# Patient Record
Sex: Male | Born: 1940 | Race: White | Hispanic: No | Marital: Married | Smoking: Former smoker
Health system: Southern US, Community
[De-identification: ages and names within clinical notes are randomized; demographics above are authoritative.]

## PROBLEM LIST (undated history)

## (undated) DIAGNOSIS — R0683 Snoring: Secondary | ICD-10-CM

## (undated) DIAGNOSIS — I483 Typical atrial flutter: Secondary | ICD-10-CM

## (undated) DIAGNOSIS — I452 Bifascicular block: Secondary | ICD-10-CM

## (undated) DIAGNOSIS — Z87442 Personal history of urinary calculi: Secondary | ICD-10-CM

## (undated) DIAGNOSIS — E663 Overweight: Secondary | ICD-10-CM

## (undated) DIAGNOSIS — I2510 Atherosclerotic heart disease of native coronary artery without angina pectoris: Secondary | ICD-10-CM

## (undated) DIAGNOSIS — Z951 Presence of aortocoronary bypass graft: Secondary | ICD-10-CM

## (undated) DIAGNOSIS — E785 Hyperlipidemia, unspecified: Secondary | ICD-10-CM

## (undated) DIAGNOSIS — I340 Nonrheumatic mitral (valve) insufficiency: Secondary | ICD-10-CM

## (undated) DIAGNOSIS — I351 Nonrheumatic aortic (valve) insufficiency: Secondary | ICD-10-CM

## (undated) DIAGNOSIS — Z9889 Other specified postprocedural states: Secondary | ICD-10-CM

## (undated) DIAGNOSIS — E119 Type 2 diabetes mellitus without complications: Secondary | ICD-10-CM

## (undated) DIAGNOSIS — I350 Nonrheumatic aortic (valve) stenosis: Secondary | ICD-10-CM

## (undated) DIAGNOSIS — I10 Essential (primary) hypertension: Secondary | ICD-10-CM

## (undated) DIAGNOSIS — D509 Iron deficiency anemia, unspecified: Secondary | ICD-10-CM

## (undated) HISTORY — PX: CATARACT EXTRACTION: SUR2

## (undated) HISTORY — DX: Snoring: R06.83

## (undated) HISTORY — DX: Type 2 diabetes mellitus without complications: E11.9

## (undated) HISTORY — DX: Overweight: E66.3

## (undated) HISTORY — DX: Iron deficiency anemia, unspecified: D50.9

## (undated) HISTORY — DX: Atherosclerotic heart disease of native coronary artery without angina pectoris: I25.10

## (undated) HISTORY — DX: Hyperlipidemia, unspecified: E78.5

## (undated) HISTORY — PX: ANKLE SURGERY: SHX546

## (undated) HISTORY — DX: Typical atrial flutter: I48.3

## (undated) HISTORY — PX: CORONARY ANGIOPLASTY: SHX604

## (undated) HISTORY — DX: Essential (primary) hypertension: I10

## (undated) HISTORY — DX: Bifascicular block: I45.2

---

## 2003-02-21 ENCOUNTER — Observation Stay (HOSPITAL_COMMUNITY): Admission: RE | Admit: 2003-02-21 | Discharge: 2003-02-22 | Payer: Self-pay | Admitting: Cardiology

## 2003-02-21 ENCOUNTER — Encounter: Payer: Self-pay | Admitting: Cardiology

## 2003-09-06 ENCOUNTER — Encounter: Admission: RE | Admit: 2003-09-06 | Discharge: 2003-09-06 | Payer: Self-pay | Admitting: Podiatry

## 2004-04-28 ENCOUNTER — Ambulatory Visit: Payer: Self-pay | Admitting: Cardiology

## 2006-06-02 ENCOUNTER — Ambulatory Visit: Payer: Self-pay | Admitting: Cardiology

## 2006-06-06 ENCOUNTER — Encounter: Payer: Self-pay | Admitting: Cardiology

## 2006-06-06 ENCOUNTER — Ambulatory Visit: Payer: Self-pay

## 2006-10-28 ENCOUNTER — Encounter: Payer: Self-pay | Admitting: Cardiology

## 2007-02-17 ENCOUNTER — Ambulatory Visit: Payer: Self-pay | Admitting: Cardiology

## 2008-04-11 ENCOUNTER — Ambulatory Visit: Payer: Self-pay | Admitting: Cardiology

## 2008-04-11 DIAGNOSIS — I2510 Atherosclerotic heart disease of native coronary artery without angina pectoris: Secondary | ICD-10-CM

## 2008-04-11 DIAGNOSIS — Z9861 Coronary angioplasty status: Secondary | ICD-10-CM

## 2008-04-11 DIAGNOSIS — I10 Essential (primary) hypertension: Secondary | ICD-10-CM | POA: Insufficient documentation

## 2008-04-11 DIAGNOSIS — E785 Hyperlipidemia, unspecified: Secondary | ICD-10-CM

## 2008-04-22 ENCOUNTER — Encounter: Payer: Self-pay | Admitting: Cardiology

## 2009-05-30 ENCOUNTER — Ambulatory Visit: Payer: Self-pay | Admitting: Cardiology

## 2009-05-30 DIAGNOSIS — I4510 Unspecified right bundle-branch block: Secondary | ICD-10-CM

## 2009-06-09 ENCOUNTER — Encounter: Payer: Self-pay | Admitting: Cardiology

## 2009-08-07 ENCOUNTER — Encounter: Payer: Self-pay | Admitting: Cardiology

## 2009-08-27 ENCOUNTER — Encounter: Payer: Self-pay | Admitting: Cardiology

## 2009-09-09 ENCOUNTER — Encounter: Payer: Self-pay | Admitting: Cardiology

## 2010-05-29 ENCOUNTER — Ambulatory Visit
Admission: RE | Admit: 2010-05-29 | Discharge: 2010-05-29 | Payer: Self-pay | Source: Home / Self Care | Attending: Cardiology | Admitting: Cardiology

## 2010-06-09 NOTE — Assessment & Plan Note (Signed)
Summary: 1 yr fu per dec reminder-srs   Visit Type:  Follow-up Primary Provider:  Dr. Sherryll Burger  CC:  follow-up visit.  History of Present Illness: the patient is a 70 year old male with a history of coronary artery disease status-post accident x 2 in October of 2004.  He had a normal stress echocardiogram in January of 2008.  He reports no chest pain shortness of breath orthopnea PND.  He reports no palpitations.  He reports occasional dizzy spells lasting two to 3 seconds.  This occurs rarely.  He reports no presyncope or syncope.Marland Kitchen  He has no other cardiovascular complaints.   Preventive Screening-Counseling & Management  Alcohol-Tobacco     Smoking Status: quit     Year Quit: 1886  Current Medications (verified): 1)  Lipitor 80 Mg Tabs (Atorvastatin Calcium) .... One Tablet P.o. Nightly 2)  Chlorthalidone 25 Mg Tabs (Chlorthalidone) .... One Tablet P.o. Daily 3)  Multivitamin .... One Tablet P.o. Daily 4)  Vitamin C .... One Tablet P.o. Daily 5)  Calcium2 50 Mg .... One Tablet P.o. Daily 6)  Aspirin 81 Mg .... One Tablet P.o. Daily 7)  Plavix 75 Mg Tabs (Clopidogrel Bisulfate) .... One Tablet P.o. Daily 8)  Lisinopril 20 Mg Tabs (Lisinopril) .... One Tablet P.o. Daily 9)  Fish Oil .... As Directed 10)  Carvedilol 6.25 Mg Tabs (Carvedilol) .... One Tablet P.o. B.i.d. 11)  Pa Resveratrol 250 Mg Caps (Resveratrol) .... Take 1 Tablet By Mouth Once A Day 12)  Krill Oil 1000 Mg Caps (Krill Oil) .... Take 1 Tablet By Mouth Once A Day 13)  Vitamin B-12 2500 Mcg Subl (Cyanocobalamin) .... Take 1 Tablet By Mouth Once A Day 14)  Vitamin D3 1000 Unit Caps (Cholecalciferol) .... Take 5 Tablet By Mouth Once A Day 15)  Garlic Oil 1000 Mg Caps (Garlic) .... Take 1 Tablet By Mouth Once A Day 16)  Cyto-Q Max 100 Mg/ml Liqd (Ubiquinol Liposomal) .... Take 1 Tablet By Mouth Once A Day  Allergies (verified): No Known Drug Allergies  Comments:  Nurse/Medical Assistant: The patient's medications and  allergies were reviewed with the patient and were updated in the Medication and Allergy Lists. List reviewed.  Past History:  Past Medical History: Last updated: 05/07/2008 coronary artery disease status post Taxus stent x 2 in Cypher drug alluding stent to the right coronary artery in October 2004 residual disease of the circumflex coronary artery disease at 70% normal LV systolic function negative exercise stress echocardiographic study January 2008 dyslipidemia hypertension  Family History: Last updated: May 07, 2008 father died from MI at age 28 mother is alive and well brother died at age 68 from leukemia  Social History: Last updated: 07-May-2008 patient is married he is a remote tobacco history.  He quit 15 years ago.  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  Risk Factors: Smoking Status: quit (05/30/2009)  Social History: Smoking Status:  quit  Review of Systems  The patient denies fatigue, malaise, fever, weight gain/loss, vision loss, decreased hearing, hoarseness, chest pain, palpitations, shortness of breath, prolonged cough, wheezing, sleep apnea, coughing up blood, abdominal pain, blood in stool, nausea, vomiting, diarrhea, heartburn, incontinence, blood in urine, muscle weakness, joint pain, leg swelling, rash, skin lesions, headache, fainting, dizziness, depression, anxiety, enlarged lymph nodes, easy bruising or bleeding, and environmental allergies.    Vital Signs:  Patient profile:   70 year old male Height:      71 inches Weight:      245 pounds BMI:  34.29 Pulse rate:   84 / minute BP sitting:   131 / 84  (left arm) Cuff size:   large  Vitals Entered By: Carlye Grippe (May 30, 2009 9:44 AM)  Nutrition Counseling: Patient's BMI is greater than 25 and therefore counseled on weight management options. CC: follow-up visit   Physical Exam  Additional Exam:  General: Well-developed, well-nourished in no  distress head: Normocephalic and atraumatic eyes PERRLA/EOMI intact, conjunctiva and lids normal nose: No deformity or lesions mouth normal dentition, normal posterior pharynx neck: Supple, no JVD.  No masses, thyromegaly or abnormal cervical nodes lungs: Normal breath sounds bilaterally without wheezing.  Normal percussion heart: regular rate and rhythm with normal S1 and S2, no S3 or S4.  PMI is normal.  No pathological murmurs abdomen: Normal bowel sounds, abdomen is soft and nontender without masses, organomegaly or hernias noted.  No hepatosplenomegaly musculoskeletal: Back normal, normal gait muscle strength and tone normal pulsus: Pulse is normal in all 4 extremities Extremities: No peripheral pitting edema neurologic: Alert and oriented x 3 skin: Intact without lesions or rashes cervical nodes: No significant adenopathy psychologic: Normal affect    EKG  Procedure date:  05/30/2009  Findings:      sinus rhythm left axis deviation and right bundle branch block.  Possible old inferior wall infarct pattern.  Heart rate 82 beats/min.  Impression & Recommendations:  Problem # 1:  PERCUTANEOUS TRANSLUMINAL CORONARY ANGIOPLASTY, HX OF (ICD-V45.82) the patient has no recurrent chest pain.  He will continue with risk factor modification.  He will also continue on aspirin and Plavix.  I recommended a repeat stress echocardiogram in one year.  Problem # 2:  DYSLIPIDEMIA (ICD-272.4) followed by the patient's primary care physician.  The patient on high-dose Lipitor. His updated medication list for this problem includes:    Lipitor 80 Mg Tabs (Atorvastatin calcium) ..... One tablet p.o. nightly  Problem # 3:  ESSENTIAL HYPERTENSION, BENIGN (ICD-401.1) blood pressures well controlled on his medical regimen.  I have made no changes. The following medications were removed from the medication list:    Metoprolol Succinate 25 Mg Xr24h-tab (Metoprolol succinate)    Metoprolol Tartrate 25  Mg Tabs (Metoprolol tartrate) .Marland Kitchen... Take 1/2 tablet by mouth twice a day His updated medication list for this problem includes:    Chlorthalidone 25 Mg Tabs (Chlorthalidone) ..... One tablet p.o. daily    Lisinopril 20 Mg Tabs (Lisinopril) ..... One tablet p.o. daily    Carvedilol 6.25 Mg Tabs (Carvedilol) ..... One tablet p.o. b.i.d.  Orders: EKG w/ Interpretation (93000)  Problem # 4:  RBBB (ICD-426.4) Assessment: Unchanged  The following medications were removed from the medication list:    Metoprolol Succinate 25 Mg Xr24h-tab (Metoprolol succinate)    Metoprolol Tartrate 25 Mg Tabs (Metoprolol tartrate) .Marland Kitchen... Take 1/2 tablet by mouth twice a day His updated medication list for this problem includes:    Plavix 75 Mg Tabs (Clopidogrel bisulfate) ..... One tablet p.o. daily    Lisinopril 20 Mg Tabs (Lisinopril) ..... One tablet p.o. daily    Carvedilol 6.25 Mg Tabs (Carvedilol) ..... One tablet p.o. b.i.d.  Patient Instructions: 1)  Your physician recommends that you continue on your current medications as directed. Please refer to the Current Medication list given to you today. 2)  Follow up in  1 year.

## 2010-06-11 NOTE — Assessment & Plan Note (Signed)
Summary: 1 yr ful   Visit Type:  Follow-up Primary Provider:  Dr. Sherryll Burger   History of Present Illness: the patient is a 70 year old male with prior history of coronary artery disease. The patient underwent stress testing in his primary care physician's office approximately 6 months ago. Had an echocardiogram done which was within normal limits. Had Cardiolite stress study with normal ejection fraction and no ischemia. The patient has been doing well. He reports no chest pain shortness of breath orthopnea PND. Has no palpitations or syncope. He still taking revasterol, but told him that the research behind this is fraudulent. We will decide what is going to continue to take that. From a cardiovascular standpoint dissection quite well.  Preventive Screening-Counseling & Management  Alcohol-Tobacco     Smoking Status: quit     Year Quit: 1984  Current Medications (verified): 1)  Lipitor 80 Mg Tabs (Atorvastatin Calcium) .... One Tablet P.o. Nightly 2)  Chlorthalidone 25 Mg Tabs (Chlorthalidone) .... One Tablet P.o. Daily 3)  Multivitamin .... (Foward Plus)one Tablet P.o. Daily 4)  Aspirin 81 Mg .... One Tablet P.o. Daily 5)  Plavix 75 Mg Tabs (Clopidogrel Bisulfate) .... One Tablet P.o. Daily 6)  Lisinopril 20 Mg Tabs (Lisinopril) .... One Tablet P.o. Daily 7)  Fish Oil 1000 Mg Caps (Omega-3 Fatty Acids) .... Take 1 Tablet By Mouth Once A Day 8)  Carvedilol 6.25 Mg Tabs (Carvedilol) .... One Tablet P.o. B.i.d. 9)  Resveratrol 100 Mg Caps (Resveratrol) .... Take 1 Tablet By Mouth Once A Day 10)  Krill Oil 1000 Mg Caps (Krill Oil) .... Take 1 Tablet By Mouth Once A Day 11)  Vitamin D3 5000 Unit Caps (Cholecalciferol) .... Take 1 Tablet By Mouth Once A Day 12)  Garlic Oil 1000 Mg Caps (Garlic) .... Take 1 Tablet By Mouth Once A Day 13)  Coq10 100 Mg Caps (Coenzyme Q10) .... Take 1 Tablet By Mouth Once A Day 14)  Glucose Essentials .... Take 1 Tablet By Mouth Three Times A Day 15)  Chelated  Potassium 99 Mg Tabs (Potassium) .... Take 1 Tablet By Mouth Once A Day 16)  Ubiquinol  Powd (Ubiquinol) .... 100mg  Take 1 Tablet By Mouth Once A Day 17)  Astaxanthin 4 Mg Caps (Astaxanthin) .... 5mg  Take 1 Tablet By Mouth Once A Day 18)  Cinnamon 500 Mg Caps (Cinnamon) .... Take 1 Tablet By Mouth Once A Day  Allergies (verified): No Known Drug Allergies  Comments:  Nurse/Medical Assistant: The patient's medication list and allergies were reviewed with the patient and were updated in the Medication and Allergy Lists.  Past History:  Past Medical History: Last updated: Apr 13, 2008 coronary artery disease status post Taxus stent x 2 in Cypher drug alluding stent to the right coronary artery in October 2004 residual disease of the circumflex coronary artery disease at 70% normal LV systolic function negative exercise stress echocardiographic study January 2008 dyslipidemia hypertension  Family History: Last updated: Apr 13, 2008 father died from MI at age 42 mother is alive and well brother died at age 15 from leukemia  Social History: Last updated: 13-Apr-2008 patient is married he is a remote tobacco history.  He quit 15 years ago.  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    Vital Signs:  Patient profile:   70 year old male Height:      71 inches Weight:      250 pounds BMI:      34.99 Pulse rate:   86 / minute BP sitting:   118 / 81  (left arm) Cuff size:   large  Vitals Entered By: Carlye Grippe (May 29, 2010 1:56 PM)  Nutrition Counseling: Patient's BMI is greater than 25 and therefore counseled on weight management options.  Physical Exam  Additional Exam:  General: Well-developed, well-nourished in no distress head: Normocephalic and atraumatic eyes PERRLA/EOMI intact, conjunctiva and lids normal nose: No deformity or lesions mouth normal dentition, normal posterior pharynx neck: Supple, no JVD.  No masses, thyromegaly or abnormal cervical nodes lungs: Normal breath sounds bilaterally without wheezing.  Normal percussion heart: regular rate and rhythm with normal S1 and S2, no S3 or S4.  PMI is normal.  No pathological murmurs abdomen: Normal bowel sounds, abdomen is soft and nontender without masses, organomegaly or hernias noted.  No hepatosplenomegaly musculoskeletal: Back normal, normal gait muscle strength and tone normal pulsus: Pulse is normal in all 4 extremities Extremities: No peripheral pitting edema neurologic: Alert and oriented x 3 skin: Intact without lesions or rashes cervical nodes: No significant adenopathy psychologic: Normal affect    Impression & Recommendations:  Problem # 1:  PERCUTANEOUS TRANSLUMINAL CORONARY ANGIOPLASTY, HX OF (ICD-V45.82) prior coronary intervention many years ago. The patient has no recurrent chest pain. Had a stress test 6 months ago showed no ischemia. Continue his medical regimen. The patient will remain iindefinitely on Plavix  Problem # 2:  DYSLIPIDEMIA (ICD-272.4) the patient is on high-dose statin therapy. His lipid panel is followed by primary care physician. His updated medication list for this problem includes:    Lipitor 80 Mg Tabs (Atorvastatin calcium) ..... One tablet p.o. nightly  Problem # 3:  RBBB (ICD-426.4) Assessment: Comment Only stable. His updated medication list for this  problem includes:    Plavix 75 Mg Tabs (Clopidogrel bisulfate) ..... One tablet p.o. daily    Lisinopril 20 Mg Tabs (Lisinopril) ..... One tablet p.o. daily    Carvedilol 6.25 Mg Tabs (Carvedilol) ..... One tablet p.o. b.i.d.  Patient Instructions: 1)  Your physician recommends that you continue on your current medications as directed. Please refer to the Current Medication list given to you today. 2)  Follow up in  1 year

## 2010-07-06 ENCOUNTER — Encounter (HOSPITAL_COMMUNITY): Payer: Medicare Other | Attending: Ophthalmology

## 2010-07-06 ENCOUNTER — Other Ambulatory Visit: Payer: Self-pay | Admitting: Ophthalmology

## 2010-07-06 DIAGNOSIS — Z01812 Encounter for preprocedural laboratory examination: Secondary | ICD-10-CM | POA: Insufficient documentation

## 2010-07-06 DIAGNOSIS — Z01810 Encounter for preprocedural cardiovascular examination: Secondary | ICD-10-CM | POA: Insufficient documentation

## 2010-07-06 LAB — BASIC METABOLIC PANEL
BUN: 19 mg/dL (ref 6–23)
Chloride: 100 mEq/L (ref 96–112)
Potassium: 3.8 mEq/L (ref 3.5–5.1)

## 2010-07-06 LAB — HEMOGLOBIN AND HEMATOCRIT, BLOOD: HCT: 43.3 % (ref 39.0–52.0)

## 2010-07-16 ENCOUNTER — Encounter (HOSPITAL_COMMUNITY): Payer: Medicare Other

## 2010-07-20 ENCOUNTER — Ambulatory Visit (HOSPITAL_COMMUNITY)
Admission: RE | Admit: 2010-07-20 | Discharge: 2010-07-20 | Disposition: A | Discharge status: Home / Self Care | Payer: Medicare Other | Source: Ambulatory Visit | Attending: Ophthalmology | Admitting: Ophthalmology

## 2010-07-20 DIAGNOSIS — Z01812 Encounter for preprocedural laboratory examination: Secondary | ICD-10-CM | POA: Insufficient documentation

## 2010-07-20 DIAGNOSIS — Z01818 Encounter for other preprocedural examination: Secondary | ICD-10-CM | POA: Insufficient documentation

## 2010-07-20 DIAGNOSIS — Z7982 Long term (current) use of aspirin: Secondary | ICD-10-CM | POA: Insufficient documentation

## 2010-07-20 DIAGNOSIS — Z79899 Other long term (current) drug therapy: Secondary | ICD-10-CM | POA: Insufficient documentation

## 2010-07-20 DIAGNOSIS — H2510 Age-related nuclear cataract, unspecified eye: Secondary | ICD-10-CM | POA: Insufficient documentation

## 2010-07-20 DIAGNOSIS — I10 Essential (primary) hypertension: Secondary | ICD-10-CM | POA: Insufficient documentation

## 2010-07-20 LAB — GLUCOSE, CAPILLARY: Glucose-Capillary: 137 mg/dL — ABNORMAL HIGH (ref 70–99)

## 2010-09-22 NOTE — Assessment & Plan Note (Signed)
[facility]                          [facility] OFFICE NOTE   NAME:Christian Rasmussen, Christian Rasmussen                          MRN:          [id number]  DATE:02/17/2007                            DOB:          1940-08-13    HISTORY OF PRESENT ILLNESS:  The patient is a middle-aged male with a  history of coronary artery disease status post stent placement to right  coronary artery with 2 Taxus stents and a Cypher drug-eluting stent in  October 2004.  He has residual disease of the circumflex coronary  artery.  The patient underwent a stress echocardiographic study earlier  this year which was within normal limits.  The patient stated that he is  doing well.  He has no shortness of breath, orthopnea, PND, has no  palpitations or syncope.  He does remain hypertensive and last office  visit, we added lisinopril 20 mg a day to his medical regimen.  He also  had concerns about his elevated CRP.  This was initially measured by  [facility] at 7.  We then repeated his CRP, and it was now measured at  0.47.  I suspect this was a superius result.   His lipid panel has been within target range.   MEDICATIONS:  1. Metoprolol ER 25 mg p.o. daily.  2. Lipitor 80 mg p.o. daily.  3. Fish oil 1 g p.o. daily.  4. Chlorthalidone 25 mg p.o. daily.  5. Multivitamin daily.  6. Vitamin C.  7. Calcium.  8. Aspirin 81 mg p.o. daily.  9. Plavix 75 mg p.o. daily.  10.Lisinopril 20 mg 1/2 tab p.o. daily.  11.Co-Enzyme Q10, 200 mg p.o. daily.   PHYSICAL EXAMINATION:  VITAL SIGNS:  Blood pressure is 150/76; heart  rate is 69; weight is 240 pounds.  NECK:  Normal carotid upstroke.  No carotid bruits.  LUNGS:  Clear breath sounds bilaterally.  HEART:  Regular rate and rhythm.  Normal S1, S2.  No murmurs, rubs, or  gallops.  ABDOMEN:  Soft, nontender, no rebound or guarding.  Good bowel sounds.  EXTREMITIES:  No cyanosis, clubbing, or edema.  NEUROLOGIC:  The patient is alert, oriented, and  grossly nonfocal.   PROBLEM LIST:  1. Coronary artery diseased.      a.     Status post Taxus stent x2 and Cypher drug-eluting stent to       right coronary artery in October 2004.      b.     Residual disease of the circumflex coronary artery disease       70%.      c.     Normal left ventricular systolic function.      d.     Negative exercise stress echocardiographic study, January       2008.  2. Dyslipidemia with good control.  3. Hypertension, poorly controlled.   PLAN:  1. The patient's lisinopril will be increased up from 10 to 20 mg p.o.      daily.  2. We reviewed his CRP, and this is within normal range.  3. The patient will  follow up with [place] in 1 year.  I have refilled his      metoprolol ER today.     Learta Codding, MD,FACC  Electronically Signed    GED/MedQ  DD: 02/17/2007  DT: 02/18/2007  Job #: [id number]   cc:   Kirstie Peri, MD

## 2010-09-25 NOTE — Assessment & Plan Note (Signed)
[facility]                          [facility] NOTE   NAME:Christian Rasmussen, Christian Rasmussen                          MRN:          [id number]  DATE:06/02/2006                            DOB:          11-19-1940    REFERRING PHYSICIAN:  Kirstie Peri, MD   HISTORY OF PRESENT ILLNESS:  The patient is a white male with known  coronary artery disease. The patient is status post stent placement to  the right coronary artery with 2 TAXUS stents and a Cypher drug-eluting  stent October 2004. The patient also has residual disease in the  circumflex coronary artery approximately 70%. The patient has been doing  well. He reports no substernal chest pain, shortness of breath,  orthopnea or PND. He has gained some weight of approximately 10 pounds  since last visit. His blood pressure also has been poorly controlled and  is currently 160/92. He denies any palpitations or syncope.   MEDICATIONS:  1. Toprol XL 25 mg p.o. daily.  2. Lipitor 80 mg a day.  3. Fish oil 1 gram daily.  4. Chlorthalidone 25 mg p.o. daily.  5. Multivitamin.  6. Vitamin C.  7. Calcium.  8. Aspirin 81 mg a day.  9. Plavix 75 mg p.o. daily.   PHYSICAL EXAMINATION:  VITAL SIGNS: Blood pressure 160/92, heart rate  68, weight is 245 pounds.  NECK: Normal carotid upstroke. No carotid bruits.  LUNGS:  Clear breath sounds bilaterally.  HEART: Regular rate and rhythm. Normal S1, S2. No murmur, rubs or  gallops.  ABDOMEN: Soft.  EXTREMITIES: No cyanosis, clubbing or edema.  NEURO: The patient is alert, oriented and grossly nonfocal.   PROBLEM LIST:  1. Coronary artery disease.      a.     Status post TAXUS stent x2 and Cypher drug-eluting stent to       right coronary artery October 2004.      b.     Residual disease circumflex coronary artery 70%.      c.     Normal left ventricular systolic function.  2. Dyslipidemia, but under good control. Total cholesterol 120, LDL is      66.  3.  Hypertension, poorly controlled.   PLAN:  1. The patient has been given lisinopril 10 mg a day to add to his      antihypertensive regimen.  2. A BMET will be drawn in 1 week.  3. The patient will also switch to generic Zocor. I have given a      prescription for simvastatin 80 mg p.o. daily.  4. It has been 3 years since the patient had his last ischemia      evaluation. I have rescheduled him for stress echocardiographic      study in our [place] office.  5. The patient will followup with me in 6 months.     Learta Codding, MD,FACC  Electronically Signed    GED/MedQ  DD: 06/02/2006  DT: 06/02/2006  Job #: [id number]   cc:   Kirstie Peri, MD

## 2010-09-25 NOTE — H&P (Signed)
NAME:  Christian Rasmussen, Christian Rasmussen                            ACCOUNT NO.:  [id number]   MEDICAL RECORD NO.:  [id number]                   PATIENT TYPE:  OIB   LOCATION:                                       FACILITY:  [facility]   PHYSICIAN:  Learta Codding, M.D.                 DATE OF BIRTH:  1941-04-30   DATE OF ADMISSION:  02/21/2003  DATE OF DISCHARGE:                                HISTORY & PHYSICAL   CHIEF COMPLAINT:  Chest pain.   HISTORY OF PRESENT ILLNESS:  This is a pleasant 70 year old male who was  referred to Dr. Andee Lineman from Dr. Sherryll Burger in [place], [place], after the  patient had an abnormal Cardiolite stress test performed on February 20, 2003.  The patient had been experiencing intermittent exertional chest pain  for approximately one month, which was consistent with angina.  The patient  has significant risk factors for coronary artery disease, including a  positive family history of hypertension and elevated lipids.  The patient  was seen in the office today by myself and Dr. Andee Lineman.  It was felt that  cardiac catheterization was indicated.  The risks and benefits were  discussed with the patient and his wife and they wanted to proceed.   PAST MEDICAL HISTORY:  The patient has a history of  hypertension and  elevated cholesterol levels.   ALLERGIES:  No known drug allergies.   CURRENT MEDICATIONS:  1. Verapamil SR 240 mg daily.  2. Chlorthalidone 25 mg daily.  3. Lipitor 10 mg at bedtime.  4. Enteric-coated aspirin 325 mg daily.  5. Nitroglycerin p.r.n. for chest pain.   FAMILY HISTORY:  The patient's father died at age 56 from an MI.  His mother  is alive and well.  He has a brother who died at age 5 from leukemia.   SOCIAL HISTORY:  The patient is married.  They have children.  He has a  remote tobacco history.  He quit 15 years ago.  He uses alcohol  occasionally.  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   PHYSICAL EXAMINATION:  GENERAL APPEARANCE:  A pleasant,  well-developed, well-  nourished, 70 year old, white male in no acute distress.  VITAL SIGNS:  Blood pressure 140/90, pulse 78.  HEENT:  Unremarkable, except for some eschar and stitches in his right ear.  He recently had a traumatic injury to his right ear.  NECK:  No bruits.  No jugular venous distention.  HEART:  Regular rate and rhythm without murmur.  LUNGS:  Clear.  ABDOMEN:  Obese, soft, and nontender.  EXTREMITIES:  Pulses intact without edema.  The right groin pulse is strong  without bruit.  SKIN:  Warm and dry.   LABORATORY DATA:  Labs are currently pending.  An EKG performed today  reveals sinus rhythm, rate 78 beats per minute, with mild ST segment  depression and  nonspecific ST-T wave abnormalities.   IMPRESSION:  1. Exertional chest pain.  2. Abnormal exercise Cardiolite performed today.  3. History of hypertension.  4. History of elevated cholesterol.  5. Remote tobacco history.  6. Positive family history of coronary artery disease.  7. Recent trauma to the right ear.   PLAN:  Admit the patient to [facility] for a cardiac  catheterization.  Labs will be performed at [facility]  The patient was  allowed to go home from the office prior to his procedure as it was felt  that his symptoms were stable at this point.  He was given a prescription  for nitroglycerin and placed on Imdur.  Told to report to the emergency room  if he had any significant chest pain prior to his catheterization.      Delton See, P.A. [facility]                  Learta Codding, M.D.    DR/MEDQ  D:  02/20/2003  T:  02/20/2003  Job:  [id number]   cc:   Short Stay Unit   Dr. Janene Madeira Internal Medicine Service

## 2010-09-25 NOTE — Cardiovascular Report (Signed)
NAME:  Christian Rasmussen, Christian Rasmussen                             ACCOUNT NO.:  [id number]   MEDICAL RECORD NO.:  [id number]                   PATIENT TYPE:  OIB   LOCATION:  6526                                 FACILITY:  [facility]   PHYSICIAN:  Charlies Constable, M.D.                  DATE OF BIRTH:  September 05, 1940   DATE OF PROCEDURE:  02/21/2003  DATE OF DISCHARGE:                              CARDIAC CATHETERIZATION   CLINICAL HISTORY:  Christian Rasmussen is 70 years old and has had recent onset of  angina.  Dr. Clelia Croft ordered a Cardiolite scan on him which was done in our  office and was markedly positive showing inferior ischemia.  He was seen by  Dr. Andee Lineman and arrangements were made for him to come in for  catheterization.   PROCEDURE:  The procedure was performed via the right femoral artery using  arterial sheath and 6 French preformed coronary catheters.  A frontal wall  arterial puncture was performed and Omnipaque contrast was used.  After  completion of the diagnostic study, made a decision to proceed with  intervention on the right coronary artery lesions.   The patient was consented and enrolled in the Steeple trial and randomized  to 0.5 mg/kg of Lovenox.  We chose to use Integrilin.  The vessel was  calcified with bends and so we used an AL1 guiding catheter 6 [place] with  side holes.  We used a PT2 light support wire.  We crossed the lesion with  the wire without too much difficulty.  We predilated the lesion in the  proximal and distal right coronary with a 3.0 x 15-mm Quantum Maverick  performing one inflation of 10 atmospheres to 30 seconds at each lesion.  We  then deployed a 3.0 x 20-mm Taxus stent in the distal lesion deploying this  with one inflation of 16 atmospheres for 30 seconds.  We then deployed a 3.5  x 8-mm Cypher stent in the proximal lesion deploying this with one inflation  of 15 atmospheres for 30 seconds.  At this point, we made a decision to  stent the lesion in the mid vessel which  looks somewhat worse.  We used a  3.0 x 16-mm Taxus stent and deployed this with on inflation of 15  atmospheres for 30 seconds.  Repeat diagnostic studies were then performed  through the guiding catheter.  The patient tolerated the procedure well and  left the laboratory in satisfactory condition.  We had some difficulty with  the guiding catheter because it went in further than we would like into the  lesion and we had to adjust the guiding catheter position during the  intervention.   RESULTS:  Left main coronary artery:  The left main coronary had a 30%  distal stenosis.   The left anterior descending artery:  The left anterior descending artery  was heavily calcified.  It gave rise to a large diagonal branch, septal  perforator, small diagonal branch and a third diagonal branch.  There was 60-  70% narrowing in the proximal to mid vessel with moderate calcification.   Circumflex artery:  The circumflex artery gave rise to a marginal branch and  a posterior lateral branch.  There was a long area of diffuse disease in the  mid to distal vessel before the posterior lateral branch with focal  narrowing of 70% in two areas.   Right coronary artery:  The right coronary artery is a moderately large  dominant vessel.  It gave rise to a posterior descending branch and two  posterior lateral branches.  There was 95% stenosis in the proximal vessel.  There was 80% stenosis in the mid vessel.  There was 90% stenosis in the  distal vessel.  There was 50% ostial stenosis in the posterior descending  branch.  There was TIMI-2 flow distally.   LEFT VENTRICULOGRAM:  The left ventriculogram performed in the RAO  projection showed hypokinesis of the inferobasal wall.  The overall wall  motion was good with an estimated ejection fraction of 55%.   Following stenting of the lesion in the distal right coronary artery, the  stenosis improved from 90% to O%.  Following stenting of the lesion in the   proximal right coronary artery, the stenosis improved from 95% to 0%.  Following stenting of the lesion in the mid right coronary artery, the  stenosis improved from 80% to 0%.   CONCLUSIONS:  1. Severe coronary artery disease with 30% narrowing of the left main     coronary artery, 60-70% narrowing in the proximal left anterior     descending, 70% narrowing in the distal circumflex artery, 95%  proximal,     80% mid and 90% distal stenosis in the right coronary artery with     inferior wall hypokinesis.  2. Successful placement of tandem overlying stents in the proximal, mid and     distal right coronary artery with improvement in proximal stenosis from     95% to 0% with a Cypher stent, improvement in the mid stenosis from 80%     to 0% with a Taxus stent and improvement in the distal stenosis from 90%     to 0% with a Taxus stent.   DISPOSITION:  The patient was sent to the post anesthesia unit for further  observation. The lesion in the distal circumflex artery I think we can  manage medically.                                               Charlies Constable, M.D.    BB/MEDQ  D:  02/21/2003  T:  02/22/2003  Job:  [id number]   cc:   Loralyn Freshwater  [facility]  Dept of Radiation [facility] [place], [place] [address]  Fax: [phone]   Cardiopulmonary Lab

## 2010-09-25 NOTE — Discharge Summary (Signed)
NAME:  Christian Rasmussen, Christian Rasmussen                             ACCOUNT NO.:  [id number]   MEDICAL RECORD NO.:  [id number]                   PATIENT TYPE:  OIB   LOCATION:  6526                                 FACILITY:  [facility]   PHYSICIAN:  Christian Rasmussen, M.D.                 DATE OF BIRTH:  Sep 06, 1940   DATE OF ADMISSION:  02/21/2003  DATE OF DISCHARGE:  02/22/2003                           DISCHARGE SUMMARY - REFERRING   PROCEDURE:  Coronary artery stenting (CYPHER), February 21, 2003.   REASON FOR ADMISSION:  Please refer to dictated admission note.   LABORATORY DATA:  WBC 12.8, HGB 13.5, platelets 305,000 at discharge.  Sodium 137, potassium 3.5, glucose 99, BUN 14, creatinine 0.9 at discharge.  Troponin I 0.77 (postop #1).   Admission chest x-ray:  No acute disease.   HOSPITAL COURSE:  The patient presented for elective coronary angiography,  performed by Dr. Charlies Constable (see report for full details), following a  recent abnormal stress test performed at our [facility] office.  The patient  was referred to Dr. Learta Rasmussen and arrangements were made for elective  coronary angiography.   Cardiac catheterization revealed significant three-vessel coronary artery  disease with preserved left ventricular function.   Dr. Charlies Constable proceeded with successful stenting (CYPHER) of three RCA  lesions (95% proximal, 80% mid, 90% distal), all to 0% residual stenosis.  There were no noted complications.   The patient was kept for overnight observation and was hemodynamically  stable the following morning, with no complaint of chest pain.  However,  postop, troponin I (0.77) was elevated.  Dr. Andee Lineman ordered a repeat  troponin I level and, if trending downward, arrangements were made for the  patient to be discharged home.   Dr. Andee Lineman recommended a repeat exercise stress Cardiolite in six months.   MEDICATION ADJUSTMENTS -- THIS ADMISSION:  Substitution of Toprol 25 mg  daily for verapamil,  addition of Plavix (at least six months), up-titration  of Lipitor, addition of fish oil.   DISCHARGE MEDICATIONS:  1. Toprol-XL 25 mg daily.  2. Plavix 75 mg daily (at least six months).  3. Coated aspirin 325 mg daily.  4. Lipitor 80 mg daily.  5. Fish oil 1 g daily.  6. Chlorthalidone 25 mg daily.  7. Nitrostat 0.4 mg p.r.n.   INSTRUCTIONS:  1. Stop verapamil.  2. No heavy lifting/driving x2 days; maintain low-fat/-cholesterol diet and     refer to either the [facility] or Mediterranean Diet for     recommendations.  3. Call office if there is any swelling/bleeding in the groin.   FOLLOWUP:  Arrangements will be made for the patient to follow up with Dr.  Cloyde Reams R. Elise Benne, P.A. in one week at the [facility].   Patient will be followed by the [facility] Team regarding his  enrollment in the STEEPLE Trial.   DISCHARGE  DIAGNOSES:  1. Multivessel coronary artery disease.     a. Status post stent (CYPHER), right coronary artery (sinus ring),        February 21, 2003.     b. Preserved left ventricular function.     c. Abnormal exercise stress Cardiolite (pre-catheterization).     d. Postoperative troponin I elevation.  2. Dyslipidemia.  3. Hypertension.  4. Remote tobacco.      Christian Rasmussen, P.A. [facility]                      Christian Rasmussen, M.D.    GS/MEDQ  D:  02/22/2003  T:  02/22/2003  Job:  [id number]   cc:   [address], [address], [place], [place]  [address] [facility]

## 2010-11-23 ENCOUNTER — Other Ambulatory Visit: Payer: Self-pay | Admitting: *Deleted

## 2010-11-23 MED ORDER — CLOPIDOGREL BISULFATE 75 MG PO TABS: 75.0000 mg | ORAL_TABLET | Freq: Every day | ORAL | Status: DC

## 2010-12-22 ENCOUNTER — Other Ambulatory Visit: Payer: Self-pay | Admitting: *Deleted

## 2010-12-22 MED ORDER — CLOPIDOGREL BISULFATE 75 MG PO TABS: 75.0000 mg | ORAL_TABLET | Freq: Every day | ORAL | Status: DC

## 2011-03-12 ENCOUNTER — Other Ambulatory Visit: Payer: Self-pay | Admitting: *Deleted

## 2011-03-12 MED ORDER — LISINOPRIL 20 MG PO TABS: 20.0000 mg | ORAL_TABLET | Freq: Every day | ORAL | Status: DC

## 2011-03-24 ENCOUNTER — Other Ambulatory Visit: Payer: Self-pay | Admitting: *Deleted

## 2011-03-24 MED ORDER — CARVEDILOL 6.25 MG PO TABS: 6.2500 mg | ORAL_TABLET | Freq: Two times a day (BID) | ORAL | Status: DC

## 2011-07-02 ENCOUNTER — Encounter: Payer: Self-pay | Admitting: Cardiology

## 2011-07-02 ENCOUNTER — Other Ambulatory Visit: Payer: Self-pay | Admitting: Cardiology

## 2011-07-02 ENCOUNTER — Ambulatory Visit (INDEPENDENT_AMBULATORY_CARE_PROVIDER_SITE_OTHER): Payer: Medicare Other | Admitting: Cardiology

## 2011-07-02 VITALS — BP 134/81 | HR 75 | Ht 71.0 in | Wt 244.0 lb

## 2011-07-02 DIAGNOSIS — E041 Nontoxic single thyroid nodule: Secondary | ICD-10-CM

## 2011-07-02 DIAGNOSIS — I2510 Atherosclerotic heart disease of native coronary artery without angina pectoris: Secondary | ICD-10-CM

## 2011-07-02 DIAGNOSIS — I10 Essential (primary) hypertension: Secondary | ICD-10-CM

## 2011-07-02 DIAGNOSIS — I452 Bifascicular block: Secondary | ICD-10-CM

## 2011-07-02 DIAGNOSIS — E785 Hyperlipidemia, unspecified: Secondary | ICD-10-CM

## 2011-07-02 MED ORDER — CARVEDILOL 6.25 MG PO TABS: 6.2500 mg | ORAL_TABLET | Freq: Two times a day (BID) | ORAL | Status: DC

## 2011-07-02 NOTE — Patient Instructions (Signed)
   Thyroid ultrasound  If the results of your test are normal or stable, you will receive a letter.  If they are abnormal, the nurse will contact you by phone. Your physician wants you to follow up in:  1 year.  You will receive a reminder letter in the mail one-two months in advance.  If you don't receive a letter, please call our office to schedule the follow up appointment

## 2011-07-03 ENCOUNTER — Encounter: Payer: Self-pay | Admitting: Cardiology

## 2011-07-03 DIAGNOSIS — I452 Bifascicular block: Secondary | ICD-10-CM | POA: Insufficient documentation

## 2011-07-03 DIAGNOSIS — E785 Hyperlipidemia, unspecified: Secondary | ICD-10-CM | POA: Insufficient documentation

## 2011-07-03 DIAGNOSIS — I2510 Atherosclerotic heart disease of native coronary artery without angina pectoris: Secondary | ICD-10-CM | POA: Insufficient documentation

## 2011-07-03 DIAGNOSIS — I10 Essential (primary) hypertension: Secondary | ICD-10-CM | POA: Insufficient documentation

## 2011-07-03 NOTE — Progress Notes (Signed)
Christian Bottoms, MD, [facility] Board Certified in Adult Cardiovascular Medicine,Internal Medicine and Critical Care Medicine    CC: followup patient with history of coronary artery disease  HPI:  The patient is a 71 year old male with a prior history of percutaneous coronary intervention stenting to the right coronary artery 2004.  Followup Cardiolite in 2010 showed no ischemia.  From a cardiac perspective he has been doing well.  He reports no chest pain.  He has no shortness of breath orthopnea or PND he has no palpitations or syncope.  He actually does not report any cardiovascular-related complications.  He reports that his mother died earlier this year, but has no overt complaints of depression or difficulty handling the situation.  His sleep pattern is normal.  PMH: reviewed and listed in Problem List in Electronic Records (and see below) Past Medical History  Diagnosis Date  . CAD (coronary artery disease)     status post Taxus stent patency of RCA 2004, Cardiolite negative for ischemia in 2010.  Marland Kitchen Dyslipidemia   . Hypertension   . Right bundle branch block (RBBB) with left anterior hemiblock    No past surgical history on file.  Allergies/SH/FHX : available in Electronic Records for review  No Known Allergies History   Social History  . Marital Status: Married    Spouse Name: N/A    Number of Children: N/A  . 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 Smoking status: Former Smoker -- 1.0 packs/day for 28 years    Types: Cigarettes    Quit date: 05/10/1985  . Smokeless tobacco: Never Used  . Alcohol Use: Yes  . Drug Use: Not on file  . Sexually Active: Not on file   Other Topics Concern  . Not on file   Social History Narrative  . No narrative on file   Family History  Problem Relation Age of Onset  . Other Father 73    Died from MI.  . Other Mother     alive & well  . Leukemia Brother  48    died    Medications: Current Outpatient Prescriptions  Medication Sig Dispense Refill  . aspirin 81 MG tablet Take 81 mg by mouth daily.      . Astaxanthin 4 MG CAPS Take 1 tablet by mouth daily.      Marland Kitchen atorvastatin (LIPITOR) 80 MG tablet Take 80 mg by mouth daily.      . carvedilol (COREG) 6.25 MG tablet Take 1 tablet (6.25 mg total) by mouth 2 (two) times daily with a meal.  180 tablet  3  . chlorthalidone (HYGROTON) 25 MG tablet Take 25 mg by mouth daily.      . Cholecalciferol (VITAMIN D-3) 5000 UNITS TABS Take 1 tablet by mouth daily.      . Cinnamon 500 MG capsule Take 500 mg by mouth daily.      . clopidogrel (PLAVIX) 75 MG tablet Take 1 tablet (75 mg total) by mouth daily.  90 tablet  3  . Coenzyme Q10 (CO Q10) 100 MG CAPS Take 1 tablet by mouth daily.      . fish oil-omega-3 fatty acids 1000 MG capsule Take 2 g by mouth daily.      . Garlic Oil 1000 MG CAPS Take by mouth.      Marland Kitchen KRILL OIL 1000 MG CAPS Take by mouth.      Marland Kitchen lisinopril (  PRINIVIL,ZESTRIL) 20 MG tablet Take 1 tablet (20 mg total) by mouth daily.  90 tablet  3  . Multiple Vitamin (MULITIVITAMIN WITH MINERALS) TABS Take 1 tablet by mouth daily.        ROS: No nausea or vomiting. No fever or chills.No melena or hematochezia.No bleeding.No claudication  Physical Exam: BP 134/81  Pulse 75  Ht 5\' 11"  (1.803 m)  Wt 244 lb (110.678 kg)  BMI 34.03 kg/m2  SpO2 96% General:Well-nourished white male Neck:noticed some asymmetry in the thyroid gland left side possibly a little bit larger.  Palpation of the gland reveals a possible nodule on the left side.  No carotid bruits.  JVD is 5-6 cm Lungs:clear breath sounds bilaterally without wheezing Cardiac:regular rate and rhythm with normal S1 and S2 and no murmur rubs or gallops Vascular:no edema.  Normal distal pulses Skin:warm and dry Physcologic:normal affect  12lead ZHY:QMVHQI sinus rhythm, right bundle branch block with left anterior hemiblock.  Otherwise no  acute changes Limited bedside ECHO:N/A   Patient Active Problem List  Diagnoses  . DYSLIPIDEMIA  . CORONARY ATHEROSCLEROSIS NATIVE CORONARY ARTERY  . PERCUTANEOUS TRANSLUMINAL CORONARY ANGIOPLASTY, HX OF-negative Cardiolite study 2010 for ischemia  . Right bundle branch block (RBBB) with left anterior hemiblock  . Hypertension  . CAD (coronary artery disease)status post Taxus stent x3 2004 Possible left sided thyroid nodule    PLAN    I ordered a thyroid ultrasound as there is a possible small nodule on examination of the thyroid gland, left side.  From a cardiovascular perspective the patient is doing well.  He will continue on Plavix.  He has no symptoms and therefore no indication for stress testing which was actually performed in 2010.  Continue risk modification and therapeutic lifestyle changes.

## 2011-07-12 ENCOUNTER — Encounter: Payer: Self-pay | Admitting: *Deleted

## 2011-07-13 ENCOUNTER — Telehealth: Payer: Self-pay | Admitting: *Deleted

## 2011-07-13 NOTE — Telephone Encounter (Signed)
Message left on voice mail - had thyroid scan last week & would like call on cell phone regarding test results.  Returned call - notifed of below.  Will fax info to PMD Sherryll Burger).    Notes Recorded by Hoover Brunette, LPN on 05/15/1094 at 2:50 PM Patient notified of results by letter. ------  Notes Recorded by Peyton Bottoms, MD on 07/09/2011 at 4:32 PM Normal exam no significant nodules

## 2011-07-29 ENCOUNTER — Ambulatory Visit: Payer: No Typology Code available for payment source | Admitting: Cardiology

## 2012-01-19 ENCOUNTER — Other Ambulatory Visit: Payer: Self-pay | Admitting: Cardiology

## 2012-06-07 ENCOUNTER — Other Ambulatory Visit: Payer: Self-pay | Admitting: Cardiology

## 2012-09-07 ENCOUNTER — Encounter: Payer: Self-pay | Admitting: Cardiology

## 2012-09-07 ENCOUNTER — Ambulatory Visit (INDEPENDENT_AMBULATORY_CARE_PROVIDER_SITE_OTHER): Payer: Medicare Other | Admitting: Cardiology

## 2012-09-07 VITALS — BP 155/78 | HR 57 | Ht 71.0 in | Wt 248.0 lb

## 2012-09-07 DIAGNOSIS — I4892 Unspecified atrial flutter: Secondary | ICD-10-CM

## 2012-09-07 DIAGNOSIS — I2510 Atherosclerotic heart disease of native coronary artery without angina pectoris: Secondary | ICD-10-CM

## 2012-09-07 DIAGNOSIS — I452 Bifascicular block: Secondary | ICD-10-CM

## 2012-09-07 DIAGNOSIS — R5383 Other fatigue: Secondary | ICD-10-CM

## 2012-09-07 DIAGNOSIS — R5381 Other malaise: Secondary | ICD-10-CM

## 2012-09-07 MED ORDER — APIXABAN 5 MG PO TABS: 5.0000 mg | ORAL_TABLET | Freq: Two times a day (BID) | ORAL | Status: DC

## 2012-09-07 NOTE — Patient Instructions (Addendum)
Your physician recommends that you schedule a follow-up appointment in: 1 month. Your physician has recommended you make the following change in your medication: Stop aspirin. Stop plavix. Start eliquis 5 mg by mouth twice daily. All other medications will remain the same. Your new prescription has been sent to your pharmacy. Your physician has requested that you have an echocardiogram. Echocardiography is a painless test that uses sound waves to create images of your heart. It provides your doctor with information about the size and shape of your heart and how well your heart's chambers and valves are working. This procedure takes approximately one hour. There are no restrictions for this procedure. Your physician has recommended that you wear a 24 holter monitor. Holter monitors are medical devices that record the heart's electrical activity. Doctors most often use these monitors to diagnose arrhythmias. Arrhythmias are problems with the speed or rhythm of the heartbeat. The monitor is a small, portable device. You can wear one while you do your normal daily activities. This is usually used to diagnose what is causing palpitations/syncope (passing out). Your physician recommends that you return for lab work today at [facility] for [facility] and TSH.

## 2012-09-07 NOTE — Progress Notes (Signed)
HPI The patient presents for evaluation routinely of his known coronary disease. He incidentally is found to be in flutter today. He's never had this diagnosis before. In fact he was at [facility] at the end of last year for an ankle replacement. He had some nonspecific changes on his EKG and I was able to review multiple hospital records through Care Everywhere.  However, there was no mention of flutter. She's never had this diagnosis as far as he knows. He doesn't notice any palpitations and hasn't had any presyncope or syncope. He hasn't had any chest pressure, neck or arm discomfort. He did have obviously significant limitations following his left ankle replacement but has been back on his feet now for several weeks. He has noticed some dyspnea on exertion. He has not been describing PND or orthopnea. He's not been describing new edema. He did have some weight gain but only a few pounds with his rehabilitation.  No Known Allergies  Current Outpatient Prescriptions  Medication Sig Dispense Refill  . aspirin 81 MG tablet Take 81 mg by mouth daily.      Marland Kitchen atorvastatin (LIPITOR) 80 MG tablet Take 40 mg by mouth daily.       . carvedilol (COREG) 6.25 MG tablet TAKE 1 TABLET (6.25 MG TOTAL) BY MOUTH 2 (TWO) TIMES DAILY WITH A MEAL.  180 tablet  0  . chlorthalidone (HYGROTON) 25 MG tablet Take 25 mg by mouth daily.      . Cinnamon 500 MG capsule Take 500 mg by mouth daily.      . clopidogrel (PLAVIX) 75 MG tablet TAKE 1 TABLET BY MOUTH EVERY DAY  90 tablet  3  . Coenzyme Q10 (CO Q10) 100 MG CAPS Take 1 tablet by mouth daily.      . fish oil-omega-3 fatty acids 1000 MG capsule Take 2 g by mouth daily.      . Garlic Oil 1000 MG CAPS Take by mouth.      Marland Kitchen KRILL OIL 1000 MG CAPS Take by mouth.      Marland Kitchen lisinopril (PRINIVIL,ZESTRIL) 20 MG tablet Take 1 tablet (20 mg total) by mouth daily.  90 tablet  3  . Multiple Vitamin (MULITIVITAMIN WITH MINERALS) TABS Take 1 tablet by mouth daily.       No current  facility-administered medications for this visit.    Past Medical History  Diagnosis Date  . CAD (coronary artery disease)     status post Taxus stent patency of RCA 2004, Cardiolite negative for ischemia in 2010.  Marland Kitchen Dyslipidemia   . Hypertension   . Right bundle branch block (RBBB) with left anterior hemiblock     No past surgical history on file.  ROS: PHYSICAL EXAM BP 155/78  Pulse 57  Ht 5\' 11"  (1.803 m)  Wt 248 lb (112.492 kg)  BMI 34.6 kg/m2 GENERAL:  Well appearing HEENT:  Pupils equal round and reactive, fundi not visualized, oral mucosa unremarkable NECK:  No jugular venous distention, waveform within normal limits, carotid upstroke brisk and symmetric, no bruits, no thyromegaly LYMPHATICS:  No cervical, inguinal adenopathy LUNGS:  Clear to auscultation bilaterally BACK:  No CVA tenderness CHEST:  Unremarkable HEART:  PMI not displaced or sustained,S1 and S2 within normal limits, no S3, no S4, no clicks, no rubs, apical high pitched short systolic murmur nonradiating, no diastolic murmurs ABD:  Flat, positive bowel sounds normal in frequency in pitch, no bruits, no rebound, no guarding, no midline pulsatile mass, no hepatomegaly, no splenomegaly  EXT:  2 plus pulses throughout, mild bilateral ankle left greater than right edema, no cyanosis no clubbing SKIN:  No rashes no nodules NEURO:  Cranial nerves II through XII grossly intact, motor grossly intact throughout PSYCH:  Cognitively intact, oriented to person place and time   EKG:  Atrial flutter with variable conduction, left axis deviation, incomplete right bundle branch block, premature ectopic complex.  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 I spent quite a bit of time reviewing this with the patient.  He he seems to have reasonable rate control but I will put on a 24-hour Holter.  Mr. KNOXX BOEDING has a CHA2DS2 - VASc score of 3 with a risk of stroke of 3.2%  and a HAS - BLED  score of 2 with a low risk of bleeding.  Therefore, anticoagulation is indicated. I will be stopping his aspirin and his Plavix.  I will start him on Eliquis 5 mg bid.  I will check a TSH and some routine labs today. I will check an echocardiogram. Ultimately he will need cardioversion and perhaps eventually flutter ablation.  CAD:  He has no ongoing ischemia though with his dyspnea I will have a low threshold for stress perfusion testing in the future.  MURMUR:  He said some very mild MR on echo and aortic sclerosis. I suspect the murmur indicates sclerosis but we'll check this on the echocardiogram.  DYSPNEA:  This will be evaluated as above.   (Greater than 40 minutes reviewing all data with greater than 50% face to face with the patient).

## 2012-09-14 ENCOUNTER — Other Ambulatory Visit (INDEPENDENT_AMBULATORY_CARE_PROVIDER_SITE_OTHER): Payer: Medicare Other

## 2012-09-14 ENCOUNTER — Ambulatory Visit (INDEPENDENT_AMBULATORY_CARE_PROVIDER_SITE_OTHER): Payer: Medicare Other | Admitting: *Deleted

## 2012-09-14 ENCOUNTER — Other Ambulatory Visit: Payer: Self-pay

## 2012-09-14 DIAGNOSIS — I452 Bifascicular block: Secondary | ICD-10-CM

## 2012-09-14 DIAGNOSIS — I4892 Unspecified atrial flutter: Secondary | ICD-10-CM

## 2012-09-14 DIAGNOSIS — I2510 Atherosclerotic heart disease of native coronary artery without angina pectoris: Secondary | ICD-10-CM

## 2012-09-25 ENCOUNTER — Telehealth: Payer: Self-pay | Admitting: *Deleted

## 2012-09-25 NOTE — Telephone Encounter (Signed)
Patient informed. 

## 2012-09-25 NOTE — Telephone Encounter (Signed)
Message copied by Eustace Moore on Mon Sep 25, 2012 10:43 AM ------      Message from: Rollene Rotunda      Created: Sun Sep 24, 2012 11:18 AM       Mild aortic sclerosis.  EF OK.  Plan as outlined in the note. Call Mr. Lovins with the results and send results to [facility], MD       ------

## 2012-10-16 ENCOUNTER — Encounter: Payer: Self-pay | Admitting: Cardiology

## 2012-10-16 ENCOUNTER — Ambulatory Visit (INDEPENDENT_AMBULATORY_CARE_PROVIDER_SITE_OTHER): Payer: Medicare Other | Admitting: Cardiology

## 2012-10-16 VITALS — BP 130/76 | HR 79 | Ht 71.0 in | Wt 250.0 lb

## 2012-10-16 DIAGNOSIS — I4892 Unspecified atrial flutter: Secondary | ICD-10-CM

## 2012-10-16 NOTE — Patient Instructions (Addendum)
Your physician recommends that you continue on your current medications as directed. Please refer to the Current Medication list given to you today. You have been referred to Dr. Johney Frame.

## 2012-10-16 NOTE — Progress Notes (Signed)
HPI The patient presents for evaluation and followup of atrial flutter. This was noted in my last appointment with him. I placed him on Eliquis.  Routine labs were unremarkable. Echocardiography to evaluate his flutter and murmur demonstrated some mild aortic stenosis. He had a normal ejection fraction. He had a Holter which confirmed persistent flutter without this with good rate control. He was recovering from ankle surgery when I last saw him. He is doing much better with this. He was having some dyspnea but this seems to be improved as he is increasing his activity. He denies any chest pressure, neck or arm discomfort. He's not noticing any palpitations, presyncope or syncope. He has no PND or orthopnea. He does still have some mild ankle edema left greater than right.  No Known Allergies  Current Outpatient Prescriptions  Medication Sig Dispense Refill  . apixaban (ELIQUIS) 5 MG TABS tablet Take 1 tablet (5 mg total) by mouth 2 (two) times daily.  60 tablet  3  . atorvastatin (LIPITOR) 80 MG tablet Take 40 mg by mouth daily.       . carvedilol (COREG) 6.25 MG tablet TAKE 1 TABLET (6.25 MG TOTAL) BY MOUTH 2 (TWO) TIMES DAILY WITH A MEAL.  180 tablet  0  . chlorthalidone (HYGROTON) 25 MG tablet Take 25 mg by mouth daily.      . Coenzyme Q10 (CO Q10) 100 MG CAPS Take 1 tablet by mouth daily.      . fish oil-omega-3 fatty acids 1000 MG capsule Take 2 g by mouth daily.      . Garlic Oil 1000 MG CAPS Take by mouth.      Marland Kitchen KRILL OIL 1000 MG CAPS Take by mouth.      Marland Kitchen lisinopril (PRINIVIL,ZESTRIL) 20 MG tablet Take 1 tablet (20 mg total) by mouth daily.  90 tablet  3  . Multiple Vitamin (MULITIVITAMIN WITH MINERALS) TABS Take 1 tablet by mouth daily.      Marland Kitchen RA KRILL OIL 500 MG CAPS Take by mouth.       No current facility-administered medications for this visit.    Past Medical History  Diagnosis Date  . CAD (coronary artery disease)     status post Taxus stent patency of RCA 2004,  Cardiolite negative for ischemia in 2010.  Marland Kitchen Dyslipidemia   . Hypertension   . Right bundle branch block (RBBB) with left anterior hemiblock     No past surgical history on file.  ROS:  As stated in the HPI and negative for all other systems.  PHYSICAL EXAM BP 130/76  Pulse 79  Ht 5\' 11"  (1.803 m)  Wt 250 lb (113.399 kg)  BMI 34.88 kg/m2 GENERAL:  Well appearing NECK:  No jugular venous distention, waveform within normal limits, carotid upstroke brisk and symmetric, no bruits, no thyromegaly LUNGS:  Clear to auscultation bilaterally BACK:  No CVA tenderness CHEST:  Unremarkable HEART:  PMI not displaced or sustained,S1 and S2 within normal limits, no S3, no clicks, no rubs, apical high pitched short systolic murmur nonradiating, no diastolic murmurs, irregular ABD:  Flat, positive bowel sounds normal in frequency in pitch, no bruits, no rebound, no guarding, no midline pulsatile mass, no hepatomegaly, no splenomegaly EXT:  2 plus pulses throughout, mild bilateral ankle left greater than right edema, no cyanosis no clubbing   EKG:  Atrial flutter with variable conduction, left axis deviation, incomplete right bundle branch block, premature ectopic complex.  10/16/2012   ASSESSMENT AND PLAN  ATRIAL FLUTTER:   [name] has a CHA2DS2 - VASc score of 3 with a risk of stroke of 3.2%  and a HAS - BLED score of 2 with a low risk of bleeding.  Therefore, anticoagulation is indicated. He is tolerating Eliquis 5 mg bid.  I put a Holter on him he had good rate control and it was flutter the entire time. I will send him to see Dr. Johney Frame to consider ablation.  CAD: The patient has no new sypmtoms.  No further cardiovascular testing is indicated.  We will continue with aggressive risk reduction and meds as listed.  MURMUR:  He had some very mild AS. I reviewed the echo with him. No change in therapy or further imaging is indicated.  DYSPNEA:  This is improved.  No further imaging is  indicated.

## 2012-11-09 ENCOUNTER — Encounter (HOSPITAL_COMMUNITY): Payer: Self-pay | Admitting: Pharmacy Technician

## 2012-11-09 ENCOUNTER — Encounter: Payer: Self-pay | Admitting: *Deleted

## 2012-11-09 ENCOUNTER — Encounter: Payer: Self-pay | Admitting: Internal Medicine

## 2012-11-09 ENCOUNTER — Ambulatory Visit (INDEPENDENT_AMBULATORY_CARE_PROVIDER_SITE_OTHER): Payer: Medicare Other | Admitting: Internal Medicine

## 2012-11-09 VITALS — BP 150/77 | HR 62 | Ht 71.0 in | Wt 249.6 lb

## 2012-11-09 DIAGNOSIS — I4892 Unspecified atrial flutter: Secondary | ICD-10-CM | POA: Insufficient documentation

## 2012-11-09 DIAGNOSIS — R0683 Snoring: Secondary | ICD-10-CM

## 2012-11-09 DIAGNOSIS — R0989 Other specified symptoms and signs involving the circulatory and respiratory systems: Secondary | ICD-10-CM

## 2012-11-09 DIAGNOSIS — R0609 Other forms of dyspnea: Secondary | ICD-10-CM

## 2012-11-09 LAB — BASIC METABOLIC PANEL
CO2: 25 mEq/L (ref 19–32)
Calcium: 9.6 mg/dL (ref 8.4–10.5)
Creatinine, Ser: 0.7 mg/dL (ref 0.4–1.5)
Glucose, Bld: 212 mg/dL — ABNORMAL HIGH (ref 70–99)

## 2012-11-09 LAB — CBC WITH DIFFERENTIAL/PLATELET
Basophils Absolute: 0 10*3/uL (ref 0.0–0.1)
Eosinophils Absolute: 0.2 10*3/uL (ref 0.0–0.7)
Lymphocytes Relative: 6.5 % — ABNORMAL LOW (ref 12.0–46.0)
MCHC: 34.5 g/dL (ref 30.0–36.0)
Neutrophils Relative %: 87.5 % — ABNORMAL HIGH (ref 43.0–77.0)
RBC: 4.78 Mil/uL (ref 4.22–5.81)
RDW: 15 % — ABNORMAL HIGH (ref 11.5–14.6)

## 2012-11-09 NOTE — Patient Instructions (Addendum)
Your physician has recommended that you have an ablation. Catheter ablation is a medical procedure used to treat some cardiac arrhythmias (irregular heartbeats). During catheter ablation, a long, thin, flexible tube is put into a blood vessel in your groin (upper thigh), or neck. This tube is called an ablation catheter. It is then guided to your heart through the blood vessel. Radio frequency waves destroy small areas of heart tissue where abnormal heartbeats may cause an arrhythmia to start. Please see the instruction sheet given to you today.    Your physician has recommended that you have a sleep study. This test records several body functions during sleep, including: brain activity, eye movement, oxygen and carbon dioxide blood levels, heart rate and rhythm, breathing rate and rhythm, the flow of air through your mouth and nose, snoring, body muscle movements, and chest and belly movement.

## 2012-11-09 NOTE — Progress Notes (Signed)
Primary Care Physician: Kirstie Peri, MD Referring Physician:  Dr Alanda Amass is a 72 y.o. male with a h/o CAD who presents for EP consultation regarding recently diagnosed atrial flutter.  He presented to Dr Antoine Poche 5/14 and was found to be in atrial flutter.  He had a holter monitor placed which I have fully reviewed today which revealed only atrial flutter.  V rates were reasonably controlled. He was placed on eliquis for stroke prevention.  He was recovering from ankle surgery which he had at [facility] in January. Presently, he has cough and congestion for which he is receiving a Z pak and prednisone.  He reports SOB and fatigue with his atrial flutter.   He denies any chest pressure, neck or arm discomfort. He's not noticing any palpitations, dizziness, presyncope or syncope.   The patient is tolerating medications without difficulties and is otherwise without complaint today.   Past Medical History  Diagnosis Date  . CAD (coronary artery disease)     status post Taxus stent patency of RCA 2004, Cardiolite negative for ischemia in 2010.  Marland Kitchen Dyslipidemia   . Hypertension   . Right bundle branch block (RBBB) with left anterior hemiblock   . Typical atrial flutter   . Overweight   . Diabetes mellitus   . Snores    Past Surgical History  Procedure Laterality Date  . Ankle surgery      total ankle replacement    Current Outpatient Prescriptions  Medication Sig Dispense Refill  . apixaban (ELIQUIS) 5 MG TABS tablet Take 1 tablet (5 mg total) by mouth 2 (two) times daily.  60 tablet  3  . atorvastatin (LIPITOR) 80 MG tablet Take 40 mg by mouth daily.       . carvedilol (COREG) 6.25 MG tablet TAKE 1 TABLET (6.25 MG TOTAL) BY MOUTH 2 (TWO) TIMES DAILY WITH A MEAL.  180 tablet  0  . chlorthalidone (HYGROTON) 25 MG tablet Take 25 mg by mouth daily.      . ciprofloxacin (CIPRO) 500 MG tablet Pt unsure about dosage. Only taking for one week      . Coenzyme Q10 (CO Q10) 100 MG CAPS  Take 1 tablet by mouth daily.      . fish oil-omega-3 fatty acids 1000 MG capsule Take 2 g by mouth daily.      . Garlic Oil 1000 MG CAPS Take by mouth.      Marland Kitchen KRILL OIL 1000 MG CAPS Take by mouth.      Marland Kitchen lisinopril (PRINIVIL,ZESTRIL) 20 MG tablet Take 1 tablet (20 mg total) by mouth daily.  90 tablet  3  . Multiple Vitamin (MULITIVITAMIN WITH MINERALS) TABS Take 1 tablet by mouth daily.      . predniSONE (STERAPRED UNI-PAK) 5 MG TABS Pt unsure about dosage. Only taking for one week       No current facility-administered medications for this visit.    No Known Allergies  History   Social History  . Marital Status: Married    Spouse Name: N/A    Number of Children: N/A  . 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 Smoking status: Former Smoker -- 1.00 packs/day for 28 years    Types: Cigarettes    Quit date: 05/10/1985  . Smokeless tobacco: Never Used  . Alcohol Use: No  . Drug Use: No  . Sexually Active: Not on file  Other Topics Concern  . Not on file   Social History Narrative   Lives in [place] with spouse.   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 Other Father 76    Died from MI.  . Other Mother     alive & well  . Leukemia Brother 48    died    ROS- All systems are reviewed and negative except as per the HPI above  Physical Exam: Filed Vitals:   11/09/12 0949  BP: 150/77  Pulse: 62  Height: 5\' 11"  (1.803 m)  Weight: 249 lb 9.6 oz (113.218 kg)    GEN- The patient is well appearing, alert and oriented x 3 today.   Head- normocephalic, atraumatic Eyes-  Sclera clear, conjunctiva pink Ears- hearing intact Oropharynx- clear Neck- supple, no JVP Lymph- no cervical lymphadenopathy Lungs- Clear to ausculation bilaterally, normal work of breathing Heart- Regular rate and rhythm, no murmurs, rubs or gallops, PMI not laterally displaced GI- soft, NT,  ND, + BS Extremities- no clubbing, cyanosis, or edema MS- no significant deformity or atrophy Skin- no rash or lesion Psych- euthymic mood, full affect Neuro- strength and sensation are intact  EKG 5/14 reveals typical appearing atrial flutter Echo 5/14 reviewed  Assessment and Plan:  1. Symptomatic typical appearing atrial flutter Therapeutic strategies for atrial flutter including medicine and ablation were discussed in detail with the patient today. Risk, benefits, and alternatives to EP study and radiofrequency ablation were also discussed in detail today. These risks include but are not limited to stroke, bleeding, vascular damage, tamponade, perforation, damage to the heart and other structures, AV block requiring pacemaker, worsening renal function, and death. The patient understands these risk and wishes to proceed.  We will therefore proceed with catheter ablation at the next available time. Continue eliquis for stroke prevention  2. Snoring/ atrial enlargement Consider sleep study to evaluate for sleep apnea

## 2012-11-09 NOTE — Addendum Note (Signed)
Addended by: Dennis Bast F on: 11/09/2012 10:23 AM   Modules accepted: Orders

## 2012-11-20 ENCOUNTER — Telehealth: Payer: Self-pay | Admitting: Cardiology

## 2012-11-20 NOTE — Telephone Encounter (Signed)
New Prob     Pt wants to know if he should stop his ELIQUIS before his procedure on 7/17. Please call.

## 2012-11-20 NOTE — Telephone Encounter (Signed)
Pt and reviewed all instructions with patient.  He is aware to continue his medications as RXed and not to miss any doses of Eliquis prior to the procedure.  He states understanding

## 2012-11-23 ENCOUNTER — Ambulatory Visit (HOSPITAL_COMMUNITY): Payer: Medicare Other | Admitting: Anesthesiology

## 2012-11-23 ENCOUNTER — Encounter (HOSPITAL_COMMUNITY)
Admission: RE | Disposition: A | Discharge status: Home / Self Care | Payer: Self-pay | Source: Ambulatory Visit | Attending: Internal Medicine

## 2012-11-23 ENCOUNTER — Encounter (HOSPITAL_COMMUNITY): Payer: Self-pay | Admitting: Anesthesiology

## 2012-11-23 ENCOUNTER — Ambulatory Visit (HOSPITAL_COMMUNITY)
Admission: RE | Admit: 2012-11-23 | Discharge: 2012-11-24 | Disposition: A | Discharge status: Home / Self Care | Payer: Medicare Other | Source: Ambulatory Visit | Attending: Internal Medicine | Admitting: Internal Medicine

## 2012-11-23 DIAGNOSIS — Z9861 Coronary angioplasty status: Secondary | ICD-10-CM | POA: Insufficient documentation

## 2012-11-23 DIAGNOSIS — I4892 Unspecified atrial flutter: Secondary | ICD-10-CM | POA: Diagnosis present

## 2012-11-23 DIAGNOSIS — I452 Bifascicular block: Secondary | ICD-10-CM | POA: Insufficient documentation

## 2012-11-23 DIAGNOSIS — E119 Type 2 diabetes mellitus without complications: Secondary | ICD-10-CM | POA: Insufficient documentation

## 2012-11-23 DIAGNOSIS — E785 Hyperlipidemia, unspecified: Secondary | ICD-10-CM | POA: Insufficient documentation

## 2012-11-23 DIAGNOSIS — I10 Essential (primary) hypertension: Secondary | ICD-10-CM | POA: Insufficient documentation

## 2012-11-23 DIAGNOSIS — Z79899 Other long term (current) drug therapy: Secondary | ICD-10-CM | POA: Insufficient documentation

## 2012-11-23 DIAGNOSIS — I2510 Atherosclerotic heart disease of native coronary artery without angina pectoris: Secondary | ICD-10-CM | POA: Insufficient documentation

## 2012-11-23 DIAGNOSIS — E669 Obesity, unspecified: Secondary | ICD-10-CM | POA: Insufficient documentation

## 2012-11-23 HISTORY — PX: ABLATION: SHX5711

## 2012-11-23 HISTORY — PX: ATRIAL FLUTTER ABLATION: SHX5733

## 2012-11-23 LAB — GLUCOSE, CAPILLARY
Glucose-Capillary: 139 mg/dL — ABNORMAL HIGH (ref 70–99)
Glucose-Capillary: 112 mg/dL — ABNORMAL HIGH (ref 70–99)

## 2012-11-23 SURGERY — ATRIAL FLUTTER ABLATION
Anesthesia: Monitor Anesthesia Care

## 2012-11-23 MED ORDER — HYDROCODONE-ACETAMINOPHEN 5-325 MG PO TABS: 1.0000 | ORAL_TABLET | ORAL | Status: DC | PRN

## 2012-11-23 MED ORDER — CARVEDILOL 6.25 MG PO TABS
6.2500 mg | ORAL_TABLET | Freq: Two times a day (BID) | ORAL | Status: DC
  Administered 2012-11-23: 6.25 mg via ORAL
  Filled 2012-11-23 (×5): qty 1

## 2012-11-23 MED ORDER — PROPOFOL 10 MG/ML IV BOLUS
INTRAVENOUS | Status: DC | PRN
  Administered 2012-11-23: 150 mg via INTRAVENOUS

## 2012-11-23 MED ORDER — SODIUM CHLORIDE 0.9 % IJ SOLN: 3.0000 mL | INTRAMUSCULAR | Status: DC | PRN

## 2012-11-23 MED ORDER — ATORVASTATIN CALCIUM 40 MG PO TABS
40.0000 mg | ORAL_TABLET | Freq: Every day | ORAL | Status: DC
  Filled 2012-11-23: qty 1

## 2012-11-23 MED ORDER — SODIUM CHLORIDE 0.9 % IJ SOLN
3.0000 mL | Freq: Two times a day (BID) | INTRAMUSCULAR | Status: DC
  Administered 2012-11-23: 3 mL via INTRAVENOUS

## 2012-11-23 MED ORDER — APIXABAN 5 MG PO TABS
5.0000 mg | ORAL_TABLET | Freq: Two times a day (BID) | ORAL | Status: DC
  Administered 2012-11-23: 5 mg via ORAL
  Filled 2012-11-23 (×4): qty 1

## 2012-11-23 MED ORDER — LISINOPRIL 20 MG PO TABS
20.0000 mg | ORAL_TABLET | Freq: Every day | ORAL | Status: DC
  Filled 2012-11-23: qty 1

## 2012-11-23 MED ORDER — SODIUM CHLORIDE 0.9 % IV SOLN: 250.0000 mL | INTRAVENOUS | Status: DC | PRN

## 2012-11-23 MED ORDER — FENTANYL CITRATE 0.05 MG/ML IJ SOLN
INTRAMUSCULAR | Status: DC | PRN
  Administered 2012-11-23: 100 ug via INTRAVENOUS

## 2012-11-23 MED ORDER — ONDANSETRON HCL 4 MG/2ML IJ SOLN: 4.0000 mg | Freq: Four times a day (QID) | INTRAMUSCULAR | Status: DC | PRN

## 2012-11-23 MED ORDER — BUPIVACAINE HCL (PF) 0.25 % IJ SOLN
INTRAMUSCULAR | Status: AC
  Filled 2012-11-23: qty 30

## 2012-11-23 MED ORDER — ACETAMINOPHEN 325 MG PO TABS: 650.0000 mg | ORAL_TABLET | ORAL | Status: DC | PRN

## 2012-11-23 MED ORDER — SODIUM CHLORIDE 0.9 % IV SOLN
INTRAVENOUS | Status: DC
  Administered 2012-11-23: 10:00:00 via INTRAVENOUS

## 2012-11-23 MED ORDER — LIDOCAINE HCL (CARDIAC) 20 MG/ML IV SOLN
INTRAVENOUS | Status: DC | PRN
  Administered 2012-11-23: 100 mg via INTRAVENOUS

## 2012-11-23 MED ORDER — CIPROFLOXACIN HCL 500 MG PO TABS: 500.0000 mg | ORAL_TABLET | Freq: Two times a day (BID) | ORAL | Status: DC

## 2012-11-23 MED ORDER — MIDAZOLAM HCL 5 MG/5ML IJ SOLN
INTRAMUSCULAR | Status: DC | PRN
  Administered 2012-11-23: 2 mg via INTRAVENOUS

## 2012-11-23 MED ORDER — PHENYLEPHRINE HCL 10 MG/ML IJ SOLN
INTRAMUSCULAR | Status: DC | PRN
  Administered 2012-11-23 (×2): 80 ug via INTRAVENOUS
  Administered 2012-11-23: 40 ug via INTRAVENOUS
  Administered 2012-11-23 (×2): 80 ug via INTRAVENOUS

## 2012-11-23 MED ORDER — CHLORTHALIDONE 25 MG PO TABS
25.0000 mg | ORAL_TABLET | Freq: Every day | ORAL | Status: DC
  Filled 2012-11-23: qty 1

## 2012-11-23 MED ORDER — LACTATED RINGERS IV SOLN
INTRAVENOUS | Status: DC | PRN
  Administered 2012-11-23: 12:00:00 via INTRAVENOUS

## 2012-11-23 NOTE — H&P (View-Only) (Signed)
 Primary Care Physician: SHAH,ASHISH, MD Referring Physician:  Dr Hochrein   Christian Rasmussen is a 72 y.o. male with a h/o CAD who presents for EP consultation regarding recently diagnosed atrial flutter.  He presented to Dr Hochrein 5/14 and was found to be in atrial flutter.  He had a holter monitor placed which I have fully reviewed today which revealed only atrial flutter.  V rates were reasonably controlled. He was placed on eliquis for stroke prevention.  He was recovering from ankle surgery which he had at [facility] in January. Presently, he has cough and congestion for which he is receiving a Z pak and prednisone.  He reports SOB and fatigue with his atrial flutter.   He denies any chest pressure, neck or arm discomfort. He's not noticing any palpitations, dizziness, presyncope or syncope.   The patient is tolerating medications without difficulties and is otherwise without complaint today.   Past Medical History  Diagnosis Date  . CAD (coronary artery disease)     status post Taxus stent patency of RCA 2004, Cardiolite negative for ischemia in 2010.  . Dyslipidemia   . Hypertension   . Right bundle branch block (RBBB) with left anterior hemiblock   . Typical atrial flutter   . Overweight   . Diabetes mellitus   . Snores    Past Surgical History  Procedure Laterality Date  . Ankle surgery      total ankle replacement    Current Outpatient Prescriptions  Medication Sig Dispense Refill  . apixaban (ELIQUIS) 5 MG TABS tablet Take 1 tablet (5 mg total) by mouth 2 (two) times daily.  60 tablet  3  . atorvastatin (LIPITOR) 80 MG tablet Take 40 mg by mouth daily.       . carvedilol (COREG) 6.25 MG tablet TAKE 1 TABLET (6.25 MG TOTAL) BY MOUTH 2 (TWO) TIMES DAILY WITH A MEAL.  180 tablet  0  . chlorthalidone (HYGROTON) 25 MG tablet Take 25 mg by mouth daily.      . ciprofloxacin (CIPRO) 500 MG tablet Pt unsure about dosage. Only taking for one week      . Coenzyme Q10 (CO Q10) 100 MG CAPS  Take 1 tablet by mouth daily.      . fish oil-omega-3 fatty acids 1000 MG capsule Take 2 g by mouth daily.      . Garlic Oil 1000 MG CAPS Take by mouth.      . KRILL OIL 1000 MG CAPS Take by mouth.      . lisinopril (PRINIVIL,ZESTRIL) 20 MG tablet Take 1 tablet (20 mg total) by mouth daily.  90 tablet  3  . Multiple Vitamin (MULITIVITAMIN WITH MINERALS) TABS Take 1 tablet by mouth daily.      . predniSONE (STERAPRED UNI-PAK) 5 MG TABS Pt unsure about dosage. Only taking for one week       No current facility-administered medications for this visit.    No Known Allergies  History   Social History  . Marital Status: Married    Spouse Name: N/A    Number of Children: N/A  . 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 Smoking status: Former Smoker -- 1.00 packs/day for 28 years    Types: Cigarettes    Quit date: 05/10/1985  . Smokeless tobacco: Never Used  . Alcohol Use: No  . Drug Use: No  . Sexually Active: Not on file     Other Topics Concern  . Not on file   Social History Narrative   Lives in [place] with spouse.   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 Other Father 50    Died from MI.  . Other Mother     alive & well  . Leukemia Brother 48    died    ROS- All systems are reviewed and negative except as per the HPI above  Physical Exam: Filed Vitals:   11/09/12 0949  BP: 150/77  Pulse: 62  Height: 5' 11" (1.803 m)  Weight: 249 lb 9.6 oz (113.218 kg)    GEN- The patient is well appearing, alert and oriented x 3 today.   Head- normocephalic, atraumatic Eyes-  Sclera clear, conjunctiva pink Ears- hearing intact Oropharynx- clear Neck- supple, no JVP Lymph- no cervical lymphadenopathy Lungs- Clear to ausculation bilaterally, normal work of breathing Heart- Regular rate and rhythm, no murmurs, rubs or gallops, PMI not laterally displaced GI- soft, NT,  ND, + BS Extremities- no clubbing, cyanosis, or edema MS- no significant deformity or atrophy Skin- no rash or lesion Psych- euthymic mood, full affect Neuro- strength and sensation are intact  EKG 5/14 reveals typical appearing atrial flutter Echo 5/14 reviewed  Assessment and Plan:  1. Symptomatic typical appearing atrial flutter Therapeutic strategies for atrial flutter including medicine and ablation were discussed in detail with the patient today. Risk, benefits, and alternatives to EP study and radiofrequency ablation were also discussed in detail today. These risks include but are not limited to stroke, bleeding, vascular damage, tamponade, perforation, damage to the heart and other structures, AV block requiring pacemaker, worsening renal function, and death. The patient understands these risk and wishes to proceed.  We will therefore proceed with catheter ablation at the next available time. Continue eliquis for stroke prevention  2. Snoring/ atrial enlargement Consider sleep study to evaluate for sleep apnea    

## 2012-11-23 NOTE — Anesthesia Procedure Notes (Signed)
Procedure Name: LMA Insertion Date/Time: 11/23/2012 1:18 PM Performed by: Carmela Rima Pre-anesthesia Checklist: Patient identified, Timeout performed, Emergency Drugs available, Suction available and Patient being monitored Patient Re-evaluated:Patient Re-evaluated prior to inductionOxygen Delivery Method: Circle system utilized Preoxygenation: Pre-oxygenation with 100% oxygen Intubation Type: IV induction Ventilation: Mask ventilation without difficulty LMA: LMA inserted and LMA with gastric port inserted LMA Size: 5.0 Number of attempts: 1 Placement Confirmation: positive ETCO2 and breath sounds checked- equal and bilateral Tube secured with: Tape Dental Injury: Teeth and Oropharynx as per pre-operative assessment

## 2012-11-23 NOTE — Op Note (Signed)
PREPROCEDURE DIAGNOSIS: Typical-appearing atrial flutter.   POSTPROCEDURE DIAGNOSIS: Isthmus-dependent right atrial flutter.   PROCEDURES:  1. Comprehensive EP study.  2. Coronary sinus pacing and recording.  3. Mapping of SVT.  4. Ablation of SVT.   INTRODUCTION:  Christian Rasmussen is a 72 y.o. male with a history of symptomatic typical-appearing atrial flutter. He presents today for EP study and radiofrequency ablation.   DESCRIPTION OF THE PROCEDURE: Informed written consent was obtained, and the patient was brought to the electrophysiology lab in the fasting state. The patient was then adequately sedated with anesthesia as outlined in the anesthesia report. The patient's right groin was prepped and draped in the usual sterile fashion by the EP lab staff. Using a percutaneous Seldinger technique a 6, 7, and 8-French hemostasis sheaths were placed into the right common femoral vein. A 7- [employer] decapolar coronary sinus catheter was introduced through the right common femoral vein and advanced into the coronary sinus for recording and pacing from this location. A 6-French quadripolar Josephson catheter was introduced through the right common femoral vein and advanced into the right ventricle for recording and pacing. This catheter was then pulled back to the His bundle location. The patient presented to the electrophysiology lab in sinus rhythm. The PR interval measured 184 msec with a QRS duration of 133 msec (RBB, LAHB) and QTc 429 msec.  The average RR interval was 712 msec. The AH interval measured 90 msec with an HV interval of 53 msec.   Ventricular pacing was performed which revealed midline concentric decremental VA conduction with a VA WCL of 450 msec.  Atrial pacing was performed which revealed PR<RR.  The AVWCL was .  Multiple attempts at arrhythmia induction were made with rapid atrial pacing down to a cycle length of 200 msec with no inducible arrhythmias  observed. Given the patients clinical history of atrial flutter which was typical by ekg, I elected to perform cavotricuspid isthmus ablation today.  A [employer] 7-French  10mm ablation catheter was introduced through the right common femoral vein and advanced into the right atrium. Mapping of the cavotricuspid isthmus  was performed which revealed a standard isthmus. A series of 11 radiofrequency applications were delivered along the cavotricuspid isthmus with a target temperature of 60 degrees with power of 80 watts.  Following ablation, differential atrial pacing was performed from the low lateral right atrium which confirmed complete bidirectional cavotricuspid isthmus block with a stimulus to earliest atrial activation recorded bidirectional across the isthmus measuring 180 msec. The patient was observed without return of conduction through the isthmus.  Following ablation, the AH interval measured 87 msec with an HV interval of 46 msec. Rapid atrial pacing was performed, which revealed an AV Wenckebach cycle length of 410 msec with no evidence of PR greater than RR and no tachycardias induced when pacing down to a cycle length of 250 msec. The procedure was therefore considered completed. All catheters were removed, and the sheaths were aspirated and flushed. The sheaths were removed and hemostasis was assured. There were no early apparent complications.   CONCLUSIONS:  1. Sinus rhythm upon presentation. 2. No accessory pathways or dual av nodal physiology  3. No inducible atrial flutter today.  I elected to perform empiric CTI ablation.  Complete bidirectional cavotricuspid isthmus block was achieved.  4. No inducible arrhythmias following ablation.  5. No early apparent complications.  Fayrene Fearing Xzaria Teo,MD 11/23/2012 2:38 PM

## 2012-11-23 NOTE — Discharge Summary (Signed)
ELECTROPHYSIOLOGY PROCEDURE DISCHARGE SUMMARY    Patient ID: Christian Rasmussen,  MRN: [id number], DOB/AGE: [birth year] 72 y.o.  Admit date: 11/23/2012 Discharge date: 11/24/2012  Primary Care Physician: Kirstie Peri, MD Primary Cardiologist: Rollene Rotunda, MD Electrophysiologist: Hillis Range, MD  Primary Discharge Diagnosis:  Atrial flutter status post ablation this admission  Secondary Discharge Diagnosis:  1.  Coronary artery disease s/p Taxus stent to RCA in 2004 2.  Dyslipidemia 3.  Hypertension 4.  RBBB with left anterior hemiblock 5.  Obesity 6.  Diabetes  Procedures This Admission:  1.  Electrophysiology study and radiofrequency catheter ablation of atrial flutter on 11-23-2012 by Dr Johney Frame.  This study demonstrated sinus rhythm upon presentation; no accessory pathways or dual av nodal physiology; no inducible atrial flutter today. Empiric CTI ablation was performed with complete bidirectional cavotricuspid isthmus block was achieved. There were no inducible arrhythmias following ablation and no early apparent complications.  Brief HPI: Christian Rasmussen is a 72 y.o. male with a h/o CAD who presents for EP consultation regarding recently diagnosed atrial flutter. He presented to Dr Antoine Poche 5/14 and was found to be in atrial flutter. He had a holter monitor placed which revealed only atrial flutter. V rates were reasonably controlled. He was placed on eliquis for stroke prevention. He was recovering from ankle surgery which he had at [facility] in January.  He reports SOB and fatigue with his atrial flutter. Risks, benefits, and alternatives to ablation were reviewed with the patient who wished to proceed.  Hospital Course:  The patient was admitted and underwent electrophysiology study and radiofrequency catheter ablation by Dr Johney Frame with details as outlined above.  Telemetry demonstrated sinus rhythm.  His groin incision was without complication.  Dr Johney Frame examined the patient and  considered them stable for discharge to home.  He will be maintained on Eliquis until seen by Dr Johney Frame in follow up.   Discharge Vitals: Blood pressure 112/69, pulse 71, temperature 97.7 F (36.5 C), temperature source Oral, resp. rate 19, height 5\' 11"  (1.803 m), weight 249 lb 12.5 oz (113.3 kg), SpO2 93.00%.  Physical Exam: Filed Vitals:   11/23/12 0919 11/23/12 1700 11/23/12 2028 11/24/12 0412  BP: 157/55 144/81 132/78 112/69  Pulse: 68 82 77 71  Temp: 98.2 F (36.8 C) 97.9 F (36.6 C) 97.7 F (36.5 C) 97.7 F (36.5 C)  TempSrc: Oral Oral Oral Oral  Resp: 20 19 19 19   Height: 5\' 11"  (1.803 m) 5\' 11"  (1.803 m)    Weight: 250 lb (113.399 kg) 249 lb 12.5 oz (113.3 kg)    SpO2: 97% 93% 95% 93%    GEN- The patient is well appearing, alert and oriented x 3 today.   Head- normocephalic, atraumatic Eyes-  Sclera clear, conjunctiva pink Ears- hearing intact Oropharynx- clear Neck- supple, no JVP Lymph- no cervical lymphadenopathy Lungs- Clear to ausculation bilaterally, normal work of breathing Heart- Regular rate and rhythm, no murmurs, rubs or gallops, PMI not laterally displaced GI- soft, NT, ND, + BS Extremities- no clubbing, cyanosis, or edema, no hematoma/ bruit MS- no significant deformity or atrophy Skin- no rash or lesion Psych- euthymic mood, full affect Neuro- strength and sensation are intact  Labs:   Lab Results  Component Value Date   WBC 13.9* 11/09/2012   HGB 14.7 11/09/2012   HCT 42.6 11/09/2012   MCV 89.1 11/09/2012   PLT 430.0* 11/09/2012   No results found for this basename: NA, K, CL, CO2, BUN, CREATININE, CALCIUM, LABALBU, PROT,  BILITOT, ALKPHOS, ALT, AST, GLUCOSE,  in the last 168 hours No results found for this basename: CKTOTAL,  CKMB,  CKMBINDEX,  TROPONINI     Discharge Medications:    Medication List    STOP taking these medications       ciprofloxacin 500 MG tablet  Commonly known as:  CIPRO      TAKE these medications       apixaban 5 MG  Tabs tablet  Commonly known as:  ELIQUIS  Take 1 tablet (5 mg total) by mouth 2 (two) times daily.     ASTAXANTHIN PO  Take 10 mg by mouth daily.     atorvastatin 80 MG tablet  Commonly known as:  LIPITOR  Take 40 mg by mouth daily.     Berberine Chloride Powd  Take 1,200 mg by mouth daily.     carvedilol 6.25 MG tablet  Commonly known as:  COREG  Take 6.25 mg by mouth 2 (two) times daily with a meal.     chlorthalidone 25 MG tablet  Commonly known as:  HYGROTON  Take 25 mg by mouth daily.     Co Q10 100 MG Caps  Take 1 tablet by mouth daily.     fish oil-omega-3 fatty acids 1000 MG capsule  Take 2 g by mouth daily.     Garlic Oil 1000 MG Caps  Take 1 capsule by mouth daily.     Krill Oil 1000 MG Caps  Take 1 capsule by mouth daily.     lisinopril 20 MG tablet  Commonly known as:  PRINIVIL,ZESTRIL  Take 1 tablet (20 mg total) by mouth daily.     multivitamin with minerals Tabs  Take 1 tablet by mouth daily.     OVER THE COUNTER MEDICATION  Take 1 capsule by mouth 3 (three) times daily. Glucose Essentials- Chromium, [place] kino tree extract, Gymnema, Tursaline Extract,Vanadyl Sulfate, Banaba Extract     predniSONE 5 MG Tabs  Commonly known as:  STERAPRED UNI-PAK  Take 5-35 mg by mouth daily. Take 7 pills day 1, 6 pills day 2, and continue until gone. Starting 11/08/12        Disposition:   Future Appointments Provider Department Dept Phone   12/03/2012 8:00 PM [facility] [phone]   01/04/2013 12:15 PM Hillis Range, MD [facility] Main Office [place]) [phone]     Follow-up Information   Follow up with Hillis Range, MD On 01/04/2013. (12:15)    Contact information:   [address] [place] [address] [phone]       Duration of Discharge Encounter: Greater than 30 minutes including physician time.  Signed, Hillis Range, MD  11/24/2012, 7:53 AM

## 2012-11-23 NOTE — Interval H&P Note (Signed)
History and Physical Interval Note:  11/23/2012 11:59 AM  Christian Rasmussen  has presented today for surgery, with the diagnosis of AFlutter  The various methods of treatment have been discussed with the patient and family. After consideration of risks, benefits and other options for treatment, the patient has consented to  Procedure(s): ATRIAL FLUTTER ABLATION (N/A) as a surgical intervention .  The patient's history has been reviewed, patient examined, no change in status, stable for surgery.  I have reviewed the patient's chart and labs.  Questions were answered to the patient's satisfaction.     Hillis Range

## 2012-11-23 NOTE — Anesthesia Postprocedure Evaluation (Signed)
Anesthesia Post Note  Patient: Christian Rasmussen  Procedure(s) Performed: Procedure(s) (LRB): ATRIAL FLUTTER ABLATION (N/A)  Anesthesia type: general  Patient location: PACU  Post pain: Pain level controlled  Post assessment: Patient's Cardiovascular Status Stable  Last Vitals:  Filed Vitals:   11/23/12 0919  BP: 157/55  Pulse: 68  Temp: 36.8 C  Resp: 20    Post vital signs: Reviewed and stable  Level of consciousness: sedated  Complications: No apparent anesthesia complications

## 2012-11-23 NOTE — Anesthesia Preprocedure Evaluation (Addendum)
Anesthesia Evaluation  Patient identified by MRN, date of birth, ID band Patient awake    Reviewed: Allergy & Precautions, H&P , NPO status , Patient's Chart, lab work & pertinent test results, reviewed documented beta blocker date and time   Airway Mallampati: I TM Distance: >3 FB Neck ROM: full    Dental  (+) Teeth Intact and Dental Advidsory Given   Pulmonary          Cardiovascular hypertension, On Home Beta Blockers + CAD + dysrhythmias Atrial Fibrillation Rhythm:irregular Rate:Normal     Neuro/Psych    GI/Hepatic   Endo/Other  diabetes, Well Controlled  Renal/GU      Musculoskeletal   Abdominal   Peds  Hematology   Anesthesia Other Findings   Reproductive/Obstetrics                          Anesthesia Physical Anesthesia Plan  ASA: III  Anesthesia Plan: General   Post-op Pain Management:    Induction: Intravenous  Airway Management Planned: LMA  Additional Equipment:   Intra-op Plan:   Post-operative Plan: Extubation in OR  Informed Consent: I have reviewed the patients History and Physical, chart, labs and discussed the procedure including the risks, benefits and alternatives for the proposed anesthesia with the patient or authorized representative who has indicated his/her understanding and acceptance.   Dental Advisory Given  Plan Discussed with:   Anesthesia Plan Comments:        Anesthesia Quick Evaluation

## 2012-11-23 NOTE — Preoperative (Signed)
Beta Blockers   Reason not to administer Beta Blockers:Not Applicable 

## 2012-11-23 NOTE — Transfer of Care (Signed)
Immediate Anesthesia Transfer of Care Note  Patient: Christian Rasmussen  Procedure(s) Performed: Procedure(s): ATRIAL FLUTTER ABLATION (N/A)  Patient Location: PACU  Anesthesia Type:General  Level of Consciousness: awake, alert  and oriented  Airway & Oxygen Therapy: Patient Spontanous Breathing and Patient connected to nasal cannula oxygen  Post-op Assessment: Report given to PACU RN, Post -op Vital signs reviewed and stable and Patient moving all extremities  Post vital signs: Reviewed and stable  Complications: No apparent anesthesia complications

## 2012-11-24 DIAGNOSIS — I4892 Unspecified atrial flutter: Secondary | ICD-10-CM

## 2012-11-24 LAB — GLUCOSE, CAPILLARY: Glucose-Capillary: 130 mg/dL — ABNORMAL HIGH (ref 70–99)

## 2012-11-24 MED ORDER — ONDANSETRON HCL 4 MG/2ML IJ SOLN: 4.0000 mg | Freq: Once | INTRAMUSCULAR | Status: DC | PRN

## 2012-11-24 MED ORDER — HYDROMORPHONE HCL PF 1 MG/ML IJ SOLN: 0.2500 mg | INTRAMUSCULAR | Status: DC | PRN

## 2012-11-24 MED ORDER — OXYCODONE HCL 5 MG PO TABS: 5.0000 mg | ORAL_TABLET | Freq: Once | ORAL | Status: DC | PRN

## 2012-11-24 MED ORDER — OXYCODONE HCL 5 MG/5ML PO SOLN: 5.0000 mg | Freq: Once | ORAL | Status: DC | PRN

## 2012-11-24 MED ORDER — MEPERIDINE HCL 25 MG/ML IJ SOLN: 6.2500 mg | INTRAMUSCULAR | Status: DC | PRN

## 2012-11-24 NOTE — Progress Notes (Signed)
Discharged to home with family office visits in place teaching done  

## 2012-11-27 LAB — GLUCOSE, CAPILLARY: Glucose-Capillary: 98 mg/dL (ref 70–99)

## 2012-12-03 ENCOUNTER — Encounter (HOSPITAL_BASED_OUTPATIENT_CLINIC_OR_DEPARTMENT_OTHER): Payer: Medicare Other

## 2012-12-04 ENCOUNTER — Other Ambulatory Visit: Payer: Self-pay | Admitting: Cardiology

## 2013-01-03 ENCOUNTER — Other Ambulatory Visit: Payer: Self-pay

## 2013-01-03 MED ORDER — APIXABAN 5 MG PO TABS: 5.0000 mg | ORAL_TABLET | Freq: Two times a day (BID) | ORAL | Status: DC

## 2013-01-04 ENCOUNTER — Encounter: Payer: Self-pay | Admitting: Internal Medicine

## 2013-01-04 ENCOUNTER — Ambulatory Visit (INDEPENDENT_AMBULATORY_CARE_PROVIDER_SITE_OTHER): Payer: Medicare Other | Admitting: Internal Medicine

## 2013-01-04 VITALS — BP 156/78 | HR 76 | Ht 71.0 in | Wt 244.0 lb

## 2013-01-04 DIAGNOSIS — I2510 Atherosclerotic heart disease of native coronary artery without angina pectoris: Secondary | ICD-10-CM

## 2013-01-04 DIAGNOSIS — I4892 Unspecified atrial flutter: Secondary | ICD-10-CM

## 2013-01-04 MED ORDER — ASPIRIN EC 81 MG PO TBEC: 81.0000 mg | DELAYED_RELEASE_TABLET | Freq: Every day | ORAL | Status: DC

## 2013-01-04 NOTE — Progress Notes (Signed)
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  Since his recent atrial flutter ablation, the patient reports doing very well.  His energy is much improved.  Because he has felt better, he did not have his sleep study as instructed. Today, he denies symptoms of palpitations, chest pain, shortness of breath,  lower extremity edema, dizziness, presyncope, or syncope.  The patient is otherwise without complaint today.   Past Medical History  Diagnosis Date  . CAD (coronary artery disease)     status post Taxus stent patency of RCA 2004, Cardiolite negative for ischemia in 2010.  Marland Kitchen Dyslipidemia   . Hypertension   . Right bundle branch block (RBBB) with left anterior hemiblock   . Typical atrial flutter     s/p ablation 11-23-2012 by Dr Johney Frame  . Overweight   . Diabetes mellitus   . Snores    Past Surgical History  Procedure Laterality Date  . Ankle surgery      total ankle replacement  . Ablation  11-23-2012    s/p cavotricuspid isthmus ablation by Dr Johney Frame    Current Outpatient Prescriptions  Medication Sig Dispense Refill  . ASTAXANTHIN PO Take 10 mg by mouth daily.      Marland Kitchen atorvastatin (LIPITOR) 80 MG tablet Take 40 mg by mouth daily.       . Berberine Chloride POWD Take 1,200 mg by mouth daily.      . carvedilol (COREG) 6.25 MG tablet TAKE 1 TABLET BY MOUTH TWO TIMES DAILY WITH A MEAL  180 tablet  3  . chlorthalidone (HYGROTON) 25 MG tablet Take 25 mg by mouth daily.      . Coenzyme Q10 (CO Q10) 100 MG CAPS Take 1 tablet by mouth daily.      . fish oil-omega-3 fatty acids 1000 MG capsule Take 2 g by mouth daily.      . Garlic Oil 1000 MG CAPS Take 1 capsule by mouth daily.       Marland Kitchen KRILL OIL 1000 MG CAPS Take 1 capsule by mouth daily.       Marland Kitchen lisinopril (PRINIVIL,ZESTRIL) 20 MG tablet Take 1 tablet (20 mg total) by mouth daily.  90 tablet  3  . Multiple Vitamin (MULITIVITAMIN WITH  MINERALS) TABS Take 1 tablet by mouth daily.      Marland Kitchen OVER THE COUNTER MEDICATION Take 1 capsule by mouth 3 (three) times daily. Glucose Essentials- Chromium, [place] kino tree extract, Gymnema, Tursaline Extract,Vanadyl Sulfate, Banaba Extract      . aspirin EC 81 MG tablet Take 1 tablet (81 mg total) by mouth daily.  90 tablet  3   No current facility-administered medications for this visit.    Physical Exam: Filed Vitals:   01/04/13 1153  BP: 156/78  Pulse: 76  Height: 5\' 11"  (1.803 m)  Weight: 244 lb (110.678 kg)    GEN- The patient is well appearing, alert and oriented x 3 today.   Head- normocephalic, atraumatic Eyes-  Sclera clear, conjunctiva pink Ears- hearing intact Oropharynx- clear Lungs- Clear to ausculation bilaterally, normal work of breathing Heart- Regular rate and rhythm, no murmurs, rubs or gallops, PMI not laterally displaced GI- soft, NT, ND, + BS Extremities- no clubbing, cyanosis, or edema  ekg today reveals sinus rhythm with PACs, RBBB, LAHB  Assessment and Plan:  1. Atrial flutter Doing well s/p ablation Stop eliquis  2. CAD Stable Resume ASA  Follow-up with Dr Antoine Poche I will see as needed going forward

## 2013-01-04 NOTE — Patient Instructions (Signed)
Your physician recommends that you schedule a follow-up appointment in: 3 months in [place] with Dr Antoine Poche  Your physician has recommended you make the following change in your medication:  1) Stop Eliquis 2) Start Aspirin 81mg  daily

## 2013-02-09 ENCOUNTER — Encounter: Payer: Self-pay | Admitting: Cardiovascular Disease

## 2013-04-13 ENCOUNTER — Encounter: Payer: Self-pay | Admitting: Cardiovascular Disease

## 2013-04-13 ENCOUNTER — Ambulatory Visit (INDEPENDENT_AMBULATORY_CARE_PROVIDER_SITE_OTHER): Payer: Medicare Other | Admitting: Cardiovascular Disease

## 2013-04-13 VITALS — BP 136/72 | HR 84 | Ht 71.0 in | Wt 243.0 lb

## 2013-04-13 DIAGNOSIS — I2510 Atherosclerotic heart disease of native coronary artery without angina pectoris: Secondary | ICD-10-CM

## 2013-04-13 DIAGNOSIS — Z8679 Personal history of other diseases of the circulatory system: Secondary | ICD-10-CM

## 2013-04-13 DIAGNOSIS — I359 Nonrheumatic aortic valve disorder, unspecified: Secondary | ICD-10-CM

## 2013-04-13 DIAGNOSIS — Z9889 Other specified postprocedural states: Secondary | ICD-10-CM

## 2013-04-13 DIAGNOSIS — E785 Hyperlipidemia, unspecified: Secondary | ICD-10-CM

## 2013-04-13 DIAGNOSIS — I10 Essential (primary) hypertension: Secondary | ICD-10-CM

## 2013-04-13 DIAGNOSIS — I350 Nonrheumatic aortic (valve) stenosis: Secondary | ICD-10-CM

## 2013-04-13 NOTE — Progress Notes (Signed)
Patient ID: Christian Rasmussen, male   DOB: 1941/03/26, 72 y.o.   MRN: [id number]      SUBJECTIVE: The patient is a 72 year old male with a cardiovascular history significant for atrial flutter ablation, coronary artery disease with previous percutaneous coronary intervention, minimal aortic stenosis, hypertension and hyperlipidemia. He denies chest pain, shortness of breath, lightheadedness, dizziness, palpitations, leg swelling and syncope. He said that when he was in atrial flutter, he never experienced palpitations. He had an ankle replacement surgery performed at [facility] earlier this year. He said he is happy just to be alive, as his father passed away at 46 of a heart attack and his brother died at the age of 20, and he never knew either of his grandfathers.   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 He does not like to exercise by his own admission.   No Known Allergies  Current Outpatient Prescriptions  Medication Sig Dispense Refill  . aspirin EC 81 MG tablet Take 1 tablet (81 mg total) by mouth daily.  90 tablet  3  . ASTAXANTHIN PO Take 10 mg by mouth daily.      Marland Kitchen atorvastatin (LIPITOR) 40 MG tablet Take 1 tablet by mouth daily.      . Berberine Chloride POWD Take 1,200 mg by mouth daily.      . carvedilol (COREG) 6.25 MG tablet TAKE 1 TABLET BY MOUTH TWO TIMES DAILY WITH A MEAL  180 tablet  3  . chlorthalidone (HYGROTON) 25 MG tablet Take 25 mg by mouth daily.      . Coenzyme Q10 (CO Q10) 100 MG CAPS Take 1 tablet by mouth daily.      . fish oil-omega-3 fatty acids 1000 MG capsule Take 2 g by mouth daily.      . Garlic Oil 1000 MG CAPS Take 1 capsule by mouth daily.       Marland Kitchen KRILL OIL 1000 MG CAPS Take 1 capsule by mouth daily.       Marland Kitchen lisinopril (PRINIVIL,ZESTRIL) 20 MG tablet Take 1 tablet (20 mg total) by mouth daily.  90 tablet  3  . Multiple Vitamin (MULITIVITAMIN WITH MINERALS) TABS Take 1 tablet by mouth daily.      Marland Kitchen OVER THE  COUNTER MEDICATION Take 1 capsule by mouth 3 (three) times daily. Glucose Essentials- Chromium, [place] kino tree extract, Gymnema, Tursaline Extract,Vanadyl Sulfate, Banaba Extract       No current facility-administered medications for this visit.    Past Medical History  Diagnosis Date  . CAD (coronary artery disease)     status post Taxus stent patency of RCA 2004, Cardiolite negative for ischemia in 2010.  Marland Kitchen Dyslipidemia   . Hypertension   . Right bundle branch block (RBBB) with left anterior hemiblock   . Typical atrial flutter     s/p ablation 11-23-2012 by Dr Christian Rasmussen  . Overweight   . Diabetes mellitus   . Snores     Past Surgical History  Procedure Laterality Date  . Ankle surgery      total ankle replacement  . Ablation  11-23-2012    s/p cavotricuspid isthmus ablation by Dr Christian Rasmussen    History   Social History  . Marital Status: Married    Spouse Name: N/A    Number of Children: N/A  . 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 Smoking status:  Former Smoker -- 1.00 packs/day for 28 years    Types: Cigarettes    Quit date: 05/10/1985  . Smokeless tobacco: Never Used  . Alcohol Use: No  . Drug Use: No  . Sexual Activity: Not on file   Other Topics Concern  . Not on file   Social History Narrative   Lives in [place] with spouse.   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  Lungs: Clear to auscultation bilaterally with normal respiratory effort. CV: Nondisplaced PMI.  Heart mostly regular with periods of irregularity, normal S1/S2, no S3/S4, II/VI ejection systolic murmur at RUSB.  No peripheral edema.  No carotid bruit.  Normal pedal pulses.  Abdomen: Soft, nontender, no hepatosplenomegaly, no distention.    Neurologic: Alert and oriented x 3.  Psych: Normal affect. Extremities: No clubbing or cyanosis.   ECG: reviewed and available in electronic records. (NSR with sinus arrhythmia, RBBB, LVH with repolarization abnormality)      ASSESSMENT AND PLAN: ATRIAL FLUTTER: [name] has a CHA2DS2 - VASc score of 3 with a risk of stroke of 3.2% and a HAS - BLED score of 2 with a low risk of bleeding. Therefore, anticoagulation is indicated. He is tolerating Eliquis 5 mg bid. I put a Holter on him he had good rate control and it was flutter the entire time. I will send him to see Dr. Johney Rasmussen to consider ablation.   CAD:  The patient has no new sypmtoms. No further cardiovascular testing is indicated. We will continue with aggressive risk reduction and medications as listed, which include ASA, Lipitor, Coreg, and lisinopril.  MINIMAL AORTIC STENOSIS:  No change in therapy or further imaging is indicated.   HYPERTENSION: Controlled on present therapy.  EXERCISE: I gave him extensive counseling with regards to this, in order to attenuate his risk of future cardiac events.    Prentice Docker, M.D., F.A.C.C.

## 2013-04-13 NOTE — Patient Instructions (Signed)
Continue all current medications. Your physician wants you to follow up in: 6 months.  You will receive a reminder letter in the mail one-two months in advance.  If you don't receive a letter, please call our office to schedule the follow up appointment   

## 2013-04-13 NOTE — Addendum Note (Signed)
Addended by: Lesle Chris on: 04/13/2013 02:50 PM   Modules accepted: Orders

## 2013-05-26 ENCOUNTER — Emergency Department (HOSPITAL_COMMUNITY)
Admission: EM | Admit: 2013-05-26 | Discharge: 2013-05-26 | Disposition: A | Discharge status: Home / Self Care | Payer: Medicare Other | Attending: Emergency Medicine | Admitting: Emergency Medicine

## 2013-05-26 ENCOUNTER — Emergency Department (HOSPITAL_COMMUNITY): Payer: Medicare Other

## 2013-05-26 ENCOUNTER — Encounter (HOSPITAL_COMMUNITY): Payer: Self-pay | Admitting: Emergency Medicine

## 2013-05-26 DIAGNOSIS — I2510 Atherosclerotic heart disease of native coronary artery without angina pectoris: Secondary | ICD-10-CM | POA: Insufficient documentation

## 2013-05-26 DIAGNOSIS — E119 Type 2 diabetes mellitus without complications: Secondary | ICD-10-CM | POA: Insufficient documentation

## 2013-05-26 DIAGNOSIS — E785 Hyperlipidemia, unspecified: Secondary | ICD-10-CM | POA: Insufficient documentation

## 2013-05-26 DIAGNOSIS — Z87891 Personal history of nicotine dependence: Secondary | ICD-10-CM | POA: Insufficient documentation

## 2013-05-26 DIAGNOSIS — J111 Influenza due to unidentified influenza virus with other respiratory manifestations: Secondary | ICD-10-CM | POA: Insufficient documentation

## 2013-05-26 DIAGNOSIS — I10 Essential (primary) hypertension: Secondary | ICD-10-CM | POA: Insufficient documentation

## 2013-05-26 DIAGNOSIS — Z79899 Other long term (current) drug therapy: Secondary | ICD-10-CM | POA: Insufficient documentation

## 2013-05-26 DIAGNOSIS — E663 Overweight: Secondary | ICD-10-CM | POA: Insufficient documentation

## 2013-05-26 DIAGNOSIS — Z7982 Long term (current) use of aspirin: Secondary | ICD-10-CM | POA: Insufficient documentation

## 2013-05-26 DIAGNOSIS — R000 Tachycardia, unspecified: Secondary | ICD-10-CM | POA: Insufficient documentation

## 2013-05-26 DIAGNOSIS — J029 Acute pharyngitis, unspecified: Secondary | ICD-10-CM | POA: Insufficient documentation

## 2013-05-26 NOTE — ED Notes (Signed)
C/o cough, runny nose, body aches x 2 days.

## 2013-05-26 NOTE — ED Provider Notes (Signed)
CSN: [id number]     Arrival date & time 05/26/13  1352 History  This chart was scribed for Virgel Manifold, MD by Jenne Campus, ED Scribe. This patient was seen in room APA11/APA11 and the patient's care was started at 3:59 PM.   Chief Complaint  Patient presents with  . Generalized Body Aches    The history is provided by the patient. No language interpreter was used.    HPI Comments: Christian Rasmussen is a 73 y.o. male who presents to the Emergency Department complaining of diffuse myalgias that started this morning and have worsened since the onset with associated HA felt with head movement, mild cough, sore throat, fatigue and rhinorrhea that started yesterday. The HA is worsened with eye movement. He reports that he has tried Dayquil and another OTC medicine with some improvement. Wife reports that she has had a cough at home and their grandson has had bronchitis as well. Pt denies any emesis, diarrhea, SOB or rash. He reports that he did get an influenza vaccine this year.   Past Medical History  Diagnosis Date  . CAD (coronary artery disease)     status post Taxus stent patency of RCA 2004, Cardiolite negative for ischemia in 2010.  Marland Kitchen Dyslipidemia   . Hypertension   . Right bundle branch block (RBBB) with left anterior hemiblock   . Typical atrial flutter     s/p ablation 11-23-2012 by Dr Rayann Heman  . Overweight   . Diabetes mellitus   . Snores    Past Surgical History  Procedure Laterality Date  . Ankle surgery      total ankle replacement  . Ablation  11-23-2012    s/p cavotricuspid isthmus ablation by Dr Rayann Heman   Family History  Problem Relation Age of Onset  . Other Father 48    Died from MI.  . Other Mother     alive & well  . Leukemia Brother 28    died   History  Substance Use Topics  . Smoking status: Former Smoker -- 1.00 packs/day for 28 years    Types: Cigarettes    Quit date: 05/10/1985  . Smokeless tobacco: Never Used  . Alcohol Use: No    Review of  Systems  Constitutional: Positive for fatigue.  HENT: Positive for rhinorrhea and sore throat.   Respiratory: Positive for cough. Negative for shortness of breath.   Gastrointestinal: Negative for nausea and vomiting.  Musculoskeletal: Positive for myalgias.  Skin: Negative for rash.  All other systems reviewed and are negative.    Allergies  Review of patient's allergies indicates no known allergies.  Home Medications   Current Outpatient Rx  Name  Route  Sig  Dispense  Refill  . aspirin EC 81 MG tablet   Oral   Take 1 tablet (81 mg total) by mouth daily.   90 tablet   3   . ASTAXANTHIN PO   Oral   Take 10 mg by mouth daily.         Marland Kitchen atorvastatin (LIPITOR) 40 MG tablet   Oral   Take 1 tablet by mouth daily.         . Berberine Chloride POWD   Oral   Take 1,200 mg by mouth daily.         . carvedilol (COREG) 6.25 MG tablet   Oral   Take 6.25 mg by mouth 2 (two) times daily.         . chlorthalidone (HYGROTON) 25 MG tablet  Oral   Take 25 mg by mouth daily.         . Coenzyme Q10 (CO Q10) 100 MG CAPS   Oral   Take 1 tablet by mouth daily.         . fish oil-omega-3 fatty acids 1000 MG capsule   Oral   Take 2 g by mouth daily.         . Garlic Oil 0254 MG CAPS   Oral   Take 1 capsule by mouth daily.          Marland Kitchen KRILL OIL 1000 MG CAPS   Oral   Take 1 capsule by mouth daily.          Marland Kitchen lisinopril (PRINIVIL,ZESTRIL) 20 MG tablet   Oral   Take 1 tablet (20 mg total) by mouth daily.   90 tablet   3   . Multiple Vitamin (MULITIVITAMIN WITH MINERALS) TABS   Oral   Take 1 tablet by mouth daily.         Marland Kitchen OVER THE COUNTER MEDICATION   Oral   Take 1 capsule by mouth 3 (three) times daily. Glucose Essentials- Chromium, [place] kino tree extract, Gymnema, Tursaline Extract,Vanadyl Sulfate, Banaba Extract          Triage Vitals: BP 146/66  Pulse 97  Temp(Src) 99.4 F (37.4 C) (Oral)  Resp 20  Ht 5\' 11"  (1.803 m)  Wt 243 lb  (110.224 kg)  BMI 33.91 kg/m2  SpO2 95%  Physical Exam  Nursing note and vitals reviewed. Constitutional: He is oriented to person, place, and time. He appears well-developed and well-nourished. No distress.  Well appearing  HENT:  Head: Normocephalic and atraumatic.  Mouth/Throat: Oropharynx is clear and moist.  Eyes: EOM are normal.  Neck: Normal range of motion.  Cardiovascular: Regular rhythm, normal heart sounds and intact distal pulses.  Tachycardia present.   No murmur heard. Mildly tachy  Pulmonary/Chest: Effort normal and breath sounds normal. No respiratory distress.  Abdominal: Soft. He exhibits no distension. There is no tenderness.  Musculoskeletal: Normal range of motion.  Neurological: He is alert and oriented to person, place, and time.  Skin: Skin is warm and dry.  Psychiatric: He has a normal mood and affect. Judgment normal.    ED Course  Procedures (including critical care time)  DIAGNOSTIC STUDIES: Oxygen Saturation is 95% on RA, adequate by my interpretation.    COORDINATION OF CARE: 4:02 PM-Advised pt that his symptoms are most likely caused by influenza. Discussed treatment plan which includes symptomatic treatment (fluids, rest, NSAIDs for discomfort) for the next week as symptoms persist with pt at bedside and pt agreed to plan. Discussed Tamiflu and offered but that realistically probably wouldn't have much benefit. Pt declined the prescription. Encouraged pt and wife to follow proper hygiene precautions to avoid spread of illness.  Labs Review Labs Reviewed - No data to display Imaging Review Dg Chest 2 View  05/26/2013   CLINICAL DATA:  Cough and congestion and fever and body aches for 2 days  EXAM: CHEST  2 VIEW  COMPARISON:  None.  FINDINGS: The lungs are adequately inflated. There is no focal infiltrate. There are coarse lung markings in the right infrahilar region. The cardiac silhouette is normal in size. The pulmonary vascularity is not  engorged. The mediastinum is normal in width. There is mild tortuosity of the descending thoracic aorta. There is no pleural effusion or pneumothorax. The observed portions of the bony thorax appear normal.  IMPRESSION: The lungs exhibit no focal pneumonia. Coarse interstitial markings bilaterally may reflect the patient's smoking history. There is no evidence of CHF. One cannot absolutely exclude acute bronchitis in the appropriate clinical setting.   Electronically Signed   By: David  Martinique   On: 05/26/2013 15:03    EKG Interpretation   None       MDM   1. Influenza    73 year old male which likely with influenza. He appears very well. Chest x-ray is clear. At this point, plan symptomatic treatment. Offered Tamiflu, but he is declining a prescription. He probably would not get much benefit with the symptoms ongoing for about 2 days now. Return precautions were discussed. Outpatient followup as needed otherwise.  I personally preformed the services scribed in my presence. The recorded information has been reviewed is accurate. Virgel Manifold, MD.    Virgel Manifold, MD 05/29/13 [phone]

## 2013-05-26 NOTE — Discharge Instructions (Signed)

## 2013-05-26 NOTE — ED Notes (Signed)
MD at bedside. 

## 2013-08-31 ENCOUNTER — Encounter (HOSPITAL_COMMUNITY): Payer: Self-pay | Admitting: Pharmacy Technician

## 2013-09-06 NOTE — Patient Instructions (Signed)
Your procedure is scheduled on: 09/13/2013  Report to [facility] at  45  AM.  Call this number if you have problems the morning of surgery: [phone]   Do not eat food or drink liquids :After Midnight.      Take these medicines the morning of surgery with A SIP OF WATER: carvedilol, chlorthalidone, lisinopril   Do not wear jewelry, make-up or nail polish.  Do not wear lotions, powders, or perfumes.   Do not shave 48 hours prior to surgery.  Do not bring valuables to the hospital.  Contacts, dentures or bridgework may not be worn into surgery.  Leave suitcase in the car. After surgery it may be brought to your room.  For patients admitted to the hospital, checkout time is 11:00 AM the day of discharge.   Patients discharged the day of surgery will not be allowed to drive home.  :     Please read over the following fact sheets that you were given: Coughing and Deep Breathing, Surgical Site Infection Prevention, Anesthesia Post-op Instructions and Care and Recovery After Surgery    Cataract A cataract is a clouding of the lens of the eye. When a lens becomes cloudy, vision is reduced based on the degree and nature of the clouding. Many cataracts reduce vision to some degree. Some cataracts make people more near-sighted as they develop. Other cataracts increase glare. Cataracts that are ignored and become worse can sometimes look white. The white color can be seen through the pupil. CAUSES   Aging. However, cataracts may occur at any age, even in newborns.   Certain drugs.   Trauma to the eye.   Certain diseases such as diabetes.   Specific eye diseases such as chronic inflammation inside the eye or a sudden attack of a rare form of glaucoma.   Inherited or acquired medical problems.  SYMPTOMS   Gradual, progressive drop in vision in the affected eye.   Severe, rapid visual loss. This most often happens when trauma is the cause.  DIAGNOSIS  To detect a cataract, an eye doctor  examines the lens. Cataracts are best diagnosed with an exam of the eyes with the pupils enlarged (dilated) by drops.  TREATMENT  For an early cataract, vision may improve by using different eyeglasses or stronger lighting. If that does not help your vision, surgery is the only effective treatment. A cataract needs to be surgically removed when vision loss interferes with your everyday activities, such as driving, reading, or watching TV. A cataract may also have to be removed if it prevents examination or treatment of another eye problem. Surgery removes the cloudy lens and usually replaces it with a substitute lens (intraocular lens, IOL).  At a time when both you and your doctor agree, the cataract will be surgically removed. If you have cataracts in both eyes, only one is usually removed at a time. This allows the operated eye to heal and be out of danger from any possible problems after surgery (such as infection or poor wound healing). In rare cases, a cataract may be doing damage to your eye. In these cases, your caregiver may advise surgical removal right away. The vast majority of people who have cataract surgery have better vision afterward. HOME CARE INSTRUCTIONS  If you are not planning surgery, you may be asked to do the following:  Use different eyeglasses.   Use stronger or brighter lighting.   Ask your eye doctor about reducing your medicine dose or changing  medicines if it is thought that a medicine caused your cataract. Changing medicines does not make the cataract go away on its own.   Become familiar with your surroundings. Poor vision can lead to injury. Avoid bumping into things on the affected side. You are at a higher risk for tripping or falling.   Exercise extreme care when driving or operating machinery.   Wear sunglasses if you are sensitive to bright light or experiencing problems with glare.  SEEK IMMEDIATE MEDICAL CARE IF:   You have a worsening or sudden vision  loss.   You notice redness, swelling, or increasing pain in the eye.   You have a fever.  Document Released: 04/26/2005 Document Revised: 04/15/2011 Document Reviewed: 12/18/2010 [facility] Patient Information 2012 [email or web address].PATIENT INSTRUCTIONS POST-ANESTHESIA  IMMEDIATELY FOLLOWING SURGERY:  Do not drive or operate machinery for the first twenty four hours after surgery.  Do not make any important decisions for twenty four hours after surgery or while taking narcotic pain medications or sedatives.  If you develop intractable nausea and vomiting or a severe headache please notify your doctor immediately.  FOLLOW-UP:  Please make an appointment with your surgeon as instructed. You do not need to follow up with anesthesia unless specifically instructed to do so.  WOUND CARE INSTRUCTIONS (if applicable):  Keep a dry clean dressing on the anesthesia/puncture wound site if there is drainage.  Once the wound has quit draining you may leave it open to air.  Generally you should leave the bandage intact for twenty four hours unless there is drainage.  If the epidural site drains for more than 36-48 hours please call the anesthesia department.  QUESTIONS?:  Please feel free to call your physician or the hospital operator if you have any questions, and they will be happy to assist you.

## 2013-09-07 ENCOUNTER — Encounter (HOSPITAL_COMMUNITY): Payer: Self-pay

## 2013-09-07 ENCOUNTER — Encounter (HOSPITAL_COMMUNITY)
Admission: RE | Admit: 2013-09-07 | Discharge: 2013-09-07 | Disposition: A | Discharge status: Home / Self Care | Payer: Medicare Other | Source: Ambulatory Visit | Attending: Ophthalmology | Admitting: Ophthalmology

## 2013-09-07 DIAGNOSIS — Z01812 Encounter for preprocedural laboratory examination: Secondary | ICD-10-CM | POA: Insufficient documentation

## 2013-09-07 HISTORY — DX: Personal history of urinary calculi: Z87.442

## 2013-09-07 LAB — BASIC METABOLIC PANEL
BUN: 17 mg/dL (ref 6–23)
CALCIUM: 9.4 mg/dL (ref 8.4–10.5)
CHLORIDE: 102 meq/L (ref 96–112)
CO2: 28 meq/L (ref 19–32)
Creatinine, Ser: 0.83 mg/dL (ref 0.50–1.35)
GFR calc Af Amer: >90 mL/min (ref >90)
GFR calc non Af Amer: 87 mL/min — ABNORMAL LOW (ref >90)
GLUCOSE: 170 mg/dL — AB (ref 70–99)
POTASSIUM: 4.4 meq/L (ref 3.7–5.3)
SODIUM: 141 meq/L (ref 137–147)

## 2013-09-07 LAB — HEMOGLOBIN AND HEMATOCRIT, BLOOD
HEMATOCRIT: 42.1 % (ref 39.0–52.0)
Hemoglobin: 14.6 g/dL (ref 13.0–17.0)

## 2013-09-07 NOTE — Pre-Procedure Instructions (Signed)
Patient given information to sign up for my chart at home. 

## 2013-09-13 ENCOUNTER — Ambulatory Visit (HOSPITAL_COMMUNITY)
Admission: RE | Admit: 2013-09-13 | Discharge: 2013-09-13 | Disposition: A | Discharge status: Home / Self Care | Payer: Medicare Other | Source: Ambulatory Visit | Attending: Ophthalmology | Admitting: Ophthalmology

## 2013-09-13 ENCOUNTER — Encounter (HOSPITAL_COMMUNITY): Payer: Medicare Other | Admitting: Anesthesiology

## 2013-09-13 ENCOUNTER — Encounter (HOSPITAL_COMMUNITY): Payer: Self-pay | Admitting: *Deleted

## 2013-09-13 ENCOUNTER — Ambulatory Visit (HOSPITAL_COMMUNITY): Payer: Medicare Other | Admitting: Anesthesiology

## 2013-09-13 ENCOUNTER — Encounter (HOSPITAL_COMMUNITY)
Admission: RE | Disposition: A | Discharge status: Home / Self Care | Payer: Self-pay | Source: Ambulatory Visit | Attending: Ophthalmology

## 2013-09-13 DIAGNOSIS — H269 Unspecified cataract: Secondary | ICD-10-CM | POA: Insufficient documentation

## 2013-09-13 DIAGNOSIS — H2589 Other age-related cataract: Secondary | ICD-10-CM | POA: Insufficient documentation

## 2013-09-13 HISTORY — PX: CATARACT EXTRACTION W/PHACO: SHX586

## 2013-09-13 LAB — GLUCOSE, CAPILLARY: Glucose-Capillary: 135 mg/dL — ABNORMAL HIGH (ref 70–99)

## 2013-09-13 SURGERY — PHACOEMULSIFICATION, CATARACT, WITH IOL INSERTION
Anesthesia: Monitor Anesthesia Care | Site: Eye | Laterality: Left

## 2013-09-13 MED ORDER — EPINEPHRINE HCL 1 MG/ML IJ SOLN
INTRAOCULAR | Status: DC | PRN
  Administered 2013-09-13: 08:00:00

## 2013-09-13 MED ORDER — NA HYALUR & NA CHOND-NA HYALUR 0.55-0.5 ML IO KIT
PACK | INTRAOCULAR | Status: DC | PRN
  Administered 2013-09-13: 1 via OPHTHALMIC

## 2013-09-13 MED ORDER — LIDOCAINE HCL (PF) 1 % IJ SOLN
INTRAMUSCULAR | Status: AC
  Filled 2013-09-13: qty 2

## 2013-09-13 MED ORDER — LIDOCAINE 3.5 % OP GEL OPTIME - NO CHARGE: OPHTHALMIC | Status: DC | PRN

## 2013-09-13 MED ORDER — NEOMYCIN-POLYMYXIN-DEXAMETH 3.5-10000-0.1 OP SUSP
OPHTHALMIC | Status: AC
  Filled 2013-09-13: qty 5

## 2013-09-13 MED ORDER — PHENYLEPHRINE HCL 2.5 % OP SOLN
1.0000 [drp] | OPHTHALMIC | Status: AC
  Administered 2013-09-13 (×3): 1 [drp] via OPHTHALMIC

## 2013-09-13 MED ORDER — LIDOCAINE HCL 3.5 % OP GEL
OPHTHALMIC | Status: AC
  Filled 2013-09-13: qty 1

## 2013-09-13 MED ORDER — MIDAZOLAM HCL 2 MG/2ML IJ SOLN
INTRAMUSCULAR | Status: AC
  Filled 2013-09-13: qty 2

## 2013-09-13 MED ORDER — BSS IO SOLN
INTRAOCULAR | Status: DC | PRN
  Administered 2013-09-13: 15 mL via INTRAOCULAR

## 2013-09-13 MED ORDER — POVIDONE-IODINE 5 % OP SOLN
OPHTHALMIC | Status: DC | PRN
  Administered 2013-09-13: 1 via OPHTHALMIC

## 2013-09-13 MED ORDER — FENTANYL CITRATE 0.05 MG/ML IJ SOLN
25.0000 ug | INTRAMUSCULAR | Status: AC
  Administered 2013-09-13 (×2): 25 ug via INTRAVENOUS

## 2013-09-13 MED ORDER — TETRACAINE HCL 0.5 % OP SOLN
1.0000 [drp] | OPHTHALMIC | Status: AC
  Administered 2013-09-13 (×3): 1 [drp] via OPHTHALMIC

## 2013-09-13 MED ORDER — TETRACAINE HCL 0.5 % OP SOLN
OPHTHALMIC | Status: AC
  Filled 2013-09-13: qty 2

## 2013-09-13 MED ORDER — PHENYLEPHRINE HCL 2.5 % OP SOLN
OPHTHALMIC | Status: AC
  Filled 2013-09-13: qty 15

## 2013-09-13 MED ORDER — LIDOCAINE HCL 3.5 % OP GEL
1.0000 "application " | Freq: Once | OPHTHALMIC | Status: AC
  Administered 2013-09-13: 1 via OPHTHALMIC

## 2013-09-13 MED ORDER — CYCLOPENTOLATE-PHENYLEPHRINE 0.2-1 % OP SOLN
1.0000 [drp] | OPHTHALMIC | Status: AC
  Administered 2013-09-13 (×3): 1 [drp] via OPHTHALMIC

## 2013-09-13 MED ORDER — FENTANYL CITRATE 0.05 MG/ML IJ SOLN
INTRAMUSCULAR | Status: AC
  Filled 2013-09-13: qty 2

## 2013-09-13 MED ORDER — LACTATED RINGERS IV SOLN
INTRAVENOUS | Status: DC
  Administered 2013-09-13: 07:00:00 via INTRAVENOUS

## 2013-09-13 MED ORDER — EPINEPHRINE HCL 1 MG/ML IJ SOLN
INTRAMUSCULAR | Status: AC
  Filled 2013-09-13: qty 1

## 2013-09-13 MED ORDER — LIDOCAINE HCL (PF) 1 % IJ SOLN
INTRAMUSCULAR | Status: DC | PRN
  Administered 2013-09-13: .5 mL

## 2013-09-13 MED ORDER — MIDAZOLAM HCL 2 MG/2ML IJ SOLN
1.0000 mg | INTRAMUSCULAR | Status: DC | PRN
  Administered 2013-09-13: 2 mg via INTRAVENOUS

## 2013-09-13 MED ORDER — NEOMYCIN-POLYMYXIN-DEXAMETH 3.5-10000-0.1 OP SUSP
OPHTHALMIC | Status: DC | PRN
  Administered 2013-09-13: 2 [drp] via OPHTHALMIC

## 2013-09-13 MED ORDER — CYCLOPENTOLATE-PHENYLEPHRINE OP SOLN OPTIME - NO CHARGE
OPHTHALMIC | Status: AC
  Filled 2013-09-13: qty 2

## 2013-09-13 SURGICAL SUPPLY — 35 items
CAPSULAR TENSION RING-AMO (OPHTHALMIC RELATED) IMPLANT
CLOTH BEACON ORANGE TIMEOUT ST (SAFETY) ×2 IMPLANT
EYE SHIELD UNIVERSAL CLEAR (GAUZE/BANDAGES/DRESSINGS) ×2 IMPLANT
GLOVE BIO SURGEON STRL SZ 6.5 (GLOVE) IMPLANT
GLOVE BIO SURGEONS STRL SZ 6.5 (GLOVE)
GLOVE BIOGEL PI IND STRL 6.5 (GLOVE) IMPLANT
GLOVE BIOGEL PI IND STRL 7.0 (GLOVE) IMPLANT
GLOVE BIOGEL PI IND STRL 7.5 (GLOVE) IMPLANT
GLOVE BIOGEL PI INDICATOR 6.5 (GLOVE)
GLOVE BIOGEL PI INDICATOR 7.0 (GLOVE) ×4
GLOVE BIOGEL PI INDICATOR 7.5 (GLOVE)
GLOVE ECLIPSE 6.5 STRL STRAW (GLOVE) IMPLANT
GLOVE ECLIPSE 7.0 STRL STRAW (GLOVE) IMPLANT
GLOVE ECLIPSE 7.5 STRL STRAW (GLOVE) IMPLANT
GLOVE EXAM NITRILE LRG STRL (GLOVE) IMPLANT
GLOVE EXAM NITRILE MD LF STRL (GLOVE) IMPLANT
GLOVE SKINSENSE NS SZ6.5 (GLOVE)
GLOVE SKINSENSE NS SZ7.0 (GLOVE)
GLOVE SKINSENSE STRL SZ6.5 (GLOVE) IMPLANT
GLOVE SKINSENSE STRL SZ7.0 (GLOVE) IMPLANT
KIT VITRECTOMY (OPHTHALMIC RELATED) IMPLANT
PAD ARMBOARD 7.5X6 YLW CONV (MISCELLANEOUS) ×2 IMPLANT
PROC W NO LENS (INTRAOCULAR LENS)
PROC W SPEC LENS (INTRAOCULAR LENS)
PROCESS W NO LENS (INTRAOCULAR LENS) IMPLANT
PROCESS W SPEC LENS (INTRAOCULAR LENS) IMPLANT
RING MALYGIN (MISCELLANEOUS) IMPLANT
SIGHTPATH CAT PROC W REG LENS (Ophthalmic Related) ×3 IMPLANT
SYR TB 1ML LL NO SAFETY (SYRINGE) ×2 IMPLANT
TAPE SURG TRANSPARENT 2IN (GAUZE/BANDAGES/DRESSINGS) IMPLANT
TAPE SURG TRANSPORE 1 IN (GAUZE/BANDAGES/DRESSINGS) IMPLANT
TAPE SURGICAL TRANSPORE 1 IN (GAUZE/BANDAGES/DRESSINGS) ×2
TAPE TRANSPARENT 2IN (GAUZE/BANDAGES/DRESSINGS) ×2
VISCOELASTIC ADDITIONAL (OPHTHALMIC RELATED) IMPLANT
WATER STERILE IRR 250ML POUR (IV SOLUTION) ×2 IMPLANT

## 2013-09-13 NOTE — Transfer of Care (Signed)
Immediate Anesthesia Transfer of Care Note  Patient: Christian Rasmussen  Procedure(s) Performed: Procedure(s) with comments: CATARACT EXTRACTION PHACO AND INTRAOCULAR LENS PLACEMENT (IOC) (Left) - CDE 9.38  Patient Location: Short Stay  Anesthesia Type:MAC  Level of Consciousness: awake, alert  and oriented  Airway & Oxygen Therapy: Patient Spontanous Breathing  Post-op Assessment: Report given to PACU RN  Post vital signs: Reviewed  Complications: No apparent anesthesia complications

## 2013-09-13 NOTE — Anesthesia Postprocedure Evaluation (Signed)
  Anesthesia Post-op Note  Patient: Christian Rasmussen  Procedure(s) Performed: Procedure(s) with comments: CATARACT EXTRACTION PHACO AND INTRAOCULAR LENS PLACEMENT (IOC) (Left) - CDE 9.38  Patient Location: Short Stay  Anesthesia Type:MAC  Level of Consciousness: awake  Airway and Oxygen Therapy: Patient Spontanous Breathing  Post-op Pain: none  Post-op Assessment: Post-op Vital signs reviewed, Patient's Cardiovascular Status Stable, Respiratory Function Stable, Patent Airway and No signs of Nausea or vomiting  Post-op Vital Signs: Reviewed and stable  Last Vitals:  Filed Vitals:   09/13/13 0740  BP: 122/72  Temp:   Resp: 16    Complications: No apparent anesthesia complications

## 2013-09-13 NOTE — Anesthesia Preprocedure Evaluation (Signed)
Anesthesia Evaluation  Patient identified by MRN, date of birth, ID band Patient awake    Reviewed: Allergy & Precautions, H&P , NPO status , Patient's Chart, lab work & pertinent test results, reviewed documented beta blocker date and time   Airway Mallampati: I TM Distance: >3 FB Neck ROM: full    Dental  (+) Teeth Intact, Dental Advidsory Given   Pulmonary former smoker,          Cardiovascular hypertension, On Home Beta Blockers + CAD + dysrhythmias Atrial Fibrillation Rhythm:irregular Rate:Normal     Neuro/Psych    GI/Hepatic   Endo/Other  diabetes, Well Controlled, Type 2, Oral Hypoglycemic Agents  Renal/GU      Musculoskeletal   Abdominal   Peds  Hematology   Anesthesia Other Findings   Reproductive/Obstetrics                           Anesthesia Physical Anesthesia Plan  ASA: III  Anesthesia Plan: MAC   Post-op Pain Management:    Induction: Intravenous  Airway Management Planned: Nasal Cannula  Additional Equipment:   Intra-op Plan:   Post-operative Plan:   Informed Consent: I have reviewed the patients History and Physical, chart, labs and discussed the procedure including the risks, benefits and alternatives for the proposed anesthesia with the patient or authorized representative who has indicated his/her understanding and acceptance.     Plan Discussed with:   Anesthesia Plan Comments:         Anesthesia Quick Evaluation

## 2013-09-13 NOTE — Op Note (Signed)
Date of Admission: 09/13/2013  Date of Surgery: 09/13/2013   Pre-Op Dx: Cataract Left Eye  Post-Op Dx: Cataract Left  Eye,  Dx Code 366.19  Surgeon: Tonny Branch, M.D.  Assistants: None  Anesthesia: Topical with MAC  Indications: Painless, progressive loss of vision with compromise of daily activities.  Surgery: Cataract Extraction with Intraocular lens Implant Left Eye  Discription: The patient had dilating drops and viscous lidocaine placed into the Left eye in the pre-op holding area. After transfer to the operating room, a time out was performed. The patient was then prepped and draped. Beginning with a 39 degree blade a paracentesis port was made at the surgeon's 2 o'clock position. The anterior chamber was then filled with 1% non-preserved lidocaine. This was followed by filling the anterior chamber with Provisc.  A 2.74mm keratome blade was used to make a clear corneal incision at the temporal limbus.  A bent cystatome needle was used to create a continuous tear capsulotomy. Hydrodissection was performed with balanced salt solution on a Fine canula. The lens nucleus was then removed using the phacoemulsification handpiece. Residual cortex was removed with the I&A handpiece. The anterior chamber and capsular bag were refilled with Provisc. A posterior chamber intraocular lens was placed into the capsular bag with it's injector. The implant was positioned with the Kuglan hook. The Provisc was then removed from the anterior chamber and capsular bag with the I&A handpiece. Stromal hydration of the main incision and paracentesis port was performed with BSS on a Fine canula. The wounds were tested for leak which was negative. The patient tolerated the procedure well. There were no operative complications. The patient was then transferred to the recovery room in stable condition.  Complications: None  Specimen: None  EBL: None  Prosthetic device: Hoya iSert 250, power 15.0 D, SN [id number].

## 2013-09-13 NOTE — Discharge Instructions (Signed)

## 2013-09-13 NOTE — H&P (Signed)
I have reviewed the H&P, the patient was re-examined, and I have identified no interval changes in medical condition and plan of care since the history and physical of record  

## 2013-09-14 ENCOUNTER — Encounter (HOSPITAL_COMMUNITY): Payer: Self-pay | Admitting: Ophthalmology

## 2013-10-19 ENCOUNTER — Ambulatory Visit: Payer: Medicare Other | Admitting: Cardiovascular Disease

## 2013-11-07 ENCOUNTER — Ambulatory Visit: Payer: Medicare Other | Admitting: Cardiovascular Disease

## 2013-11-08 ENCOUNTER — Ambulatory Visit (INDEPENDENT_AMBULATORY_CARE_PROVIDER_SITE_OTHER): Payer: Medicare Other | Admitting: Cardiovascular Disease

## 2013-11-08 ENCOUNTER — Encounter: Payer: Self-pay | Admitting: Cardiovascular Disease

## 2013-11-08 VITALS — BP 131/71 | HR 76 | Ht 71.0 in | Wt 240.0 lb

## 2013-11-08 DIAGNOSIS — E785 Hyperlipidemia, unspecified: Secondary | ICD-10-CM

## 2013-11-08 DIAGNOSIS — I10 Essential (primary) hypertension: Secondary | ICD-10-CM

## 2013-11-08 DIAGNOSIS — Z9889 Other specified postprocedural states: Secondary | ICD-10-CM

## 2013-11-08 DIAGNOSIS — I359 Nonrheumatic aortic valve disorder, unspecified: Secondary | ICD-10-CM

## 2013-11-08 DIAGNOSIS — I2510 Atherosclerotic heart disease of native coronary artery without angina pectoris: Secondary | ICD-10-CM

## 2013-11-08 DIAGNOSIS — Z8679 Personal history of other diseases of the circulatory system: Secondary | ICD-10-CM

## 2013-11-08 DIAGNOSIS — I350 Nonrheumatic aortic (valve) stenosis: Secondary | ICD-10-CM

## 2013-11-08 NOTE — Patient Instructions (Signed)
Continue all current medications. Your physician wants you to follow up in: 6 months.  You will receive a reminder letter in the mail one-two months in advance.  If you don't receive a letter, please call our office to schedule the follow up appointment   

## 2013-11-08 NOTE — Progress Notes (Signed)
Patient ID: Christian Rasmussen, male   DOB: 1940-09-10, 73 y.o.   MRN: [id number]      SUBJECTIVE: The patient is a 73 year old male with a cardiovascular history significant for atrial flutter ablation, coronary artery disease with previous percutaneous coronary intervention, minimal aortic stenosis, hypertension and hyperlipidemia. He denies chest pain, shortness of breath, lightheadedness, dizziness, palpitations, leg swelling and syncope. He said that when he was in atrial flutter, he never experienced palpitations.  He had bilateral cataract surgery earlier this year and has done well. He does not exercise and says he is just stubborn.  His father passed away at 43 of a heart attack and his brother died at the age of 50, and he never knew either of his grandfathers.  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     No Known Allergies  Current Outpatient Prescriptions  Medication Sig Dispense Refill  . aspirin EC 81 MG tablet Take 1 tablet (81 mg total) by mouth daily.  90 tablet  3  . ASTAXANTHIN PO Take 10 mg by mouth daily.      Marland Kitchen atorvastatin (LIPITOR) 40 MG tablet Take 1 tablet by mouth daily.      . Berberine Chloride POWD Take 1,200 mg by mouth daily.      . carvedilol (COREG) 6.25 MG tablet Take 6.25 mg by mouth 2 (two) times daily.      . chlorthalidone (HYGROTON) 25 MG tablet Take 25 mg by mouth daily.      . Coenzyme Q10 (CO Q10) 100 MG CAPS Take 1 tablet by mouth daily.      . fish oil-omega-3 fatty acids 1000 MG capsule Take 2 g by mouth daily.      . Garlic Oil 8850 MG CAPS Take 1 capsule by mouth daily.       Marland Kitchen KRILL OIL 1000 MG CAPS Take 1 capsule by mouth daily.       Marland Kitchen lisinopril (PRINIVIL,ZESTRIL) 20 MG tablet Take 1 tablet (20 mg total) by mouth daily.  90 tablet  3  . Multiple Vitamin (MULITIVITAMIN WITH MINERALS) TABS Take 1 tablet by mouth daily.      Marland Kitchen OVER THE COUNTER MEDICATION Take 1 capsule by mouth 3 (three) times daily. Glucose  Essentials- Chromium, [place] kino tree extract, Gymnema, Tursaline Extract,Vanadyl Sulfate, Banaba Extract       No current facility-administered medications for this visit.    Past Medical History  Diagnosis Date  . CAD (coronary artery disease)     status post Taxus stent patency of RCA 2004, Cardiolite negative for ischemia in 2010.  Marland Kitchen Dyslipidemia   . Hypertension   . Right bundle branch block (RBBB) with left anterior hemiblock   . Typical atrial flutter     s/p ablation 11-23-2012 by Dr Rayann Heman  . Overweight   . Diabetes mellitus   . Snores   . History of kidney stones     Past Surgical History  Procedure Laterality Date  . Ankle surgery Left     total ankle replacement  . Ablation  11-23-2012    s/p cavotricuspid isthmus ablation by Dr Rayann Heman  . Coronary angioplasty      3 stents  . Cataract extraction Right   . Cataract extraction w/phaco Left 09/13/2013    Procedure: CATARACT EXTRACTION PHACO AND INTRAOCULAR LENS PLACEMENT (IOC);  Surgeon: Tonny Branch, MD;  Location: [facility];  Service: Ophthalmology;  Laterality: Left;  CDE 9.38  History   Social History  . Marital Status: Married    Spouse Name: N/A    Number of Children: N/A  . 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 Smoking status: Former Smoker -- 1.00 packs/day for 28 years    Types: Cigarettes    Quit date: 05/10/1985  . Smokeless tobacco: Never Used  . Alcohol Use: No  . Drug Use: No  . Sexual Activity: Yes    Birth Control/ Protection: None   Other Topics Concern  . Not on file   Social History Narrative   Lives in [place] with spouse.   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 Lungs: Clear to auscultation bilaterally with normal respiratory effort. CV:  Nondisplaced PMI.  Regular rate and rhythm, normal S1/S2, no S3/S4, II/VI early systolic murmur over RUSB. No pretibial or periankle edema.  No carotid bruit.   Abdomen: Soft, nontender, no hepatosplenomegaly, no distention.  Neurologic: Alert and oriented x 3.  Psych: Normal affect. Extremities: No clubbing or cyanosis.   ECG: reviewed and available in electronic records.      ASSESSMENT AND PLAN:  ATRIAL FLUTTER s/p ablation:  No recurrences. Stable from this standpoint.  CAD:  The patient has no new sypmtoms. No further cardiovascular testing is indicated. We will continue with aggressive risk reduction and medications as listed, which include ASA, Lipitor, Coreg, and lisinopril.   MINIMAL AORTIC STENOSIS:  No change in therapy or further imaging is indicated.   HYPERTENSION:  Controlled on present therapy.   EXERCISE:  I gave him extensive counseling with regards to this, in order to attenuate his risk of future cardiac events.  Hyperlipidemia: He will fax [place] the results for recent testing.  Dispo: f/u 6 months.  Kate Sable, M.D., F.A.C.C.

## 2013-12-14 ENCOUNTER — Other Ambulatory Visit: Payer: Self-pay

## 2013-12-14 MED ORDER — CARVEDILOL 6.25 MG PO TABS: 6.2500 mg | ORAL_TABLET | Freq: Two times a day (BID) | ORAL | Status: DC

## 2013-12-19 ENCOUNTER — Other Ambulatory Visit: Payer: Self-pay

## 2013-12-19 MED ORDER — CARVEDILOL 6.25 MG PO TABS: 6.2500 mg | ORAL_TABLET | Freq: Two times a day (BID) | ORAL | Status: DC

## 2014-04-18 ENCOUNTER — Encounter (HOSPITAL_COMMUNITY): Payer: Self-pay | Admitting: Internal Medicine

## 2014-05-08 ENCOUNTER — Other Ambulatory Visit: Payer: Self-pay | Admitting: Cardiovascular Disease

## 2014-05-08 MED ORDER — CARVEDILOL 6.25 MG PO TABS: 6.2500 mg | ORAL_TABLET | Freq: Two times a day (BID) | ORAL | Status: DC

## 2014-05-15 ENCOUNTER — Other Ambulatory Visit: Payer: Self-pay | Admitting: *Deleted

## 2014-05-15 MED ORDER — CARVEDILOL 6.25 MG PO TABS: 6.2500 mg | ORAL_TABLET | Freq: Two times a day (BID) | ORAL | Status: DC

## 2014-05-21 ENCOUNTER — Other Ambulatory Visit: Payer: Self-pay | Admitting: *Deleted

## 2014-05-29 ENCOUNTER — Other Ambulatory Visit: Payer: Self-pay | Admitting: Cardiovascular Disease

## 2014-06-20 ENCOUNTER — Ambulatory Visit (INDEPENDENT_AMBULATORY_CARE_PROVIDER_SITE_OTHER): Payer: Medicare Other | Admitting: Cardiovascular Disease

## 2014-06-20 ENCOUNTER — Encounter: Payer: Self-pay | Admitting: Cardiovascular Disease

## 2014-06-20 VITALS — BP 134/77 | HR 78 | Ht 71.0 in | Wt 242.0 lb

## 2014-06-20 DIAGNOSIS — Z8679 Personal history of other diseases of the circulatory system: Secondary | ICD-10-CM

## 2014-06-20 DIAGNOSIS — E785 Hyperlipidemia, unspecified: Secondary | ICD-10-CM

## 2014-06-20 DIAGNOSIS — I2510 Atherosclerotic heart disease of native coronary artery without angina pectoris: Secondary | ICD-10-CM

## 2014-06-20 DIAGNOSIS — Z9889 Other specified postprocedural states: Secondary | ICD-10-CM

## 2014-06-20 DIAGNOSIS — I10 Essential (primary) hypertension: Secondary | ICD-10-CM

## 2014-06-20 DIAGNOSIS — I358 Other nonrheumatic aortic valve disorders: Secondary | ICD-10-CM

## 2014-06-20 NOTE — Patient Instructions (Addendum)
Your physician wants you to follow-up in: 1 year with Dr. Virgina Jock will receive a reminder letter in the mail two months in advance. If you don't receive a letter, please call our office to schedule the follow-up appointment.  WE WILL DO AN EKG AT YOUR NEXT OFFICE VISIT  Your physician recommends that you continue on your current medications as directed. Please refer to the Current Medication list given to you today.  Thank you for choosing [facility]!!

## 2014-06-20 NOTE — Addendum Note (Signed)
Addended by: Julian Hy T on: 06/20/2014 09:31 AM   Modules accepted: Level of Service

## 2014-06-20 NOTE — Progress Notes (Signed)
Patient ID: Christian Rasmussen, male   DOB: 1941/03/09, 74 y.o.   MRN: [id number]      SUBJECTIVE: The patient is a 74 year old male with a cardiovascular history significant for atrial flutter ablation, coronary artery disease with previous percutaneous coronary intervention, minimal aortic stenosis, hypertension and hyperlipidemia.  He denies chest pain, lightheadedness, dizziness, palpitations, leg swelling and syncope. He previously told me that when he was in atrial flutter, he never experienced palpitations.  He does not like exercising but believes he should start. He gets short of breath only with very significant exertion which is not often.  His father passed away at 44 of a heart attack and his brother died at the age of 88, and he never knew either of his grandfathers.  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   Review of Systems: As per "subjective", otherwise negative.  No Known Allergies  Current Outpatient Prescriptions  Medication Sig Dispense Refill  . aspirin EC 81 MG tablet Take 1 tablet (81 mg total) by mouth daily. 90 tablet 3  . ASTAXANTHIN PO Take 10 mg by mouth daily.    Marland Kitchen atorvastatin (LIPITOR) 40 MG tablet Take 1 tablet by mouth daily.    . Berberine Chloride POWD Take 1,200 mg by mouth daily.    . carvedilol (COREG) 6.25 MG tablet Take 1 tablet twice daily 180 tablet 1  . chlorthalidone (HYGROTON) 25 MG tablet Take 25 mg by mouth daily.    . Coenzyme Q10 (CO Q10) 100 MG CAPS Take 1 tablet by mouth daily.    . fish oil-omega-3 fatty acids 1000 MG capsule Take 2 g by mouth daily.    Marland Kitchen KRILL OIL 1000 MG CAPS Take 1 capsule by mouth daily.     Marland Kitchen lisinopril (PRINIVIL,ZESTRIL) 20 MG tablet Take 1 tablet (20 mg total) by mouth daily. 90 tablet 3  . Multiple Vitamin (MULITIVITAMIN WITH MINERALS) TABS Take 1 tablet by mouth daily.    Marland Kitchen OVER THE COUNTER MEDICATION Take 1 capsule by mouth 3 (three)  times daily. Glucose Essentials- Chromium, [place] kino tree extract, Gymnema, Tursaline Extract,Vanadyl Sulfate, Banaba Extract     No current facility-administered medications for this visit.    Past Medical History  Diagnosis Date  . CAD (coronary artery disease)     status post Taxus stent patency of RCA 2004, Cardiolite negative for ischemia in 2010.  Marland Kitchen Dyslipidemia   . Hypertension   . Right bundle branch block (RBBB) with left anterior hemiblock   . Typical atrial flutter     s/p ablation 11-23-2012 by Dr Rayann Heman  . Overweight   . Diabetes mellitus   . Snores   . History of kidney stones     Past Surgical History  Procedure Laterality Date  . Ankle surgery Left     total ankle replacement  . Ablation  11-23-2012    s/p cavotricuspid isthmus ablation by Dr Rayann Heman  . Coronary angioplasty      3 stents  . Cataract extraction Right   . Cataract extraction w/phaco Left 09/13/2013    Procedure: CATARACT EXTRACTION PHACO AND INTRAOCULAR LENS PLACEMENT (IOC);  Surgeon: Tonny Branch, MD;  Location: [facility];  Service: Ophthalmology;  Laterality: Left;  CDE 9.38  . Atrial flutter ablation N/A 11/23/2012    Procedure: ATRIAL FLUTTER ABLATION;  Surgeon: Thompson Grayer, MD;  Location: [facility] CATH LAB;  Service: Cardiovascular;  Laterality: N/A;  History   Social History  . Marital Status: Married    Spouse Name: N/A  . Number of Children: N/A  . 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 Smoking status: Former Smoker -- 1.00 packs/day for 28 years    Types: Cigarettes    Start date: 10/21/1956    Quit date: 05/10/1985  . Smokeless tobacco: Never Used  . Alcohol Use: No  . Drug Use: No  . Sexual Activity: Yes    Birth Control/ Protection: None   Other Topics Concern  . Not on file   Social History Narrative   Lives in [place] with spouse.   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 Lungs: Clear to auscultation bilaterally with normal respiratory effort. CV: Nondisplaced PMI. Regular rate and rhythm, normal S1/S2, no S3/S4, II/VI early systolic murmur over RUSB. No pretibial or periankle edema. No carotid bruit.  Abdomen: Soft, nontender, obese, no distention.  Neurologic: Alert and oriented x 3.  Psych: Normal affect. Skin: Normal. Musculoskeletal: Normal range of motion, no gross deformities. Extremities: No clubbing or cyanosis.   ECG: Most recent ECG reviewed.      ASSESSMENT AND PLAN:  Atrial flutter s/p ablation:  No recurrences. Stable from this standpoint.  Coronary artery disease:  Symptomatically stable. Will continue with aggressive risk reduction and medications as listed, which include ASA, Lipitor, Coreg, chlorthalidone, and lisinopril. Exercise was encouraged to reduce cardiovascular risk.  Aortic sclerosis:  No evidence of actual stenosis by echo in 5/14. No indication for imaging at this time.  Essential hypertension:  Controlled on present therapy. No changes.  Hyperlipidemia: Lipids on 04/25/14 demonstrated total cholesterol 115, triglycerides 200, HDL 36, LDL 39. Exercise and dietary modification was encouraged to reduce triglycerides. Continue Lipitor 40 mg daily.  Dispo: f/u 1 year.   Kate Sable, M.D., F.A.C.C.

## 2014-07-29 ENCOUNTER — Encounter (INDEPENDENT_AMBULATORY_CARE_PROVIDER_SITE_OTHER): Payer: Self-pay | Admitting: *Deleted

## 2014-08-05 ENCOUNTER — Other Ambulatory Visit (INDEPENDENT_AMBULATORY_CARE_PROVIDER_SITE_OTHER): Payer: Self-pay | Admitting: *Deleted

## 2014-08-05 ENCOUNTER — Encounter (INDEPENDENT_AMBULATORY_CARE_PROVIDER_SITE_OTHER): Payer: Self-pay | Admitting: *Deleted

## 2014-08-05 DIAGNOSIS — Z1211 Encounter for screening for malignant neoplasm of colon: Secondary | ICD-10-CM

## 2014-09-06 ENCOUNTER — Telehealth (INDEPENDENT_AMBULATORY_CARE_PROVIDER_SITE_OTHER): Payer: Self-pay | Admitting: *Deleted

## 2014-09-06 DIAGNOSIS — Z1211 Encounter for screening for malignant neoplasm of colon: Secondary | ICD-10-CM

## 2014-09-06 MED ORDER — PEG 3350-KCL-NA BICARB-NACL 420 G PO SOLR: 4000.0000 mL | Freq: Once | ORAL | Status: DC

## 2014-09-06 NOTE — Telephone Encounter (Signed)
Patient needs trilyte 

## 2014-09-13 ENCOUNTER — Telehealth (INDEPENDENT_AMBULATORY_CARE_PROVIDER_SITE_OTHER): Payer: Self-pay | Admitting: *Deleted

## 2014-09-13 NOTE — Telephone Encounter (Signed)
Referring MD/PCP: shah   Procedure: tcs  Reason/Indication:  screening  Has patient had this procedure before?  Yes, 10 years ago  If so, when, by whom and where?    Is there a family history of colon cancer?  no  Who?  What age when diagnosed?    Is patient diabetic?   yes      Does patient have prosthetic heart valve?  no  Do you have a pacemaker?  no  Has patient ever had endocarditis? no  Has patient had joint replacement within last 12 months?  no  Does patient tend to be constipated or take laxatives? no  Is patient on Coumadin, Plavix and/or Aspirin? yes  Medications: asa 81 mg daily, lipitor 40 mg daily, lisinopril 20 mg daily, chlorthalidone 25 mg daily, metformin 500 mg daily, vitamins, coreg  Allergies: nkda  Medication Adjustment: asa 2 days, hold metformin morning of  Procedure date & time: 10/10/14 at 830

## 2014-09-19 NOTE — Telephone Encounter (Signed)
agree

## 2014-10-09 NOTE — Progress Notes (Signed)
Spoke to Dr. Laural Golden about patient's taking Metformin. He ordered patient to hold evening dose (Wednesday) and hold morning dose Thursday. Patient verbalized understanding.

## 2014-10-10 ENCOUNTER — Ambulatory Visit (HOSPITAL_COMMUNITY)
Admission: RE | Admit: 2014-10-10 | Discharge: 2014-10-10 | Disposition: A | Discharge status: Home / Self Care | Payer: Medicare Other | Source: Ambulatory Visit | Attending: Internal Medicine | Admitting: Internal Medicine

## 2014-10-10 ENCOUNTER — Encounter (HOSPITAL_COMMUNITY): Payer: Self-pay | Admitting: *Deleted

## 2014-10-10 ENCOUNTER — Encounter (HOSPITAL_COMMUNITY)
Admission: RE | Disposition: A | Discharge status: Home / Self Care | Payer: Self-pay | Source: Ambulatory Visit | Attending: Internal Medicine

## 2014-10-10 DIAGNOSIS — Z79899 Other long term (current) drug therapy: Secondary | ICD-10-CM | POA: Insufficient documentation

## 2014-10-10 DIAGNOSIS — Z87891 Personal history of nicotine dependence: Secondary | ICD-10-CM | POA: Diagnosis not present

## 2014-10-10 DIAGNOSIS — I2510 Atherosclerotic heart disease of native coronary artery without angina pectoris: Secondary | ICD-10-CM | POA: Insufficient documentation

## 2014-10-10 DIAGNOSIS — K5730 Diverticulosis of large intestine without perforation or abscess without bleeding: Secondary | ICD-10-CM | POA: Insufficient documentation

## 2014-10-10 DIAGNOSIS — K648 Other hemorrhoids: Secondary | ICD-10-CM | POA: Diagnosis not present

## 2014-10-10 DIAGNOSIS — Z1211 Encounter for screening for malignant neoplasm of colon: Secondary | ICD-10-CM

## 2014-10-10 DIAGNOSIS — E785 Hyperlipidemia, unspecified: Secondary | ICD-10-CM | POA: Diagnosis not present

## 2014-10-10 DIAGNOSIS — Z955 Presence of coronary angioplasty implant and graft: Secondary | ICD-10-CM | POA: Insufficient documentation

## 2014-10-10 DIAGNOSIS — D122 Benign neoplasm of ascending colon: Secondary | ICD-10-CM | POA: Insufficient documentation

## 2014-10-10 DIAGNOSIS — Z6833 Body mass index (BMI) 33.0-33.9, adult: Secondary | ICD-10-CM | POA: Insufficient documentation

## 2014-10-10 DIAGNOSIS — I10 Essential (primary) hypertension: Secondary | ICD-10-CM | POA: Diagnosis not present

## 2014-10-10 DIAGNOSIS — Z7982 Long term (current) use of aspirin: Secondary | ICD-10-CM | POA: Insufficient documentation

## 2014-10-10 DIAGNOSIS — E119 Type 2 diabetes mellitus without complications: Secondary | ICD-10-CM | POA: Diagnosis not present

## 2014-10-10 DIAGNOSIS — E663 Overweight: Secondary | ICD-10-CM | POA: Diagnosis not present

## 2014-10-10 DIAGNOSIS — K644 Residual hemorrhoidal skin tags: Secondary | ICD-10-CM | POA: Insufficient documentation

## 2014-10-10 DIAGNOSIS — D125 Benign neoplasm of sigmoid colon: Secondary | ICD-10-CM | POA: Diagnosis not present

## 2014-10-10 HISTORY — PX: COLONOSCOPY: SHX5424

## 2014-10-10 LAB — GLUCOSE, CAPILLARY: GLUCOSE-CAPILLARY: 150 mg/dL — AB (ref 65–99)

## 2014-10-10 SURGERY — COLONOSCOPY
Anesthesia: Moderate Sedation

## 2014-10-10 MED ORDER — MIDAZOLAM HCL 5 MG/5ML IJ SOLN
INTRAMUSCULAR | Status: DC | PRN
  Administered 2014-10-10 (×2): 1 mg via INTRAVENOUS
  Administered 2014-10-10 (×2): 2 mg via INTRAVENOUS

## 2014-10-10 MED ORDER — MEPERIDINE HCL 50 MG/ML IJ SOLN
INTRAMUSCULAR | Status: DC | PRN
  Administered 2014-10-10 (×2): 25 mg via INTRAVENOUS

## 2014-10-10 MED ORDER — MIDAZOLAM HCL 5 MG/5ML IJ SOLN
INTRAMUSCULAR | Status: AC
  Filled 2014-10-10: qty 10

## 2014-10-10 MED ORDER — SODIUM CHLORIDE 0.9 % IV SOLN
INTRAVENOUS | Status: DC
  Administered 2014-10-10: 08:00:00 via INTRAVENOUS

## 2014-10-10 MED ORDER — MEPERIDINE HCL 50 MG/ML IJ SOLN
INTRAMUSCULAR | Status: AC
  Filled 2014-10-10: qty 1

## 2014-10-10 MED ORDER — STERILE WATER FOR IRRIGATION IR SOLN
Status: DC | PRN
  Administered 2014-10-10: 08:00:00

## 2014-10-10 NOTE — Discharge Instructions (Signed)
Resume usual medications and high fiber diet. °No driving for 24 hours. °Physician will call with biopsy results. ° °Colonoscopy, Care After °These instructions give you information on caring for yourself after your procedure. Your doctor may also give you more specific instructions. Call your doctor if you have any problems or questions after your procedure. °HOME CARE °· Do not drive for 24 hours. °· Do not sign important papers or use machinery for 24 hours. °· You may shower. °· You may go back to your usual activities, but go slower for the first 24 hours. °· Take rest breaks often during the first 24 hours. °· Walk around or use warm packs on your belly (abdomen) if you have belly cramping or gas. °· Drink enough fluids to keep your pee (urine) clear or pale yellow. °· Resume your normal diet. Avoid heavy or fried foods. °· Avoid drinking alcohol for 24 hours or as told by your doctor. °· Only take medicines as told by your doctor. °If a tissue sample (biopsy) was taken during the procedure:  °· Do not take aspirin or blood thinners for 7 days, or as told by your doctor. °· Do not drink alcohol for 7 days, or as told by your doctor. °· Eat soft foods for the first 24 hours. °GET HELP IF: °You still have a small amount of blood in your poop (stool) 2-3 days after the procedure. °GET HELP RIGHT AWAY IF: °· You have more than a small amount of blood in your poop. °· You see clumps of tissue (blood clots) in your poop. °· Your belly is puffy (swollen). °· You feel sick to your stomach (nauseous) or throw up (vomit). °· You have a fever. °· You have belly pain that gets worse and medicine does not help. °MAKE SURE YOU: °· Understand these instructions. °· Will watch your condition. °· Will get help right away if you are not doing well or get worse. °Document Released: 05/29/2010 Document Revised: 05/01/2013 Document Reviewed: 01/01/2013 °ExitCare® Patient Information ©2015 [employer]. This information is not  intended to replace advice given to you by your health care provider. Make sure you discuss any questions you have with your health care provider. ° ° °High-Fiber Diet °Fiber is found in fruits, vegetables, and grains. A high-fiber diet encourages the addition of more whole grains, legumes, fruits, and vegetables in your diet. The recommended amount of fiber for adult males is 38 g per day. For adult females, it is 25 g per day. Pregnant and lactating women should get 28 g of fiber per day. If you have a digestive or bowel problem, ask your caregiver for advice before adding high-fiber foods to your diet. Eat a variety of high-fiber foods instead of only a select few type of foods.  °PURPOSE °· To increase stool bulk. °· To make bowel movements more regular to prevent constipation. °· To lower cholesterol. °· To prevent overeating. °WHEN IS THIS DIET USED? °· It may be used if you have constipation and hemorrhoids. °· It may be used if you have uncomplicated diverticulosis (intestine condition) and irritable bowel syndrome. °· It may be used if you need help with weight management. °· It may be used if you want to add it to your diet as a protective measure against atherosclerosis, diabetes, and cancer. °SOURCES OF FIBER °· Whole-grain breads and cereals. °· Fruits, such as apples, oranges, bananas, berries, prunes, and pears. °· Vegetables, such as green peas, carrots, sweet potatoes, beets, broccoli, cabbage,   spinach, and artichokes. °· Legumes, such split peas, soy, lentils. °· Almonds. °FIBER CONTENT IN FOODS °Starches and Grains / Dietary Fiber (g) °· Cheerios, 1 cup / 3 g °· Corn Flakes cereal, 1 cup / 0.7 g °· Rice crispy treat cereal, 1¼ cup / 0.3 g °· Instant oatmeal (cooked), ½ cup / 2 g °· Frosted wheat cereal, 1 cup / 5.1 g °· Brown, long-grain rice (cooked), 1 cup / 3.5 g °· White, long-grain rice (cooked), 1 cup / 0.6 g °· Enriched macaroni (cooked), 1 cup / 2.5 g °Legumes / Dietary Fiber (g) °· Baked  beans (canned, plain, or vegetarian), ½ cup / 5.2 g °· Kidney beans (canned), ½ cup / 6.8 g °· Pinto beans (cooked), ½ cup / 5.5 g °Breads and Crackers / Dietary Fiber (g) °· Plain or honey graham crackers, 2 squares / 0.7 g °· Saltine crackers, 3 squares / 0.3 g °· Plain, salted pretzels, 10 pieces / 1.8 g °· Whole-wheat bread, 1 slice / 1.9 g °· White bread, 1 slice / 0.7 g °· Raisin bread, 1 slice / 1.2 g °· Plain bagel, 3 oz / 2 g °· Flour tortilla, 1 oz / 0.9 g °· Corn tortilla, 1 small / 1.5 g °· Hamburger or hotdog bun, 1 small / 0.9 g °Fruits / Dietary Fiber (g) °· Apple with skin, 1 medium / 4.4 g °· Sweetened applesauce, ½ cup / 1.5 g °· Banana, ½ medium / 1.5 g °· Grapes, 10 grapes / 0.4 g °· Orange, 1 small / 2.3 g °· Raisin, 1.5 oz / 1.6 g °· Melon, 1 cup / 1.4 g °Vegetables / Dietary Fiber (g) °· Green beans (canned), ½ cup / 1.3 g °· Carrots (cooked), ½ cup / 2.3 g °· Broccoli (cooked), ½ cup / 2.8 g °· Peas (cooked), ½ cup / 4.4 g °· Mashed potatoes, ½ cup / 1.6 g °· Lettuce, 1 cup / 0.5 g °· Corn (canned), ½ cup / 1.6 g °· Tomato, ½ cup / 1.1 g °Document Released: 04/26/2005 Document Revised: 10/26/2011 Document Reviewed: 07/29/2011 °ExitCare® Patient Information ©2015 [employer]. This information is not intended to replace advice given to you by your health care provider. Make sure you discuss any questions you have with your health care provider. ° °

## 2014-10-10 NOTE — Op Note (Signed)
COLONOSCOPY PROCEDURE REPORT  PATIENT:  Christian Rasmussen  MR#:  [id number] Birthdate:  [birth year], 74 y.o., male Endoscopist:  Dr. Rogene Houston, MD Referred By:  Dr. Monico Blitz, MD Procedure Date: 10/10/2014  Procedure:   Colonoscopy  Indications:  Patient is 74 year old Caucasian male who is here for average risk screening colonoscopy. Patient's last exam 10 years ago was normal.  Informed Consent:  The procedure and risks were reviewed with the patient and informed consent was obtained.  Medications:  Demerol 50 mg IV Versed 7 mg IV  Description of procedure:  After a digital rectal exam was performed, that colonoscope was advanced from the anus through the rectum and colon to the area of the cecum, ileocecal valve and appendiceal orifice. The cecum was deeply intubated. These structures were well-seen and photographed for the record. From the level of the cecum and ileocecal valve, the scope was slowly and cautiously withdrawn. The mucosal surfaces were carefully surveyed utilizing scope tip to flexion to facilitate fold flattening as needed. The scope was pulled down into the rectum where a thorough exam including retroflexion was performed.  Findings:   Prep satisfactory. Small polyp ablated via cold biopsy from ascending colon. Was located above and across ileocecal valve. Scattered diverticula noted at sigmoid colon. Normal rectal mucosa. Prominent hemorrhoids below the dentate line.   Therapeutic/Diagnostic Maneuvers Performed:  See above  Complications:  None  Cecal Withdrawal Time:  8 minutes  Impression:  Examination performed to cecum. Small polyp ablated via cold biopsy from ascending colon.\ Mild sigmoid colon diverticulosis. External hemorrhoids.  Recommendations:  Standard instructions given. I will contact patient with biopsy results and further recommendations.  Dermot Gremillion U  10/10/2014 9:18 AM  CC: Dr. Monico Blitz, MD & Dr. Rayne Du ref. provider  found

## 2014-10-10 NOTE — H&P (Signed)
Christian Rasmussen is an 74 y.o. male.   Chief Complaint: Patient is here for colonoscopy. HPI: Patient is 74 year old Caucasian male who is here for screening colonoscopy. He denies abdominal pain change in bowel habits or rectal bleeding. Last colonoscopy was 10 years colon was normal. Family history is negative for CRC.  Past Medical History  Diagnosis Date  . CAD (coronary artery disease)     status post Taxus stent patency of RCA 2004, Cardiolite negative for ischemia in 2010.  Marland Kitchen Dyslipidemia   . Hypertension   . Right bundle branch block (RBBB) with left anterior hemiblock   . Typical atrial flutter     s/p ablation 11-23-2012 by Dr Rayann Heman  . Overweight   . Diabetes mellitus   . Snores   . History of kidney stones     Past Surgical History  Procedure Laterality Date  . Ankle surgery Left     total ankle replacement  . Ablation  11-23-2012    s/p cavotricuspid isthmus ablation by Dr Rayann Heman  . Coronary angioplasty      3 stents  . Cataract extraction Right   . Cataract extraction w/phaco Left 09/13/2013    Procedure: CATARACT EXTRACTION PHACO AND INTRAOCULAR LENS PLACEMENT (IOC);  Surgeon: Tonny Branch, MD;  Location: [facility];  Service: Ophthalmology;  Laterality: Left;  CDE 9.38  . Atrial flutter ablation N/A 11/23/2012    Procedure: ATRIAL FLUTTER ABLATION;  Surgeon: Thompson Grayer, MD;  Location: [facility] CATH LAB;  Service: Cardiovascular;  Laterality: N/A;    Family History  Problem Relation Age of Onset  . Other Father 45    Died from MI.  . Other Mother     alive & well  . Leukemia Brother 45    died   Social History:  reports that he quit smoking about 29 years ago. His smoking use included Cigarettes. He started smoking about 58 years ago. He has a 28 pack-year smoking history. He has never used smokeless tobacco. He reports that he drinks alcohol. He reports that he does not use illicit drugs.  Allergies: No Known Allergies  Medications Prior to Admission  Medication Sig  Dispense Refill  . aspirin EC 81 MG tablet Take 1 tablet (81 mg total) by mouth daily. 90 tablet 3  . ASTAXANTHIN PO Take 10 mg by mouth daily.    Marland Kitchen atorvastatin (LIPITOR) 40 MG tablet Take 1 tablet by mouth daily.    . Berberine Chloride POWD Take 1,200 mg by mouth daily.    . carvedilol (COREG) 6.25 MG tablet Take 1 tablet twice daily 180 tablet 1  . chlorthalidone (HYGROTON) 25 MG tablet Take 25 mg by mouth daily.    . Coenzyme Q10 (CO Q10) 100 MG CAPS Take 1 tablet by mouth daily.    . fish oil-omega-3 fatty acids 1000 MG capsule Take 2 g by mouth daily.    Marland Kitchen KRILL OIL 1000 MG CAPS Take 1 capsule by mouth daily.     Marland Kitchen lisinopril (PRINIVIL,ZESTRIL) 20 MG tablet Take 1 tablet (20 mg total) by mouth daily. 90 tablet 3  . metFORMIN (GLUCOPHAGE) 500 MG tablet Take 1,000 mg by mouth 2 (two) times daily.   2  . Multiple Vitamin (MULITIVITAMIN WITH MINERALS) TABS Take 1 tablet by mouth daily.    Marland Kitchen OVER THE COUNTER MEDICATION Take 1 capsule by mouth 3 (three) times daily. Glucose Essentials- Chromium, [place] kino tree extract, Gymnema, Tursaline Extract,Vanadyl Sulfate, Banaba Extract    . polyethylene glycol-electrolytes (  NULYTELY/GOLYTELY) 420 G solution Take 4,000 mLs by mouth once. 4000 mL 0    Results for orders placed or performed during the hospital encounter of 10/10/14 (from the past 48 hour(s))  Glucose, capillary     Status: Abnormal   Collection Time: 10/10/14  7:44 AM  Result Value Ref Range   Glucose-Capillary 150 (H) 65 - 99 mg/dL   No results found.  ROS  Blood pressure 153/84, pulse 74, temperature 97.7 F (36.5 C), temperature source Oral, resp. rate 18, height 5\' 11"  (1.803 m), weight 242 lb (109.77 kg), SpO2 95 %. Physical Exam  Constitutional: He appears well-developed and well-nourished.  HENT:  Mouth/Throat: Oropharynx is clear and moist.  Eyes: Conjunctivae are normal. No scleral icterus.  Neck: No thyromegaly present.  Cardiovascular: Normal rate, regular rhythm  and normal heart sounds.   No murmur heard. Respiratory: Effort normal and breath sounds normal.  GI: Soft. He exhibits no distension and no mass. There is no tenderness.  Musculoskeletal: He exhibits no edema.  Lymphadenopathy:    He has no cervical adenopathy.  Neurological: He is alert.  Skin: Skin is warm and dry.     Assessment/Plan Average risk screening colonoscopy.  REHMAN,NAJEEB U 10/10/2014, 8:31 AM

## 2014-10-11 ENCOUNTER — Encounter (HOSPITAL_COMMUNITY): Payer: Self-pay | Admitting: Internal Medicine

## 2014-10-14 ENCOUNTER — Encounter (INDEPENDENT_AMBULATORY_CARE_PROVIDER_SITE_OTHER): Payer: Self-pay | Admitting: *Deleted

## 2014-11-29 ENCOUNTER — Other Ambulatory Visit: Payer: Self-pay | Admitting: Cardiovascular Disease

## 2015-05-30 DIAGNOSIS — E1151 Type 2 diabetes mellitus with diabetic peripheral angiopathy without gangrene: Secondary | ICD-10-CM | POA: Diagnosis not present

## 2015-05-30 DIAGNOSIS — Z87891 Personal history of nicotine dependence: Secondary | ICD-10-CM | POA: Diagnosis not present

## 2015-05-30 DIAGNOSIS — E7800 Pure hypercholesterolemia, unspecified: Secondary | ICD-10-CM | POA: Diagnosis not present

## 2015-05-30 DIAGNOSIS — Z6834 Body mass index (BMI) 34.0-34.9, adult: Secondary | ICD-10-CM | POA: Diagnosis not present

## 2015-06-24 DIAGNOSIS — I10 Essential (primary) hypertension: Secondary | ICD-10-CM | POA: Diagnosis not present

## 2015-06-24 DIAGNOSIS — Z20828 Contact with and (suspected) exposure to other viral communicable diseases: Secondary | ICD-10-CM | POA: Diagnosis not present

## 2015-06-24 DIAGNOSIS — J111 Influenza due to unidentified influenza virus with other respiratory manifestations: Secondary | ICD-10-CM | POA: Diagnosis not present

## 2015-06-24 DIAGNOSIS — R6883 Chills (without fever): Secondary | ICD-10-CM | POA: Diagnosis not present

## 2015-06-27 ENCOUNTER — Ambulatory Visit: Payer: Medicare Other | Admitting: Cardiovascular Disease

## 2015-07-31 ENCOUNTER — Encounter: Payer: Self-pay | Admitting: *Deleted

## 2015-07-31 ENCOUNTER — Encounter: Payer: Self-pay | Admitting: Cardiovascular Disease

## 2015-07-31 ENCOUNTER — Ambulatory Visit (INDEPENDENT_AMBULATORY_CARE_PROVIDER_SITE_OTHER): Payer: Medicare Other | Admitting: Cardiovascular Disease

## 2015-07-31 VITALS — BP 117/72 | HR 75 | Ht 71.0 in | Wt 243.0 lb

## 2015-07-31 DIAGNOSIS — E785 Hyperlipidemia, unspecified: Secondary | ICD-10-CM

## 2015-07-31 DIAGNOSIS — I10 Essential (primary) hypertension: Secondary | ICD-10-CM | POA: Diagnosis not present

## 2015-07-31 DIAGNOSIS — Z9889 Other specified postprocedural states: Secondary | ICD-10-CM

## 2015-07-31 DIAGNOSIS — I2510 Atherosclerotic heart disease of native coronary artery without angina pectoris: Secondary | ICD-10-CM | POA: Diagnosis not present

## 2015-07-31 DIAGNOSIS — I358 Other nonrheumatic aortic valve disorders: Secondary | ICD-10-CM

## 2015-07-31 DIAGNOSIS — Z8679 Personal history of other diseases of the circulatory system: Secondary | ICD-10-CM

## 2015-07-31 NOTE — Patient Instructions (Signed)
Continue all current medications. Your physician wants you to follow up in:  1 year.  You will receive a reminder letter in the mail one-two months in advance.  If you don't receive a letter, please call our office to schedule the follow up appointment   

## 2015-07-31 NOTE — Progress Notes (Signed)
Patient ID: Christian Rasmussen, male   DOB: [birth year], 76 y.o.   MRN: [id number]      SUBJECTIVE: The patient presents for routine follow-up. He has a cardiovascular history significant for atrial flutter ablation, coronary artery disease with previous percutaneous coronary intervention, minimal aortic stenosis, hypertension and hyperlipidemia.  He denies chest pain, lightheadedness, dizziness, palpitations, leg swelling and syncope. He previously told me that when he was in atrial flutter, he never experienced palpitations.  He has not been hospitalized in the past year. He has occasional shortness of breath.  ECG performed in the office today demonstrates normal sinus rhythm with right bundle branch block and left anterior fascicular block.  Fam: His father passed away at 55 of a heart attack and his brother died at the age of 58, and he never knew either of his grandfathers.   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  No Known Allergies  Current Outpatient Prescriptions  Medication Sig Dispense Refill  . aspirin EC 81 MG tablet Take 1 tablet (81 mg total) by mouth daily. 90 tablet 3  . ASTAXANTHIN PO Take 10 mg by mouth daily.    Marland Kitchen atorvastatin (LIPITOR) 40 MG tablet Take 1 tablet by mouth daily.    . Berberine Chloride POWD Take 1,200 mg by mouth daily.    . carvedilol (COREG) 6.25 MG tablet TAKE 1 TABLET TWICE DAILY 180 tablet 3  . chlorthalidone (HYGROTON) 25 MG tablet Take 25 mg by mouth daily.    . Coenzyme Q10 (CO Q10) 100 MG CAPS Take 1 tablet by mouth daily.    . fish oil-omega-3 fatty acids 1000 MG capsule Take 2 g by mouth daily.    Marland Kitchen KRILL OIL 1000 MG CAPS Take 1 capsule by mouth daily.     Marland Kitchen lisinopril (PRINIVIL,ZESTRIL) 20 MG tablet Take 1 tablet (20 mg total) by mouth daily. 90 tablet 3  . metFORMIN (GLUCOPHAGE) 500 MG tablet Take 1,000 mg by mouth 2 (two) times daily.   2  . Multiple Vitamin  (MULITIVITAMIN WITH MINERALS) TABS Take 1 tablet by mouth daily.    Marland Kitchen OVER THE COUNTER MEDICATION Take 1 capsule by mouth 3 (three) times daily. Glucose Essentials- Chromium, [place] kino tree extract, Gymnema, Tursaline Extract,Vanadyl Sulfate, Banaba Extract     No current facility-administered medications for this visit.    Past Medical History  Diagnosis Date  . CAD (coronary artery disease)     status post Taxus stent patency of RCA 2004, Cardiolite negative for ischemia in 2010.  Marland Kitchen Dyslipidemia   . Hypertension   . Right bundle branch block (RBBB) with left anterior hemiblock   . Typical atrial flutter ([place])     s/p ablation 11-23-2012 by Dr Rayann Heman  . Overweight   . Diabetes mellitus ([place])   . Snores   . History of kidney stones     Past Surgical History  Procedure Laterality Date  . Ankle surgery Left     total ankle replacement  . Ablation  11-23-2012    s/p cavotricuspid isthmus ablation by Dr Rayann Heman  . Coronary angioplasty      3 stents  . Cataract extraction Right   . Cataract extraction w/phaco Left 09/13/2013    Procedure: CATARACT EXTRACTION PHACO AND INTRAOCULAR LENS PLACEMENT (IOC);  Surgeon: Tonny Branch, MD;  Location: [facility];  Service: Ophthalmology;  Laterality: Left;  CDE 9.38  . Atrial flutter ablation N/A  11/23/2012    Procedure: ATRIAL FLUTTER ABLATION;  Surgeon: Thompson Grayer, MD;  Location: [facility] CATH LAB;  Service: Cardiovascular;  Laterality: N/A;  . Colonoscopy N/A 10/10/2014    Procedure: COLONOSCOPY;  Surgeon: Rogene Houston, MD;  Location: [facility] SUITE;  Service: Endoscopy;  Laterality: N/A;  830    Social History   Social History  . Marital Status: Married    Spouse Name: N/A  . Number of Children: N/A  . 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 Smoking status: Former Smoker -- 1.00 packs/day for 28 years    Types: Cigarettes    Start date: 10/21/1956    Quit date:  05/10/1985  . Smokeless tobacco: Never Used  . Alcohol Use: 0.0 oz/week    0 Standard drinks or equivalent per week     Comment: occasional wine  . Drug Use: No  . Sexual Activity: Yes    Birth Control/ Protection: None   Other Topics Concern  . Not on file   Social History Narrative   Lives in [place] with spouse.   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 Lungs: Clear to auscultation bilaterally with normal respiratory effort. CV: Nondisplaced PMI. Regular rate and rhythm, normal S1/S2, no S3/S4, II/VI early systolic murmur over RUSB. No pretibial or periankle edema. No carotid bruit.  Abdomen: Soft, nontender, obese, no distention.  Neurologic: Alert and oriented x 3.  Psych: Normal affect. Skin: Normal. Musculoskeletal: No gross deformities. Extremities: No clubbing or cyanosis.   ECG: Most recent ECG reviewed.      ASSESSMENT AND PLAN:  Atrial flutter s/p ablation:  No recurrences. Stable from this standpoint.  Coronary artery disease with stents:  Symptomatically stable. Will continue with aggressive risk reduction and medications as listed, which include ASA, Lipitor, Coreg, chlorthalidone, and lisinopril.   Aortic sclerosis:  No evidence of actual stenosis by echo in 09/2012. No indication for imaging at this time.  Essential hypertension:  Controlled on present therapy. No changes.  Hyperlipidemia: Will obtain copy of lipids. Continue Lipitor 40 mg daily.  Dispo: f/u 1 year.    Kate Sable, M.D., F.A.C.C.

## 2015-09-03 DIAGNOSIS — I4891 Unspecified atrial fibrillation: Secondary | ICD-10-CM | POA: Diagnosis not present

## 2015-09-03 DIAGNOSIS — I10 Essential (primary) hypertension: Secondary | ICD-10-CM | POA: Diagnosis not present

## 2015-09-03 DIAGNOSIS — E7800 Pure hypercholesterolemia, unspecified: Secondary | ICD-10-CM | POA: Diagnosis not present

## 2015-09-04 DIAGNOSIS — E1151 Type 2 diabetes mellitus with diabetic peripheral angiopathy without gangrene: Secondary | ICD-10-CM | POA: Diagnosis not present

## 2015-09-04 DIAGNOSIS — E7800 Pure hypercholesterolemia, unspecified: Secondary | ICD-10-CM | POA: Diagnosis not present

## 2015-09-12 DIAGNOSIS — E1165 Type 2 diabetes mellitus with hyperglycemia: Secondary | ICD-10-CM | POA: Diagnosis not present

## 2015-09-12 DIAGNOSIS — E119 Type 2 diabetes mellitus without complications: Secondary | ICD-10-CM | POA: Diagnosis not present

## 2015-10-12 ENCOUNTER — Encounter (HOSPITAL_COMMUNITY): Payer: Self-pay | Admitting: Emergency Medicine

## 2015-10-12 ENCOUNTER — Inpatient Hospital Stay (HOSPITAL_COMMUNITY)
Admission: EM | Admit: 2015-10-12 | Discharge: 2015-10-24 | DRG: 216 | Disposition: A | Discharge status: Home Health | Payer: Medicare Other | Attending: Thoracic Surgery (Cardiothoracic Vascular Surgery) | Admitting: Thoracic Surgery (Cardiothoracic Vascular Surgery)

## 2015-10-12 ENCOUNTER — Emergency Department (HOSPITAL_COMMUNITY): Payer: Medicare Other

## 2015-10-12 DIAGNOSIS — I444 Left anterior fascicular block: Secondary | ICD-10-CM | POA: Diagnosis present

## 2015-10-12 DIAGNOSIS — I214 Non-ST elevation (NSTEMI) myocardial infarction: Secondary | ICD-10-CM | POA: Diagnosis not present

## 2015-10-12 DIAGNOSIS — I25118 Atherosclerotic heart disease of native coronary artery with other forms of angina pectoris: Secondary | ICD-10-CM | POA: Diagnosis present

## 2015-10-12 DIAGNOSIS — J9811 Atelectasis: Secondary | ICD-10-CM | POA: Diagnosis not present

## 2015-10-12 DIAGNOSIS — Z7982 Long term (current) use of aspirin: Secondary | ICD-10-CM

## 2015-10-12 DIAGNOSIS — E785 Hyperlipidemia, unspecified: Secondary | ICD-10-CM | POA: Diagnosis not present

## 2015-10-12 DIAGNOSIS — I350 Nonrheumatic aortic (valve) stenosis: Secondary | ICD-10-CM | POA: Diagnosis not present

## 2015-10-12 DIAGNOSIS — I5033 Acute on chronic diastolic (congestive) heart failure: Secondary | ICD-10-CM | POA: Diagnosis not present

## 2015-10-12 DIAGNOSIS — I340 Nonrheumatic mitral (valve) insufficiency: Secondary | ICD-10-CM | POA: Diagnosis not present

## 2015-10-12 DIAGNOSIS — Z951 Presence of aortocoronary bypass graft: Secondary | ICD-10-CM

## 2015-10-12 DIAGNOSIS — I2510 Atherosclerotic heart disease of native coronary artery without angina pectoris: Secondary | ICD-10-CM | POA: Diagnosis present

## 2015-10-12 DIAGNOSIS — I059 Rheumatic mitral valve disease, unspecified: Secondary | ICD-10-CM | POA: Diagnosis present

## 2015-10-12 DIAGNOSIS — I4892 Unspecified atrial flutter: Secondary | ICD-10-CM | POA: Diagnosis not present

## 2015-10-12 DIAGNOSIS — Z7984 Long term (current) use of oral hypoglycemic drugs: Secondary | ICD-10-CM

## 2015-10-12 DIAGNOSIS — E876 Hypokalemia: Secondary | ICD-10-CM | POA: Diagnosis present

## 2015-10-12 DIAGNOSIS — R001 Bradycardia, unspecified: Secondary | ICD-10-CM | POA: Diagnosis not present

## 2015-10-12 DIAGNOSIS — I2584 Coronary atherosclerosis due to calcified coronary lesion: Secondary | ICD-10-CM | POA: Diagnosis present

## 2015-10-12 DIAGNOSIS — I200 Unstable angina: Secondary | ICD-10-CM | POA: Diagnosis not present

## 2015-10-12 DIAGNOSIS — Z955 Presence of coronary angioplasty implant and graft: Secondary | ICD-10-CM | POA: Diagnosis not present

## 2015-10-12 DIAGNOSIS — I25110 Atherosclerotic heart disease of native coronary artery with unstable angina pectoris: Secondary | ICD-10-CM

## 2015-10-12 DIAGNOSIS — I4519 Other right bundle-branch block: Secondary | ICD-10-CM | POA: Diagnosis present

## 2015-10-12 DIAGNOSIS — R0789 Other chest pain: Secondary | ICD-10-CM | POA: Diagnosis not present

## 2015-10-12 DIAGNOSIS — I351 Nonrheumatic aortic (valve) insufficiency: Secondary | ICD-10-CM | POA: Diagnosis not present

## 2015-10-12 DIAGNOSIS — I080 Rheumatic disorders of both mitral and aortic valves: Secondary | ICD-10-CM | POA: Diagnosis present

## 2015-10-12 DIAGNOSIS — D62 Acute posthemorrhagic anemia: Secondary | ICD-10-CM | POA: Diagnosis not present

## 2015-10-12 DIAGNOSIS — I359 Nonrheumatic aortic valve disorder, unspecified: Secondary | ICD-10-CM | POA: Diagnosis not present

## 2015-10-12 DIAGNOSIS — R4587 Impulsiveness: Secondary | ICD-10-CM | POA: Diagnosis present

## 2015-10-12 DIAGNOSIS — E669 Obesity, unspecified: Secondary | ICD-10-CM | POA: Diagnosis present

## 2015-10-12 DIAGNOSIS — I110 Hypertensive heart disease with heart failure: Secondary | ICD-10-CM | POA: Diagnosis present

## 2015-10-12 DIAGNOSIS — Z9889 Other specified postprocedural states: Secondary | ICD-10-CM

## 2015-10-12 DIAGNOSIS — Z8249 Family history of ischemic heart disease and other diseases of the circulatory system: Secondary | ICD-10-CM | POA: Diagnosis not present

## 2015-10-12 DIAGNOSIS — I10 Essential (primary) hypertension: Secondary | ICD-10-CM

## 2015-10-12 DIAGNOSIS — Z87891 Personal history of nicotine dependence: Secondary | ICD-10-CM | POA: Diagnosis not present

## 2015-10-12 DIAGNOSIS — R410 Disorientation, unspecified: Secondary | ICD-10-CM | POA: Diagnosis not present

## 2015-10-12 DIAGNOSIS — I440 Atrioventricular block, first degree: Secondary | ICD-10-CM | POA: Diagnosis present

## 2015-10-12 DIAGNOSIS — R079 Chest pain, unspecified: Secondary | ICD-10-CM | POA: Diagnosis not present

## 2015-10-12 DIAGNOSIS — R011 Cardiac murmur, unspecified: Secondary | ICD-10-CM | POA: Diagnosis not present

## 2015-10-12 DIAGNOSIS — Z950 Presence of cardiac pacemaker: Secondary | ICD-10-CM

## 2015-10-12 DIAGNOSIS — I442 Atrioventricular block, complete: Secondary | ICD-10-CM | POA: Diagnosis not present

## 2015-10-12 DIAGNOSIS — Z4682 Encounter for fitting and adjustment of non-vascular catheter: Secondary | ICD-10-CM | POA: Diagnosis not present

## 2015-10-12 DIAGNOSIS — T502X5A Adverse effect of carbonic-anhydrase inhibitors, benzothiadiazides and other diuretics, initial encounter: Secondary | ICD-10-CM | POA: Diagnosis present

## 2015-10-12 DIAGNOSIS — E119 Type 2 diabetes mellitus without complications: Secondary | ICD-10-CM | POA: Diagnosis present

## 2015-10-12 DIAGNOSIS — Z6834 Body mass index (BMI) 34.0-34.9, adult: Secondary | ICD-10-CM | POA: Diagnosis not present

## 2015-10-12 DIAGNOSIS — J811 Chronic pulmonary edema: Secondary | ICD-10-CM | POA: Diagnosis not present

## 2015-10-12 DIAGNOSIS — J439 Emphysema, unspecified: Secondary | ICD-10-CM | POA: Diagnosis not present

## 2015-10-12 DIAGNOSIS — Z09 Encounter for follow-up examination after completed treatment for conditions other than malignant neoplasm: Secondary | ICD-10-CM

## 2015-10-12 DIAGNOSIS — Z452 Encounter for adjustment and management of vascular access device: Secondary | ICD-10-CM | POA: Diagnosis not present

## 2015-10-12 DIAGNOSIS — R5381 Other malaise: Secondary | ICD-10-CM | POA: Diagnosis not present

## 2015-10-12 DIAGNOSIS — Z9689 Presence of other specified functional implants: Secondary | ICD-10-CM

## 2015-10-12 DIAGNOSIS — I452 Bifascicular block: Secondary | ICD-10-CM | POA: Diagnosis present

## 2015-10-12 HISTORY — DX: Presence of aortocoronary bypass graft: Z95.1

## 2015-10-12 HISTORY — DX: Nonrheumatic aortic (valve) stenosis: I35.0

## 2015-10-12 HISTORY — DX: Nonrheumatic aortic (valve) insufficiency: I35.1

## 2015-10-12 HISTORY — DX: Nonrheumatic mitral (valve) insufficiency: I34.0

## 2015-10-12 HISTORY — DX: Other specified postprocedural states: Z98.890

## 2015-10-12 LAB — COMPREHENSIVE METABOLIC PANEL
ALBUMIN: 3.9 g/dL (ref 3.5–5.0)
ALT: 27 U/L (ref 17–63)
AST: 22 U/L (ref 15–41)
Alkaline Phosphatase: 55 U/L (ref 38–126)
Anion gap: 10 (ref 5–15)
BILIRUBIN TOTAL: 0.6 mg/dL (ref 0.3–1.2)
BUN: 30 mg/dL — AB (ref 6–20)
CALCIUM: 9.4 mg/dL (ref 8.9–10.3)
CO2: 25 mmol/L (ref 22–32)
Chloride: 103 mmol/L (ref 101–111)
Creatinine, Ser: 0.77 mg/dL (ref 0.61–1.24)
GFR calc Af Amer: >60 mL/min (ref >60)
GFR calc non Af Amer: >60 mL/min (ref >60)
Glucose, Bld: 205 mg/dL — ABNORMAL HIGH (ref 65–99)
Potassium: 3.6 mmol/L (ref 3.5–5.1)
SODIUM: 138 mmol/L (ref 135–145)
TOTAL PROTEIN: 7 g/dL (ref 6.5–8.1)

## 2015-10-12 LAB — TROPONIN I
TROPONIN I: 0.08 ng/mL — AB (ref <0.031)
TROPONIN I: 0.08 ng/mL — AB (ref <0.031)
Troponin I: 0.09 ng/mL — ABNORMAL HIGH (ref <0.031)

## 2015-10-12 LAB — GLUCOSE, CAPILLARY
GLUCOSE-CAPILLARY: 222 mg/dL — AB (ref 65–99)
Glucose-Capillary: 190 mg/dL — ABNORMAL HIGH (ref 65–99)

## 2015-10-12 LAB — CBC
HEMATOCRIT: 42.1 % (ref 39.0–52.0)
HEMOGLOBIN: 13.9 g/dL (ref 13.0–17.0)
MCH: 30.3 pg (ref 26.0–34.0)
MCHC: 33 g/dL (ref 30.0–36.0)
MCV: 91.7 fL (ref 78.0–100.0)
Platelets: 262 10*3/uL (ref 150–400)
RBC: 4.59 MIL/uL (ref 4.22–5.81)
RDW: 13.8 % (ref 11.5–15.5)
WBC: 9.2 10*3/uL (ref 4.0–10.5)

## 2015-10-12 LAB — PROTIME-INR
INR: 0.97 (ref 0.00–1.49)
Prothrombin Time: 13.1 seconds (ref 11.6–15.2)

## 2015-10-12 LAB — HEPARIN LEVEL (UNFRACTIONATED)
HEPARIN UNFRACTIONATED: 0.24 [IU]/mL — AB (ref 0.30–0.70)
Heparin Unfractionated: 0.22 IU/mL — ABNORMAL LOW (ref 0.30–0.70)

## 2015-10-12 MED ORDER — SODIUM CHLORIDE 0.9 % WEIGHT BASED INFUSION
1.0000 mL/kg/h | INTRAVENOUS | Status: DC
  Administered 2015-10-13: 1 mL/kg/h via INTRAVENOUS

## 2015-10-12 MED ORDER — ASPIRIN 81 MG PO CHEW
324.0000 mg | CHEWABLE_TABLET | Freq: Once | ORAL | Status: AC
  Administered 2015-10-12: 324 mg via ORAL
  Filled 2015-10-12: qty 4

## 2015-10-12 MED ORDER — ASPIRIN EC 81 MG PO TBEC
81.0000 mg | DELAYED_RELEASE_TABLET | Freq: Every day | ORAL | Status: DC
  Administered 2015-10-13 – 2015-10-14 (×2): 81 mg via ORAL
  Filled 2015-10-12 (×2): qty 1

## 2015-10-12 MED ORDER — HEPARIN (PORCINE) IN NACL 100-0.45 UNIT/ML-% IJ SOLN
12.0000 [IU]/kg/h | INTRAMUSCULAR | Status: DC
  Administered 2015-10-12: 12 [IU]/kg/h via INTRAVENOUS
  Filled 2015-10-12: qty 250

## 2015-10-12 MED ORDER — NITROGLYCERIN 0.4 MG SL SUBL: 0.4000 mg | SUBLINGUAL_TABLET | SUBLINGUAL | Status: DC | PRN

## 2015-10-12 MED ORDER — ATORVASTATIN CALCIUM 40 MG PO TABS
40.0000 mg | ORAL_TABLET | Freq: Every day | ORAL | Status: DC
  Administered 2015-10-13 – 2015-10-14 (×2): 40 mg via ORAL
  Filled 2015-10-12 (×2): qty 1

## 2015-10-12 MED ORDER — INSULIN ASPART 100 UNIT/ML ~~LOC~~ SOLN
0.0000 [IU] | Freq: Three times a day (TID) | SUBCUTANEOUS | Status: DC
  Administered 2015-10-13 – 2015-10-14 (×2): 2 [IU] via SUBCUTANEOUS
  Administered 2015-10-14 (×2): 3 [IU] via SUBCUTANEOUS

## 2015-10-12 MED ORDER — ONDANSETRON HCL 4 MG/2ML IJ SOLN: 4.0000 mg | Freq: Three times a day (TID) | INTRAMUSCULAR | Status: AC | PRN

## 2015-10-12 MED ORDER — ASPIRIN 81 MG PO CHEW
81.0000 mg | CHEWABLE_TABLET | ORAL | Status: AC
  Administered 2015-10-13: 81 mg via ORAL
  Filled 2015-10-12: qty 1

## 2015-10-12 MED ORDER — ACETAMINOPHEN 325 MG PO TABS: 650.0000 mg | ORAL_TABLET | ORAL | Status: DC | PRN

## 2015-10-12 MED ORDER — HEPARIN (PORCINE) IN NACL 100-0.45 UNIT/ML-% IJ SOLN
1650.0000 [IU]/h | INTRAMUSCULAR | Status: DC
  Administered 2015-10-12: 1550 [IU]/h via INTRAVENOUS
  Filled 2015-10-12: qty 250

## 2015-10-12 MED ORDER — CARVEDILOL 6.25 MG PO TABS
6.2500 mg | ORAL_TABLET | Freq: Two times a day (BID) | ORAL | Status: DC
  Administered 2015-10-12 – 2015-10-14 (×5): 6.25 mg via ORAL
  Filled 2015-10-12 (×5): qty 1

## 2015-10-12 MED ORDER — LISINOPRIL 20 MG PO TABS
20.0000 mg | ORAL_TABLET | Freq: Every day | ORAL | Status: DC
  Administered 2015-10-13 – 2015-10-14 (×2): 20 mg via ORAL
  Filled 2015-10-12 (×2): qty 1

## 2015-10-12 MED ORDER — HEPARIN BOLUS VIA INFUSION
4000.0000 [IU] | Freq: Once | INTRAVENOUS | Status: AC
  Administered 2015-10-12: 4000 [IU] via INTRAVENOUS

## 2015-10-12 MED ORDER — SODIUM CHLORIDE 0.9 % WEIGHT BASED INFUSION
3.0000 mL/kg/h | INTRAVENOUS | Status: DC
  Administered 2015-10-13: 3 mL/kg/h via INTRAVENOUS

## 2015-10-12 MED ORDER — NITROGLYCERIN 2 % TD OINT
0.5000 [in_us] | TOPICAL_OINTMENT | Freq: Once | TRANSDERMAL | Status: AC
  Administered 2015-10-12: 0.5 [in_us] via TOPICAL
  Filled 2015-10-12: qty 1

## 2015-10-12 NOTE — ED Provider Notes (Signed)
CSN: [id number]     Arrival date & time 10/12/15  [id number] History  By signing my name below, I, Eustaquio Maize, attest that this documentation has been prepared under the direction and in the presence of Noemi Chapel, MD. Electronically Signed: Eustaquio Maize, ED Scribe. 10/12/2015. 9:28 AM.   Chief Complaint  Patient presents with  . Chest Pain   The history is provided by the patient. No language interpreter was used.    HPI Comments: Christian Rasmussen is a 75 y.o. male with PMHx CAD s/p 3 stents, HLD, HTN, and DM who presents to the Emergency Department complaining of gradual onset, intermittent, chest tightness x a couple of days. Pt reports that the chest tightness is only present with exertion. He is currently chest pain free. Pt also complains of dyspnea on exertion for the past 2 weeks. 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 Pt was seen by his cardiologist, Dr. Bronson Ing, 2 weeks ago for an annual check up with no acute findings. He was not having any of his symptoms at time of check up. Pt had 3 stents placed 10 years ago after having chest pain with exertion. He has not had any issues with chest pain since stent placement until a couple of days ago. Pt takes 81 mg Aspirin every day. Denies nausea, vomiting, leg swelling, diaphoresis, or any other associated symptoms. His sugars typically run in the 50's which he checks every morning. His A1C was 7.3 2 weeks ago. Pt is a former smoker who quit 30 years ago.   Past Medical History  Diagnosis Date  . CAD (coronary artery disease)     status post Taxus stent patency of RCA 2004, Cardiolite negative for ischemia in 2010.  Marland Kitchen Dyslipidemia   . Hypertension   . Right bundle branch block (RBBB) with left anterior hemiblock   . Typical atrial flutter ([place])     s/p ablation 11-23-2012 by Dr Rayann Heman  . Overweight   . Diabetes mellitus ([place])   . Snores   . History of kidney stones    Past Surgical History  Procedure  Laterality Date  . Ankle surgery Left     total ankle replacement  . Ablation  11-23-2012    s/p cavotricuspid isthmus ablation by Dr Rayann Heman  . Coronary angioplasty      3 stents  . Cataract extraction Right   . Cataract extraction w/phaco Left 09/13/2013    Procedure: CATARACT EXTRACTION PHACO AND INTRAOCULAR LENS PLACEMENT (IOC);  Surgeon: Tonny Branch, MD;  Location: [facility];  Service: Ophthalmology;  Laterality: Left;  CDE 9.38  . Atrial flutter ablation N/A 11/23/2012    Procedure: ATRIAL FLUTTER ABLATION;  Surgeon: Thompson Grayer, MD;  Location: [facility] CATH LAB;  Service: Cardiovascular;  Laterality: N/A;  . Colonoscopy N/A 10/10/2014    Procedure: COLONOSCOPY;  Surgeon: Rogene Houston, MD;  Location: [facility];  Service: Endoscopy;  Laterality: N/A;  5   Family History  Problem Relation Age of Onset  . Other Father 69    Died from MI.  . Other Mother     alive & well  . Leukemia Brother 24    died   Social History  Substance Use Topics  . Smoking status: Former Smoker -- 1.00 packs/day for 28 years    Types: Cigarettes    Start date: 10/21/1956    Quit date: 05/10/1985  . Smokeless tobacco: Never Used  . Alcohol Use:  0.0 oz/week    0 Standard drinks or equivalent per week     Comment: occasional wine    Review of Systems  Constitutional: Negative for diaphoresis.  Respiratory: Positive for shortness of breath.   Cardiovascular: Positive for chest pain. Negative for leg swelling.  Gastrointestinal: Negative for nausea and vomiting.  All other systems reviewed and are negative.  Allergies  Review of patient's allergies indicates no known allergies.  Home Medications   Prior to Admission medications   Medication Sig Start Date End Date Taking? Authorizing Provider  aspirin EC 81 MG tablet Take 1 tablet (81 mg total) by mouth daily. 01/04/13  Yes Thompson Grayer, MD  ASTAXANTHIN PO Take 10 mg by mouth daily.   Yes Historical Provider, MD  atorvastatin (LIPITOR) 40 MG tablet  Take 1 tablet by mouth daily. 02/19/13  Yes Historical Provider, MD  Berberine Chloride POWD Take 1,200 mg by mouth daily.   Yes Historical Provider, MD  carvedilol (COREG) 6.25 MG tablet TAKE 1 TABLET TWICE DAILY 12/02/14  Yes Herminio Commons, MD  chlorthalidone (HYGROTON) 25 MG tablet Take 25 mg by mouth daily.   Yes Historical Provider, MD  Coenzyme Q10 (CO Q10) 100 MG CAPS Take 1 tablet by mouth daily.   Yes Historical Provider, MD  fish oil-omega-3 fatty acids 1000 MG capsule Take 2 g by mouth daily.   Yes Historical Provider, MD  KRILL OIL 1000 MG CAPS Take 1 capsule by mouth daily.    Yes Historical Provider, MD  lisinopril (PRINIVIL,ZESTRIL) 20 MG tablet Take 1 tablet (20 mg total) by mouth daily. 03/12/11  Yes Ezra Sites, MD  metFORMIN (GLUCOPHAGE) 500 MG tablet Take 1,000 mg by mouth 2 (two) times daily.  08/01/14  Yes Historical Provider, MD  Multiple Vitamin (MULITIVITAMIN WITH MINERALS) TABS Take 1 tablet by mouth daily.   Yes Historical Provider, MD  OVER THE COUNTER MEDICATION Take 1 capsule by mouth 3 (three) times daily. Glucose Essentials- Chromium, [place] kino tree extract, Gymnema, Tursaline Extract,Vanadyl Sulfate, Banaba Extract   Yes Historical Provider, MD   BP 132/64 mmHg  Pulse 68  Temp(Src) 97.9 F (36.6 C) (Oral)  Resp 14  Ht 5\' 11"  (1.803 m)  Wt 244 lb (110.678 kg)  BMI 34.05 kg/m2  SpO2 95%   Physical Exam  Constitutional: He is oriented to person, place, and time. He appears well-developed and well-nourished.  HENT:  Head: Normocephalic and atraumatic.  Eyes: EOM are normal.  Neck: Normal range of motion.  Cardiovascular: Normal rate, regular rhythm and intact distal pulses.   Murmur heard.  Systolic murmur is present  Systolic murmur heard best at the right upper sternal border.  Pulmonary/Chest: Effort normal and breath sounds normal. No respiratory distress.  Abdominal: Soft. He exhibits no distension. There is no tenderness.  Obese abdomen   Musculoskeletal: Normal range of motion.  Neurological: He is alert and oriented to person, place, and time.  Skin: Skin is warm and dry.  Psychiatric: He has a normal mood and affect. Judgment normal.  Nursing note and vitals reviewed.   ED Course  Procedures (including critical care time)  DIAGNOSTIC STUDIES: Oxygen Saturation is 96% on RA, normal by my interpretation.    COORDINATION OF CARE: 9:24 AM-Discussed treatment plan which includes CBC, Troponin, CMP with pt at bedside and pt agreed to plan.   Labs Review Labs Reviewed  COMPREHENSIVE METABOLIC PANEL - Abnormal; Notable for the following:    Glucose, Bld 205 (*)  BUN 30 (*)    All other components within normal limits  TROPONIN I - Abnormal; Notable for the following:    Troponin I 0.09 (*)    All other components within normal limits  CBC  PROTIME-INR    Imaging Review Dg Chest Portable 1 View  10/12/2015  CLINICAL DATA:  Chest pain, dyspnea for 2 weeks EXAM: PORTABLE CHEST 1 VIEW COMPARISON:  05/26/2013 FINDINGS: Stable hyperinflation without focal pneumonia, collapse, or consolidation. Normal heart size and vascularity. No edema, effusion or pneumothorax. Trachea midline. Aortic atherosclerosis evident. Monitor leads overlie the chest. IMPRESSION: Stable hyperinflation and cardiomegaly. No interval change or acute process. Electronically Signed   By: Jerilynn Mages.  Shick M.D.   On: 10/12/2015 10:01   I have personally reviewed and evaluated these images and lab results as part of my medical decision-making.   EKG Interpretation   Date/Time:  Sunday October 12 2015 09:19:50 EDT Ventricular Rate:  85 PR Interval:  190 QRS Duration: 123 QT Interval:  421 QTC Calculation: 501 R Axis:   -56 Text Interpretation:  Sinus rhythm IVCD, consider atypical RBBB LVH with  IVCD and secondary repol abnrm Prolonged QT interval since last tracing no  significant change Abnormal ekg Confirmed by Camrie Stock  MD, Orra Nolde ([id number]) on  10/12/2015  10:37:45 AM      MDM   Final diagnoses:  NSTEMI (non-ST elevated myocardial infarction) ([place])    I personally performed the services described in this documentation, which was scribed in my presence. The recorded information has been reviewed and is accurate.    The pt symptoms are concerning for UA Labs, Xray, ECG  ECG without acute ischemia - unchanged - RBBB and LVH VS with mild htn CXR clear, cardiomegaly  Trop is elevated (borderline) - given sx of UA - may be NSTEMI D/w Dr. Sallyanne Kuster who accepts in transfer to higher level of care - step down Heparin drip ordered Critical care provided.  CRITICAL CARE Performed by: Johnna Acosta Total critical care time: 35 minutes Critical care time was exclusive of separately billable procedures and treating other patients. Critical care was necessary to treat or prevent imminent or life-threatening deterioration. Critical care was time spent personally by me on the following activities: development of treatment plan with patient and/or surrogate as well as nursing, discussions with consultants, evaluation of patient's response to treatment, examination of patient, obtaining history from patient or surrogate, ordering and performing treatments and interventions, ordering and review of laboratory studies, ordering and review of radiographic studies, pulse oximetry and re-evaluation of patient's condition.   Medications  heparin bolus via infusion 4,000 Units (not administered)  heparin ADULT infusion 100 units/mL (25000 units/249mL sodium chloride 0.45%) (not administered)  nitroGLYCERIN (NITROGLYN) 2 % ointment 0.5 inch (not administered)  aspirin chewable tablet 324 mg (324 mg Oral Given 10/12/15 1012)     Noemi Chapel, MD 10/12/15 1108

## 2015-10-12 NOTE — ED Notes (Signed)
Patient c/o mid-sternal chest pain/tightness with shortness of breath only upon exertion. Per patient pain x2-3 weeks. Patient has hx of stents x3 which is similar to this pain. Patient took 1 baby aspirin this morning.

## 2015-10-12 NOTE — H&P (Signed)
History & Physical    Patient ID: Christian Rasmussen MRN: [id number], DOB/AGE: Dec 04, 1940   Admit date: 10/12/2015  Primary Physician: Monico Blitz, MD Primary Cardiologist: Dr. Bronson Ing  History of Present Illness    Christian Rasmussen is a 75 y.o. male with past medical history of CAD (s/p PCI to RCA in 2004), atrial flutter (s/p ablation 2014), HTN, Type 2 DM, and HLD who presented to [facility] on 10/12/2015 for evaluation of chest pain.  Patient reports he was seen by Dr. Bronson Ing a few months ago and was doing well. Over the past 2-3 weeks, he has been developing worsening dyspnea with exertion associated with mild chest discomfort. 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 Relieved with rest. Has not taken any SL NTG due to not having an Rx. Reports his symptoms feel similar to 2004 when he required stent placement, for his main presenting symptom at that time was new onset dyspnea with physical exertion while doing yard work.  He denies any recent increase in his symptoms today. Says he informed his wife of the worsening dyspnea yesterday and she made him seek evaluation today.  While at [facility], his EKG showed NSR, HR 85 with RBBB. Initial troponin elevated to 0.09. BMET with no significant electrolyte abnormalities, glucose elevated to 205, and creatinine stable at 0.77.CBC with no significant abnormalities.  He was therefore transferred to [facility] for further evaluation.   Past Medical History    Past Medical History  Diagnosis Date  . CAD (coronary artery disease)     status post Taxus stent patency of RCA 2004, Cardiolite negative for ischemia in 2010.  Marland Kitchen Dyslipidemia   . Hypertension   . Right bundle branch block (RBBB) with left anterior hemiblock   . Typical atrial flutter ([place])     s/p ablation 11-23-2012 by Dr Rayann Heman  . Overweight   . Diabetes mellitus ([place])   . Snores   . History of kidney stones     Past Surgical History    Procedure Laterality Date  . Ankle surgery Left     total ankle replacement  . Ablation  11-23-2012    s/p cavotricuspid isthmus ablation by Dr Rayann Heman  . Coronary angioplasty      3 stents  . Cataract extraction Right   . Cataract extraction w/phaco Left 09/13/2013    Procedure: CATARACT EXTRACTION PHACO AND INTRAOCULAR LENS PLACEMENT (IOC);  Surgeon: Tonny Branch, MD;  Location: [facility] ORS;  Service: Ophthalmology;  Laterality: Left;  CDE 9.38  . Atrial flutter ablation N/A 11/23/2012    Procedure: ATRIAL FLUTTER ABLATION;  Surgeon: Thompson Grayer, MD;  Location: [facility] CATH LAB;  Service: Cardiovascular;  Laterality: N/A;  . Colonoscopy N/A 10/10/2014    Procedure: COLONOSCOPY;  Surgeon: Rogene [place], MD;  Location: [facility] ENDO SUITE;  Service: Endoscopy;  Laterality: N/A;  830     Allergies  No Known Allergies   Home Medications    Prior to Admission medications   Medication Sig Start Date End Date Taking? Authorizing Provider  aspirin EC 81 MG tablet Take 1 tablet (81 mg total) by mouth daily. 01/04/13  Yes Thompson Grayer, MD  ASTAXANTHIN PO Take 10 mg by mouth daily.   Yes Historical Provider, MD  atorvastatin (LIPITOR) 40 MG tablet Take 1 tablet by mouth daily. 02/19/13  Yes Historical Provider, MD  Berberine Chloride POWD Take 1,200 mg by mouth daily.   Yes Historical Provider, MD  carvedilol (COREG) 6.25 MG tablet TAKE 1 TABLET TWICE DAILY 12/02/14  Yes Herminio Commons, MD  chlorthalidone (HYGROTON) 25 MG tablet Take 25 mg by mouth daily.   Yes Historical Provider, MD  Coenzyme Q10 (CO Q10) 100 MG CAPS Take 1 tablet by mouth daily.   Yes Historical Provider, MD  fish oil-omega-3 fatty acids 1000 MG capsule Take 2 g by mouth daily.   Yes Historical Provider, MD  KRILL OIL 1000 MG CAPS Take 1 capsule by mouth daily.    Yes Historical Provider, MD  lisinopril (PRINIVIL,ZESTRIL) 20 MG tablet Take 1 tablet (20 mg total) by mouth daily. 03/12/11  Yes Ezra Sites, MD  metFORMIN (GLUCOPHAGE) 500  MG tablet Take 1,000 mg by mouth 2 (two) times daily.  08/01/14  Yes Historical Provider, MD  Multiple Vitamin (MULITIVITAMIN WITH MINERALS) TABS Take 1 tablet by mouth daily.   Yes Historical Provider, MD  OVER THE COUNTER MEDICATION Take 1 capsule by mouth 3 (three) times daily. Glucose Essentials- Chromium, [place] kino tree extract, Gymnema, Tursaline Extract,Vanadyl Sulfate, Banaba Extract   Yes Historical Provider, MD    Family History    Family History  Problem Relation Age of Onset  . Other Father 1    Died from MI.  . Other Mother     alive & well  . Leukemia Brother 16    died    Social History    Social History   Social History  . Marital Status: Married    Spouse Name: N/A  . Number of Children: N/A  . 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 Smoking status: Former Smoker -- 1.00 packs/day for 28 years    Types: Cigarettes    Start date: 10/21/1956    Quit date: 05/10/1985  . Smokeless tobacco: Never Used  . Alcohol Use: 0.0 oz/week    0 Standard drinks or equivalent per week     Comment: occasional wine  . Drug Use: No  . Sexual Activity: Yes    Birth Control/ Protection: None   Other Topics Concern  . Not on file   Social History Narrative   Lives in [place] with spouse.   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  Cardiovascular:  No edema, orthopnea, palpitations, paroxysmal nocturnal dyspnea. Positive for chest pain and dyspnea on exertion. Dermatological: No rash, lesions/masses Respiratory: No cough, dyspnea Urologic: No hematuria, dysuria Abdominal:   No nausea, vomiting, diarrhea, bright red blood per rectum, melena, or hematemesis Neurologic:  No visual changes, wkns, changes in mental status. All other systems reviewed and are otherwise negative except as noted above.  Physical Exam    Blood  pressure 155/67, pulse 68, temperature 97.7 F (36.5 C), temperature source Oral, resp. rate 16, height 5\' 11"  (1.803 m), weight 242 lb 3.2 oz (109.861 kg), SpO2 97 %.  General: Well developed, well nourished,male appearing in no acute distress. Head: Normocephalic, atraumatic, sclera non-icteric, no xanthomas, nares are without discharge. Dentition:  Neck: No carotid bruits. JVD not elevated.  Lungs: Respirations regular and unlabored, without wheezes or rales.  Heart: Regular rate and rhythm. No S3 or S4.  No rubs, or gallops appreciated. 2/6 SEM appreciated at RUSB. Abdomen: Soft, non-tender, non-distended with normoactive bowel sounds. No hepatomegaly. No rebound/guarding. No obvious abdominal masses. Msk:  Strength and tone appear normal for age. No joint deformities or effusions. Extremities: No clubbing or cyanosis. No edema.  Distal pedal pulses are 2+ bilaterally. Neuro: Alert and oriented X 3. Moves all extremities spontaneously. No focal deficits noted. Psych:  Responds to questions appropriately with a normal affect. Skin: No rashes or lesions noted  Labs    Troponin (Point of Care Test) No results for input(s): TROPIPOC in the last 72 hours.  Recent Labs  10/12/15 0930  TROPONINI 0.09*   Lab Results  Component Value Date   WBC 9.2 10/12/2015   HGB 13.9 10/12/2015   HCT 42.1 10/12/2015   MCV 91.7 10/12/2015   PLT 262 10/12/2015    Recent Labs Lab 10/12/15 0930  NA 138  K 3.6  CL 103  CO2 25  BUN 30*  CREATININE 0.77  CALCIUM 9.4  PROT 7.0  BILITOT 0.6  ALKPHOS 55  ALT 27  AST 22  GLUCOSE 205*   No results found for: CHOL, HDL, LDLCALC, TRIG No results found for: DDIMER  No results found for: BNP No results found for: PROBNP  Recent Labs  10/12/15 0930  INR 0.97      Radiology Studies    Dg Chest Portable 1 View  10/12/2015  CLINICAL DATA:  Chest pain, dyspnea for 2 weeks EXAM: PORTABLE CHEST 1 VIEW COMPARISON:  05/26/2013 FINDINGS: Stable  hyperinflation without focal pneumonia, collapse, or consolidation. Normal heart size and vascularity. No edema, effusion or pneumothorax. Trachea midline. Aortic atherosclerosis evident. Monitor leads overlie the chest. IMPRESSION: Stable hyperinflation and cardiomegaly. No interval change or acute process. Electronically Signed   By: Jerilynn Mages.  Shick M.D.   On: 10/12/2015 10:01    EKG & Cardiac Imaging    EKG: NSR, HR 85 with RBBB. No acute ST or T-wave changes.   ECHOCARDIOGRAM: 09/2012 Study Conclusions:   - Left ventricle: The cavity size was normal. Wall thickness was increased increased in a pattern of mild to moderate LVH. Systolic function was normal. The estimated ejection fraction was in the range of 50% to 55%. Rhythm noted to be atrial flutter during examination. Wall motion was normal; there were no regional wall motion abnormalities. The study is not technically sufficient to allow evaluation of LV diastolic function. - Aortic valve: Poorly visualized. Mildly to moderately calcified leaflets. Moderate thickening involving the noncoronary cusp. There was no stenosis. Trivial regurgitation. Mean gradient: 32mm Hg (S). VTI ratio of LVOT to aortic valve: 0.61. Valve area: 2.54cm^2(VTI). - Mitral valve: Calcified annulus. Mildly thickened leaflets . Mild regurgitation directed posteriorly. - Left atrium: The atrium was mildly to moderately dilated. - Right atrium: Central venous pressure: 23mm Hg (est). - Tricuspid valve: Mild regurgitation. - Pulmonary arteries: PA peak pressure: 66mm Hg (S). - Pericardium, extracardiac: There was no pericardial effusion.  Assessment & Plan   1. NSTEMI (non-ST elevated myocardial infarction) [facility]) - history of CAD s/p PCI to RCA in 2004 with 3 stents. Reports worsening dyspnea with exertion with mild chest discomfort over the past 2-3 weeks, most notable with climbing stairs and relieved with rest.  - EKG showed NSR,  HR 85 with RBBB and no acute ischemic changes. Initial troponin elevated to 0.09. Will cycle troponin values. - with his presenting symptoms, elevated troponin, and history of known CAD recommend a cardiac catheterization. The patient understands that risks include but are not limited to stroke (1 in 1000), death (1 in 46), kidney failure [usually temporary] (1 in 500), bleeding (1 in 200),  allergic reaction [possibly serious] (1 in 200). Will add to the cath schedule for tomorrow. NPO after midnight. - continue Heparin per pharmacy consult along with ASA, BB, and statin therapy.  2. History of Atrial flutter  - s/p ablation 2014 - maintaining NSR. Continue to monitor on telemetry.  3. HTN - BP has been 124/62 - 155/108 while admitted.  - continue home Coreg and Lisinopril. Hold Chlorthalidone in preparation of cath.  4. Type 2 DM - hold Metformin in preparation of cardiac cath. - SSI while admitted.  5. HLD - continue statin therapy.  Signed, Erma Heritage, PA-C 10/12/2015, 1:40 PM Pager: [phone]  I have seen and examined the patient along with Erma Heritage, PA-C.  I have reviewed the chart, notes and new data.  I agree with PA's note.  Key new complaints: classic pattern of recurrent and crescendo angina Key examination changes: normal CV exam Key new findings / data: slight elevation in troponin, but no high risk ECG changes.  PLAN: Recommend proceeding directly to cardiac cath and PCI if indicated. This procedure has been fully reviewed with the patient and written informed consent has been obtained.   Sanda Klein, MD, [place] [phone] 10/12/2015, 2:37 PM

## 2015-10-12 NOTE — Progress Notes (Signed)
ANTICOAGULATION CONSULT NOTE - Initial Consult  Pharmacy Consult for heparin  Indication: chest pain/ACS  No Known Allergies  Patient Measurements: Height: 5\' 11"  (180.3 cm) Weight: 242 lb 3.2 oz (109.861 kg) IBW/kg (Calculated) : 75.3 Heparin Dosing Weight: 99kg  Vital Signs: Temp: 98.1 F (36.7 C) (06/04 1610) Temp Source: Oral (06/04 1610) BP: 122/57 mmHg (06/04 1610) Pulse Rate: 72 (06/04 1610)  Labs:  Recent Labs  10/12/15 0930 10/12/15 1619  HGB 13.9  --   HCT 42.1  --   PLT 262  --   LABPROT 13.1  --   INR 0.97  --   HEPARINUNFRC  --  0.24*  CREATININE 0.77  --   TROPONINI 0.09*  --     Estimated Creatinine Clearance: 105.2 mL/min (by C-G formula based on Cr of 0.77).   Medical History: Past Medical History  Diagnosis Date  . CAD (coronary artery disease)     status post Taxus stent patency of RCA 2004, Cardiolite negative for ischemia in 2010.  Marland Kitchen Dyslipidemia   . Hypertension   . Right bundle branch block (RBBB) with left anterior hemiblock   . Typical atrial flutter ([place])     s/p ablation 11-23-2012 by Dr Rayann Heman  . Overweight   . Diabetes mellitus ([place])   . Snores   . History of kidney stones     Medications:  Prescriptions prior to admission  Medication Sig Dispense Refill Last Dose  . aspirin EC 81 MG tablet Take 1 tablet (81 mg total) by mouth daily. 90 tablet 3 10/12/2015 at Unknown time  . ASTAXANTHIN PO Take 10 mg by mouth daily.   10/12/2015 at Unknown time  . atorvastatin (LIPITOR) 40 MG tablet Take 1 tablet by mouth daily.   10/12/2015 at Unknown time  . Berberine Chloride POWD Take 1,200 mg by mouth daily.   10/12/2015 at Unknown time  . carvedilol (COREG) 6.25 MG tablet TAKE 1 TABLET TWICE DAILY 180 tablet 3 10/12/2015 at 0545  . chlorthalidone (HYGROTON) 25 MG tablet Take 25 mg by mouth daily.   10/12/2015 at Unknown time  . Coenzyme Q10 (CO Q10) 100 MG CAPS Take 1 tablet by mouth daily.   10/12/2015 at Unknown time  . fish oil-omega-3 fatty  acids 1000 MG capsule Take 2 g by mouth daily.   10/12/2015 at Unknown time  . KRILL OIL 1000 MG CAPS Take 1 capsule by mouth daily.    Past Week at Unknown time  . lisinopril (PRINIVIL,ZESTRIL) 20 MG tablet Take 1 tablet (20 mg total) by mouth daily. 90 tablet 3 10/12/2015 at Unknown time  . metFORMIN (GLUCOPHAGE) 500 MG tablet Take 1,000 mg by mouth 2 (two) times daily.   2 10/12/2015 at Unknown time  . Multiple Vitamin (MULITIVITAMIN WITH MINERALS) TABS Take 1 tablet by mouth daily.   10/12/2015 at Unknown time  . OVER THE COUNTER MEDICATION Take 1 capsule by mouth 3 (three) times daily. Glucose Essentials- Chromium, [place] kino tree extract, Gymnema, Tursaline Extract,Vanadyl Sulfate, Banaba Extract   10/12/2015 at Unknown time   Scheduled:  . [START ON 10/13/2015] aspirin EC  81 mg Oral Daily  . [START ON 10/13/2015] atorvastatin  40 mg Oral Daily  . carvedilol  6.25 mg Oral BID  . insulin aspart  0-15 Units Subcutaneous TID WC  . [START ON 10/13/2015] lisinopril  20 mg Oral Daily    Assessment: 75 yo male here with CP (history of CAD with stent in 2004). He is on heparin  at 1350 units/hr and the initial heparin level is below goal (HL= 0.24). Noted for  Cath on 6/5.   Goal of Therapy:  Heparin level 0.3-0.7 units/ml Monitor platelets by anticoagulation protocol: Yes   Plan:   -Increase heparin to 1550 units/hr -Heparin level in 6 hours and daily wth CBC daily  Hildred Laser, Pharm D 10/12/2015 5:07 PM

## 2015-10-13 ENCOUNTER — Other Ambulatory Visit: Payer: Self-pay | Admitting: *Deleted

## 2015-10-13 ENCOUNTER — Encounter (HOSPITAL_COMMUNITY)
Admission: EM | Disposition: A | Discharge status: Home Health | Payer: Self-pay | Source: Home / Self Care | Attending: Thoracic Surgery (Cardiothoracic Vascular Surgery)

## 2015-10-13 DIAGNOSIS — I2510 Atherosclerotic heart disease of native coronary artery without angina pectoris: Secondary | ICD-10-CM

## 2015-10-13 HISTORY — PX: CARDIAC CATHETERIZATION: SHX172

## 2015-10-13 LAB — CBC
HEMATOCRIT: 38 % — AB (ref 39.0–52.0)
HEMOGLOBIN: 12.6 g/dL — AB (ref 13.0–17.0)
MCH: 29.8 pg (ref 26.0–34.0)
MCHC: 33.2 g/dL (ref 30.0–36.0)
MCV: 89.8 fL (ref 78.0–100.0)
PLATELETS: 214 10*3/uL (ref 150–400)
RBC: 4.23 MIL/uL (ref 4.22–5.81)
RDW: 13.5 % (ref 11.5–15.5)
WBC: 9.5 10*3/uL (ref 4.0–10.5)

## 2015-10-13 LAB — GLUCOSE, CAPILLARY
GLUCOSE-CAPILLARY: 128 mg/dL — AB (ref 65–99)
GLUCOSE-CAPILLARY: 159 mg/dL — AB (ref 65–99)
Glucose-Capillary: 156 mg/dL — ABNORMAL HIGH (ref 65–99)
Glucose-Capillary: 129 mg/dL — ABNORMAL HIGH (ref 65–99)

## 2015-10-13 LAB — URINALYSIS, ROUTINE W REFLEX MICROSCOPIC
BILIRUBIN URINE: NEGATIVE
Glucose, UA: NEGATIVE mg/dL
HGB URINE DIPSTICK: NEGATIVE
Ketones, ur: NEGATIVE mg/dL
Leukocytes, UA: NEGATIVE
Nitrite: NEGATIVE
PROTEIN: NEGATIVE mg/dL
Specific Gravity, Urine: 1.034 — ABNORMAL HIGH (ref 1.005–1.030)
pH: 6 (ref 5.0–8.0)

## 2015-10-13 LAB — BASIC METABOLIC PANEL
ANION GAP: 8 (ref 5–15)
BUN: 21 mg/dL — AB (ref 6–20)
CALCIUM: 9 mg/dL (ref 8.9–10.3)
CO2: 26 mmol/L (ref 22–32)
Chloride: 104 mmol/L (ref 101–111)
Creatinine, Ser: 0.77 mg/dL (ref 0.61–1.24)
GFR calc Af Amer: >60 mL/min (ref >60)
Glucose, Bld: 138 mg/dL — ABNORMAL HIGH (ref 65–99)
Potassium: 3.5 mmol/L (ref 3.5–5.1)
SODIUM: 138 mmol/L (ref 135–145)

## 2015-10-13 LAB — PROTIME-INR
INR: 1.08 (ref 0.00–1.49)
PROTHROMBIN TIME: 14.2 s (ref 11.6–15.2)

## 2015-10-13 LAB — HEPARIN LEVEL (UNFRACTIONATED)
HEPARIN UNFRACTIONATED: 0.35 [IU]/mL (ref 0.30–0.70)
Heparin Unfractionated: 0.3 IU/mL (ref 0.30–0.70)

## 2015-10-13 LAB — TROPONIN I: TROPONIN I: 0.06 ng/mL — AB (ref <0.031)

## 2015-10-13 SURGERY — LEFT HEART CATH AND CORONARY ANGIOGRAPHY

## 2015-10-13 MED ORDER — HEPARIN (PORCINE) IN NACL 2-0.9 UNIT/ML-% IJ SOLN
INTRAMUSCULAR | Status: DC | PRN
  Administered 2015-10-13: 1500 mL

## 2015-10-13 MED ORDER — VERAPAMIL HCL 2.5 MG/ML IV SOLN
INTRAVENOUS | Status: AC
  Filled 2015-10-13: qty 2

## 2015-10-13 MED ORDER — VERAPAMIL HCL 2.5 MG/ML IV SOLN
INTRAVENOUS | Status: DC | PRN
  Administered 2015-10-13: 10 mL via INTRA_ARTERIAL

## 2015-10-13 MED ORDER — SODIUM CHLORIDE 0.9 % IV SOLN: 250.0000 mL | INTRAVENOUS | Status: DC | PRN

## 2015-10-13 MED ORDER — SODIUM CHLORIDE 0.9 % WEIGHT BASED INFUSION: 1.0000 mL/kg/h | INTRAVENOUS | Status: AC

## 2015-10-13 MED ORDER — MIDAZOLAM HCL 2 MG/2ML IJ SOLN
INTRAMUSCULAR | Status: DC | PRN
  Administered 2015-10-13: 1 mg via INTRAVENOUS

## 2015-10-13 MED ORDER — SODIUM CHLORIDE 0.9% FLUSH: 3.0000 mL | INTRAVENOUS | Status: DC | PRN

## 2015-10-13 MED ORDER — LIDOCAINE HCL (PF) 1 % IJ SOLN
INTRAMUSCULAR | Status: AC
  Filled 2015-10-13: qty 30

## 2015-10-13 MED ORDER — IOPAMIDOL (ISOVUE-370) INJECTION 76%
INTRAVENOUS | Status: DC | PRN
  Administered 2015-10-13: 70 mL via INTRA_ARTERIAL

## 2015-10-13 MED ORDER — NITROGLYCERIN 1 MG/10 ML FOR IR/CATH LAB
INTRA_ARTERIAL | Status: AC
  Filled 2015-10-13: qty 10

## 2015-10-13 MED ORDER — LIDOCAINE HCL (PF) 1 % IJ SOLN
INTRAMUSCULAR | Status: DC | PRN
  Administered 2015-10-13: 2 mL via SUBCUTANEOUS

## 2015-10-13 MED ORDER — HEPARIN (PORCINE) IN NACL 100-0.45 UNIT/ML-% IJ SOLN
1600.0000 [IU]/h | INTRAMUSCULAR | Status: DC
  Administered 2015-10-13 – 2015-10-14 (×2): 1600 [IU]/h via INTRAVENOUS
  Filled 2015-10-13 (×2): qty 250

## 2015-10-13 MED ORDER — FENTANYL CITRATE (PF) 100 MCG/2ML IJ SOLN
INTRAMUSCULAR | Status: DC | PRN
  Administered 2015-10-13: 25 ug via INTRAVENOUS

## 2015-10-13 MED ORDER — MIDAZOLAM HCL 2 MG/2ML IJ SOLN
INTRAMUSCULAR | Status: AC
  Filled 2015-10-13: qty 2

## 2015-10-13 MED ORDER — HEPARIN SODIUM (PORCINE) 1000 UNIT/ML IJ SOLN
INTRAMUSCULAR | Status: AC
  Filled 2015-10-13: qty 1

## 2015-10-13 MED ORDER — SODIUM CHLORIDE 0.9% FLUSH
3.0000 mL | Freq: Two times a day (BID) | INTRAVENOUS | Status: DC
  Administered 2015-10-14: 10 mL via INTRAVENOUS
  Administered 2015-10-14: 3 mL via INTRAVENOUS

## 2015-10-13 MED ORDER — HEPARIN (PORCINE) IN NACL 2-0.9 UNIT/ML-% IJ SOLN
INTRAMUSCULAR | Status: AC
  Filled 2015-10-13: qty 1000

## 2015-10-13 MED ORDER — FENTANYL CITRATE (PF) 100 MCG/2ML IJ SOLN
INTRAMUSCULAR | Status: AC
  Filled 2015-10-13: qty 2

## 2015-10-13 MED ORDER — HEPARIN SODIUM (PORCINE) 1000 UNIT/ML IJ SOLN
INTRAMUSCULAR | Status: DC | PRN
  Administered 2015-10-13: 5000 [IU] via INTRAVENOUS

## 2015-10-13 SURGICAL SUPPLY — 11 items
CATH INFINITI 5 FR JL3.5 (CATHETERS) ×2 IMPLANT
CATH INFINITI 5FR ANG PIGTAIL (CATHETERS) ×2 IMPLANT
CATH INFINITI JR4 5F (CATHETERS) ×2 IMPLANT
DEVICE RAD COMP TR BAND LRG (VASCULAR PRODUCTS) ×2 IMPLANT
GLIDESHEATH SLEND SS 6F .021 (SHEATH) ×2 IMPLANT
KIT HEART LEFT (KITS) ×3 IMPLANT
PACK CARDIAC CATHETERIZATION (CUSTOM PROCEDURE TRAY) ×3 IMPLANT
SYR MEDRAD MARK V 150ML (SYRINGE) ×3 IMPLANT
TRANSDUCER W/STOPCOCK (MISCELLANEOUS) ×3 IMPLANT
TUBING CIL FLEX 10 FLL-RA (TUBING) ×3 IMPLANT
WIRE SAFE-T 1.5MM-J .035X260CM (WIRE) ×2 IMPLANT

## 2015-10-13 NOTE — H&P (View-Only) (Signed)
Subjective:  No ecurrent chest pain  Objective:   Vital Signs : Filed Vitals:   10/12/15 2055 10/13/15 0017 10/13/15 0430 10/13/15 0746  BP: 114/62 96/68 122/78 147/71  Pulse: 78 72 74   Temp: 98.8 F (37.1 C) 98.7 F (37.1 C) 98.4 F (36.9 C) 97.9 F (36.6 C)  TempSrc: Oral Oral Oral Oral  Resp: _0 Height:      Weight:   237 lb 6.4 oz (107.684 kg)   SpO2: 95% 96% 100% 96%    Intake/Output from previous day:  Intake/Output Summary (Last 24 hours) at 10/13/15 1028 Last data filed at 10/13/15 0659  Gross per 24 hour  Intake 982.56 ml  Output      0 ml  Net 982.56 ml    I/O since admission: +982  Wt Readings from Last 3 Encounters:  10/13/15 237 lb 6.4 oz (107.684 kg)  07/31/15 243 lb (110.224 kg)  10/10/14 242 lb (109.77 kg)    Medications: . aspirin EC  81 mg Oral Daily  . atorvastatin  40 mg Oral Daily  . carvedilol  6.25 mg Oral BID  . insulin aspart  0-15 Units Subcutaneous TID WC  . lisinopril  20 mg Oral Daily    . sodium chloride 1 mL/kg/hr (10/13/15 0651)  . heparin 1,550 Units/hr (10/12/15 2308)    Physical Exam:   General appearance: alert, cooperative and no distress Neck: no adenopathy, no carotid bruit, no JVD, supple, symmetrical, trachea midline and thyroid not enlarged, symmetric, no tenderness/mass/nodules Lungs: clear to auscultation bilaterally Heart: regular rate and rhythm and 1/6 systolic murmur; no rub, thrills or heaves Abdomen: soft, non-tender; bowel sounds normal; no masses,  no organomegaly Extremities: no edema, redness or tenderness in the calves or thighs Pulses: 2+ and symmetric Skin: Skin color, texture, turgor normal. No rashes or lesions Neurologic: Grossly normal   Rate: 74  Rhythm: normal sinus rhythm  ECG (independently read by me): NSR at 85; LVH, RBBB  Lab Results:   Recent Labs  10/12/15 0930 10/13/15 0214  NA 138 138  K 3.6 3.5  CL 103 104  CO2 25 26  GLUCOSE 205* 138*  BUN 30* 21*    CREATININE 0.77 0.77  CALCIUM 9.4 9.0    Hepatic Function Latest Ref Rng 10/12/2015  Total Protein 6.5 - 8.1 g/dL 7.0  Albumin 3.5 - 5.0 g/dL 3.9  AST 15 - 41 U/L 22  ALT 17 - 63 U/L 27  Alk Phosphatase 38 - 126 U/L 55  Total Bilirubin 0.3 - 1.2 mg/dL 0.6     Recent Labs  10/12/15 0930 10/13/15 0214  WBC 9.2 9.5  HGB 13.9 12.6*  HCT 42.1 38.0*  MCV 91.7 89.8  PLT 262 214     Recent Labs  10/12/15 1619 10/12/15 2020 10/13/15 0214  TROPONINI 0.08* 0.08* 0.06*    No results found for: TSH No results for input(s): HGBA1C in the last 72 hours.   Recent Labs  10/12/15 0930  PROT 7.0  ALBUMIN 3.9  AST 22  ALT 27  ALKPHOS 55  BILITOT 0.6    Recent Labs  10/13/15 0214  INR 1.08   BNP (last 3 results) No results for input(s): BNP in the last 8760 hours.  ProBNP (last 3 results) No results for input(s): PROBNP in the last 8760 hours.   Lipid Panel  No results found for: CHOL, TRIG, HDL, CHOLHDL, VLDL, LDLCALC, LDLDIRECT    Imaging:  Dg Chest  Portable 1 View  10/12/2015  CLINICAL DATA:  Chest pain, dyspnea for 2 weeks EXAM: PORTABLE CHEST 1 VIEW COMPARISON:  05/26/2013 FINDINGS: Stable hyperinflation without focal pneumonia, collapse, or consolidation. Normal heart size and vascularity. No edema, effusion or pneumothorax. Trachea midline. Aortic atherosclerosis evident. Monitor leads overlie the chest. IMPRESSION: Stable hyperinflation and cardiomegaly. No interval change or acute process. Electronically Signed   By: Jerilynn Mages.  Shick M.D.   On: 10/12/2015 10:01      Assessment/Plan:   Active Problems:   NSTEMI (non-ST elevated myocardial infarction) ([place])   Unstable angina pectoris ([place])    1. NSTEMI (non-ST elevated myocardial infarction) [facility]) - history of CAD s/p PCI to RCA in 2004 with 3 stents. Reports worsening dyspnea with exertion with mild chest discomfort over the past 2-3 weeks, most notable with climbing stairs and relieved with rest.  - EKG  showed NSR, HR 85 with RBBB and no acute ischemic changes.  - Troponins are + at 0.08, 0.08, and 0.06 in a plateau distribution   - with his presenting symptoms, elevated troponin, and history of known CAD recommend a cardiac catheterization. I discussed the cath and possible PCI procedure.  The patient understands that risks include but are not limited to stroke (1 in 1000), death (1 in 68), kidney failure [usually temporary] (1 in 500), bleeding (1 in 200), allergic reaction [possibly serious] (1 in 200). For cath this early afternoon. - continue Heparin per pharmacy consult along with ASA, BB, and statin therapy.  2. History of Atrial flutter  - s/p ablation 2014 - maintaining NSR. Continue to monitor on telemetry.  3. HTN - BP has been 124/62 - 155/108 while admitted.  - continue home Coreg and Lisinopril. Hold Chlorthalidone in preparation of cath.  4. Type 2 DM - hold Metformin in preparation of cardiac cath. - SSI while admitted.  5. HLD - continue statin therapy.   [place] Sine, MD, [facility] 10/13/2015, 10:28 AM

## 2015-10-13 NOTE — Progress Notes (Signed)
ANTICOAGULATION CONSULT NOTE - Follow Up Consult  Pharmacy Consult for heparin Indication: NSTEMI  Labs:  Recent Labs  10/12/15 0930 10/12/15 1619 10/12/15 1756 10/12/15 2020 10/13/15 0214  HGB 13.9  --   --   --  12.6*  HCT 42.1  --   --   --  38.0*  PLT 262  --   --   --  214  LABPROT 13.1  --   --   --  14.2  INR 0.97  --   --   --  1.08  HEPARINUNFRC  --  0.24* 0.22*  --  0.35  CREATININE 0.77  --   --   --   --   TROPONINI 0.09* 0.08*  --  0.08*  --     Assessment/Plan:  74yo male therapeutic on heparin after rate changes. Will continue gtt at current rate and confirm stable with additional level.   Wynona Neat, PharmD, BCPS  10/13/2015,2:56 AM

## 2015-10-13 NOTE — Interval H&P Note (Signed)
History and Physical Interval Note:  10/13/2015 2:38 PM  [name]  has presented today for surgery, with the diagnosis of NSTEMI  The various methods of treatment have been discussed with the patient and family. After consideration of risks, benefits and other options for treatment, the patient has consented to  Procedure(s): Left Heart Cath and Coronary Angiography (N/A) as a surgical intervention .  The patient's history has been reviewed, patient examined, no change in status, stable for surgery.  I have reviewed the patient's chart and labs.  Questions were answered to the patient's satisfaction.   Cath Lab Visit (complete for each Cath Lab visit)  Clinical Evaluation Leading to the Procedure:   ACS: Yes.    Non-ACS:    Anginal Classification: CCS IV  Anti-ischemic medical therapy: Maximal Therapy (2 or more classes of medications)  Non-Invasive Test Results: No non-invasive testing performed  Prior CABG: No previous CABG        Nikyla Navedo [place] MD,FACC 10/13/2015 2:38 PM

## 2015-10-13 NOTE — Progress Notes (Signed)
Subjective:  No ecurrent chest pain  Objective:   Vital Signs : Filed Vitals:   10/12/15 2055 10/13/15 0017 10/13/15 0430 10/13/15 0746  BP: 114/62 96/68 122/78 147/71  Pulse: 78 72 74   Temp: 98.8 F (37.1 C) 98.7 F (37.1 C) 98.4 F (36.9 C) 97.9 F (36.6 C)  TempSrc: Oral Oral Oral Oral  Resp: _0 Height:      Weight:   237 lb 6.4 oz (107.684 kg)   SpO2: 95% 96% 100% 96%    Intake/Output from previous day:  Intake/Output Summary (Last 24 hours) at 10/13/15 1028 Last data filed at 10/13/15 0659  Gross per 24 hour  Intake 982.56 ml  Output      0 ml  Net 982.56 ml    I/O since admission: +982  Wt Readings from Last 3 Encounters:  10/13/15 237 lb 6.4 oz (107.684 kg)  07/31/15 243 lb (110.224 kg)  10/10/14 242 lb (109.77 kg)    Medications: . aspirin EC  81 mg Oral Daily  . atorvastatin  40 mg Oral Daily  . carvedilol  6.25 mg Oral BID  . insulin aspart  0-15 Units Subcutaneous TID WC  . lisinopril  20 mg Oral Daily    . sodium chloride 1 mL/kg/hr (10/13/15 0651)  . heparin 1,550 Units/hr (10/12/15 2308)    Physical Exam:   General appearance: alert, cooperative and no distress Neck: no adenopathy, no carotid bruit, no JVD, supple, symmetrical, trachea midline and thyroid not enlarged, symmetric, no tenderness/mass/nodules Lungs: clear to auscultation bilaterally Heart: regular rate and rhythm and 1/6 systolic murmur; no rub, thrills or heaves Abdomen: soft, non-tender; bowel sounds normal; no masses,  no organomegaly Extremities: no edema, redness or tenderness in the calves or thighs Pulses: 2+ and symmetric Skin: Skin color, texture, turgor normal. No rashes or lesions Neurologic: Grossly normal   Rate: 74  Rhythm: normal sinus rhythm  ECG (independently read by me): NSR at 85; LVH, RBBB  Lab Results:   Recent Labs  10/12/15 0930 10/13/15 0214  NA 138 138  K 3.6 3.5  CL 103 104  CO2 25 26  GLUCOSE 205* 138*  BUN 30* 21*    CREATININE 0.77 0.77  CALCIUM 9.4 9.0    Hepatic Function Latest Ref Rng 10/12/2015  Total Protein 6.5 - 8.1 g/dL 7.0  Albumin 3.5 - 5.0 g/dL 3.9  AST 15 - 41 U/L 22  ALT 17 - 63 U/L 27  Alk Phosphatase 38 - 126 U/L 55  Total Bilirubin 0.3 - 1.2 mg/dL 0.6     Recent Labs  10/12/15 0930 10/13/15 0214  WBC 9.2 9.5  HGB 13.9 12.6*  HCT 42.1 38.0*  MCV 91.7 89.8  PLT 262 214     Recent Labs  10/12/15 1619 10/12/15 2020 10/13/15 0214  TROPONINI 0.08* 0.08* 0.06*    No results found for: TSH No results for input(s): HGBA1C in the last 72 hours.   Recent Labs  10/12/15 0930  PROT 7.0  ALBUMIN 3.9  AST 22  ALT 27  ALKPHOS 55  BILITOT 0.6    Recent Labs  10/13/15 0214  INR 1.08   BNP (last 3 results) No results for input(s): BNP in the last 8760 hours.  ProBNP (last 3 results) No results for input(s): PROBNP in the last 8760 hours.   Lipid Panel  No results found for: CHOL, TRIG, HDL, CHOLHDL, VLDL, LDLCALC, LDLDIRECT    Imaging:  Dg Chest  Portable 1 View  10/12/2015  CLINICAL DATA:  Chest pain, dyspnea for 2 weeks EXAM: PORTABLE CHEST 1 VIEW COMPARISON:  05/26/2013 FINDINGS: Stable hyperinflation without focal pneumonia, collapse, or consolidation. Normal heart size and vascularity. No edema, effusion or pneumothorax. Trachea midline. Aortic atherosclerosis evident. Monitor leads overlie the chest. IMPRESSION: Stable hyperinflation and cardiomegaly. No interval change or acute process. Electronically Signed   By: Jerilynn Mages.  Shick M.D.   On: 10/12/2015 10:01      Assessment/Plan:   Active Problems:   NSTEMI (non-ST elevated myocardial infarction) ([place])   Unstable angina pectoris ([place])    1. NSTEMI (non-ST elevated myocardial infarction) [facility]) - history of CAD s/p PCI to RCA in 2004 with 3 stents. Reports worsening dyspnea with exertion with mild chest discomfort over the past 2-3 weeks, most notable with climbing stairs and relieved with rest.  - EKG  showed NSR, HR 85 with RBBB and no acute ischemic changes.  - Troponins are + at 0.08, 0.08, and 0.06 in a plateau distribution   - with his presenting symptoms, elevated troponin, and history of known CAD recommend a cardiac catheterization. I discussed the cath and possible PCI procedure.  The patient understands that risks include but are not limited to stroke (1 in 1000), death (1 in 68), kidney failure [usually temporary] (1 in 500), bleeding (1 in 200), allergic reaction [possibly serious] (1 in 200). For cath this early afternoon. - continue Heparin per pharmacy consult along with ASA, BB, and statin therapy.  2. History of Atrial flutter  - s/p ablation 2014 - maintaining NSR. Continue to monitor on telemetry.  3. HTN - BP has been 124/62 - 155/108 while admitted.  - continue home Coreg and Lisinopril. Hold Chlorthalidone in preparation of cath.  4. Type 2 DM - hold Metformin in preparation of cardiac cath. - SSI while admitted.  5. HLD - continue statin therapy.   [place] Sine, MD, [facility] 10/13/2015, 10:28 AM

## 2015-10-13 NOTE — Progress Notes (Addendum)
[facility] for heparin  Indication: chest pain/ACS  No Known Allergies  Patient Measurements: Height: 5\' 11"  (180.3 cm) Weight: 237 lb 6.4 oz (107.684 kg) IBW/kg (Calculated) : 75.3 Heparin Dosing Weight: 99kg  Vital Signs: Temp: 97.9 F (36.6 C) (06/05 0746) Temp Source: Oral (06/05 0746) BP: 147/71 mmHg (06/05 0746) Pulse Rate: 74 (06/05 0430)  Labs:  Recent Labs  10/12/15 0930  10/12/15 1619 10/12/15 1756 10/12/15 2020 10/13/15 0214 10/13/15 0949  HGB 13.9  --   --   --   --  12.6*  --   HCT 42.1  --   --   --   --  38.0*  --   PLT 262  --   --   --   --  214  --   LABPROT 13.1  --   --   --   --  14.2  --   INR 0.97  --   --   --   --  1.08  --   HEPARINUNFRC  --   < > 0.24* 0.22*  --  0.35 0.30  CREATININE 0.77  --   --   --   --  0.77  --   TROPONINI 0.09*  --  0.08*  --  0.08* 0.06*  --   < > = values in this interval not displayed.  Estimated Creatinine Clearance: 104.2 mL/min (by C-G formula based on Cr of 0.77).   Medical History: Past Medical History  Diagnosis Date  . CAD (coronary artery disease)     status post Taxus stent patency of RCA 2004, Cardiolite negative for ischemia in 2010.  Marland Kitchen Dyslipidemia   . Hypertension   . Right bundle branch block (RBBB) with left anterior hemiblock   . Typical atrial flutter ([place])     s/p ablation 11-23-2012 by Dr Rayann Heman  . Overweight   . Diabetes mellitus ([place])   . Snores   . History of kidney stones     Medications:  Prescriptions prior to admission  Medication Sig Dispense Refill Last Dose  . aspirin EC 81 MG tablet Take 1 tablet (81 mg total) by mouth daily. 90 tablet 3 10/12/2015 at Unknown time  . ASTAXANTHIN PO Take 10 mg by mouth daily.   10/12/2015 at Unknown time  . atorvastatin (LIPITOR) 40 MG tablet Take 1 tablet by mouth daily.   10/12/2015 at Unknown time  . [place] Chloride POWD Take 1,200 mg by mouth daily.   10/12/2015 at Unknown time  . carvedilol (COREG)  6.25 MG tablet TAKE 1 TABLET TWICE DAILY 180 tablet 3 10/12/2015 at 0545  . chlorthalidone (HYGROTON) 25 MG tablet Take 25 mg by mouth daily.   10/12/2015 at Unknown time  . Coenzyme Q10 (CO Q10) 100 MG CAPS Take 1 tablet by mouth daily.   10/12/2015 at Unknown time  . fish oil-omega-3 fatty acids 1000 MG capsule Take 2 g by mouth daily.   10/12/2015 at Unknown time  . KRILL OIL 1000 MG CAPS Take 1 capsule by mouth daily.    Past Week at Unknown time  . lisinopril (PRINIVIL,ZESTRIL) 20 MG tablet Take 1 tablet (20 mg total) by mouth daily. 90 tablet 3 10/12/2015 at Unknown time  . metFORMIN (GLUCOPHAGE) 500 MG tablet Take 1,000 mg by mouth 2 (two) times daily.   2 10/12/2015 at Unknown time  . Multiple Vitamin (MULITIVITAMIN WITH MINERALS) TABS Take 1 tablet by mouth daily.   10/12/2015 at Unknown time  .  OVER THE COUNTER MEDICATION Take 1 capsule by mouth 3 (three) times daily. Glucose Essentials- Chromium, [place] kino tree extract, Gymnema, Tursaline Extract,Vanadyl Sulfate, Banaba Extract   10/12/2015 at Unknown time   Scheduled:  . aspirin EC  81 mg Oral Daily  . atorvastatin  40 mg Oral Daily  . carvedilol  6.25 mg Oral BID  . insulin aspart  0-15 Units Subcutaneous TID WC  . lisinopril  20 mg Oral Daily    Assessment: 75 yo male here with CP (history of CAD with stent in 2004). He is on heparin at 1550 units/hr and heparin level this morning is just therapeutic at 0.3.Will increase a little to try to keep min range. Noted for Cath today.   Goal of Therapy:  Heparin level 0.3-0.7 units/ml Monitor platelets by anticoagulation protocol: Yes   Plan:   Increase heparin to 1650 units/hr Check anti-Xa level in 6 hours and daily while on heparin Continue to monitor H&H and platelets F/u post cath   Thank you for allowing [place] to participate in this patients care. Jens Som, PharmD Pager: [phone] 10/13/2015 11:23 AM    Addendum: Patient is now s/p cath found to have left main and severe 3  vessel CAD. CVTS consulted for CABG. Heparin to resume 8 hours post-sheath removal. Radial sheath removed at 1510 and TR band placed.  Plan: At ~2300 tonight, resume heparin at 1600 units/hr with no bolus Check 8 hour heparin level  Follow up plans for CABG  Nena Jordan, PharmD, BCPS 10/13/2015, 3:33 PM

## 2015-10-13 NOTE — Plan of Care (Signed)
Problem: Consults Goal: Cardiac Cath Patient Education (See Patient Education module for education specifics.) Outcome: Progressing Patient admitted with chest pain with positive troponin I values that have remained flat overnight. Has remained chest pain free this shift with stable vital signs: Filed Vitals:    10/12/15 1307 10/12/15 1610 10/12/15 2055 10/13/15 0017  BP: 155/67 122/57 114/62 96/68  Pulse: 68 72 78 72  Temp: 97.7 F (36.5 C) 98.1 F (36.7 C) 98.8 F (37.1 C) 98.7 F (37.1 C)  TempSrc: Oral Oral Oral Oral  Resp: 16 16 16 16   Height: 5\' 11"  (1.803 m)        Weight: 109.861 kg (242 lb 3.2 oz)        SpO2: 97% 97% 95% 96%    Receiving IV heparin at 1550 units/hr and awaiting cardiac catheterization today. Patient had no questions or concerns at time of last assessment; ready for procedure.  Will need post-cath instruction once procedure is complete.  Continuing to monitor.

## 2015-10-14 ENCOUNTER — Inpatient Hospital Stay (HOSPITAL_COMMUNITY): Payer: Medicare Other

## 2015-10-14 ENCOUNTER — Encounter (HOSPITAL_COMMUNITY): Payer: Self-pay | Admitting: Cardiology

## 2015-10-14 DIAGNOSIS — I214 Non-ST elevation (NSTEMI) myocardial infarction: Principal | ICD-10-CM

## 2015-10-14 DIAGNOSIS — I25110 Atherosclerotic heart disease of native coronary artery with unstable angina pectoris: Secondary | ICD-10-CM

## 2015-10-14 DIAGNOSIS — R011 Cardiac murmur, unspecified: Secondary | ICD-10-CM

## 2015-10-14 DIAGNOSIS — I2510 Atherosclerotic heart disease of native coronary artery without angina pectoris: Secondary | ICD-10-CM

## 2015-10-14 DIAGNOSIS — I359 Nonrheumatic aortic valve disorder, unspecified: Secondary | ICD-10-CM

## 2015-10-14 DIAGNOSIS — I350 Nonrheumatic aortic (valve) stenosis: Secondary | ICD-10-CM

## 2015-10-14 DIAGNOSIS — I340 Nonrheumatic mitral (valve) insufficiency: Secondary | ICD-10-CM

## 2015-10-14 DIAGNOSIS — I200 Unstable angina: Secondary | ICD-10-CM

## 2015-10-14 HISTORY — DX: Nonrheumatic aortic (valve) stenosis: I35.0

## 2015-10-14 LAB — ECHOCARDIOGRAM COMPLETE
AO mean calculated velocity dopler: 167 cm/s
AOPV: 0.38 m/s
AV Mean grad: 14 mmHg
AVCELMEANRAT: 0.45
AVLVOTPG: 4 mmHg
AVPG: 28 mmHg
AVPKVEL: 266 cm/s
CHL CUP DOP CALC LVOT VTI: 21.8 cm
CHL CUP MV DEC (S): 236
E decel time: 236 msec
E/e' ratio: 14.78
FS: 28 % (ref 28–44)
HEIGHTINCHES: 71 in
IVS/LV PW RATIO, ED: 1.15
LA diam end sys: 44 mm
LA vol A4C: 59 ml
LADIAMINDEX: 1.95 cm/m2
LASIZE: 44 mm
LV E/e' medial: 14.78
LV E/e'average: 14.78
LV PW d: 11.7 mm — AB (ref 0.6–1.1)
LV TDI E'MEDIAL: 5.7
LV dias vol: 123 mL (ref 62–150)
LVDIAVOLIN: 54 mL/m2
LVELAT: 7.24 cm/s
LVOT peak vel: 102 cm/s
LVOTVTI: 0.46 cm
Lateral S' vel: 11.8 cm/s
MV Peak grad: 5 mmHg
MV pk A vel: 112 m/s
MVPKEVEL: 107 m/s
TDI e' lateral: 7.24
VTI: 47.7 cm
WEIGHTICAEL: 3785.6 [oz_av]

## 2015-10-14 LAB — CBC
HEMATOCRIT: 39.5 % (ref 39.0–52.0)
Hemoglobin: 13.2 g/dL (ref 13.0–17.0)
MCH: 29.7 pg (ref 26.0–34.0)
MCHC: 33.4 g/dL (ref 30.0–36.0)
MCV: 89 fL (ref 78.0–100.0)
PLATELETS: 229 10*3/uL (ref 150–400)
RBC: 4.44 MIL/uL (ref 4.22–5.81)
RDW: 13.4 % (ref 11.5–15.5)
WBC: 9 10*3/uL (ref 4.0–10.5)

## 2015-10-14 LAB — PULMONARY FUNCTION TEST
DL/VA % PRED: 95 %
DL/VA: 4.46 ml/min/mmHg/L
DLCO COR: 21.96 ml/min/mmHg
DLCO UNC % PRED: 62 %
DLCO UNC: 21.05 ml/min/mmHg
DLCO cor % pred: 65 %
FEF 25-75 PRE: 2.01 L/s
FEF 25-75 Post: 1.12 L/sec
FEF2575-%Change-Post: -44 %
FEF2575-%PRED-PRE: 83 %
FEF2575-%Pred-Post: 46 %
FEV1-%Change-Post: -10 %
FEV1-%PRED-POST: 66 %
FEV1-%PRED-PRE: 73 %
FEV1-POST: 2.16 L
FEV1-PRE: 2.41 L
FEV1FVC-%CHANGE-POST: -1 %
FEV1FVC-%Pred-Pre: 105 %
FEV6-%CHANGE-POST: -9 %
FEV6-%PRED-POST: 66 %
FEV6-%PRED-PRE: 73 %
FEV6-PRE: 3.12 L
FEV6-Post: 2.81 L
FEV6FVC-%CHANGE-POST: 0 %
FEV6FVC-%PRED-POST: 105 %
FEV6FVC-%PRED-PRE: 106 %
FVC-%Change-Post: -9 %
FVC-%Pred-Post: 63 %
FVC-%Pred-Pre: 69 %
FVC-Post: 2.83 L
FVC-Pre: 3.12 L
POST FEV6/FVC RATIO: 99 %
PRE FEV1/FVC RATIO: 77 %
Post FEV1/FVC ratio: 76 %
Pre FEV6/FVC Ratio: 100 %
RV % PRED: 86 %
RV: 2.22 L
TLC % PRED: 74 %
TLC: 5.36 L

## 2015-10-14 LAB — BASIC METABOLIC PANEL
ANION GAP: 10 (ref 5–15)
BUN: 16 mg/dL (ref 6–20)
CALCIUM: 9.6 mg/dL (ref 8.9–10.3)
CO2: 25 mmol/L (ref 22–32)
Chloride: 102 mmol/L (ref 101–111)
Creatinine, Ser: 0.84 mg/dL (ref 0.61–1.24)
GFR calc Af Amer: >60 mL/min (ref >60)
GFR calc non Af Amer: >60 mL/min (ref >60)
GLUCOSE: 173 mg/dL — AB (ref 65–99)
Potassium: 3.8 mmol/L (ref 3.5–5.1)
Sodium: 137 mmol/L (ref 135–145)

## 2015-10-14 LAB — COMPREHENSIVE METABOLIC PANEL
ALBUMIN: 3.3 g/dL — AB (ref 3.5–5.0)
ALK PHOS: 53 U/L (ref 38–126)
ALT: 26 U/L (ref 17–63)
ANION GAP: 12 (ref 5–15)
AST: 19 U/L (ref 15–41)
BILIRUBIN TOTAL: 0.5 mg/dL (ref 0.3–1.2)
BUN: 12 mg/dL (ref 6–20)
CALCIUM: 9.6 mg/dL (ref 8.9–10.3)
CO2: 26 mmol/L (ref 22–32)
Chloride: 103 mmol/L (ref 101–111)
Creatinine, Ser: 0.71 mg/dL (ref 0.61–1.24)
GLUCOSE: 143 mg/dL — AB (ref 65–99)
Potassium: 3.6 mmol/L (ref 3.5–5.1)
SODIUM: 141 mmol/L (ref 135–145)
TOTAL PROTEIN: 6.1 g/dL — AB (ref 6.5–8.1)

## 2015-10-14 LAB — GLUCOSE, CAPILLARY
GLUCOSE-CAPILLARY: 170 mg/dL — AB (ref 65–99)
GLUCOSE-CAPILLARY: 134 mg/dL — AB (ref 65–99)
Glucose-Capillary: 181 mg/dL — ABNORMAL HIGH (ref 65–99)
Glucose-Capillary: 136 mg/dL — ABNORMAL HIGH (ref 65–99)

## 2015-10-14 LAB — LIPID PANEL
CHOL/HDL RATIO: 3.2 ratio
CHOLESTEROL: 108 mg/dL (ref 0–200)
HDL: 34 mg/dL — AB (ref >40)
LDL Cholesterol: 43 mg/dL (ref 0–99)
Triglycerides: 157 mg/dL — ABNORMAL HIGH (ref <150)
VLDL: 31 mg/dL (ref 0–40)

## 2015-10-14 LAB — BLOOD GAS, ARTERIAL
Acid-Base Excess: 0 mmol/L (ref 0.0–2.0)
Bicarbonate: 23.4 mEq/L (ref 20.0–24.0)
DRAWN BY: 398661
FIO2: 0.21
O2 Saturation: 95.7 %
PCO2 ART: 33.5 mmHg — AB (ref 35.0–45.0)
PH ART: 7.46 — AB (ref 7.350–7.450)
PO2 ART: 81.6 mmHg (ref 80.0–100.0)
Patient temperature: 98.6
TCO2: 24.5 mmol/L (ref 0–100)

## 2015-10-14 LAB — PROTIME-INR
INR: 1.16 (ref 0.00–1.49)
Prothrombin Time: 15 seconds (ref 11.6–15.2)

## 2015-10-14 LAB — PREALBUMIN: PREALBUMIN: 28.1 mg/dL (ref 18–38)

## 2015-10-14 LAB — HEPARIN LEVEL (UNFRACTIONATED): HEPARIN UNFRACTIONATED: 0.33 [IU]/mL (ref 0.30–0.70)

## 2015-10-14 LAB — SURGICAL PCR SCREEN
MRSA, PCR: NEGATIVE
STAPHYLOCOCCUS AUREUS: POSITIVE — AB

## 2015-10-14 LAB — ABO/RH: ABO/RH(D): A POS

## 2015-10-14 LAB — MAGNESIUM: Magnesium: 1.8 mg/dL (ref 1.7–2.4)

## 2015-10-14 LAB — APTT: aPTT: 58 seconds — ABNORMAL HIGH (ref 24–37)

## 2015-10-14 MED ORDER — CHLORHEXIDINE GLUCONATE 4 % EX LIQD
60.0000 mL | Freq: Once | CUTANEOUS | Status: AC
  Administered 2015-10-14: 4 via TOPICAL
  Filled 2015-10-14: qty 60

## 2015-10-14 MED ORDER — TEMAZEPAM 15 MG PO CAPS
15.0000 mg | ORAL_CAPSULE | Freq: Once | ORAL | Status: AC | PRN
  Administered 2015-10-14: 15 mg via ORAL
  Filled 2015-10-14: qty 1

## 2015-10-14 MED ORDER — MUPIROCIN 2 % EX OINT
1.0000 "application " | TOPICAL_OINTMENT | Freq: Two times a day (BID) | CUTANEOUS | Status: DC
  Administered 2015-10-14: 1 via NASAL
  Filled 2015-10-14: qty 22

## 2015-10-14 MED ORDER — CHLORHEXIDINE GLUCONATE CLOTH 2 % EX PADS: 6.0000 | MEDICATED_PAD | Freq: Every day | CUTANEOUS | Status: DC

## 2015-10-14 MED ORDER — CEFUROXIME SODIUM 750 MG IJ SOLR
750.0000 mg | INTRAMUSCULAR | Status: DC
  Filled 2015-10-14: qty 750

## 2015-10-14 MED ORDER — CHLORHEXIDINE GLUCONATE 0.12 % MT SOLN
15.0000 mL | Freq: Once | OROMUCOSAL | Status: AC
  Administered 2015-10-15: 15 mL via OROMUCOSAL
  Filled 2015-10-14: qty 15

## 2015-10-14 MED ORDER — PHENYLEPHRINE HCL 10 MG/ML IJ SOLN
30.0000 ug/min | INTRAVENOUS | Status: AC
  Administered 2015-10-15: 80 ug/min via INTRAVENOUS
  Filled 2015-10-14: qty 2

## 2015-10-14 MED ORDER — PLASMA-LYTE 148 IV SOLN
INTRAVENOUS | Status: AC
  Administered 2015-10-15: 500 mL
  Filled 2015-10-14: qty 2.5

## 2015-10-14 MED ORDER — CEFUROXIME SODIUM 1.5 G IJ SOLR
1.5000 g | INTRAMUSCULAR | Status: AC
  Administered 2015-10-15: 09:00:00 via INTRAVENOUS
  Administered 2015-10-15: 1.5 g via INTRAVENOUS
  Administered 2015-10-15: .75 g via INTRAVENOUS
  Filled 2015-10-14: qty 1.5

## 2015-10-14 MED ORDER — CHLORHEXIDINE GLUCONATE 4 % EX LIQD
60.0000 mL | Freq: Once | CUTANEOUS | Status: AC
  Administered 2015-10-15: 4 via TOPICAL
  Filled 2015-10-14: qty 60

## 2015-10-14 MED ORDER — SODIUM CHLORIDE 0.9 % IV SOLN
INTRAVENOUS | Status: DC
  Filled 2015-10-14: qty 30

## 2015-10-14 MED ORDER — ALBUTEROL SULFATE (2.5 MG/3ML) 0.083% IN NEBU
2.5000 mg | INHALATION_SOLUTION | Freq: Once | RESPIRATORY_TRACT | Status: AC
  Administered 2015-10-14: 2.5 mg via RESPIRATORY_TRACT

## 2015-10-14 MED ORDER — EPINEPHRINE HCL 1 MG/ML IJ SOLN
0.0000 ug/min | INTRAMUSCULAR | Status: DC
  Filled 2015-10-14: qty 4

## 2015-10-14 MED ORDER — NITROGLYCERIN IN D5W 200-5 MCG/ML-% IV SOLN
2.0000 ug/min | INTRAVENOUS | Status: DC
  Filled 2015-10-14 (×2): qty 250

## 2015-10-14 MED ORDER — VANCOMYCIN HCL 1000 MG IV SOLR
INTRAVENOUS | Status: AC
  Administered 2015-10-15: 1000 mL
  Filled 2015-10-14: qty 1000

## 2015-10-14 MED ORDER — SODIUM CHLORIDE 0.9 % IV SOLN
INTRAVENOUS | Status: AC
  Administered 2015-10-15: 70 mL/h via INTRAVENOUS
  Filled 2015-10-14: qty 40

## 2015-10-14 MED ORDER — METOPROLOL TARTRATE 12.5 MG HALF TABLET
12.5000 mg | ORAL_TABLET | Freq: Once | ORAL | Status: AC
  Administered 2015-10-15: 12.5 mg via ORAL
  Filled 2015-10-14: qty 1

## 2015-10-14 MED ORDER — BISACODYL 5 MG PO TBEC: 5.0000 mg | DELAYED_RELEASE_TABLET | Freq: Once | ORAL | Status: DC

## 2015-10-14 MED ORDER — DEXMEDETOMIDINE HCL IN NACL 400 MCG/100ML IV SOLN
0.1000 ug/kg/h | INTRAVENOUS | Status: AC
  Administered 2015-10-15: .3 ug/kg/h via INTRAVENOUS
  Filled 2015-10-14: qty 100

## 2015-10-14 MED ORDER — VANCOMYCIN HCL 10 G IV SOLR
1500.0000 mg | INTRAVENOUS | Status: AC
  Administered 2015-10-15: 1500 mg via INTRAVENOUS
  Filled 2015-10-14: qty 1500

## 2015-10-14 MED ORDER — MAGNESIUM SULFATE 50 % IJ SOLN
40.0000 meq | INTRAMUSCULAR | Status: DC
  Filled 2015-10-14: qty 10

## 2015-10-14 MED ORDER — DOPAMINE-DEXTROSE 3.2-5 MG/ML-% IV SOLN
0.0000 ug/kg/min | INTRAVENOUS | Status: DC
  Filled 2015-10-14: qty 250

## 2015-10-14 MED ORDER — POTASSIUM CHLORIDE 2 MEQ/ML IV SOLN
80.0000 meq | INTRAVENOUS | Status: DC
  Filled 2015-10-14: qty 40

## 2015-10-14 MED ORDER — SODIUM CHLORIDE 0.9 % IV SOLN
INTRAVENOUS | Status: AC
  Administered 2015-10-15: 1.8 [IU]/h via INTRAVENOUS
  Filled 2015-10-14: qty 2.5

## 2015-10-14 MED ORDER — PERFLUTREN LIPID MICROSPHERE
1.0000 mL | INTRAVENOUS | Status: DC | PRN
  Administered 2015-10-14: 2 mL via INTRAVENOUS
  Filled 2015-10-14: qty 10

## 2015-10-14 MED ORDER — PERFLUTREN LIPID MICROSPHERE
INTRAVENOUS | Status: AC
  Administered 2015-10-14: 2 mL via INTRAVENOUS
  Filled 2015-10-14: qty 10

## 2015-10-14 NOTE — Anesthesia Preprocedure Evaluation (Addendum)
Anesthesia Evaluation  Patient identified by MRN, date of birth, ID band Patient awake    Reviewed: Allergy & Precautions, NPO status , Patient's Chart, lab work & pertinent test results, reviewed documented beta blocker date and time   Airway Mallampati: IV  TM Distance: >3 FB Neck ROM: Full    Dental  (+) Teeth Intact, Dental Advisory Given   Pulmonary former smoker,    breath sounds clear to auscultation       Cardiovascular hypertension, Pt. on medications and Pt. on home beta blockers + angina + CAD and + Past MI  + dysrhythmias Atrial Fibrillation + Valvular Problems/Murmurs AS  Rhythm:Regular Rate:Normal + Systolic murmurs    Neuro/Psych negative neurological ROS  negative psych ROS   GI/Hepatic negative GI ROS, Neg liver ROS,   Endo/Other  diabetes, Type 2, Oral Hypoglycemic Agents  Renal/GU negative Renal ROS     Musculoskeletal negative musculoskeletal ROS (+)   Abdominal   Peds  Hematology negative hematology ROS (+)   Anesthesia Other Findings   Reproductive/Obstetrics negative OB ROS                            Lab Results  Component Value Date   WBC 9.0 10/14/2015   HGB 13.2 10/14/2015   HCT 39.5 10/14/2015   MCV 89.0 10/14/2015   PLT 229 10/14/2015   Lab Results  Component Value Date   CREATININE 0.71 10/14/2015   BUN 12 10/14/2015   NA 141 10/14/2015   K 3.6 10/14/2015   CL 103 10/14/2015   CO2 26 10/14/2015   Lab Results  Component Value Date   INR 1.16 10/14/2015   INR 1.08 10/13/2015   INR 0.97 10/12/2015   10/2015 EKG: NSR, RBBB  10/2015 Echo - Left ventricle: The cavity size was normal. Wall thickness was increased in a pattern of mild LVH. Systolic function was normal. The estimated ejection fraction was in the range of 50% to 55%. Features are consistent with a pseudonormal left ventricular filling pattern, with concomitant abnormal relaxation  and increased filling pressure (grade 2 diastolic dysfunction). - Aortic valve: Moderately to severely calcified annulus. There was mild stenosis. There was mild regurgitation. Valve area (VTI): 1.3 cm^2. Valve area (Vmax): 1.09 cm^2. Valve area (Vmean): 1.29 cm^2. - Mitral valve: Mildly calcified annulus. There was mild regurgitation. - Left atrium: The atrium was mildly dilated.      Anesthesia Physical Anesthesia Plan  ASA: IV  Anesthesia Plan: General   Post-op Pain Management:    Induction: Intravenous  Airway Management Planned: Oral ETT and Video Laryngoscope Planned  Additional Equipment: Arterial line, CVP, PA Cath, TEE and 3D TEE  Intra-op Plan:   Post-operative Plan: Extubation in OR  Informed Consent: I have reviewed the patients History and Physical, chart, labs and discussed the procedure including the risks, benefits and alternatives for the proposed anesthesia with the patient or authorized representative who has indicated his/her understanding and acceptance.   Dental advisory given  Plan Discussed with: CRNA  Anesthesia Plan Comments:        Anesthesia Quick Evaluation

## 2015-10-14 NOTE — Care Management Note (Signed)
Case Management Note  Patient Details  Name: Christian Rasmussen MRN: [id number] Date of Birth: [birth year]  Subjective/Objective:  Pt admitted for Nstemi. Post Cardiac Cath- revealed Multivessel disease. Plan for CABG. Pt is from home with wife.                   Action/Plan: CM will continue to monitor for additional needs post procedure.    Expected Discharge Date:                  Expected Discharge Plan:  [place]  In-House Referral:     Discharge planning Services  CM Consult  Post Acute Care Choice:    Choice offered to:     DME Arranged:    DME Agency:     HH Arranged:    [place] Agency:     Status of Service:  In process, will continue to follow  Medicare Important Message Given:    Date Medicare IM Given:    Medicare IM give by:    Date Additional Medicare IM Given:    Additional Medicare Important Message give by:     If discussed at [place] of Stay Meetings, dates discussed:    Additional Comments:  Bethena Roys, RN 10/14/2015, 3:14 PM

## 2015-10-14 NOTE — Progress Notes (Signed)
CARDIAC REHAB PHASE I   Pt has been up ad lib in the room, no mobility concerns at this time, will postpone ambulation in hallway until post-op given LM disease. Pre-op education completed with pt at bedside. Reviewed IS, sternal precautions, activity progression, cardiac surgery booklet and cardiac surgery guidelines. Provided instructions to view cardiac surgery videos. Pt verbalized understanding. Pt in bed, call bell within reach. Will follow post-op.  [id number] Lenna Sciara, RN, BSN 10/14/2015 12:16 PM

## 2015-10-14 NOTE — Progress Notes (Signed)
TCTS BRIEF PROGRESS NOTE   Patient seen and examined, chart and cath reviewed.  We tentatively plan CABG in am tomorrow.  Mr Cheeves needs an ECHO done ASAP today to evaluate murmur noted on physical exam.  Full consult note to follow.  Rexene Alberts, MD 10/14/2015 9:30 AM

## 2015-10-14 NOTE — Progress Notes (Signed)
  Echocardiogram 2D Echocardiogram has been performed.  Darlina Sicilian M 10/14/2015, 10:56 AM

## 2015-10-14 NOTE — Progress Notes (Signed)
ANTICOAGULATION CONSULT NOTE - Follow Up Consult  Pharmacy Consult for Heparin Indication: LM, severe 3v CAD awaiting CABG  No Known Allergies  Patient Measurements: Height: 5\' 11"  (180.3 cm) Weight: 236 lb 9.6 oz (107.321 kg) IBW/kg (Calculated) : 75.3 Heparin Dosing Weight: 99kg  Vital Signs: Temp: 98.2 F (36.8 C) (06/06 0445) Temp Source: Oral (06/06 0445) BP: 155/77 mmHg (06/06 0445) Pulse Rate: 62 (06/06 0445)  Labs:  Recent Labs  10/12/15 0930 10/12/15 1619  10/12/15 2020 10/13/15 0214 10/13/15 0949 10/14/15 0431 10/14/15 0701  HGB 13.9  --   --   --  12.6*  --  13.2  --   HCT 42.1  --   --   --  38.0*  --  39.5  --   PLT 262  --   --   --  214  --  229  --   APTT  --   --   --   --   --   --  58*  --   LABPROT 13.1  --   --   --  14.2  --  15.0  --   INR 0.97  --   --   --  1.08  --  1.16  --   HEPARINUNFRC  --  0.24*  < >  --  0.35 0.30  --  0.33  CREATININE 0.77  --   --   --  0.77  --  0.71  --   TROPONINI 0.09* 0.08*  --  0.08* 0.06*  --   --   --   < > = values in this interval not displayed.  Estimated Creatinine Clearance: 104 mL/min (by C-G formula based on Cr of 0.71).   Medications:  Heparin @ 1600 units/hr  Assessment: 74yom admitted with NSTEMI, s/p cath yesterday found to have left main and severe 3 vessel CAD. Heparin resumed post-cath pending CABG 6/7. Heparin level is therapeutic at 0.33. CBC stable. No bleeding.  Goal of Therapy:  Heparin level 0.3-0.7 units/ml Monitor platelets by anticoagulation protocol: Yes   Plan:  1) Continue heparin at 1600 units/hr 2) CABG tomorrow  Deboraha Sprang 10/14/2015,8:33 AM

## 2015-10-14 NOTE — Consult Note (Addendum)
[address]       [place] [address]             [phone]          CARDIOTHORACIC SURGERY CONSULTATION REPORT  PCP is Monico Blitz, MD Referring Provider is Martinique, Ander Slade, MD Primary Cardiologist is Herminio Commons, MD  Reason for consultation:  Severe 3-vessel CAD  HPI:  Patient is a 75 year old obese white male with known history of coronary artery disease,atrial flutter status post ablation hypertension, type 2 diabetes mellitus,hyperlipidemia, and a strong family history of coronary artery disease who has been referred for surgical consultation to discuss treatment options for management of severe multivessel coronary artery disease.  The patient's cardiac history dates back to 2004 when he presented with symptoms of exertional shortness of breath. He underwent a stress test that was abnormal. Catheterization revealed severe single-vessel coronary artery disease involving the right coronary artery. He was treated with PCI and stenting of the right coronary artery.He did well for many years. In 2014 he was found to be in atrial flutter. He underwent successful catheter-based ablation.  He has been followed regularly ever since, most recently by Dr. Bronson Ing.  Approximately 3 weeks ago the patient began to experience new onset exertional shortness of breath associated with vague chest discomfort. Symptoms have only occurred with exertion and are typically promptly relieved by rest. He presented to the emergency department at [facility] where EKG revealed sinus rhythm with right bundle branch block. Troponin levels were mildly elevated at 0.09.The patient was transferred to [facility] where he underwent diagnostic cardiac catheterization by Dr. Martinique. Catheterization reveals severe multivessel coronary artery disease with preserved left ventricular function. Cardiothoracic surgical consultation was requested.  The patient is married  and lives with his wife in [place].  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 He otherwise lives a sedentary lifestyle.  He has been overweight for much of his adult life. He denies any history of prolonged episodes of chest discomfort or shortness of breath at rest. He denies any history of PND, orthopnea, palpitations, dizzy spells, syncope, or lower extremity edema.   Past Medical History  Diagnosis Date  . CAD (coronary artery disease)     status post Taxus stent patency of RCA 2004, Cardiolite negative for ischemia in 2010.  Marland Kitchen Dyslipidemia   . Hypertension   . Right bundle branch block (RBBB) with left anterior hemiblock   . Typical atrial flutter ([place])     s/p ablation 11-23-2012 by Dr Rayann Heman  . Overweight   . Diabetes mellitus ([place])   . Snores   . History of kidney stones     Past Surgical History  Procedure Laterality Date  . Ankle surgery Left     total ankle replacement  . Ablation  11-23-2012    s/p cavotricuspid isthmus ablation by Dr Rayann Heman  . Coronary angioplasty      3 stents  . Cataract extraction Right   . Cataract extraction w/phaco Left 09/13/2013    Procedure: CATARACT EXTRACTION PHACO AND INTRAOCULAR LENS PLACEMENT (IOC);  Surgeon: Tonny Branch, MD;  Location: [facility] ORS;  Service: Ophthalmology;  Laterality: Left;  CDE 9.38  . Atrial flutter ablation N/A 11/23/2012    Procedure: ATRIAL FLUTTER ABLATION;  Surgeon: Thompson Grayer, MD;  Location: [facility] CATH LAB;  Service: Cardiovascular;  Laterality: N/A;  . Colonoscopy N/A 10/10/2014    Procedure: COLONOSCOPY;  Surgeon: Rogene Houston, MD;  Location: [facility]  ENDO SUITE;  Service: Endoscopy;  Laterality: N/A;  830  . Cardiac catheterization N/A 10/13/2015    Procedure: Left Heart Cath and Coronary Angiography;  Surgeon: Peter M Martinique, MD;  Location: [place] CV LAB;  Service: Cardiovascular;  Laterality: N/A;    Family History  Problem Relation Age of Onset  . Other Father 19    Died from MI.  . Other Mother     alive & well  .  Leukemia Brother 65    died    Social History   Social History  . Marital Status: Married    Spouse Name: N/A  . Number of Children: N/A  . 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 Smoking status: Former Smoker -- 1.00 packs/day for 28 years    Types: Cigarettes    Start date: 10/21/1956    Quit date: 05/10/1985  . Smokeless tobacco: Never Used  . Alcohol Use: 0.0 oz/week    0 Standard drinks or equivalent per week     Comment: occasional wine  . Drug Use: No  . Sexual Activity: Yes    Birth Control/ Protection: None   Other Topics Concern  . Not on file   Social History Narrative   Lives in [place].   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 Authorizing Provider  aspirin EC 81 MG tablet Take 1 tablet (81 mg total) by mouth daily. 01/04/13  Yes Thompson Grayer, MD  ASTAXANTHIN PO Take 10 mg by mouth daily.   Yes Historical Provider, MD  atorvastatin (LIPITOR) 40 MG tablet Take 1 tablet by mouth daily. 02/19/13  Yes Historical Provider, MD  Berberine Chloride POWD Take 1,200 mg by mouth daily.   Yes Historical Provider, MD  carvedilol (COREG) 6.25 MG tablet TAKE 1 TABLET TWICE DAILY 12/02/14  Yes Herminio Commons, MD  chlorthalidone (HYGROTON) 25 MG tablet Take 25 mg by mouth daily.   Yes Historical Provider, MD  Coenzyme Q10 (CO Q10) 100 MG CAPS Take 1 tablet by mouth daily.   Yes Historical Provider, MD  fish oil-omega-3 fatty acids 1000 MG capsule Take 2 g by mouth daily.   Yes Historical Provider, MD  KRILL OIL 1000 MG CAPS Take 1 capsule by mouth daily.    Yes Historical Provider, MD  lisinopril (PRINIVIL,ZESTRIL) 20 MG tablet Take 1 tablet (20 mg total) by mouth daily. 03/12/11  Yes Ezra Sites, MD  metFORMIN (GLUCOPHAGE) 500 MG tablet Take 1,000 mg by mouth 2 (two) times daily.  08/01/14  Yes Historical Provider, MD    Multiple Vitamin (MULITIVITAMIN WITH MINERALS) TABS Take 1 tablet by mouth daily.   Yes Historical Provider, MD  OVER THE COUNTER MEDICATION Take 1 capsule by mouth 3 (three) times daily. Glucose Essentials- Chromium, [place] kino tree extract, Gymnema, Tursaline Extract,Vanadyl Sulfate, Banaba Extract   Yes Historical Provider, MD    Current Facility-Administered Medications  Medication Dose Route Frequency Provider Last Rate Last Dose  . 0.9 %  sodium chloride infusion  250 mL Intravenous PRN Peter M Martinique, MD      . acetaminophen (TYLENOL) tablet 650 mg  650 mg Oral Q4H PRN Erma Heritage, [place]      . aspirin EC tablet 81 mg  81 mg Oral Daily Fransisco Hertz [place], [place]   81 mg at 10/14/15  [id number]  . atorvastatin (LIPITOR) tablet 40 mg  40 mg Oral Daily Erma Heritage, [place]   40 mg at 10/14/15 0859  . carvedilol (COREG) tablet 6.25 mg  6.25 mg Oral BID Erma Heritage, PA   6.25 mg at 10/14/15 0859  . heparin ADULT infusion 100 units/mL (25000 units/226mL sodium chloride 0.45%)  1,600 Units/hr Intravenous Continuous Otilio Miu, RPH 16 mL/hr at 10/13/15 2300 1,600 Units/hr at 10/13/15 2300  . insulin aspart (novoLOG) injection 0-15 Units  0-15 Units Subcutaneous TID WC Erma Heritage, [place]   3 Units at 10/14/15 0900  . lisinopril (PRINIVIL,ZESTRIL) tablet 20 mg  20 mg Oral Daily Erma Heritage, [place]   20 mg at 10/14/15 0900  . nitroGLYCERIN (NITROSTAT) SL tablet 0.4 mg  0.4 mg Sublingual Q5 Min x 3 PRN Erma Heritage, [place]      . sodium chloride flush (NS) 0.9 % injection 3 mL  3 mL Intravenous Q12H Peter M Martinique, MD   3 mL at 10/14/15 0902  . sodium chloride flush (NS) 0.9 % injection 3 mL  3 mL Intravenous PRN Peter M Martinique, MD        No Known Allergies    Review of Systems:   General:  normal appetite, normal energy, no weight gain, no weight loss, no fever  Cardiac:  + chest pain with exertion, no chest pain at rest, +SOB with exertion, no resting SOB, no PND, no  orthopnea, no palpitations, no arrhythmia, no atrial fibrillation, no LE edema, no dizzy spells, no syncope  Respiratory:  no shortness of breath, no home oxygen, no productive cough, no dry cough, no bronchitis, no wheezing, no hemoptysis, no asthma, no pain with inspiration or cough, no sleep apnea, no CPAP at night  GI:   no difficulty swallowing, no reflux, no frequent heartburn, no hiatal hernia, no abdominal pain, no constipation, no diarrhea, no hematochezia, no hematemesis, no melena  GU:   no dysuria,  no frequency, no urinary tract infection, no hematuria, no enlarged prostate, no kidney stones, no kidney disease  Vascular:  no pain suggestive of claudication, + mild pain in feet, no leg cramps, no varicose veins, no DVT, no non-healing foot ulcer  Neuro:   no stroke, no TIA's, no seizures, no headaches, no temporary blindness one eye,  no slurred speech, no peripheral neuropathy, no chronic pain, no instability of gait, no memory/cognitive dysfunction  Musculoskeletal: + arthritis - primarily involving the right ankle, no joint swelling, no myalgias, no difficulty walking, normal mobility   Skin:   no rash, no itching, no skin infections, no pressure sores or ulcerations  Psych:   no anxiety, no depression, no nervousness, no unusual recent stress  Eyes:   no blurry vision, no floaters, no recent vision changes, no wears glasses or contacts  ENT:   no hearing loss, no loose or painful teeth, no dentures, last saw dentist no  Hematologic:  no easy bruising, no abnormal bleeding, no clotting disorder, no frequent epistaxis  Endocrine:  + diabetes, does not check CBG's at home     Physical Exam:   BP 139/74 mmHg  Pulse 74  Temp(Src) 98.3 F (36.8 C) (Oral)  Resp 20  Ht 5\' 11"  (1.803 m)  Wt 236 lb 9.6 oz (107.321 kg)  BMI 33.01 kg/m2  SpO2 96%  General:  Obese, otherwise well-appearing  HEENT:  Unremarkable   Neck:   no JVD, no bruits, no adenopathy   Chest:  clear to  auscultation, symmetrical breath sounds, no wheezes, no rhonchi   CV:   RRR, grade III/VI systolic murmur best along sternal border  Abdomen:  soft, non-tender, no masses   Extremities:  warm, well-perfused, pulses diminished but palpable, no lower extremity edema  Rectal/GU  Deferred  Neuro:   Grossly non-focal and symmetrical throughout  Skin:   Clean and dry, no rashes, no breakdown  Diagnostic Tests:  CARDIAC CATHETERIZATION Procedures    Left Heart Cath and Coronary Angiography    Conclusion     Mid RCA to Dist RCA lesion, 100% stenosed. The lesion was previously treated with a drug-eluting stent greater than two years ago.  Mid RCA lesion, 100% stenosed. The lesion was previously treated with a drug-eluting stent greater than two years ago.  Ost RCA to Prox RCA lesion, 100% stenosed. The lesion was previously treated with a drug-eluting stent greater than two years ago.  Ost LM lesion, 75% stenosed.  Ost LM to LM lesion, 50% stenosed.  Prox Cx to Mid Cx lesion, 50% stenosed.  Prox LAD lesion, 90% stenosed.  The left ventricular systolic function is normal.  1. 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 Good LV function  Plan: recommend CABG     Indications    NSTEMI (non-ST elevated myocardial infarction) ([place]) [I21.4 (ICD-10-CM)]    Technique and Indications    Indication: 75 yo WM with NSTEMI  Procedural Details: The right wrist was prepped, draped, and anesthetized with 1% lidocaine. Using the modified Seldinger technique, a 6 French slender sheath was introduced into the right radial artery. 3 mg of verapamil was administered through the sheath, weight-based unfractionated heparin was administered intravenously. Standard Judkins catheters were used for selective coronary angiography and left ventriculography. Catheter exchanges were performed over an exchange length guidewire. There were no immediate procedural complications. A TR band was used  for radial hemostasis at the completion of the procedure. The patient was transferred to the post catheterization recovery area for further monitoring.  Contrast: 70 cc  During this procedure the patient is administered a total of Versed 1 mg and Fentanyl 25 mg to achieve and maintain moderate conscious sedation. The patient's heart rate, blood pressure, and oxygen saturation are monitored continuously during the procedure. The period of conscious sedation is 22 minutes, of which I was present face-to-face 100% of this time. Estimated blood loss <50 mL. There were no immediate complications during the procedure.    Coronary Findings    Dominance: Right   Left Main   . Ost LM lesion, 75% stenosed. Discrete.   Colon Flattery LM to LM lesion, 50% stenosed. Calcified diffuse.     Left Anterior Descending   . Prox LAD lesion, 90% stenosed. Severely Calcified.     Left Circumflex   . Prox Cx to Mid Cx lesion, 50% stenosed. Diffuse.   . First Obtuse Marginal Branch   The vessel is small in size.     Right Coronary Artery   . Ost RCA to Prox RCA lesion, 100% stenosed. The lesion was previously treated with a drug-eluting stent greater than two years ago.   . Mid RCA lesion, 100% stenosed. The lesion was previously treated with a drug-eluting stent greater than two years ago.   . Mid RCA to Dist RCA lesion, 100% stenosed. The lesion was previously treated with a drug-eluting stent greater than two years ago.   . Right Posterior Descending Artery   RPDA filled by collaterals from 3rd  Sept.      Wall Motion                 Left Heart    Left Ventricle The left ventricular size is normal. The left ventricular systolic function is normal. The left ventricular ejection fraction is 55-65% by visual estimate. There are no wall motion abnormalities in the left ventricle.    Coronary Diagrams    Diagnostic Diagram            Implants     No implant documentation for this case.      PACS Images    Show images for Cardiac catheterization     Link to Procedure Log    Procedure Log      Hemo Data       Most Recent Value   AO Systolic Pressure  [id number] mmHg   AO Diastolic Pressure  62 mmHg   AO Mean  88 mmHg   LV Systolic Pressure  123456 mmHg   LV Diastolic Pressure  1 mmHg   LV EDP  18 mmHg   Arterial Occlusion Pressure Extended Systolic Pressure  123456 mmHg   Arterial Occlusion Pressure Extended Diastolic Pressure  70 mmHg   Arterial Occlusion Pressure Extended Mean Pressure  103 mmHg   Left Ventricular Apex Extended Systolic Pressure  A999333 mmHg   Left Ventricular Apex Extended Diastolic Pressure  6 mmHg   Left Ventricular Apex Extended EDP Pressure  16 mmHg     Transthoracic Echocardiography  Patient: Donyell, Carithers MR #: [id number] Study Date: 10/14/2015 Gender: M Age: 34 Height: 180.3 cm Weight: 107.3 kg BSA: 2.35 m^2 Pt. Status: Room: 3W10C  Andrena Mews, M.D. REFERRING Darylene Price, M.D. ADMITTING Sanda Klein, MD ATTENDING Sanda Klein, MD PERFORMING [facility], Inpatient SONOGRAPHER Darlina Sicilian, RDCS  cc:  ------------------------------------------------------------------- LV EF: 50% - 55%  ------------------------------------------------------------------- Indications: Murmur 785.2.  ------------------------------------------------------------------- History: PMH: RBBB. Dyspnea. Coronary artery disease. PMH: A Flutter. Risk factors: Hypertension. Diabetes mellitus. Dyslipidemia.  ------------------------------------------------------------------- Study Conclusions  - Left ventricle: The cavity size was normal. Wall thickness was  increased in a pattern of mild LVH. Systolic function was normal.  The estimated ejection fraction was in the range of 50% to 55%.  Features are consistent with a pseudonormal left  ventricular  filling pattern, with concomitant abnormal relaxation and  increased filling pressure (grade 2 diastolic dysfunction). - Aortic valve: Moderately to severely calcified annulus. There was  mild stenosis. There was mild regurgitation. Valve area (VTI):  1.3 cm^2. Valve area (Vmax): 1.09 cm^2. Valve area (Vmean): 1.29  cm^2. - Mitral valve: Mildly calcified annulus. There was mild  regurgitation. - Left atrium: The atrium was mildly dilated.  Transthoracic echocardiography. M-mode, complete 2D, spectral Doppler, and color Doppler. Birthdate: Patient birthdate: 1940-05-15. Age: Patient is 75 yr old. Sex: Gender: male. BMI: 33 kg/m^2. Blood pressure: 139/74 Patient status: Inpatient. Study date: Study date: 10/14/2015. Study time: 09:48 AM. Location: Bedside.  -------------------------------------------------------------------  ------------------------------------------------------------------- Left ventricle: The cavity size was normal. Wall thickness was increased in a pattern of mild LVH. Systolic function was normal. The estimated ejection fraction was in the range of 50% to 55%. Features are consistent with a pseudonormal left ventricular filling pattern, with concomitant abnormal relaxation and increased filling pressure (grade 2 diastolic dysfunction).  ------------------------------------------------------------------- Aortic valve: Moderately to severely calcified annulus. Doppler:  There was mild stenosis. There was mild regurgitation. VTI ratio of LVOT to aortic valve: 0.46. Valve area (VTI): 1.3 cm^2. Indexed valve area (  VTI): 0.55 cm^2/m^2. Peak velocity ratio of LVOT to aortic valve: 0.38. Valve area (Vmax): 1.09 cm^2. Indexed valve area (Vmax): 0.46 cm^2/m^2. Mean velocity ratio of LVOT to aortic valve: 0.45. Valve area (Vmean): 1.29 cm^2. Indexed valve area (Vmean): 0.55 cm^2/m^2. Mean gradient (S): 14 mm Hg.  Peak gradient (S): 28 mm Hg.  ------------------------------------------------------------------- Aorta: Aortic root: The aortic root was normal in size. Ascending aorta: The ascending aorta was normal in size.  ------------------------------------------------------------------- Mitral valve: Mildly calcified annulus. Doppler: There was mild regurgitation. Peak gradient (D): 5 mm Hg.  ------------------------------------------------------------------- Left atrium: The atrium was mildly dilated.  ------------------------------------------------------------------- Right ventricle: The cavity size was normal. Systolic function was normal.  ------------------------------------------------------------------- Pulmonic valve: The valve appears to be grossly normal. Doppler: There was no significant regurgitation.  ------------------------------------------------------------------- Tricuspid valve: The valve appears to be grossly normal. Doppler: There was no significant regurgitation.  ------------------------------------------------------------------- Right atrium: The atrium was normal in size.  ------------------------------------------------------------------- Pericardium: There was no pericardial effusion.  ------------------------------------------------------------------- Measurements  Left ventricle Value Reference LV ID, ED, PLAX chordal 43.8 mm 43 - 52 LV ID, ES, PLAX chordal 31.6 mm 23 - 38 LV fx shortening, PLAX chordal (L) 28 % >=29 LV PW thickness, ED 11.7 mm --------- IVS/LV PW ratio, ED 1.15 <=1.3 LV ejection fraction, 1-p A4C 65 % --------- LV end-diastolic volume, 2-p [id number] ml --------- LV end-diastolic volume/bsa,  2-p 52 ml/m^2 --------- LV e&', lateral 7.24 cm/s --------- LV E/e&', lateral 14.78 --------- LV e&', medial 5.7 cm/s --------- LV E/e&', medial 18.77 --------- LV e&', average 6.47 cm/s --------- LV E/e&', average 16.54 ---------  Ventricular septum Value Reference IVS thickness, ED 13.5 mm ---------  LVOT Value Reference LVOT ID, S 19 mm --------- LVOT area 2.84 cm^2 --------- LVOT peak velocity, S 102 cm/s --------- LVOT mean velocity, S 75.7 cm/s --------- LVOT VTI, S 21.8 cm --------- LVOT peak gradient, S 4 mm Hg ---------  Aortic valve Value Reference Aortic valve peak velocity, S 266 cm/s --------- Aortic valve mean velocity, S 167 cm/s --------- Aortic valve VTI, S 47.7 cm --------- Aortic mean gradient, S 14 mm Hg --------- Aortic peak gradient, S 28 mm Hg --------- VTI ratio, LVOT/AV 0.46 --------- Aortic valve area, VTI 1.3 cm^2 --------- Aortic valve area/bsa, VTI 0.55 cm^2/m^2 --------- Velocity ratio, peak, LVOT/AV 0.38 --------- Aortic valve area, peak velocity 1.09 cm^2 --------- Aortic valve area/bsa, peak 0.46 cm^2/m^2 --------- velocity Velocity ratio,  mean, LVOT/AV 0.45 --------- Aortic valve area, mean velocity 1.29 cm^2 --------- Aortic valve area/bsa, mean 0.55 cm^2/m^2 --------- velocity  Aorta Value Reference Aortic root ID, ED 33 mm ---------  Left atrium Value Reference LA ID, A-P, ES 44 mm --------- LA ID/bsa, A-P 1.87 cm/m^2 <=2.2 LA volume, ES, 1-p A4C 59 ml --------- LA volume/bsa, ES, 1-p A4C 25.1 ml/m^2 --------- LA volume, ES, 1-p A2C 36 ml --------- LA volume/bsa, ES, 1-p A2C 15.3 ml/m^2 ---------  Mitral valve Value Reference Mitral E-wave peak velocity 107 cm/s --------- Mitral A-wave peak velocity 112 cm/s --------- Mitral deceleration time (H) 236 ms 150 - 230 Mitral peak gradient, D 5 mm Hg --------- Mitral E/A ratio, peak 1 ---------  Right ventricle Value Reference RV s&', lateral, S 11.8 cm/s ---------  Legend: (L) and (H) mark values outside specified reference range.  ------------------------------------------------------------------- Prepared and Electronically Authenticated by  Mertie Moores, M.D. 2017-06-06T11:24:32     Impression:  Patient has severe multivessel coronary artery disease with preserved left ventricular systolic function. He presents with recent onset symptoms  of exertional shortness of breath and chest pain consistent with unstable angina pectoris I have personally reviewed the patient's diagnostic  catheterization.  The patient has 50-75% stenosis of the left main coronary artery with high grade 90% proximal stenosis of the left anterior descending coronary artery. The right coronary artery is chronically occluded. Terminal branches of the right coronary artery filled via left to right collaterals. There is moderate significant stenosis in the left circumflex territory as well. Left ventricular systolic function appears normal.  I agree the patient would best be treated with surgical revascularization.  The patient also has a prominent systolic murmur on physical exam.  Transthoracic echocardiogram demonstrates severe calcification and thickening involving 1 of the 3 leaflets of the patient's aortic valve. There is mildly restricted leaflet mobility of the other 2 leaflets. There is at least mild aortic stenosis with peak velocity across the aortic valve measured 2.7 m/sec corresponding to mean transvalvular gradient estimated 14 mmHg. There is mild aortic insufficiency and mild mitral regurgitation.  At present there are no clear indications for proceeding with aortic valve replacement at the time of coronary artery bypass grafting, but intraoperative transesophageal echocardiogram should be performed to evaluate this further.    Plan:  I have reviewed the indications, risks, and potential benefits of coronary artery bypass grafting with the patient in his hospital room.  Alternative treatment strategies have been discussed, including the relative risks, benefits and long term prognosis associated with medical therapy, percutaneous coronary intervention, and surgical revascularization.  The patient understands and accepts all potential associated risks of surgery including but not limited to risk of death, stroke or other neurologic complication, myocardial infarction, congestive heart failure, respiratory failure, renal failure, bleeding requiring blood transfusion and/or reexploration, aortic  dissection or other major vascular complication, arrhythmia, heart block or bradycardia requiring permanent pacemaker, pneumonia, pleural effusion, wound infection, pulmonary embolus or other thromboembolic complication, chronic pain or other delayed complications related to median sternotomy, or the late recurrence of symptomatic ischemic heart disease and/or congestive heart failure.  The importance of long term risk modification have been emphasized.  We also discussed the implications of the presence of at least mild aortic stenosis. We established a plan to proceed with intraoperative transesophageal echocardiogram If findings at time of surgery suggest the presence of moderate aortic stenosis and/or other factors which might increase the likelihood of the progression of disease, we will plan to proceed with aortic valve replacement.   The natural history of aortic stenosis was reviewed, as was long term prognosis with medical therapy alone.   Discussion was held comparing the relative risks of mechanical valve replacement with need for lifelong anticoagulation versus use of a bioprosthetic tissue valve and the associated potential for late structural valve deterioration and failure.  This discussion was placed in the context of the patient's particular circumstances, and as a result the patient specifically requests that their valve be replaced using a bioprosthetic tissue valve.  All questions answered.     I spent in excess of 120 minutes during the conduct of this hospital consultation and >50% of this time involved direct face-to-face encounter for counseling and/or coordination of the patient's care.    Valentina Gu. Roxy Manns, MD 10/14/2015 6:11PM

## 2015-10-14 NOTE — Progress Notes (Signed)
1858:  Patient had 5 beats VTACH.  Patient asymptomatic.  Cecilie Kicks NP paged.  Will pass on to night shift RN

## 2015-10-14 NOTE — Progress Notes (Signed)
Subjective:  No chest pain or dyspnea  Objective:   Vital Signs : Filed Vitals:   10/14/15 0024 10/14/15 0445 10/14/15 0842 10/14/15 0900  BP: 133/84 155/77 160/70 139/74  Pulse: 73 62 74   Temp: 97.8 F (36.6 C) 98.2 F (36.8 C) 98.3 F (36.8 C)   TempSrc: Oral Oral Oral   Resp: 20 20    Height:      Weight:  236 lb 9.6 oz (107.321 kg)    SpO2: 94% 96% 96%     Intake/Output from previous day:  Intake/Output Summary (Last 24 hours) at 10/14/15 1154 Last data filed at 10/14/15 0847  Gross per 24 hour  Intake 2954.31 ml  Output      0 ml  Net 2954.31 ml    I/O since admission: +3936  Wt Readings from Last 3 Encounters:  10/14/15 236 lb 9.6 oz (107.321 kg)  07/31/15 243 lb (110.224 kg)  10/10/14 242 lb (109.77 kg)    Medications: . [START ON 10/15/2015] aminocaproic acid (AMICAR) for OHS   Intravenous To OR  . aspirin EC  81 mg Oral Daily  . atorvastatin  40 mg Oral Daily  . carvedilol  6.25 mg Oral BID  . [START ON 10/15/2015] cefUROXime (ZINACEF)  IV  1.5 g Intravenous To OR  . [START ON 10/15/2015] cefUROXime (ZINACEF)  IV  750 mg Intravenous To OR  . [START ON 10/15/2015] dexmedetomidine  0.1-0.7 mcg/kg/hr Intravenous To OR  . [START ON 10/15/2015] DOPamine  0-10 mcg/kg/min Intravenous To OR  . [START ON 10/15/2015] epinephrine  0-10 mcg/min Intravenous To OR  . [START ON 10/15/2015] heparin-papaverine-plasmalyte irrigation   Irrigation To OR  . [START ON 10/15/2015] heparin 30,000 units/NS 1000 mL solution for CELLSAVER   Other To OR  . insulin aspart  0-15 Units Subcutaneous TID WC  . [START ON 10/15/2015] insulin (NOVOLIN-R) infusion   Intravenous To OR  . lisinopril  20 mg Oral Daily  . [START ON 10/15/2015] magnesium sulfate  40 mEq Other To OR  . [START ON 10/15/2015] nitroGLYCERIN  2-200 mcg/min Intravenous To OR  . [START ON 10/15/2015] phenylephrine (NEO-SYNEPHRINE) Adult infusion  30-200 mcg/min Intravenous To OR  . [START ON 10/15/2015] potassium chloride  80 mEq Other  To OR  . sodium chloride flush  3 mL Intravenous Q12H  . [START ON 10/15/2015] vancomycin 1000 mg in NS (1000 ml) irrigation for Dr. Roxy Manns case   Irrigation To OR  . [START ON 10/15/2015] vancomycin  1,500 mg Intravenous To OR    . heparin 1,600 Units/hr (10/13/15 2300)    Physical Exam:   General appearance: alert, cooperative and no distress Neck: no adenopathy, no carotid bruit, no JVD, supple, symmetrical, trachea midline and thyroid not enlarged, symmetric, no tenderness/mass/nodules Lungs: clear to auscultation bilaterally Heart: regular rate and rhythm and 1/6 systolic murmur; no rub, thrills or heaves Abdomen: soft, non-tender; bowel sounds normal; no masses,  no organomegaly Extremities: no edema, redness or tenderness in the calves or thighs Pulses: 2+ and symmetric Skin: Skin color, texture, turgor normal. No rashes or lesions Neurologic: Grossly normal   Rate: 74  Rhythm: normal sinus rhythm  ECG (independently read by me): NSR at 85; LVH, RBBB  Lab Results:   Recent Labs  10/12/15 0930 10/13/15 0214 10/14/15 0431  NA 138 138 141  K 3.6 3.5 3.6  CL 103 104 103  CO2 25 26 26   GLUCOSE 205* 138* 143*  BUN 30* 21* 12  CREATININE 0.77 0.77 0.71  CALCIUM 9.4 9.0 9.6    Hepatic Function Latest Ref Rng 10/14/2015 10/12/2015  Total Protein 6.5 - 8.1 g/dL 6.1(L) 7.0  Albumin 3.5 - 5.0 g/dL 3.3(L) 3.9  AST 15 - 41 U/L 19 22  ALT 17 - 63 U/L 26 27  Alk Phosphatase 38 - 126 U/L 53 55  Total Bilirubin 0.3 - 1.2 mg/dL 0.5 0.6     Recent Labs  10/12/15 0930 10/13/15 0214 10/14/15 0431  WBC 9.2 9.5 9.0  HGB 13.9 12.6* 13.2  HCT 42.1 38.0* 39.5  MCV 91.7 89.8 89.0  PLT 262 214 229     Recent Labs  10/12/15 1619 10/12/15 2020 10/13/15 0214  TROPONINI 0.08* 0.08* 0.06*    No results found for: TSH No results for input(s): HGBA1C in the last 72 hours.   Recent Labs  10/12/15 0930 10/14/15 0431  PROT 7.0 6.1*  ALBUMIN 3.9 3.3*  AST 22 19  ALT 27 26    ALKPHOS 55 53  BILITOT 0.6 0.5    Recent Labs  10/14/15 0431  INR 1.16   BNP (last 3 results) No results for input(s): BNP in the last 8760 hours.  ProBNP (last 3 results) No results for input(s): PROBNP in the last 8760 hours.   Lipid Panel     Component Value Date/Time   CHOL 108 10/14/2015 0431   TRIG 157* 10/14/2015 0431   HDL 34* 10/14/2015 0431   CHOLHDL 3.2 10/14/2015 0431   VLDL 31 10/14/2015 0431   LDLCALC 43 10/14/2015 0431      Imaging:  Dg Chest 2 View  10/14/2015  CLINICAL DATA:  Shortness of breath and atelectasis. EXAM: CHEST  2 VIEW COMPARISON:  10/11/2008 and prior exams FINDINGS: The cardiomediastinal silhouette is unremarkable. COPD/emphysema changes identified. There is no evidence of focal airspace disease, pulmonary edema, suspicious pulmonary nodule/mass, pleural effusion, or pneumothorax. No acute bony abnormalities are identified. IMPRESSION: COPD/emphysema without evidence of acute cardiopulmonary disease. Electronically Signed   By: Margarette Canada M.D.   On: 10/14/2015 08:24   10/12/2015 CARDIAC CATH Conclusion     Mid RCA to Dist RCA lesion, 100% stenosed. The lesion was previously treated with a drug-eluting stent greater than two years ago.  Mid RCA lesion, 100% stenosed. The lesion was previously treated with a drug-eluting stent greater than two years ago.  Ost RCA to Prox RCA lesion, 100% stenosed. The lesion was previously treated with a drug-eluting stent greater than two years ago.  Ost LM lesion, 75% stenosed.  Ost LM to LM lesion, 50% stenosed.  Prox Cx to Mid Cx lesion, 50% stenosed.  Prox LAD lesion, 90% stenosed.  The left ventricular systolic function is normal.  1. 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 Good LV function  Plan: recommend CABG           Implants     ------------------------------------------------------------------- ECHO Study Conclusions  - Left ventricle: The cavity size was  normal. Wall thickness was  increased in a pattern of mild LVH. Systolic function was normal.  The estimated ejection fraction was in the range of 50% to 55%.  Features are consistent with a pseudonormal left ventricular  filling pattern, with concomitant abnormal relaxation and  increased filling pressure (grade 2 diastolic dysfunction). - Aortic valve: Moderately to severely calcified annulus. There was  mild stenosis. There was mild regurgitation. Valve area (VTI):  1.3 cm^2. Valve area (Vmax): 1.09 cm^2. Valve area (Vmean): 1.29  cm^2. - Mitral valve: Mildly calcified annulus. There was mild  regurgitation. - Left atrium: The atrium was mildly dilated.   Assessment/Plan:   Active Problems:   NSTEMI (non-ST elevated myocardial infarction) ([place])   Unstable angina pectoris ([place])    1. NSTEMI (non-ST elevated myocardial infarction) [facility]) - history of CAD s/p PCI to RCA in 2004 with 3 stents. Reports worsening dyspnea with exertion with mild chest discomfort over the past 2-3 weeks, most notable with climbing stairs and relieved with rest.  - EKG showed NSR, HR 85 with RBBB and no acute ischemic changes.  - Troponins are + at 0.08, 0.08, and 0.06 in a plateau distribution   -  Severe multivessel CAD at cath; needs CABG; tentatively set for tomorrow;    2. Cardiac Murmur:  Echo with EF 50 - 55%; moderately severe calcified annulus of AV with mild AS/mild AR; MAC with mild MR.  3. History of Atrial flutter  - s/p ablation 2014 - maintaining NSR. Continue to monitor on telemetry.  4. HTN - BP has been 124/62 - 155/108 while admitted.  - continue home Coreg and Lisinopril. Hold Chlorthalidone in preparation of cath.  5. Type 2 DM - hold Metformin in preparation of cardiac cath. - SSI while admitted.  6. HLD - continue statin therapy.   Troy Sine, MD, [facility] 10/14/2015, 11:54 AM

## 2015-10-14 NOTE — Progress Notes (Signed)
VASCULAR LAB PRELIMINARY  PRELIMINARY  PRELIMINARY  PRELIMINARY  Pre-op Cardiac Surgery  Carotid Findings:  Bilateral:  1-39% ICA stenosis.  Vertebral artery flow is antegrade.     Upper Extremity Right Left  Brachial Pressures 150 Triphasic 138 Triphasic  Radial Waveforms Triphasic Triphasic  Ulnar Waveforms Triphasic Triphasic  Palmar Arch (Allen's Test) Normal Normal   Findings:  Doppler waveforms remained normal bilaterally with both radial and ulnar compressions.    Lower  Extremity Right Left  Dorsalis Pedis 185 Triphasic 165 Triphasic  Posterior Tibial 174 Triphasic 177 Triphasic  Ankle/Brachial Indices 1.2 1.2    Findings:  ABIs and Doppler waveforms are within normal limits bilaterally at rest.   Bryauna Byrum, RVS 10/14/2015, 4:32 PM

## 2015-10-15 ENCOUNTER — Inpatient Hospital Stay (HOSPITAL_COMMUNITY): Payer: Medicare Other

## 2015-10-15 ENCOUNTER — Inpatient Hospital Stay (HOSPITAL_COMMUNITY): Payer: Medicare Other | Admitting: Certified Registered Nurse Anesthetist

## 2015-10-15 ENCOUNTER — Encounter (HOSPITAL_COMMUNITY)
Admission: EM | Disposition: A | Discharge status: Home Health | Payer: Self-pay | Source: Home / Self Care | Attending: Thoracic Surgery (Cardiothoracic Vascular Surgery)

## 2015-10-15 ENCOUNTER — Encounter (HOSPITAL_COMMUNITY): Payer: Self-pay | Admitting: Thoracic Surgery (Cardiothoracic Vascular Surgery)

## 2015-10-15 DIAGNOSIS — Z951 Presence of aortocoronary bypass graft: Secondary | ICD-10-CM

## 2015-10-15 DIAGNOSIS — I340 Nonrheumatic mitral (valve) insufficiency: Secondary | ICD-10-CM

## 2015-10-15 DIAGNOSIS — Z9889 Other specified postprocedural states: Secondary | ICD-10-CM

## 2015-10-15 DIAGNOSIS — I351 Nonrheumatic aortic (valve) insufficiency: Secondary | ICD-10-CM

## 2015-10-15 DIAGNOSIS — I25110 Atherosclerotic heart disease of native coronary artery with unstable angina pectoris: Secondary | ICD-10-CM

## 2015-10-15 DIAGNOSIS — I059 Rheumatic mitral valve disease, unspecified: Secondary | ICD-10-CM | POA: Diagnosis present

## 2015-10-15 HISTORY — DX: Nonrheumatic mitral (valve) insufficiency: I34.0

## 2015-10-15 HISTORY — PX: MITRAL VALVE REPAIR: SHX2039

## 2015-10-15 HISTORY — DX: Presence of aortocoronary bypass graft: Z95.1

## 2015-10-15 HISTORY — DX: Other specified postprocedural states: Z98.890

## 2015-10-15 HISTORY — DX: Nonrheumatic aortic (valve) insufficiency: I35.1

## 2015-10-15 HISTORY — PX: CORONARY ARTERY BYPASS GRAFT: SHX141

## 2015-10-15 HISTORY — PX: TEE WITHOUT CARDIOVERSION: SHX5443

## 2015-10-15 LAB — POCT I-STAT, CHEM 8
BUN: 17 mg/dL (ref 6–20)
BUN: 15 mg/dL (ref 6–20)
BUN: 14 mg/dL (ref 6–20)
BUN: 14 mg/dL (ref 6–20)
BUN: 15 mg/dL (ref 6–20)
BUN: 14 mg/dL (ref 6–20)
BUN: 15 mg/dL (ref 6–20)
BUN: 13 mg/dL (ref 6–20)
BUN: 12 mg/dL (ref 6–20)
BUN: 13 mg/dL (ref 6–20)
BUN: 12 mg/dL (ref 6–20)
CALCIUM ION: 1.28 mmol/L (ref 1.13–1.30)
CALCIUM ION: 1.14 mmol/L (ref 1.13–1.30)
CALCIUM ION: 1.14 mmol/L (ref 1.13–1.30)
CALCIUM ION: 1.3 mmol/L (ref 1.13–1.30)
CALCIUM ION: 1.1 mmol/L — AB (ref 1.13–1.30)
CALCIUM ION: 1.1 mmol/L — AB (ref 1.13–1.30)
CALCIUM ION: 0.91 mmol/L — AB (ref 1.13–1.30)
CALCIUM ION: 0.93 mmol/L — AB (ref 1.13–1.30)
CALCIUM ION: 0.93 mmol/L — AB (ref 1.13–1.30)
CHLORIDE: 102 mmol/L (ref 101–111)
CHLORIDE: 102 mmol/L (ref 101–111)
CHLORIDE: 104 mmol/L (ref 101–111)
CREATININE: 0.4 mg/dL — AB (ref 0.61–1.24)
CREATININE: 0.5 mg/dL — AB (ref 0.61–1.24)
CREATININE: 0.6 mg/dL — AB (ref 0.61–1.24)
CREATININE: 0.4 mg/dL — AB (ref 0.61–1.24)
Calcium, Ion: 1.27 mmol/L (ref 1.13–1.30)
Calcium, Ion: 0.98 mmol/L — ABNORMAL LOW (ref 1.13–1.30)
Chloride: 103 mmol/L (ref 101–111)
Chloride: 102 mmol/L (ref 101–111)
Chloride: 98 mmol/L — ABNORMAL LOW (ref 101–111)
Chloride: 99 mmol/L — ABNORMAL LOW (ref 101–111)
Chloride: 100 mmol/L — ABNORMAL LOW (ref 101–111)
Chloride: 101 mmol/L (ref 101–111)
Chloride: 101 mmol/L (ref 101–111)
Chloride: 103 mmol/L (ref 101–111)
Creatinine, Ser: 0.4 mg/dL — ABNORMAL LOW (ref 0.61–1.24)
Creatinine, Ser: 0.4 mg/dL — ABNORMAL LOW (ref 0.61–1.24)
Creatinine, Ser: 0.4 mg/dL — ABNORMAL LOW (ref 0.61–1.24)
Creatinine, Ser: 0.4 mg/dL — ABNORMAL LOW (ref 0.61–1.24)
Creatinine, Ser: 0.3 mg/dL — ABNORMAL LOW (ref 0.61–1.24)
Creatinine, Ser: 0.4 mg/dL — ABNORMAL LOW (ref 0.61–1.24)
Creatinine, Ser: 0.4 mg/dL — ABNORMAL LOW (ref 0.61–1.24)
GLUCOSE: 143 mg/dL — AB (ref 65–99)
GLUCOSE: 133 mg/dL — AB (ref 65–99)
GLUCOSE: 137 mg/dL — AB (ref 65–99)
GLUCOSE: 144 mg/dL — AB (ref 65–99)
GLUCOSE: 149 mg/dL — AB (ref 65–99)
GLUCOSE: 144 mg/dL — AB (ref 65–99)
GLUCOSE: 212 mg/dL — AB (ref 65–99)
Glucose, Bld: 152 mg/dL — ABNORMAL HIGH (ref 65–99)
Glucose, Bld: 175 mg/dL — ABNORMAL HIGH (ref 65–99)
Glucose, Bld: 203 mg/dL — ABNORMAL HIGH (ref 65–99)
Glucose, Bld: 224 mg/dL — ABNORMAL HIGH (ref 65–99)
HCT: 38 % — ABNORMAL LOW (ref 39.0–52.0)
HCT: 34 % — ABNORMAL LOW (ref 39.0–52.0)
HCT: 24 % — ABNORMAL LOW (ref 39.0–52.0)
HCT: 29 % — ABNORMAL LOW (ref 39.0–52.0)
HCT: 33 % — ABNORMAL LOW (ref 39.0–52.0)
HCT: 24 % — ABNORMAL LOW (ref 39.0–52.0)
HCT: 22 % — ABNORMAL LOW (ref 39.0–52.0)
HEMATOCRIT: 27 % — AB (ref 39.0–52.0)
HEMATOCRIT: 18 % — AB (ref 39.0–52.0)
HEMATOCRIT: 26 % — AB (ref 39.0–52.0)
HEMATOCRIT: 31 % — AB (ref 39.0–52.0)
HEMOGLOBIN: 11.6 g/dL — AB (ref 13.0–17.0)
HEMOGLOBIN: 9.9 g/dL — AB (ref 13.0–17.0)
HEMOGLOBIN: 9.2 g/dL — AB (ref 13.0–17.0)
HEMOGLOBIN: 6.1 g/dL — AB (ref 13.0–17.0)
HEMOGLOBIN: 7.5 g/dL — AB (ref 13.0–17.0)
HEMOGLOBIN: 8.8 g/dL — AB (ref 13.0–17.0)
Hemoglobin: 12.9 g/dL — ABNORMAL LOW (ref 13.0–17.0)
Hemoglobin: 11.2 g/dL — ABNORMAL LOW (ref 13.0–17.0)
Hemoglobin: 8.2 g/dL — ABNORMAL LOW (ref 13.0–17.0)
Hemoglobin: 8.2 g/dL — ABNORMAL LOW (ref 13.0–17.0)
Hemoglobin: 10.5 g/dL — ABNORMAL LOW (ref 13.0–17.0)
POTASSIUM: 3.9 mmol/L (ref 3.5–5.1)
POTASSIUM: 3.9 mmol/L (ref 3.5–5.1)
POTASSIUM: 4.3 mmol/L (ref 3.5–5.1)
Potassium: 3.8 mmol/L (ref 3.5–5.1)
Potassium: 4 mmol/L (ref 3.5–5.1)
Potassium: 4.4 mmol/L (ref 3.5–5.1)
Potassium: 5 mmol/L (ref 3.5–5.1)
Potassium: 4.9 mmol/L (ref 3.5–5.1)
Potassium: 4.2 mmol/L (ref 3.5–5.1)
Potassium: 4.3 mmol/L (ref 3.5–5.1)
Potassium: 4.8 mmol/L (ref 3.5–5.1)
SODIUM: 136 mmol/L (ref 135–145)
SODIUM: 137 mmol/L (ref 135–145)
SODIUM: 137 mmol/L (ref 135–145)
SODIUM: 139 mmol/L (ref 135–145)
SODIUM: 140 mmol/L (ref 135–145)
SODIUM: 139 mmol/L (ref 135–145)
Sodium: 140 mmol/L (ref 135–145)
Sodium: 140 mmol/L (ref 135–145)
Sodium: 137 mmol/L (ref 135–145)
Sodium: 140 mmol/L (ref 135–145)
Sodium: 140 mmol/L (ref 135–145)
TCO2: 30 mmol/L (ref 0–100)
TCO2: 27 mmol/L (ref 0–100)
TCO2: 27 mmol/L (ref 0–100)
TCO2: 28 mmol/L (ref 0–100)
TCO2: 28 mmol/L (ref 0–100)
TCO2: 30 mmol/L (ref 0–100)
TCO2: 30 mmol/L (ref 0–100)
TCO2: 28 mmol/L (ref 0–100)
TCO2: 23 mmol/L (ref 0–100)
TCO2: 25 mmol/L (ref 0–100)
TCO2: 24 mmol/L (ref 0–100)

## 2015-10-15 LAB — POCT I-STAT 3, ART BLOOD GAS (G3+)
ACID-BASE EXCESS: 3 mmol/L — AB (ref 0.0–2.0)
Acid-base deficit: 3 mmol/L — ABNORMAL HIGH (ref 0.0–2.0)
Acid-base deficit: 3 mmol/L — ABNORMAL HIGH (ref 0.0–2.0)
BICARBONATE: 24 meq/L (ref 20.0–24.0)
Bicarbonate: 28 mEq/L — ABNORMAL HIGH (ref 20.0–24.0)
Bicarbonate: 24.5 mEq/L — ABNORMAL HIGH (ref 20.0–24.0)
O2 SAT: 100 %
O2 Saturation: 100 %
O2 Saturation: 93 %
PCO2 ART: 47 mmHg — AB (ref 35.0–45.0)
PCO2 ART: 54.2 mmHg — AB (ref 35.0–45.0)
PCO2 ART: 46.8 mmHg — AB (ref 35.0–45.0)
PH ART: 7.264 — AB (ref 7.350–7.450)
PO2 ART: 281 mmHg — AB (ref 80.0–100.0)
PO2 ART: 70 mmHg — AB (ref 80.0–100.0)
TCO2: 29 mmol/L (ref 0–100)
TCO2: 26 mmol/L (ref 0–100)
TCO2: 25 mmol/L (ref 0–100)
pH, Arterial: 7.384 (ref 7.350–7.450)
pH, Arterial: 7.312 — ABNORMAL LOW (ref 7.350–7.450)
pO2, Arterial: 426 mmHg — ABNORMAL HIGH (ref 80.0–100.0)

## 2015-10-15 LAB — CBC
HCT: 38.9 % — ABNORMAL LOW (ref 39.0–52.0)
HCT: 30.4 % — ABNORMAL LOW (ref 39.0–52.0)
Hemoglobin: 12.9 g/dL — ABNORMAL LOW (ref 13.0–17.0)
Hemoglobin: 10 g/dL — ABNORMAL LOW (ref 13.0–17.0)
MCH: 29.6 pg (ref 26.0–34.0)
MCH: 28.5 pg (ref 26.0–34.0)
MCHC: 33.2 g/dL (ref 30.0–36.0)
MCHC: 32.9 g/dL (ref 30.0–36.0)
MCV: 89.2 fL (ref 78.0–100.0)
MCV: 86.6 fL (ref 78.0–100.0)
PLATELETS: 207 10*3/uL (ref 150–400)
Platelets: 125 10*3/uL — ABNORMAL LOW (ref 150–400)
RBC: 4.36 MIL/uL (ref 4.22–5.81)
RBC: 3.51 MIL/uL — ABNORMAL LOW (ref 4.22–5.81)
RDW: 13.4 % (ref 11.5–15.5)
RDW: 14.8 % (ref 11.5–15.5)
WBC: 8.9 10*3/uL (ref 4.0–10.5)
WBC: 17.9 10*3/uL — ABNORMAL HIGH (ref 4.0–10.5)

## 2015-10-15 LAB — BASIC METABOLIC PANEL
ANION GAP: 7 (ref 5–15)
BUN: 17 mg/dL (ref 6–20)
CALCIUM: 9.3 mg/dL (ref 8.9–10.3)
CO2: 27 mmol/L (ref 22–32)
CREATININE: 0.72 mg/dL (ref 0.61–1.24)
Chloride: 104 mmol/L (ref 101–111)
Glucose, Bld: 147 mg/dL — ABNORMAL HIGH (ref 65–99)
Potassium: 3.7 mmol/L (ref 3.5–5.1)
Sodium: 138 mmol/L (ref 135–145)

## 2015-10-15 LAB — APTT: aPTT: 36 seconds (ref 24–37)

## 2015-10-15 LAB — HEMOGLOBIN A1C
HEMOGLOBIN A1C: 7.1 % — AB (ref 4.8–5.6)
MEAN PLASMA GLUCOSE: 157 mg/dL

## 2015-10-15 LAB — POCT I-STAT 4, (NA,K, GLUC, HGB,HCT)
GLUCOSE: 164 mg/dL — AB (ref 65–99)
HEMATOCRIT: 30 % — AB (ref 39.0–52.0)
HEMOGLOBIN: 10.2 g/dL — AB (ref 13.0–17.0)
POTASSIUM: 3.7 mmol/L (ref 3.5–5.1)
Sodium: 142 mmol/L (ref 135–145)

## 2015-10-15 LAB — PROTIME-INR
INR: 1.71 — ABNORMAL HIGH (ref 0.00–1.49)
Prothrombin Time: 20.1 seconds — ABNORMAL HIGH (ref 11.6–15.2)

## 2015-10-15 LAB — PLATELET COUNT: PLATELETS: 102 10*3/uL — AB (ref 150–400)

## 2015-10-15 LAB — HEMOGLOBIN AND HEMATOCRIT, BLOOD
HCT: 21.9 % — ABNORMAL LOW (ref 39.0–52.0)
Hemoglobin: 7.2 g/dL — ABNORMAL LOW (ref 13.0–17.0)

## 2015-10-15 LAB — GLUCOSE, CAPILLARY
Glucose-Capillary: 137 mg/dL — ABNORMAL HIGH (ref 65–99)
Glucose-Capillary: 52 mg/dL — ABNORMAL LOW (ref 65–99)

## 2015-10-15 LAB — FIBRINOGEN: FIBRINOGEN: 149 mg/dL — AB (ref 204–475)

## 2015-10-15 LAB — PREPARE RBC (CROSSMATCH)

## 2015-10-15 SURGERY — CORONARY ARTERY BYPASS GRAFTING (CABG)
Anesthesia: General | Site: Chest

## 2015-10-15 MED ORDER — FENTANYL CITRATE (PF) 250 MCG/5ML IJ SOLN
INTRAMUSCULAR | Status: AC
  Filled 2015-10-15: qty 20

## 2015-10-15 MED ORDER — SODIUM CHLORIDE 0.9 % IJ SOLN
OROMUCOSAL | Status: DC | PRN
  Administered 2015-10-15: 11:00:00 via TOPICAL
  Administered 2015-10-15 (×2): 4 mL via TOPICAL
  Administered 2015-10-15: 11:00:00 via TOPICAL

## 2015-10-15 MED ORDER — ANTISEPTIC ORAL RINSE SOLUTION (CORINZ)
7.0000 mL | Freq: Four times a day (QID) | OROMUCOSAL | Status: DC
  Administered 2015-10-16 – 2015-10-17 (×7): 7 mL via OROMUCOSAL

## 2015-10-15 MED ORDER — METOPROLOL TARTRATE 12.5 MG HALF TABLET: 12.5000 mg | ORAL_TABLET | Freq: Two times a day (BID) | ORAL | Status: DC

## 2015-10-15 MED ORDER — PROTAMINE SULFATE 10 MG/ML IV SOLN
INTRAVENOUS | Status: DC | PRN
  Administered 2015-10-15: 20 mg via INTRAVENOUS
  Administered 2015-10-15: 330 mg via INTRAVENOUS

## 2015-10-15 MED ORDER — SODIUM CHLORIDE 0.9 % IV SOLN
INTRAVENOUS | Status: DC
  Filled 2015-10-15: qty 20

## 2015-10-15 MED ORDER — MUPIROCIN 2 % EX OINT
1.0000 "application " | TOPICAL_OINTMENT | Freq: Two times a day (BID) | CUTANEOUS | Status: AC
  Administered 2015-10-15 – 2015-10-20 (×10): 1 via NASAL
  Filled 2015-10-15: qty 22

## 2015-10-15 MED ORDER — MIDAZOLAM HCL 5 MG/5ML IJ SOLN
INTRAMUSCULAR | Status: DC | PRN
  Administered 2015-10-15: 1 mg via INTRAVENOUS
  Administered 2015-10-15: 6 mg via INTRAVENOUS
  Administered 2015-10-15: 2 mg via INTRAVENOUS
  Administered 2015-10-15: 1 mg via INTRAVENOUS

## 2015-10-15 MED ORDER — VANCOMYCIN HCL IN DEXTROSE 1-5 GM/200ML-% IV SOLN
1000.0000 mg | Freq: Once | INTRAVENOUS | Status: AC
Start: 2015-10-16 — End: 2015-10-16
  Administered 2015-10-16: 1000 mg via INTRAVENOUS
  Filled 2015-10-15: qty 200

## 2015-10-15 MED ORDER — DOPAMINE-DEXTROSE 3.2-5 MG/ML-% IV SOLN: 0.0000 ug/kg/min | INTRAVENOUS | Status: DC

## 2015-10-15 MED ORDER — SODIUM CHLORIDE 0.9% FLUSH
3.0000 mL | Freq: Two times a day (BID) | INTRAVENOUS | Status: DC
  Administered 2015-10-16 – 2015-10-22 (×7): 3 mL via INTRAVENOUS

## 2015-10-15 MED ORDER — SODIUM CHLORIDE 0.9 % IV SOLN
Freq: Once | INTRAVENOUS | Status: AC
Start: 2015-10-15 — End: 2015-10-15
  Administered 2015-10-15: 20:00:00 via INTRAVENOUS

## 2015-10-15 MED ORDER — DEXTROSE 50 % IV SOLN
19.0000 mL | Freq: Once | INTRAVENOUS | Status: AC
  Administered 2015-10-15: 19 mL via INTRAVENOUS

## 2015-10-15 MED ORDER — PROPOFOL 10 MG/ML IV BOLUS
INTRAVENOUS | Status: AC
  Filled 2015-10-15: qty 20

## 2015-10-15 MED ORDER — SODIUM CHLORIDE 0.9 % IV SOLN: 250.0000 mL | INTRAVENOUS | Status: DC

## 2015-10-15 MED ORDER — CHLORHEXIDINE GLUCONATE CLOTH 2 % EX PADS
6.0000 | MEDICATED_PAD | Freq: Every day | CUTANEOUS | Status: AC
  Administered 2015-10-16 – 2015-10-19 (×5): 6 via TOPICAL

## 2015-10-15 MED ORDER — FENTANYL CITRATE (PF) 100 MCG/2ML IJ SOLN
INTRAMUSCULAR | Status: DC | PRN
  Administered 2015-10-15: 100 ug via INTRAVENOUS
  Administered 2015-10-15: 50 ug via INTRAVENOUS
  Administered 2015-10-15: 150 ug via INTRAVENOUS
  Administered 2015-10-15: 100 ug via INTRAVENOUS
  Administered 2015-10-15: 150 ug via INTRAVENOUS
  Administered 2015-10-15: 100 ug via INTRAVENOUS
  Administered 2015-10-15: 250 ug via INTRAVENOUS
  Administered 2015-10-15 (×4): 100 ug via INTRAVENOUS
  Administered 2015-10-15: 50 ug via INTRAVENOUS
  Administered 2015-10-15 (×2): 100 ug via INTRAVENOUS
  Administered 2015-10-15: 200 ug via INTRAVENOUS

## 2015-10-15 MED ORDER — ACETAMINOPHEN 160 MG/5ML PO SOLN
1000.0000 mg | Freq: Four times a day (QID) | ORAL | Status: DC
  Administered 2015-10-16: 1000 mg
  Filled 2015-10-15: qty 40.6

## 2015-10-15 MED ORDER — DEXTROSE 50 % IV SOLN
INTRAVENOUS | Status: AC
  Filled 2015-10-15: qty 50

## 2015-10-15 MED ORDER — METOPROLOL TARTRATE 5 MG/5ML IV SOLN: 2.5000 mg | INTRAVENOUS | Status: DC | PRN

## 2015-10-15 MED ORDER — ACETAMINOPHEN 160 MG/5ML PO SOLN: 650.0000 mg | Freq: Once | ORAL | Status: AC

## 2015-10-15 MED ORDER — MIDAZOLAM HCL 2 MG/2ML IJ SOLN
2.0000 mg | INTRAMUSCULAR | Status: DC | PRN
  Administered 2015-10-15: 2 mg via INTRAVENOUS
  Filled 2015-10-15 (×2): qty 2

## 2015-10-15 MED ORDER — NOREPINEPHRINE BITARTRATE 1 MG/ML IV SOLN
4000.0000 ug | INTRAVENOUS | Status: DC | PRN
  Administered 2015-10-15: 5 ug/min via INTRAVENOUS

## 2015-10-15 MED ORDER — SODIUM CHLORIDE 0.9% FLUSH: 3.0000 mL | INTRAVENOUS | Status: DC | PRN

## 2015-10-15 MED ORDER — SODIUM CHLORIDE 0.9 % IR SOLN
Status: DC | PRN
  Administered 2015-10-15: 3000 mL

## 2015-10-15 MED ORDER — DEXMEDETOMIDINE HCL IN NACL 400 MCG/100ML IV SOLN
0.4000 ug/kg/h | INTRAVENOUS | Status: DC
  Administered 2015-10-15: 0.2 ug/kg/h via INTRAVENOUS
  Filled 2015-10-15: qty 100

## 2015-10-15 MED ORDER — INSULIN REGULAR BOLUS VIA INFUSION
0.0000 [IU] | Freq: Three times a day (TID) | INTRAVENOUS | Status: AC
  Filled 2015-10-15: qty 10

## 2015-10-15 MED ORDER — LACTATED RINGERS IV SOLN: INTRAVENOUS | Status: DC

## 2015-10-15 MED ORDER — DOPAMINE-DEXTROSE 3.2-5 MG/ML-% IV SOLN
INTRAVENOUS | Status: DC | PRN
  Administered 2015-10-15: 3 ug/kg/min via INTRAVENOUS

## 2015-10-15 MED ORDER — ACETAMINOPHEN 650 MG RE SUPP
650.0000 mg | Freq: Once | RECTAL | Status: AC
  Administered 2015-10-15: 650 mg via RECTAL

## 2015-10-15 MED ORDER — MORPHINE SULFATE (PF) 2 MG/ML IV SOLN
2.0000 mg | INTRAVENOUS | Status: DC | PRN
  Administered 2015-10-15 (×2): 2 mg via INTRAVENOUS
  Filled 2015-10-15: qty 2

## 2015-10-15 MED ORDER — NITROGLYCERIN IN D5W 200-5 MCG/ML-% IV SOLN
INTRAVENOUS | Status: DC | PRN
  Administered 2015-10-15: 5 ug/min via INTRAVENOUS

## 2015-10-15 MED ORDER — FAMOTIDINE IN NACL 20-0.9 MG/50ML-% IV SOLN
20.0000 mg | Freq: Two times a day (BID) | INTRAVENOUS | Status: AC
  Administered 2015-10-15 (×2): 20 mg via INTRAVENOUS
  Filled 2015-10-15: qty 50

## 2015-10-15 MED ORDER — MIDAZOLAM HCL 10 MG/2ML IJ SOLN
INTRAMUSCULAR | Status: AC
  Filled 2015-10-15: qty 2

## 2015-10-15 MED ORDER — ALBUMIN HUMAN 5 % IV SOLN: 250.0000 mL | INTRAVENOUS | Status: AC | PRN

## 2015-10-15 MED ORDER — BISACODYL 5 MG PO TBEC
10.0000 mg | DELAYED_RELEASE_TABLET | Freq: Every day | ORAL | Status: DC
  Administered 2015-10-16 – 2015-10-19 (×4): 10 mg via ORAL
  Filled 2015-10-15 (×6): qty 2

## 2015-10-15 MED ORDER — MORPHINE SULFATE (PF) 2 MG/ML IV SOLN
1.0000 mg | INTRAVENOUS | Status: DC | PRN
  Administered 2015-10-16: 2 mg via INTRAVENOUS
  Filled 2015-10-15: qty 1

## 2015-10-15 MED ORDER — ASPIRIN EC 325 MG PO TBEC
325.0000 mg | DELAYED_RELEASE_TABLET | Freq: Every day | ORAL | Status: DC
  Administered 2015-10-16 – 2015-10-19 (×4): 325 mg via ORAL
  Filled 2015-10-15 (×4): qty 1

## 2015-10-15 MED ORDER — BISACODYL 10 MG RE SUPP: 10.0000 mg | Freq: Every day | RECTAL | Status: DC

## 2015-10-15 MED ORDER — SODIUM CHLORIDE 0.45 % IV SOLN
INTRAVENOUS | Status: DC | PRN
  Administered 2015-10-15: 18:00:00 via INTRAVENOUS

## 2015-10-15 MED ORDER — OXYCODONE HCL 5 MG PO TABS: 5.0000 mg | ORAL_TABLET | ORAL | Status: DC | PRN

## 2015-10-15 MED ORDER — VECURONIUM BROMIDE 10 MG IV SOLR
INTRAVENOUS | Status: DC | PRN
  Administered 2015-10-15 (×7): 5 mg via INTRAVENOUS

## 2015-10-15 MED ORDER — ONDANSETRON HCL 4 MG/2ML IJ SOLN: 4.0000 mg | Freq: Four times a day (QID) | INTRAMUSCULAR | Status: DC | PRN

## 2015-10-15 MED ORDER — SODIUM CHLORIDE 0.9 % IV SOLN
INTRAVENOUS | Status: DC
  Administered 2015-10-15: 3.7 [IU]/h via INTRAVENOUS
  Filled 2015-10-15 (×2): qty 2.5

## 2015-10-15 MED ORDER — PROPOFOL 10 MG/ML IV BOLUS
INTRAVENOUS | Status: DC | PRN
  Administered 2015-10-15: 40 mg via INTRAVENOUS

## 2015-10-15 MED ORDER — ALBUMIN HUMAN 5 % IV SOLN
INTRAVENOUS | Status: DC | PRN
  Administered 2015-10-15 (×2): via INTRAVENOUS

## 2015-10-15 MED ORDER — MILRINONE LACTATE IN DEXTROSE 20-5 MG/100ML-% IV SOLN
0.2500 ug/kg/min | INTRAVENOUS | Status: AC
  Administered 2015-10-15: .2 ug/kg/min via INTRAVENOUS
  Filled 2015-10-15: qty 100

## 2015-10-15 MED ORDER — MILRINONE LACTATE IN DEXTROSE 20-5 MG/100ML-% IV SOLN
0.0000 ug/kg/min | INTRAVENOUS | Status: DC
  Administered 2015-10-16: 0.2 ug/kg/min via INTRAVENOUS
  Filled 2015-10-15: qty 100

## 2015-10-15 MED ORDER — FENTANYL CITRATE (PF) 250 MCG/5ML IJ SOLN
INTRAMUSCULAR | Status: AC
  Filled 2015-10-15: qty 5

## 2015-10-15 MED ORDER — MORPHINE SULFATE (PF) 2 MG/ML IV SOLN: 2.0000 mg | INTRAVENOUS | Status: DC | PRN

## 2015-10-15 MED ORDER — PANTOPRAZOLE SODIUM 40 MG PO TBEC
40.0000 mg | DELAYED_RELEASE_TABLET | Freq: Every day | ORAL | Status: DC
  Administered 2015-10-17 – 2015-10-24 (×8): 40 mg via ORAL
  Filled 2015-10-15 (×8): qty 1

## 2015-10-15 MED ORDER — CHLORHEXIDINE GLUCONATE 0.12 % MT SOLN: 15.0000 mL | OROMUCOSAL | Status: AC

## 2015-10-15 MED ORDER — ROCURONIUM BROMIDE 100 MG/10ML IV SOLN
INTRAVENOUS | Status: DC | PRN
  Administered 2015-10-15: 50 mg via INTRAVENOUS

## 2015-10-15 MED ORDER — ACETAMINOPHEN 500 MG PO TABS
1000.0000 mg | ORAL_TABLET | Freq: Four times a day (QID) | ORAL | Status: AC
  Administered 2015-10-16 – 2015-10-20 (×16): 1000 mg via ORAL
  Filled 2015-10-15 (×17): qty 2

## 2015-10-15 MED ORDER — NITROGLYCERIN 0.2 MG/ML ON CALL CATH LAB
INTRAVENOUS | Status: DC | PRN
  Administered 2015-10-15: 60 ug via INTRAVENOUS

## 2015-10-15 MED ORDER — LACTATED RINGERS IV SOLN: 500.0000 mL | Freq: Once | INTRAVENOUS | Status: DC | PRN

## 2015-10-15 MED ORDER — LACTATED RINGERS IV SOLN
INTRAVENOUS | Status: DC | PRN
  Administered 2015-10-15 (×3): via INTRAVENOUS

## 2015-10-15 MED ORDER — POTASSIUM CHLORIDE 10 MEQ/50ML IV SOLN
10.0000 meq | INTRAVENOUS | Status: AC
  Administered 2015-10-15 (×3): 10 meq via INTRAVENOUS

## 2015-10-15 MED ORDER — NOREPINEPHRINE BITARTRATE 1 MG/ML IV SOLN
0.0000 ug/min | INTRAVENOUS | Status: DC
  Administered 2015-10-15: 8 ug/min via INTRAVENOUS
  Administered 2015-10-16: 9 ug/min via INTRAVENOUS
  Filled 2015-10-15 (×3): qty 4

## 2015-10-15 MED ORDER — PHENYLEPHRINE HCL 10 MG/ML IJ SOLN
0.0000 ug/min | INTRAVENOUS | Status: DC
  Filled 2015-10-15 (×2): qty 2

## 2015-10-15 MED ORDER — ASPIRIN 81 MG PO CHEW: 324.0000 mg | CHEWABLE_TABLET | Freq: Every day | ORAL | Status: DC

## 2015-10-15 MED ORDER — SODIUM CHLORIDE 0.9 % IV SOLN: INTRAVENOUS | Status: DC

## 2015-10-15 MED ORDER — CHLORHEXIDINE GLUCONATE 0.12% ORAL RINSE (MEDLINE KIT)
15.0000 mL | Freq: Two times a day (BID) | OROMUCOSAL | Status: DC
  Administered 2015-10-15 – 2015-10-17 (×5): 15 mL via OROMUCOSAL

## 2015-10-15 MED ORDER — FENTANYL CITRATE (PF) 250 MCG/5ML IJ SOLN
INTRAMUSCULAR | Status: AC
  Filled 2015-10-15: qty 10

## 2015-10-15 MED ORDER — SODIUM CHLORIDE 0.9 % IV SOLN
INTRAVENOUS | Status: DC
Start: 2015-10-15 — End: 2015-10-16
  Administered 2015-10-15: 18:00:00 via INTRAVENOUS

## 2015-10-15 MED ORDER — MAGNESIUM SULFATE 4 GM/100ML IV SOLN
4.0000 g | Freq: Once | INTRAVENOUS | Status: AC
  Administered 2015-10-15: 4 g via INTRAVENOUS
  Filled 2015-10-15: qty 100

## 2015-10-15 MED ORDER — DEXTROSE 5 % IV SOLN
1.5000 g | Freq: Two times a day (BID) | INTRAVENOUS | Status: AC
  Administered 2015-10-15 – 2015-10-17 (×4): 1.5 g via INTRAVENOUS
  Filled 2015-10-15 (×4): qty 1.5

## 2015-10-15 MED ORDER — DEXMEDETOMIDINE HCL IN NACL 200 MCG/50ML IV SOLN
0.0000 ug/kg/h | INTRAVENOUS | Status: DC
  Administered 2015-10-15 (×2): 0.7 ug/kg/h via INTRAVENOUS
  Filled 2015-10-15: qty 50

## 2015-10-15 MED ORDER — NITROGLYCERIN IN D5W 200-5 MCG/ML-% IV SOLN
0.0000 ug/min | INTRAVENOUS | Status: DC
Start: 2015-10-15 — End: 2015-10-17

## 2015-10-15 MED ORDER — HEPARIN SODIUM (PORCINE) 1000 UNIT/ML IJ SOLN
INTRAMUSCULAR | Status: DC | PRN
  Administered 2015-10-15: 39000 [IU] via INTRAVENOUS
  Administered 2015-10-15: 3000 [IU] via INTRAVENOUS

## 2015-10-15 MED ORDER — ETOMIDATE 2 MG/ML IV SOLN
INTRAVENOUS | Status: DC | PRN
  Administered 2015-10-15: 20 mg via INTRAVENOUS

## 2015-10-15 MED ORDER — DOCUSATE SODIUM 100 MG PO CAPS
200.0000 mg | ORAL_CAPSULE | Freq: Every day | ORAL | Status: DC
  Administered 2015-10-16 – 2015-10-24 (×7): 200 mg via ORAL
  Filled 2015-10-15 (×9): qty 2

## 2015-10-15 MED ORDER — TRAMADOL HCL 50 MG PO TABS
50.0000 mg | ORAL_TABLET | ORAL | Status: DC | PRN
  Administered 2015-10-16: 100 mg via ORAL
  Administered 2015-10-16: 50 mg via ORAL
  Administered 2015-10-17 (×3): 100 mg via ORAL
  Filled 2015-10-15 (×3): qty 2
  Filled 2015-10-15: qty 1
  Filled 2015-10-15: qty 2

## 2015-10-15 MED ORDER — LACTATED RINGERS IV SOLN
INTRAVENOUS | Status: DC | PRN
  Administered 2015-10-15: 08:00:00 via INTRAVENOUS

## 2015-10-15 MED ORDER — METOPROLOL TARTRATE 25 MG/10 ML ORAL SUSPENSION: 12.5000 mg | Freq: Two times a day (BID) | ORAL | Status: DC

## 2015-10-15 SURGICAL SUPPLY — 148 items
ADAPTER CARDIO PERF ANTE/RETRO (ADAPTER) ×4 IMPLANT
ADPR PRFSN 84XANTGRD RTRGD (ADAPTER) ×4
APL SKNCLS STERI-STRIP NONHPOA (GAUZE/BANDAGES/DRESSINGS) ×2
APPLIER CLIP 9.375 SM OPEN (CLIP) ×6
APR CLP SM 9.3 20 MLT OPN (CLIP) ×4
BAG DECANTER FOR FLEXI CONT (MISCELLANEOUS) ×6 IMPLANT
BANDAGE ACE 4X5 VEL STRL LF (GAUZE/BANDAGES/DRESSINGS) ×2 IMPLANT
BANDAGE ACE 6X5 VEL STRL LF (GAUZE/BANDAGES/DRESSINGS) ×2 IMPLANT
BANDAGE ELASTIC 4 VELCRO ST LF (GAUZE/BANDAGES/DRESSINGS) ×3 IMPLANT
BANDAGE ELASTIC 6 VELCRO ST LF (GAUZE/BANDAGES/DRESSINGS) ×3 IMPLANT
BASKET HEART (ORDER IN 25'S) (MISCELLANEOUS) ×1
BASKET HEART (ORDER IN 25S) (MISCELLANEOUS) ×2 IMPLANT
BENZOIN TINCTURE PRP APPL 2/3 (GAUZE/BANDAGES/DRESSINGS) ×1 IMPLANT
BLADE STERNUM SYSTEM 6 (BLADE) ×3 IMPLANT
BLADE SURG 11 STRL SS (BLADE) ×4 IMPLANT
BLADE SURG ROTATE 9660 (MISCELLANEOUS) IMPLANT
BNDG GAUZE ELAST 4 BULKY (GAUZE/BANDAGES/DRESSINGS) ×4 IMPLANT
CANISTER SUCTION 2500CC (MISCELLANEOUS) ×3 IMPLANT
CANNULA EZ GLIDE AORTIC 21FR (CANNULA) ×6 IMPLANT
CANNULA FEM VENOUS REMOTE 22FR (CANNULA) ×1 IMPLANT
CANNULA GUNDRY RCSP 15FR (MISCELLANEOUS) ×4 IMPLANT
CANNULA SOFTFLOW AORTIC 7M21FR (CANNULA) ×3 IMPLANT
CANNULA VENNOUS METAL TIP 20FR (CANNULA) ×1 IMPLANT
CATH CPB KIT OWEN (MISCELLANEOUS) ×3 IMPLANT
CATH HEART VENT LEFT (CATHETERS) ×2 IMPLANT
CATH THORACIC 36FR (CATHETERS) ×3 IMPLANT
CATH THORACIC 36FR RT ANG (CATHETERS) ×3 IMPLANT
CLIP APPLIE 9.375 SM OPEN (CLIP) IMPLANT
CLIP RETRACTION 3.0MM CORONARY (MISCELLANEOUS) ×1 IMPLANT
CLIP TI MEDIUM 24 (CLIP) IMPLANT
CLIP TI WIDE RED SMALL 24 (CLIP) IMPLANT
CONN ST 1/4X3/8  BEN (MISCELLANEOUS) ×2
CONN ST 1/4X3/8 BEN (MISCELLANEOUS) IMPLANT
COVER SURGICAL LIGHT HANDLE (MISCELLANEOUS) ×3 IMPLANT
CRADLE DONUT ADULT HEAD (MISCELLANEOUS) ×3 IMPLANT
DEVICE SUT CK QUICK LOAD INDV (Prosthesis & Implant Heart) ×3 IMPLANT
DEVICE SUT CK QUICK LOAD MINI (Prosthesis & Implant Heart) ×1 IMPLANT
DRAIN CHANNEL 19F RND (DRAIN) ×1 IMPLANT
DRAIN CHANNEL 32F RND 10.7 FF (WOUND CARE) ×6 IMPLANT
DRAPE BILATERAL SPLIT (DRAPES) IMPLANT
DRAPE CARDIOVASCULAR INCISE (DRAPES) ×3
DRAPE CV SPLIT W-CLR ANES SCRN (DRAPES) IMPLANT
DRAPE INCISE IOBAN 66X45 STRL (DRAPES) ×6 IMPLANT
DRAPE SLUSH/WARMER DISC (DRAPES) ×3 IMPLANT
DRAPE SRG 135X102X78XABS (DRAPES) ×2 IMPLANT
DRSG AQUACEL AG ADV 3.5X14 (GAUZE/BANDAGES/DRESSINGS) ×1 IMPLANT
ELECT REM PT RETURN 9FT ADLT (ELECTROSURGICAL) ×6
ELECTRODE REM PT RTRN 9FT ADLT (ELECTROSURGICAL) ×4 IMPLANT
EVACUATOR SILICONE 100CC (DRAIN) ×1 IMPLANT
FELT TEFLON 1X6 (MISCELLANEOUS) ×3 IMPLANT
GAUZE SPONGE 4X4 12PLY STRL (GAUZE/BANDAGES/DRESSINGS) ×6 IMPLANT
GLOVE BIO SURGEON STRL SZ 6 (GLOVE) ×2 IMPLANT
GLOVE BIO SURGEON STRL SZ 6.5 (GLOVE) ×2 IMPLANT
GLOVE BIO SURGEON STRL SZ7 (GLOVE) IMPLANT
GLOVE BIO SURGEON STRL SZ7.5 (GLOVE) ×2 IMPLANT
GLOVE BIOGEL PI IND STRL 6 (GLOVE) IMPLANT
GLOVE BIOGEL PI IND STRL 7.0 (GLOVE) IMPLANT
GLOVE BIOGEL PI IND STRL 7.5 (GLOVE) IMPLANT
GLOVE BIOGEL PI INDICATOR 6 (GLOVE) ×3
GLOVE BIOGEL PI INDICATOR 7.0 (GLOVE) ×3
GLOVE BIOGEL PI INDICATOR 7.5 (GLOVE) ×3
GLOVE ORTHO TXT STRL SZ7.5 (GLOVE) ×14 IMPLANT
GOWN STRL REUS W/ TWL LRG LVL3 (GOWN DISPOSABLE) ×8 IMPLANT
GOWN STRL REUS W/TWL LRG LVL3 (GOWN DISPOSABLE) ×36
HEMOSTAT POWDER SURGIFOAM 1G (HEMOSTASIS) ×9 IMPLANT
INSERT FOGARTY XLG (MISCELLANEOUS) ×3 IMPLANT
KIT BASIN OR (CUSTOM PROCEDURE TRAY) ×3 IMPLANT
KIT DILATOR VASC 18G NDL (KITS) ×1 IMPLANT
KIT DRAINAGE VACCUM ASSIST (KITS) ×1 IMPLANT
KIT ROOM TURNOVER OR (KITS) ×3 IMPLANT
KIT SUCTION CATH 14FR (SUCTIONS) ×9 IMPLANT
KIT SUT CK MINI COMBO 4X17 (Prosthesis & Implant Heart) ×1 IMPLANT
KIT VASOVIEW 6 PRO VH 2400 (KITS) ×3 IMPLANT
LEAD PACING MYOCARDI (MISCELLANEOUS) ×3 IMPLANT
LINE VENT (MISCELLANEOUS) ×1 IMPLANT
MARKER GRAFT CORONARY BYPASS (MISCELLANEOUS) ×9 IMPLANT
NS IRRIG 1000ML POUR BTL (IV SOLUTION) ×15 IMPLANT
PACK OPEN HEART (CUSTOM PROCEDURE TRAY) ×3 IMPLANT
PAD ARMBOARD 7.5X6 YLW CONV (MISCELLANEOUS) ×6 IMPLANT
PAD ELECT DEFIB RADIOL ZOLL (MISCELLANEOUS) ×3 IMPLANT
PENCIL BUTTON HOLSTER BLD 10FT (ELECTRODE) ×3 IMPLANT
PUNCH AORTIC ROTATE 4.0MM (MISCELLANEOUS) IMPLANT
PUNCH AORTIC ROTATE 4.5MM 8IN (MISCELLANEOUS) IMPLANT
PUNCH AORTIC ROTATE 5MM 8IN (MISCELLANEOUS) IMPLANT
RING MITRAL MEMO 3D 28MM SMD28 (Prosthesis & Implant Heart) ×1 IMPLANT
SET CARDIOPLEGIA MPS 5001102 (MISCELLANEOUS) ×1 IMPLANT
SET IRRIG TUBING LAPAROSCOPIC (IRRIGATION / IRRIGATOR) ×4 IMPLANT
SOLUTION ANTI FOG 6CC (MISCELLANEOUS) ×3 IMPLANT
SPONGE GAUZE 4X4 12PLY STER LF (GAUZE/BANDAGES/DRESSINGS) ×3 IMPLANT
SPONGE LAP 18X18 X RAY DECT (DISPOSABLE) ×4 IMPLANT
SPONGE LAP 4X18 X RAY DECT (DISPOSABLE) ×1 IMPLANT
SUT BONE WAX W31G (SUTURE) ×3 IMPLANT
SUT ETHIBON 2 0 V 52N 30 (SUTURE) ×6 IMPLANT
SUT ETHIBON EXCEL 2-0 V-5 (SUTURE) IMPLANT
SUT ETHIBOND (SUTURE) ×3 IMPLANT
SUT ETHIBOND 2 0 SH (SUTURE)
SUT ETHIBOND 2 0 SH 36X2 (SUTURE) IMPLANT
SUT ETHIBOND 2 0 V4 (SUTURE) IMPLANT
SUT ETHIBOND 2 0V4 GREEN (SUTURE) IMPLANT
SUT ETHIBOND 2-0 RB-1 WHT (SUTURE) ×3 IMPLANT
SUT ETHIBOND 4 0 RB 1 (SUTURE) IMPLANT
SUT ETHIBOND NAB MH 2-0 36IN (SUTURE) ×1 IMPLANT
SUT ETHIBOND V-5 VALVE (SUTURE) IMPLANT
SUT ETHIBOND X763 2 0 SH 1 (SUTURE) ×10 IMPLANT
SUT ETHILON 3 0 FSL (SUTURE) ×1 IMPLANT
SUT MNCRL AB 3-0 PS2 18 (SUTURE) ×6 IMPLANT
SUT MNCRL AB 4-0 PS2 18 (SUTURE) IMPLANT
SUT PDS AB 1 CTX 36 (SUTURE) ×6 IMPLANT
SUT PROLENE 2 0 SH DA (SUTURE) IMPLANT
SUT PROLENE 3 0 SH DA (SUTURE) ×8 IMPLANT
SUT PROLENE 3 0 SH1 36 (SUTURE) IMPLANT
SUT PROLENE 4 0 RB 1 (SUTURE) ×27
SUT PROLENE 4 0 SH DA (SUTURE) ×4 IMPLANT
SUT PROLENE 4-0 RB1 .5 CRCL 36 (SUTURE) ×4 IMPLANT
SUT PROLENE 5 0 C 1 36 (SUTURE) IMPLANT
SUT PROLENE 6 0 C 1 30 (SUTURE) ×4 IMPLANT
SUT PROLENE 7.0 RB 3 (SUTURE) ×13 IMPLANT
SUT PROLENE 8 0 BV175 6 (SUTURE) ×1 IMPLANT
SUT PROLENE BLUE 7 0 (SUTURE) ×5 IMPLANT
SUT PROLENE POLY MONO (SUTURE) ×2 IMPLANT
SUT PTFE CHORD X 20MM (SUTURE) ×1 IMPLANT
SUT SILK  1 MH (SUTURE) ×3
SUT SILK 1 MH (SUTURE) ×2 IMPLANT
SUT SILK 2 0 SH CR/8 (SUTURE) IMPLANT
SUT SILK 3 0 SH CR/8 (SUTURE) IMPLANT
SUT STEEL 6MS V (SUTURE) IMPLANT
SUT STEEL STERNAL CCS#1 18IN (SUTURE) IMPLANT
SUT STEEL SZ 6 DBL 3X14 BALL (SUTURE) ×1 IMPLANT
SUT VIC AB 1 CTX 36 (SUTURE) ×3
SUT VIC AB 1 CTX36XBRD ANBCTR (SUTURE) IMPLANT
SUT VIC AB 2-0 CT1 27 (SUTURE) ×12
SUT VIC AB 2-0 CT1 TAPERPNT 27 (SUTURE) IMPLANT
SUT VIC AB 2-0 CTX 27 (SUTURE) IMPLANT
SUT VIC AB 3-0 SH 27 (SUTURE)
SUT VIC AB 3-0 SH 27X BRD (SUTURE) IMPLANT
SUT VIC AB 3-0 X1 27 (SUTURE) ×4 IMPLANT
SUT VICRYL 4-0 PS2 18IN ABS (SUTURE) IMPLANT
SUTURE E-PAK OPEN HEART (SUTURE) ×3 IMPLANT
SYSTEM SAHARA CHEST DRAIN ATS (WOUND CARE) ×4 IMPLANT
TAPE CLOTH SURG 4X10 WHT LF (GAUZE/BANDAGES/DRESSINGS) ×1 IMPLANT
TOWEL OR 17X24 6PK STRL BLUE (TOWEL DISPOSABLE) ×6 IMPLANT
TOWEL OR 17X26 10 PK STRL BLUE (TOWEL DISPOSABLE) ×6 IMPLANT
TRAY FOLEY IC TEMP SENS 16FR (CATHETERS) ×3 IMPLANT
TUBING INSUFFLATION (TUBING) ×3 IMPLANT
UNDERPAD 30X30 INCONTINENT (UNDERPADS AND DIAPERS) ×3 IMPLANT
VENT LEFT HEART 12002 (CATHETERS) ×3
WATER STERILE IRR 1000ML POUR (IV SOLUTION) ×6 IMPLANT
YANKAUER SUCT BULB TIP NO VENT (SUCTIONS) ×1 IMPLANT

## 2015-10-15 NOTE — Anesthesia Procedure Notes (Addendum)
Central Venous Catheter Insertion Performed by: anesthesiologist Patient location: Pre-op. Preanesthetic checklist: patient identified, IV checked, site marked, risks and benefits discussed, surgical consent, monitors and equipment checked, pre-op evaluation, timeout performed and anesthesia consent Position: Trendelenburg Lidocaine 1% used for infiltration Landmarks identified and Seldinger technique used Catheter size: 8.5 Fr Central line was placed.Sheath introducer Procedure performed using ultrasound guided technique. Attempts: 1 Following insertion, line sutured and dressing applied. Post procedure assessment: blood return through all ports, free fluid flow and no air. Patient tolerated the procedure well with no immediate complications.    Central Venous Catheter Insertion Performed by: anesthesiologist Patient location: Pre-op. Preanesthetic checklist: patient identified, IV checked, site marked, risks and benefits discussed, surgical consent, monitors and equipment checked, pre-op evaluation, timeout performed and anesthesia consent Landmarks identified PA cath was placed.Swan type and PA catheter depth:thermodilation and 48PA Cath depth:48 Procedure performed without using ultrasound guided technique. Attempts: 1 Patient tolerated the procedure well with no immediate complications.    Procedure Name: Intubation Date/Time: 10/15/2015 8:40 AM Performed by: Clearnce Sorrel Pre-anesthesia Checklist: Patient identified, Emergency Drugs available, Suction available, Patient being monitored and Timeout performed Patient Re-evaluated:Patient Re-evaluated prior to inductionOxygen Delivery Method: Circle system utilized Preoxygenation: Pre-oxygenation with 100% oxygen Intubation Type: IV induction Ventilation: Mask ventilation without difficulty Laryngoscope Size: Glidescope and 4 Grade View: Grade II Tube type: Oral Tube size: 8.0 mm Number of attempts: 1 Placement Confirmation:  ETT inserted through vocal cords under direct vision,  positive ETCO2 and breath sounds checked- equal and bilateral Secured at: 23 cm Tube secured with: Tape Dental Injury: Teeth and Oropharynx as per pre-operative assessment

## 2015-10-15 NOTE — Brief Op Note (Addendum)
10/12/2015 - 10/15/2015      [email or web address]       [place] [address]             [phone]     10/12/2015 - 10/15/2015  3:31 PM  PATIENT:  Christian Rasmussen  75 y.o. male  PRE-OPERATIVE DIAGNOSIS:  Coronary artery disease  POST-OPERATIVE DIAGNOSIS:  Coronary artery disease  PROCEDURE:  Procedure(s): CORONARY ARTERY BYPASS GRAFTING (CABG)X4 LIMA-LAD; SEQ SVG-PD-PL; SVG-OM1 TRANSESOPHAGEAL ECHOCARDIOGRAM (TEE) MITRAL VALVE REPAIR (MVR), # 43 MEMO 3-D RING ANNULOPLASTY AND COMPLEX VALVE REPAIR ENDOSCOPIC GREATER SAPHENOUS VEIN HARVEST(EVH) BILAT LOWER EXTREM  SURGEON:    Rexene Alberts, MD  ASSISTANTS:  John Giovanni, PA-C  ANESTHESIA:   Annye Asa, MD  CROSSCLAMP TIME:   219'  CARDIOPULMONARY BYPASS TIME: 246'  FINDINGS:  Normal LV systolic function  Trileaflet aortic valve with calcification above left coronary leaflet and very mild aortic stenosis, mild (1+/2+) aortic insufficiency  Calcification of posterior mitral annulus with overriding anterior leaflet (elongated primary chordae tendineae with prolapse) and very eccentric jet of mitral valve regurgitation that wrapped around entire left atrium  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 Resection Type: Other. Mitral Leaflet Resection Location: Anterior., Annular Decalicification/debridement. and Neochrods. Number of Neochords Inserted: 6  Implant: Annuloplasty Device: Implant model number [id number], Size 28, Unique Device Identifier [id number].      Mitral Valve Etiology  MV Insufficiency: Severe  MV Disease: Yes.  MV Stenosis: No  mitral valve stenosis.  MV Disease Functional Class: MV Disease Functional Class: Type II. and Type IIIA  Etiology (Choose at least one and up to five): Degenerative.  MV Lesions (Choose at least one): Leaflet calcification.  and Other. CHORDAL ELONGATION  COMPLICATIONS: None  BASELINE WEIGHT: 107 kg  PATIENT DISPOSITION:   TO SICU IN STABLE CONDITION  Rexene Alberts, MD 10/15/2015 5:12 PM

## 2015-10-15 NOTE — Transfer of Care (Signed)
Immediate Anesthesia Transfer of Care Note  Patient: Christian Rasmussen  Procedure(s) Performed: Procedure(s): CORONARY ARTERY BYPASS GRAFTING (CABG) X4 LIMA-LAD; SEQ SVG-PD-PL; SVG-OM1 ENDOSCOPIC GREATER SAPHENOUS VEIN HARVEST(EVH) BILAT LOWER EXTREM (N/A) TRANSESOPHAGEAL ECHOCARDIOGRAM (TEE) (N/A) MITRAL VALVE REPAIR (MVR), # 36 MEMO 3-D RING ANNULOPLASTY AND COMPLEX VALVE REPAIR (N/A)  Patient Location: SICU  Anesthesia Type:General  Level of Consciousness: Patient remains intubated per anesthesia plan  Airway & Oxygen Therapy: Patient remains intubated per anesthesia plan  Post-op Assessment: Report given to RN and Post -op Vital signs reviewed and stable  Post vital signs: Reviewed and stable  Last Vitals:  Filed Vitals:   10/14/15 2053 10/15/15 0500  BP: 129/67 128/69  Pulse: 73 70  Temp: 36.8 C 36.7 C  Resp: 20 20    Last Pain:  Filed Vitals:   10/15/15 1759  PainSc: 0-No pain      Patients Stated Pain Goal: 0 ([id number])  Complications: No apparent anesthesia complications

## 2015-10-15 NOTE — Progress Notes (Signed)
Echocardiogram Echocardiogram Transesophageal has been performed.  Christian Rasmussen 10/15/2015, 11:23 AM

## 2015-10-15 NOTE — Op Note (Signed)
CARDIOTHORACIC SURGERY OPERATIVE NOTE  Date of Procedure:  10/15/2015  Preoperative Diagnosis:   Severe 3-vessel Coronary Artery Disease  Unstable Angina  Mild Aortic Stenosis  Mild Aortic Insufficiency  Mild Mitral Regurgitation  Postoperative Diagnosis:   Severe 3-vessel Coronary Artery Disease  Unstable Angina  Mild Aortic Stenosis  Mild Aortic Insufficiency  Moderate-Severe Mitral Regurgitation  Procedure:    Coronary Artery Bypass Grafting x 4   Left Internal Mammary Artery to Distal Left Anterior Descending Coronary Artery  Saphenous Vein Graft to Posterior Descending Coronary Artery  Sequential Sapheonous Vein Graft to Right Posterolateral Branch Coronary Artery  Saphenous Vein Graft to Obtuse Marginal Branch of Left Circumflex Coronary Artery  Endoscopic Vein Harvest from Bilateral Thighs   Mitral Valve Repair  Complex valvuloplasty including artificial Gore-tex neochord placement x6  Decalcification of posterior mitral annulus  Autologous pericardial patch augmentation of posterior leaflet  Sorin Memo 3D ring annuloplasty (size 28mm, catalog [id number], serial [id number])  Surgeon:  Valentina Gu. 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 with moderate-severe (3+/4+) mitral regurgitation  Good quality LIMA conduit for grafting  Good quality SVG conduit for grafting  Fair to good quality target vessels for  grafting  No residual mitral regurgitation after successful valve repair                  BRIEF CLINICAL NOTE AND INDICATIONS FOR SURGERY  Patient is a 75 year old obese white male with known history of coronary artery disease,atrial flutter status post ablation hypertension, type 2 diabetes mellitus,hyperlipidemia, and a strong family history of coronary artery disease who has been referred for surgical consultation to discuss treatment options for management of severe multivessel coronary artery disease. The patient's cardiac history dates back to 2004 when he presented with symptoms of exertional shortness of breath. He underwent a stress test that was abnormal. Catheterization revealed severe single-vessel coronary artery disease involving the right coronary artery. He was treated with PCI and stenting of the right coronary artery.He did well for many years. In 2014 he was found to be in atrial flutter. He underwent successful catheter-based ablation. He has been followed regularly ever since, most recently by Dr. Bronson Ing. Approximately 3 weeks ago the patient began to experience new onset exertional shortness of breath associated with vague chest discomfort. Symptoms have only occurred with exertion and are typically promptly relieved by rest. He presented to the emergency department at [facility] where EKG revealed sinus rhythm with right bundle branch block. Troponin levels were mildly elevated at 0.09.The patient was transferred to [facility] where he underwent diagnostic cardiac catheterization by Dr. Martinique. Catheterization reveals severe multivessel coronary artery disease with preserved left ventricular function. Cardiothoracic surgical consultation was requested.  The patient was seen in consultation and noted to have a prominent systolic murmur on physical exam.  Transthoracic echocardiogram revealed what appeared to be mild aortic stenosis, mild  aortic insufficiency, and mild mitral regurgitation.  The patient has been counseled at length regarding the indications, risks  and potential benefits of surgery.  All questions have been answered, and the patient provides full informed consent for the operation as described.    DETAILS OF THE OPERATIVE PROCEDURE  Preparation:  The patient is brought to the operating room on the above mentioned date and central monitoring was established by the anesthesia team including placement of Swan-Ganz catheter and radial arterial line. The patient is placed in the supine position on the operating table.  Intravenous antibiotics are administered. General endotracheal anesthesia is induced uneventfully. A Foley catheter is placed.  Baseline transesophageal echocardiogram was performed.  Findings were notable for normal left ventricular systolic function. The aortic valve was trileaflet. There was some calcification involving the left coronary leaflet with mild restriction of that leaflet motion. The other 2 leaflets moved normally. There was very mild aortic stenosis. Peak velocitynmeasured across thenaortic valve ranged between 2.4 and 2.7 m/sec.  There was mild aortic insufficiency.  There was moderate to severe mitral regurgitation. The jet of regurgitation was very eccentric and wraps around the left atrium. The functional pathology of the mitral valve revealed restriction of the posterior leaflet in the P3 region.  Intraoperative consultation from cardiology was requested to review the patient's transesophageal echocardiogram. TEE was evaluated Dr. Loralie Champagne who completely agreed with the assessment and recommended mitral valve repair. Aortic valve replacement was not felt to be indicated.  The patient's chest, abdomen, both groins, and both lower extremities are prepared and draped in a sterile manner. A time out procedure is performed.   Surgical Approach and Conduit Harvest:  A median sternotomy  incision was performed and the left internal mammary artery is dissected from the chest wall and prepared for bypass grafting. The left internal mammary artery is notably good quality conduit. Simultaneously, the greater saphenous vein is obtained from the patient's right thigh using endoscopic vein harvest technique. The saphenous vein is notably good quality conduit. However, the vein was not long enough to provide adequate length for all planned grafts. Subsequently, an additional segment of saphenous vein is obtained from the patient's left thigh using endoscopic vein harvest technique. The left greater saphenous vein was also good quality conduit.  Harvest of the vein on the left side was performed after systemic heparinization and notably technically challenging.  After removal of the saphenous vein, the small surgical incisions in the lower extremity are closed with absorbable suture.   Following systemic heparinization, the left internal mammary artery was transected distally noted to have excellent flow.   Extracorporeal Cardiopulmonary Bypass and Myocardial Protection:  The right common femoral vein is cannulated using the Seldinger technique and a guidewire advanced into the right atrium using TEE guidance.  The patient is heparinized systemically and the right common femoral vein cannulated using a 22 Fr long femoral venous cannula.  The ascending aorta is cannulated for cardiopulmonary bypass.  Adequate heparinization is verified.   A retrograde cardioplegia cannula is placed through the right atrium into the coronary sinus.  The entire pre-bypass portion of the operation was notable for stable hemodynamics.  Cardiopulmonary bypass was begun and the surface of the heart is inspected.  A second venous cannula is placed directly into the superior vena cava.   A cardioplegia cannula is placed in the ascending aorta.  A temperature probe was placed in the interventricular septum.  The operative  field is continuously flooded with carbon dioxide.  The patient is cooled to 28C systemic temperature.  The aortic cross clamp is applied and cold blood  cardioplegia is delivered initially in an antegrade fashion through the aortic root.   Supplemental cardioplegia is given retrograde through the coronary sinus catheter.  Iced saline slush is applied for topical hypothermia.  The initial cardioplegic arrest is rapid with early diastolic arrest.  Repeat doses of cardioplegia are administered intermittently throughout the entire cross clamp portion of the operation through the aortic root, down subsequently placed vein grafts, and through the coronary sinus catheter in order to maintain completely flat electrocardiogram and septal myocardial temperature below 15C.  Myocardial protection was felt to be excellent.   Coronary Artery Bypass Grafting:   The posterior descending branch of the right coronary artery was grafted using a reversed saphenous vein graft in a side-to-side fashion.  At the site of distal anastomosis the target vessel was fair quality and measured approximately 1.4 mm in diameter.  The posterolateral branch of the distal right coronary artery was grafted in and end-to-side fashion using a sequential saphenous vein graft off of the distal segment of the vein graft placed to the posterior descending coronary artery.  At the site of distal anastomosis the target vessel was fair quality and measured approximately 1.5 mm in diameter.  The obtuse marginal branch of the left circumflex coronary artery was grafted using a reversed saphenous vein graft in an end-to-side fashion.  At the site of distal anastomosis the target vessel was good quality and measured approximately 1.7 mm in diameter.  The distal left anterior coronary artery was grafted with the left internal mammary artery in an end-to-side fashion.  At the site of distal anastomosis the target vessel was good quality and measured  approximately 2.0 mm in diameter.   Mitral Valve Repair:  A left atriotomy incision was performed through the interatrial groove and extended partially across the back wall of the left atrium after opening the oblique sinus inferiorly.  The mitral valve is exposed using a self-retaining retractor.  The mitral valve was inspected and notable for the presence of degenerative calcification involving the posterior mitral annulus in the region between P2 and P3 portion of the posterior leaflet This calcification caused restriction of the P3 segment of the posterior leaflet. There was evidence of over-riding corresponding A2 and A3 segments of the anterior leaflet with elongation of the primary chordae tendineae arising from the posterior papillary muscle and inserting on A2.    Interrupted 2-0 Ethibond horizontal mattress sutures were placed circumferentially around the entire mitral annulus.  These sutures will ultimately be utilized for ring annuloplasty, and at this juncture they are utilized to suspend the valve symmetrically.   Artificial neochord placement was performed using Chord-X multi-strand CV-4 Goretex pre-measured loops.  The appropriate cord length was measured from corresponding normal length primary cords from the A3 segment of the anterior leaflet. The papillary muscle suture of the Chord-X multi-strand suture was placed through the head of the posterior papillary muscle in a horizontal mattress fashion and tied over Teflon felt pledgets. Each of the three pre-measured loops were then reimplanted into the free margin of the A2 and A3 segments of the anterior leaflet.    At this juncture the valve was tested with saline and there remains significant restriction of the posterior leaflet because of the annular calcification. The posterior leaflet is subsequently mobilized off of the posterior annulus using a #11 blade knife. The severe calcification in the P2 and P3 region where debrided.  A  portion of the patient's pericardial sac was harvested and trimmed to an elliptical  shape for patch augmentation of the posterior leaflet. The patch was sewn in place using a 2 layer closure of running 4-0 Prolene suture.  The valve was tested with saline and appeared competent even without ring annuloplasty complete. The valve was sized to a 28 mm annuloplasty ring, based upon the transverse distance between the left and right commissures and the height of the anterior leaflet, corresponding to a size just slightly larger than the overall surface area of the anterior leaflet.  A Sorin Memo 3D annuloplasty ring (size 67mm, catalog [id number], serial [id number]) was secured in place uneventfully. All sutures were secured using a Cor-knot device.    The valve is again tested with saline and appears to be perfectly competent with a broad symmetrical line of coaptation of the anterior and posterior leaflet. There is no residual leak. There was a broad, symmetrical line of coaptation of the anterior and posterior leaflet which was confirmed using the blue ink test.  Rewarming is begun.   Procedure Completion:  The left atriotomy was closed using a 2-layer closure of running 3-0 Prolene suture after placing a sump drain across the mitral valve to serve as a left ventricular vent.  All proximal vein graft anastomoses were placed directly to the ascending aorta prior to removal of the aortic cross clamp.  The septal myocardial temperature rose rapidly after reperfusion of the left internal mammary artery graft.  One final dose of warm retrograde "hot shot" cardioplegia was administered retrograde through the coronary sinus catheter while all air was evacuated through the aortic root.  The aortic cross clamp was removed after a total cross clamp time of 219 minutes.  Epicardial pacing wires are fixed to the right ventricular outflow tract and to the right atrial appendage. The patient is rewarmed to 37C temperature.  The aortic and left ventricular vents are removed.  The patient is weaned and disconnected from cardiopulmonary bypass.  The patient's rhythm at separation from bypass was AV paced.  The patient was weaned from cardioplegic bypass on low dose milrinone and dopamine infusions. Total cardiopulmonary bypass time for the operation was 246 minutes.  Followup transesophageal echocardiogram performed after separation from bypass revealed a well-seated mitral annuloplasty ring and a mitral valve that was functioning normally and without any residual mitral regurgitation.  Left ventricular function was unchanged from preoperatively.  Aortic insufficiency was unchanged from preoperatively.  The aortic and superior vena cava cannula were removed uneventfully. Protamine was administered to reverse the anticoagulation. The femoral venous cannula was removed and manual pressure held on the groin for 30 minutes.  The mediastinum and pleural space were inspected for hemostasis and irrigated with saline solution. The mediastinum and both pleural spaces were drained using 4 chest tubes placed through separate stab incisions inferiorly.  The subcutaneous tissues of the left thigh were drained using a Blake drain because of oozing from the soft tissues caused by systemic heparinization. The soft tissues anterior to the aorta were reapproximated loosely. The sternum is closed with double strength sternal wire. The soft tissues anterior to the sternum were closed in multiple layers and the skin is closed with a running subcuticular skin closure.  The post-bypass portion of the operation was notable for stable rhythm and hemodynamics.  The patient received 4 units packed red blood cells during the procedure due to anemia which was present preoperatively and exacerbated by acute blood loss and hemodilution during cardiopulmonary bypass.  The patient received a total of 2 packs adult platelets and 2  units fresh frozen plasma due to  coagulopathy and thrombocytopenia after separation from cardiopulmonary bypass and reversal of heparin with protamine.   Disposition:  The patient tolerated the procedure well.  The patient was transported to the surgical intensive care unit in stable condition. There were no intraoperative complications. All sponge instrument and needle counts are verified correct at completion of the operation.    Valentina Gu. Roxy Manns MD 10/15/2015 5:24 PM

## 2015-10-15 NOTE — Progress Notes (Signed)
Pt. following all commands, able to stick out tongue and lift head off pillow. RT notified at this time to begin weaning. Will continue to monitor.

## 2015-10-15 NOTE — Progress Notes (Signed)
Patient ID: CANDIDO ZUKOSKI, male   DOB: [birth year], 75 y.o.   MRN: [id number] EVENING ROUNDS NOTE :     [address]       [place] [address]             [phone]                 Day of Surgery Procedure(s) (LRB): CORONARY ARTERY BYPASS GRAFTING (CABG) X4 LIMA-LAD; SEQ SVG-PD-PL; SVG-OM1 ENDOSCOPIC GREATER SAPHENOUS VEIN HARVEST(EVH) BILAT LOWER EXTREM (N/A) TRANSESOPHAGEAL ECHOCARDIOGRAM (TEE) (N/A) MITRAL VALVE REPAIR (MVR), # 28 MEMO 3-D RING ANNULOPLASTY AND COMPLEX VALVE REPAIR (N/A)  Total Length of Stay:  LOS: 3 days  BP 128/69 mmHg  Pulse 70  Temp(Src) 98.1 F (36.7 C) (Oral)  Resp 20  Ht 5\' 11"  (1.803 m)  Wt 235 lb 6.4 oz (106.777 kg)  BMI 32.85 kg/m2  SpO2 96%  .Intake/Output      06/06 0701 - 06/07 0700 06/07 0701 - 06/08 0700   P.O. 660    I.V. (mL/kg)  3200 (30)   Blood  2260   IV Piggyback  500   Total Intake(mL/kg) 660 (6.2) 5960 (55.8)   Urine (mL/kg/hr)  1800 (1.5)   Blood  800 (0.7)   Total Output   2600   Net +660 +3360          . sodium chloride    . sodium chloride    . [START ON 10/16/2015] sodium chloride    . sodium chloride    . dexmedetomidine    . DOPamine    . insulin (NOVOLIN-R) infusion    . lactated ringers    . lactated ringers 20 mL/hr at 10/15/15 1745  . milrinone    . nitroGLYCERIN Stopped (10/15/15 1745)  . norepinephrine (LEVOPHED) Adult infusion    . phenylephrine (NEO-SYNEPHRINE) Adult infusion Stopped (10/15/15 1745)     Lab Results  Component Value Date   WBC 8.9 10/15/2015   HGB 10.2* 10/15/2015   HCT 30.0* 10/15/2015   PLT 102* 10/15/2015   GLUCOSE 164* 10/15/2015   CHOL 108 10/14/2015   TRIG 157* 10/14/2015   HDL 34* 10/14/2015   LDLCALC 43 10/14/2015   ALT 26 10/14/2015   AST 19 10/14/2015   NA 142 10/15/2015   K 3.7 10/15/2015   CL 104 10/15/2015   CREATININE 0.40* 10/15/2015   BUN 12 10/15/2015   CO2 27 10/15/2015   INR 1.16 10/14/2015   HGBA1C 7.1* 10/14/2015   Just back from OR,  small amount in chest tubes, difficult intubation  Paced   Grace Isaac MD  Beeper [phone] Office [phone] 10/15/2015 6:09 PM

## 2015-10-15 NOTE — OR Nursing (Signed)
2nd call made to SICU @1647 , providing a patient update.

## 2015-10-15 NOTE — OR Nursing (Signed)
1st call made @1634 , providing a patient update to [place] on 2S.  Plans for transfer to Room 5 postoperatively.

## 2015-10-15 NOTE — Progress Notes (Signed)
Beginning Rapid Wean Protocol, patient tolerating it well. RT and RN at bedside.

## 2015-10-15 NOTE — Progress Notes (Signed)
Rapid Wean Protocol ended due to tachypnea and increased work of breathing.

## 2015-10-16 ENCOUNTER — Inpatient Hospital Stay (HOSPITAL_COMMUNITY): Payer: Medicare Other

## 2015-10-16 ENCOUNTER — Encounter (HOSPITAL_COMMUNITY): Payer: Self-pay | Admitting: Thoracic Surgery (Cardiothoracic Vascular Surgery)

## 2015-10-16 DIAGNOSIS — Z9889 Other specified postprocedural states: Secondary | ICD-10-CM

## 2015-10-16 LAB — POCT I-STAT, CHEM 8
BUN: 12 mg/dL (ref 6–20)
BUN: 14 mg/dL (ref 6–20)
CALCIUM ION: 1.06 mmol/L — AB (ref 1.13–1.30)
CALCIUM ION: 1.08 mmol/L — AB (ref 1.13–1.30)
CHLORIDE: 103 mmol/L (ref 101–111)
CREATININE: 0.7 mg/dL (ref 0.61–1.24)
Chloride: 103 mmol/L (ref 101–111)
Creatinine, Ser: 0.7 mg/dL (ref 0.61–1.24)
GLUCOSE: 158 mg/dL — AB (ref 65–99)
GLUCOSE: 163 mg/dL — AB (ref 65–99)
HCT: 25 % — ABNORMAL LOW (ref 39.0–52.0)
HCT: 27 % — ABNORMAL LOW (ref 39.0–52.0)
HEMOGLOBIN: 8.5 g/dL — AB (ref 13.0–17.0)
HEMOGLOBIN: 9.2 g/dL — AB (ref 13.0–17.0)
Potassium: 3.6 mmol/L (ref 3.5–5.1)
Potassium: 4.3 mmol/L (ref 3.5–5.1)
SODIUM: 140 mmol/L (ref 135–145)
Sodium: 138 mmol/L (ref 135–145)
TCO2: 22 mmol/L (ref 0–100)
TCO2: 20 mmol/L (ref 0–100)

## 2015-10-16 LAB — MAGNESIUM
MAGNESIUM: 2.4 mg/dL (ref 1.7–2.4)
Magnesium: 2.6 mg/dL — ABNORMAL HIGH (ref 1.7–2.4)

## 2015-10-16 LAB — TYPE AND SCREEN
ABO/RH(D): A POS
Antibody Screen: NEGATIVE
UNIT DIVISION: 0
UNIT DIVISION: 0
UNIT DIVISION: 0
Unit division: 0

## 2015-10-16 LAB — GLUCOSE, CAPILLARY
GLUCOSE-CAPILLARY: 109 mg/dL — AB (ref 65–99)
GLUCOSE-CAPILLARY: 113 mg/dL — AB (ref 65–99)
GLUCOSE-CAPILLARY: 123 mg/dL — AB (ref 65–99)
GLUCOSE-CAPILLARY: 124 mg/dL — AB (ref 65–99)
GLUCOSE-CAPILLARY: 129 mg/dL — AB (ref 65–99)
GLUCOSE-CAPILLARY: 154 mg/dL — AB (ref 65–99)
GLUCOSE-CAPILLARY: 156 mg/dL — AB (ref 65–99)
GLUCOSE-CAPILLARY: 156 mg/dL — AB (ref 65–99)
GLUCOSE-CAPILLARY: 159 mg/dL — AB (ref 65–99)
GLUCOSE-CAPILLARY: 164 mg/dL — AB (ref 65–99)
GLUCOSE-CAPILLARY: 185 mg/dL — AB (ref 65–99)
Glucose-Capillary: 157 mg/dL — ABNORMAL HIGH (ref 65–99)
Glucose-Capillary: 162 mg/dL — ABNORMAL HIGH (ref 65–99)
Glucose-Capillary: 167 mg/dL — ABNORMAL HIGH (ref 65–99)
Glucose-Capillary: 145 mg/dL — ABNORMAL HIGH (ref 65–99)
Glucose-Capillary: 136 mg/dL — ABNORMAL HIGH (ref 65–99)
Glucose-Capillary: 127 mg/dL — ABNORMAL HIGH (ref 65–99)
Glucose-Capillary: 122 mg/dL — ABNORMAL HIGH (ref 65–99)
Glucose-Capillary: 117 mg/dL — ABNORMAL HIGH (ref 65–99)

## 2015-10-16 LAB — CBC
HCT: 27.5 % — ABNORMAL LOW (ref 39.0–52.0)
HCT: 27.6 % — ABNORMAL LOW (ref 39.0–52.0)
Hemoglobin: 9.2 g/dL — ABNORMAL LOW (ref 13.0–17.0)
Hemoglobin: 9.3 g/dL — ABNORMAL LOW (ref 13.0–17.0)
MCH: 28.9 pg (ref 26.0–34.0)
MCH: 29.1 pg (ref 26.0–34.0)
MCHC: 33.5 g/dL (ref 30.0–36.0)
MCHC: 33.7 g/dL (ref 30.0–36.0)
MCV: 86.5 fL (ref 78.0–100.0)
MCV: 86.3 fL (ref 78.0–100.0)
PLATELETS: 155 10*3/uL (ref 150–400)
PLATELETS: 131 10*3/uL — AB (ref 150–400)
RBC: 3.18 MIL/uL — AB (ref 4.22–5.81)
RBC: 3.2 MIL/uL — ABNORMAL LOW (ref 4.22–5.81)
RDW: 15.5 % (ref 11.5–15.5)
RDW: 15.9 % — ABNORMAL HIGH (ref 11.5–15.5)
WBC: 18.3 10*3/uL — AB (ref 4.0–10.5)
WBC: 20 10*3/uL — ABNORMAL HIGH (ref 4.0–10.5)

## 2015-10-16 LAB — PREPARE PLATELET PHERESIS
UNIT DIVISION: 0
Unit division: 0

## 2015-10-16 LAB — PREPARE FRESH FROZEN PLASMA
UNIT DIVISION: 0
UNIT DIVISION: 0
Unit division: 0
Unit division: 0

## 2015-10-16 LAB — POCT I-STAT 3, ART BLOOD GAS (G3+)
ACID-BASE DEFICIT: 1 mmol/L (ref 0.0–2.0)
Acid-base deficit: 3 mmol/L — ABNORMAL HIGH (ref 0.0–2.0)
Bicarbonate: 21.6 mEq/L (ref 20.0–24.0)
Bicarbonate: 23.2 mEq/L (ref 20.0–24.0)
O2 Saturation: 96 %
O2 Saturation: 97 %
PCO2 ART: 35.5 mmHg (ref 35.0–45.0)
PCO2 ART: 36.2 mmHg (ref 35.0–45.0)
PH ART: 7.394 (ref 7.350–7.450)
PH ART: 7.415 (ref 7.350–7.450)
PO2 ART: 89 mmHg (ref 80.0–100.0)
Patient temperature: 37
TCO2: 23 mmol/L (ref 0–100)
TCO2: 24 mmol/L (ref 0–100)
pO2, Arterial: 81 mmHg (ref 80.0–100.0)

## 2015-10-16 LAB — BASIC METABOLIC PANEL
ANION GAP: 5 (ref 5–15)
BUN: 11 mg/dL (ref 6–20)
CALCIUM: 7.2 mg/dL — AB (ref 8.9–10.3)
CO2: 23 mmol/L (ref 22–32)
Chloride: 108 mmol/L (ref 101–111)
Creatinine, Ser: 0.71 mg/dL (ref 0.61–1.24)
GFR calc Af Amer: >60 mL/min (ref >60)
GLUCOSE: 156 mg/dL — AB (ref 65–99)
POTASSIUM: 3.5 mmol/L (ref 3.5–5.1)
SODIUM: 136 mmol/L (ref 135–145)

## 2015-10-16 LAB — CREATININE, SERUM
CREATININE: 0.8 mg/dL (ref 0.61–1.24)
GFR calc Af Amer: >60 mL/min (ref >60)

## 2015-10-16 LAB — PREPARE CRYOPRECIPITATE: Unit division: 0

## 2015-10-16 LAB — BLOOD PRODUCT ORDER (VERBAL) VERIFICATION

## 2015-10-16 MED ORDER — WARFARIN SODIUM 2.5 MG PO TABS
2.5000 mg | ORAL_TABLET | Freq: Every day | ORAL | Status: DC
  Administered 2015-10-16 – 2015-10-21 (×6): 2.5 mg via ORAL
  Filled 2015-10-16 (×6): qty 1

## 2015-10-16 MED ORDER — FUROSEMIDE 10 MG/ML IJ SOLN
10.0000 mg/h | INTRAVENOUS | Status: DC
  Administered 2015-10-16: 4 mg/h via INTRAVENOUS
  Administered 2015-10-17: 10 mg/h via INTRAVENOUS
  Filled 2015-10-16 (×3): qty 25

## 2015-10-16 MED ORDER — WARFARIN - PHYSICIAN DOSING INPATIENT
Freq: Every day | Status: DC
  Administered 2015-10-18 – 2015-10-22 (×5)

## 2015-10-16 MED ORDER — POTASSIUM CHLORIDE 10 MEQ/50ML IV SOLN
10.0000 meq | INTRAVENOUS | Status: AC
  Administered 2015-10-16 (×3): 10 meq via INTRAVENOUS
  Filled 2015-10-16 (×3): qty 50

## 2015-10-16 MED ORDER — POTASSIUM CHLORIDE 10 MEQ/50ML IV SOLN
10.0000 meq | INTRAVENOUS | Status: AC
  Administered 2015-10-16 (×3): 10 meq via INTRAVENOUS
  Filled 2015-10-16: qty 50

## 2015-10-16 MED ORDER — INSULIN DETEMIR 100 UNIT/ML ~~LOC~~ SOLN
28.0000 [IU] | Freq: Two times a day (BID) | SUBCUTANEOUS | Status: DC
  Administered 2015-10-16: 28 [IU] via SUBCUTANEOUS
  Filled 2015-10-16 (×3): qty 0.28

## 2015-10-16 MED ORDER — SODIUM CHLORIDE 0.9 % IV SOLN
INTRAVENOUS | Status: DC
  Administered 2015-10-16 – 2015-10-17 (×2): via INTRAVENOUS

## 2015-10-16 MED ORDER — INSULIN ASPART 100 UNIT/ML ~~LOC~~ SOLN
0.0000 [IU] | SUBCUTANEOUS | Status: DC
  Administered 2015-10-16: 4 [IU] via SUBCUTANEOUS
  Administered 2015-10-16: 2 [IU] via SUBCUTANEOUS
  Administered 2015-10-16: 4 [IU] via SUBCUTANEOUS
  Administered 2015-10-17: 2 [IU] via SUBCUTANEOUS
  Administered 2015-10-17 (×3): 4 [IU] via SUBCUTANEOUS
  Administered 2015-10-17: 2 [IU] via SUBCUTANEOUS

## 2015-10-16 MED ORDER — INSULIN DETEMIR 100 UNIT/ML ~~LOC~~ SOLN
28.0000 [IU] | Freq: Once | SUBCUTANEOUS | Status: AC
  Administered 2015-10-16: 28 [IU] via SUBCUTANEOUS
  Filled 2015-10-16: qty 0.28

## 2015-10-16 MED ORDER — MORPHINE SULFATE (PF) 2 MG/ML IV SOLN
1.0000 mg | INTRAVENOUS | Status: DC | PRN
  Administered 2015-10-16: 2 mg via INTRAVENOUS
  Filled 2015-10-16: qty 1

## 2015-10-16 NOTE — Care Management Important Message (Signed)
Important Message  Patient Details  Name: Christian Rasmussen MRN: [id number] Date of Birth: [birth year]   Medicare Important Message Given:  Yes    Nathen May 10/16/2015, 10:22 AM

## 2015-10-16 NOTE — Progress Notes (Addendum)
[address]       [place] [address]             [phone]        CARDIOTHORACIC SURGERY PROGRESS NOTE   R1 Day Post-Op Procedure(s) (LRB): CORONARY ARTERY BYPASS GRAFTING (CABG) X4 LIMA-LAD; SEQ SVG-PD-PL; SVG-OM1 ENDOSCOPIC GREATER SAPHENOUS VEIN HARVEST(EVH) BILAT LOWER EXTREM (N/A) TRANSESOPHAGEAL ECHOCARDIOGRAM (TEE) (N/A) MITRAL VALVE REPAIR (MVR), # 28 MEMO 3-D RING ANNULOPLASTY AND COMPLEX VALVE REPAIR (N/A)  Subjective: Looks good.  Just extubated early this morning.  Mild soreness in chest.  Objective: Vital signs: BP Readings from Last 1 Encounters:  10/16/15 119/52   Pulse Readings from Last 1 Encounters:  10/16/15 78   Resp Readings from Last 1 Encounters:  10/16/15 25   Temp Readings from Last 1 Encounters:  10/16/15 98.2 F (36.8 C)     Hemodynamics: PAP: (32-51)/(20-35) 32/20 mmHg CO:  [4.2 L/min-5.7 L/min] 4.6 L/min CI:  [1.9 L/min/m2-2.5 L/min/m2] 2.2 L/min/m2  Physical Exam:  Rhythm:   Junctional 50's - will not A-pace - DDD pacing  Breath sounds: clear  Heart sounds:  RRR  Incisions:  Dressings dry, intact  Abdomen:  Soft, non-distended, non-tender  Extremities:  Warm, well-perfused  Chest tubes:  Decreasing volume thin serosanguinous output, no air leak  JP drain:  95 mL out last shift    Intake/Output from previous day: 06/07 0701 - 06/08 0700 In: 9551.1 [I.V.:5644.1; Blood:2707; IV Piggyback:1200] Out: 66 [Urine:2965; Drains:95; Blood:800; Chest Tube:590] Intake/Output this shift:    Lab Results:  CBC: Recent Labs  10/15/15 1822 10/16/15 0314 10/16/15 0330  WBC 17.9*  --  18.3*  HGB 10.0* 8.5* 9.2*  HCT 30.4* 25.0* 27.5*  PLT 125*  --  155    BMET:  Recent Labs  10/15/15 0338  10/16/15 0314 10/16/15 0330  NA 138  < > 140 136  K 3.7  < > 3.6 3.5  CL 104  < > 103 108  CO2 27  --   --  23  GLUCOSE 147*  < > 158* 156*  BUN 17  < > 12 11  CREATININE 0.72  < > 0.70 0.71  CALCIUM 9.3  --    --  7.2*  < > = values in this interval not displayed.   PT/INR:   Recent Labs  10/15/15 1822  LABPROT 20.1*  INR 1.71*    CBG (last 3)   Recent Labs  10/16/15 0417 10/16/15 0510 10/16/15 0620  GLUCAP 145* 136* 129*    ABG    Component Value Date/Time   PHART 7.415 10/16/2015 0530   PCO2ART 36.2 10/16/2015 0530   PO2ART 89.0 10/16/2015 0530   HCO3 23.2 10/16/2015 0530   TCO2 24 10/16/2015 0530   ACIDBASEDEF 1.0 10/16/2015 0530   O2SAT 97.0 10/16/2015 0530    CXR: Mild bibasilar atelectasis and pulm vasc congestion  Assessment/Plan: S/P Procedure(s) (LRB): CORONARY ARTERY BYPASS GRAFTING (CABG) X4 LIMA-LAD; SEQ SVG-PD-PL; SVG-OM1 ENDOSCOPIC GREATER SAPHENOUS VEIN HARVEST(EVH) BILAT LOWER EXTREM (N/A) TRANSESOPHAGEAL ECHOCARDIOGRAM (TEE) (N/A) MITRAL VALVE REPAIR (MVR), # 28 MEMO 3-D RING ANNULOPLASTY AND COMPLEX VALVE REPAIR (N/A)  Overall doing well POD1 DDD paced rhythm w/ stable hemodynamics on very low dose milrinone, dopamine and levophed drips Breathing comfortably w/ O2 sats 92% on 4 L/min via [place] Expected post op acute blood loss anemia, Hgb stable 9.2 this morning Expected post op atelectasis, mild Expected post op volume excess, weight reportedly 30 lbs above  preop baseline Type II diabetes mellitus, excellent glycemic control Obesity w/ clinical findings suspicious for OSA   Hold all beta blockers and continue DDD pacing for now  Mobilize  Wean drips  Leave chest tubes and leg drain in for now  Diuresis  Start coumadin slowly  Add levemir and wean insulin drip  Rexene Alberts, MD 10/16/2015 7:43 AM

## 2015-10-16 NOTE — Psychosocial Assessment (Signed)
Rapid Wean Protocol started for the second time, RT and RN at bedside.

## 2015-10-16 NOTE — Progress Notes (Signed)
Placed patient back on full support following a failed wean attempt. Patient did well throughout CPAP wean but failed NIF and vital capacity parameters after several attempts and different coaching styles. Best out of all attempts was a NIF of -12 and a vital capacity of .54ml. ABG was good, but parameters were failed. RT will continue to monitor.

## 2015-10-16 NOTE — Progress Notes (Signed)
Pilot balloon on ETT was cut while this RT was cutting the pink tape off to replace with tube holder. Charge RT Gennaro Africa called to room and patient was extubated due to completing all parameters other than the NIF and VC. RT instructed patient on IS use and patient was able to draw in 346ml. RT at bedside continuing to monitor.

## 2015-10-16 NOTE — Progress Notes (Signed)
[facility] BRIEF SICU PROGRESS NOTE  1 Day Post-Op  S/P Procedure(s) (LRB): CORONARY ARTERY BYPASS GRAFTING (CABG) X4 LIMA-LAD; SEQ SVG-PD-PL; SVG-OM1 ENDOSCOPIC GREATER SAPHENOUS VEIN HARVEST(EVH) BILAT LOWER EXTREM (N/A) TRANSESOPHAGEAL ECHOCARDIOGRAM (TEE) (N/A) MITRAL VALVE REPAIR (MVR), # 28 MEMO 3-D RING ANNULOPLASTY AND COMPLEX VALVE REPAIR (N/A)   Stable day AV paced w/ stable BP, off all drips Breathing comfortably w/ O2 sats 97-100% on 3 L/min via [place] UOP adequate Labs okay w/ Hgb stable 9.2  Plan: Continue current plan  Rexene Alberts, MD 10/16/2015 6:12 PM

## 2015-10-16 NOTE — Procedures (Signed)
Extubation Procedure Note  Patient Details:   Name: Christian Rasmussen DOB: 06-03-1940 MRN: [id number]   Airway Documentation:  Airway 8 mm (Active)  Secured at (cm) 23 cm 10/16/2015  3:46 AM  Measured From Lips 10/16/2015  3:46 AM  [place] 10/16/2015  3:46 AM  Secured By [employer] 10/16/2015  3:46 AM  Tube Holder Repositioned Yes 10/16/2015  3:46 AM  Site Condition Edema 10/16/2015  3:46 AM    Evaluation  O2 sats: stable throughout Complications: No apparent complications Patient did tolerate procedure well. Bilateral Breath Sounds: Clear, Diminished   Yes  Jori Moll 10/16/2015, 4:30 AM   Patient demonstrated ability to speak and no stridor noted.

## 2015-10-17 ENCOUNTER — Inpatient Hospital Stay (HOSPITAL_COMMUNITY): Payer: Medicare Other

## 2015-10-17 LAB — GLUCOSE, CAPILLARY
GLUCOSE-CAPILLARY: 170 mg/dL — AB (ref 65–99)
GLUCOSE-CAPILLARY: 180 mg/dL — AB (ref 65–99)
GLUCOSE-CAPILLARY: 140 mg/dL — AB (ref 65–99)
Glucose-Capillary: 137 mg/dL — ABNORMAL HIGH (ref 65–99)
Glucose-Capillary: 165 mg/dL — ABNORMAL HIGH (ref 65–99)
Glucose-Capillary: 181 mg/dL — ABNORMAL HIGH (ref 65–99)

## 2015-10-17 LAB — CBC
HCT: 26.4 % — ABNORMAL LOW (ref 39.0–52.0)
HEMOGLOBIN: 8.9 g/dL — AB (ref 13.0–17.0)
MCH: 29.1 pg (ref 26.0–34.0)
MCHC: 33.7 g/dL (ref 30.0–36.0)
MCV: 86.3 fL (ref 78.0–100.0)
Platelets: 126 10*3/uL — ABNORMAL LOW (ref 150–400)
RBC: 3.06 MIL/uL — AB (ref 4.22–5.81)
RDW: 16.2 % — ABNORMAL HIGH (ref 11.5–15.5)
WBC: 18.8 10*3/uL — ABNORMAL HIGH (ref 4.0–10.5)

## 2015-10-17 LAB — BASIC METABOLIC PANEL
ANION GAP: 7 (ref 5–15)
BUN: 16 mg/dL (ref 6–20)
CALCIUM: 7.7 mg/dL — AB (ref 8.9–10.3)
CHLORIDE: 104 mmol/L (ref 101–111)
CO2: 25 mmol/L (ref 22–32)
CREATININE: 0.78 mg/dL (ref 0.61–1.24)
GFR calc non Af Amer: >60 mL/min (ref >60)
GLUCOSE: 173 mg/dL — AB (ref 65–99)
Potassium: 4 mmol/L (ref 3.5–5.1)
Sodium: 136 mmol/L (ref 135–145)

## 2015-10-17 LAB — PROTIME-INR
INR: 1.48 (ref 0.00–1.49)
Prothrombin Time: 18 seconds — ABNORMAL HIGH (ref 11.6–15.2)

## 2015-10-17 MED ORDER — SODIUM CHLORIDE 0.9% FLUSH: 3.0000 mL | INTRAVENOUS | Status: DC | PRN

## 2015-10-17 MED ORDER — INSULIN DETEMIR 100 UNIT/ML ~~LOC~~ SOLN
36.0000 [IU] | Freq: Two times a day (BID) | SUBCUTANEOUS | Status: DC
  Administered 2015-10-17 – 2015-10-18 (×4): 36 [IU] via SUBCUTANEOUS
  Filled 2015-10-17 (×6): qty 0.36

## 2015-10-17 MED ORDER — LISINOPRIL 20 MG PO TABS
20.0000 mg | ORAL_TABLET | Freq: Every day | ORAL | Status: DC
  Administered 2015-10-17 – 2015-10-19 (×3): 20 mg via ORAL
  Filled 2015-10-17 (×3): qty 1

## 2015-10-17 MED ORDER — POTASSIUM CHLORIDE CRYS ER 20 MEQ PO TBCR
20.0000 meq | EXTENDED_RELEASE_TABLET | Freq: Two times a day (BID) | ORAL | Status: DC
  Administered 2015-10-18 – 2015-10-19 (×4): 20 meq via ORAL
  Filled 2015-10-17 (×6): qty 1

## 2015-10-17 MED ORDER — SODIUM CHLORIDE 0.9% FLUSH
3.0000 mL | Freq: Two times a day (BID) | INTRAVENOUS | Status: DC
  Administered 2015-10-17 – 2015-10-23 (×7): 3 mL via INTRAVENOUS

## 2015-10-17 MED ORDER — SODIUM CHLORIDE 0.9 % IV SOLN: 250.0000 mL | INTRAVENOUS | Status: DC | PRN

## 2015-10-17 MED ORDER — MOVING RIGHT ALONG BOOK
Freq: Once | Status: AC
  Administered 2015-10-17: 09:00:00
  Filled 2015-10-17: qty 1

## 2015-10-17 MED ORDER — ENOXAPARIN SODIUM 40 MG/0.4ML ~~LOC~~ SOLN
40.0000 mg | SUBCUTANEOUS | Status: DC
  Administered 2015-10-18 – 2015-10-24 (×7): 40 mg via SUBCUTANEOUS
  Filled 2015-10-17 (×7): qty 0.4

## 2015-10-17 MED FILL — Lidocaine HCl IV Inj 20 MG/ML: INTRAVENOUS | Qty: 5 | Status: AC

## 2015-10-17 MED FILL — Sodium Chloride IV Soln 0.9%: INTRAVENOUS | Qty: 3000 | Status: AC

## 2015-10-17 MED FILL — Electrolyte-R (PH 7.4) Solution: INTRAVENOUS | Qty: 8000 | Status: AC

## 2015-10-17 MED FILL — Mannitol IV Soln 20%: INTRAVENOUS | Qty: 500 | Status: AC

## 2015-10-17 MED FILL — Heparin Sodium (Porcine) Inj 1000 Unit/ML: INTRAMUSCULAR | Qty: 10 | Status: AC

## 2015-10-17 MED FILL — Sodium Bicarbonate IV Soln 8.4%: INTRAVENOUS | Qty: 50 | Status: AC

## 2015-10-17 NOTE — Progress Notes (Addendum)
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  Mild soreness in chest.  No SOB  Objective: Vital signs: BP Readings from Last 1 Encounters:  10/17/15 146/73   Pulse Readings from Last 1 Encounters:  10/17/15 80   Resp Readings from Last 1 Encounters:  10/17/15 19   Temp Readings from Last 1 Encounters:  10/17/15 97.9 F (36.6 C) Oral    Hemodynamics: PAP: (35-39)/(23-25) 38/24 mmHg CO:  [3.9 L/min-4.5 L/min] 3.9 L/min CI:  [1.7 L/min/m2-2 L/min/m2] 1.7 L/min/m2  Physical Exam:  Rhythm:   DDD paced - profound bradycardia under pacer - Vpacing thresholds low  Breath sounds: clear  Heart sounds:  RRR w/out murmur  Incisions:  Dressing dry, intact  Abdomen:  Soft, non-distended, non-tender  Extremities:  Warm, well-perfused  Chest tubes:  Decreasing volume thin serosanguinous output, no air leak   Intake/Output from previous day: 06/08 0701 - 06/09 0700 In: 1084.5 [I.V.:762.5; IV Piggyback:250] Out: 2435 [Urine:1555; Drains:20; Chest Tube:860] Intake/Output this shift: Total I/O In: 48 [I.V.:48] Out: 5 [Drains:5]  Lab Results:  CBC: Recent Labs  10/16/15 1600 10/16/15 1604 10/17/15 0400  WBC 20.0*  --  18.8*  HGB 9.3* 9.2* 8.9*  HCT 27.6* 27.0* 26.4*  PLT 131*  --  126*    BMET:  Recent Labs  10/16/15 0330  10/16/15 1604 10/17/15 0400  NA 136  --  138 136  K 3.5  --  4.3 4.0  CL 108  --  103 104  CO2 23  --   --  25  GLUCOSE 156*  --  163* 173*  BUN 11  --  14 16  CREATININE 0.71  < > 0.70  0.78  CALCIUM 7.2*  --   --  7.7*  < > = values in this interval not displayed.   PT/INR:   Recent Labs  10/17/15 0400  LABPROT 18.0*  INR 1.48    CBG (last 3)   Recent Labs  10/16/15 1914 10/16/15 2354 10/17/15 0350  GLUCAP 156* 181* 170*    ABG    Component Value Date/Time   PHART 7.415 10/16/2015 0530   PCO2ART 36.2 10/16/2015 0530   PO2ART 89.0 10/16/2015 0530   HCO3 23.2 10/16/2015 0530   TCO2 20 10/16/2015 1604   ACIDBASEDEF 1.0 10/16/2015 0530   O2SAT 97.0 10/16/2015 0530    CXR: PORTABLE CHEST 1 VIEW  COMPARISON: 10/16/2015  FINDINGS: Right internal jugular approach Swan-Ganz catheter has been removed. There is remaining catheter sheath. Bilateral chest tubes, and mediastinal drain are in stable position. Sternotomy wires and changes from CABG and mitral valve prosthesis are stable.  The cardiac silhouette is mildly enlarged. Mediastinal contours appear intact.  There is no evidence of focal airspace consolidation, pleural effusion or pneumothorax. Mild pulmonary vascular congestion.  Osseous structures are without acute abnormality. Soft tissues are grossly normal.  IMPRESSION: Mild pulmonary vascular congestion.  Support apparatus as above. No evidence of pneumothorax.   Electronically Signed  By: Fidela Salisbury  M.D.  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; SEQ SVG-PD-PL; SVG-OM1 ENDOSCOPIC GREATER SAPHENOUS VEIN HARVEST(EVH) BILAT LOWER EXTREM (N/A) TRANSESOPHAGEAL ECHOCARDIOGRAM (TEE) (N/A) MITRAL VALVE REPAIR (MVR), # 28 MEMO 3-D RING ANNULOPLASTY AND COMPLEX VALVE REPAIR (N/A)  Overall doing well POD2 Remains pacer-dependent w/ profound bradycardia underneath, pre-op RBBB Stable BP off drips - BP trending up Breathing comfortably w/ O2 sats 95-97% on RA Expected post op acute blood loss anemia, Hgb stable 8.9 this morning Expected post op  atelectasis, mild Expected post op volume excess, I/O's negative 1.3 liters yesterday, weight down but still 17 lbs above preop baseline Type II diabetes mellitus, CBG's 156-170 Obesity w/ clinical findings suspicious for OSA Leukocytosis w/out fever, likely reactive, WBC trending down   Restart lisinopril for hypertension  Continue to hold all beta blockers  D/C mediastinal tubes but leave pleural tubes in place another day  D/C JP drain right thigh  Mobilize  Increase lasix drip  Increase levemir insulin  Keep in ICU until underlying escape rhythm improves  Consult EPS for permanent pacer if rhythm doesn't improve w/in 2-3 days  Continue low dose coumadin and add low dose lovenox  Rexene Alberts, MD 10/17/2015 8:53 AM

## 2015-10-17 NOTE — Progress Notes (Signed)
CT surgery p.m. Rounds  Patient remains stable after CABG-mitral valve repair however he remains bradycardic and pacemaker dependent. We'll continue to keep in ICU and monitor closely. P.m. labs reviewed and are stable

## 2015-10-18 ENCOUNTER — Inpatient Hospital Stay (HOSPITAL_COMMUNITY): Payer: Medicare Other

## 2015-10-18 LAB — GLUCOSE, CAPILLARY
GLUCOSE-CAPILLARY: 100 mg/dL — AB (ref 65–99)
GLUCOSE-CAPILLARY: 83 mg/dL (ref 65–99)
GLUCOSE-CAPILLARY: 110 mg/dL — AB (ref 65–99)
Glucose-Capillary: 109 mg/dL — ABNORMAL HIGH (ref 65–99)
Glucose-Capillary: 95 mg/dL (ref 65–99)
Glucose-Capillary: 99 mg/dL (ref 65–99)

## 2015-10-18 LAB — BASIC METABOLIC PANEL
Anion gap: 8 (ref 5–15)
BUN: 19 mg/dL (ref 6–20)
CO2: 29 mmol/L (ref 22–32)
Calcium: 8 mg/dL — ABNORMAL LOW (ref 8.9–10.3)
Chloride: 102 mmol/L (ref 101–111)
Creatinine, Ser: 0.65 mg/dL (ref 0.61–1.24)
GFR calc Af Amer: >60 mL/min (ref >60)
GFR calc non Af Amer: >60 mL/min (ref >60)
Glucose, Bld: 113 mg/dL — ABNORMAL HIGH (ref 65–99)
Potassium: 2.8 mmol/L — ABNORMAL LOW (ref 3.5–5.1)
Sodium: 139 mmol/L (ref 135–145)

## 2015-10-18 LAB — PROTIME-INR
INR: 1.33 (ref 0.00–1.49)
Prothrombin Time: 16.6 seconds — ABNORMAL HIGH (ref 11.6–15.2)

## 2015-10-18 LAB — CBC
HEMATOCRIT: 26.5 % — AB (ref 39.0–52.0)
Hemoglobin: 8.8 g/dL — ABNORMAL LOW (ref 13.0–17.0)
MCH: 29.4 pg (ref 26.0–34.0)
MCHC: 33.2 g/dL (ref 30.0–36.0)
MCV: 88.6 fL (ref 78.0–100.0)
PLATELETS: 145 10*3/uL — AB (ref 150–400)
RBC: 2.99 MIL/uL — AB (ref 4.22–5.81)
RDW: 15.9 % — ABNORMAL HIGH (ref 11.5–15.5)
WBC: 20.4 10*3/uL — AB (ref 4.0–10.5)

## 2015-10-18 LAB — POCT I-STAT, CHEM 8
BUN: 21 mg/dL — ABNORMAL HIGH (ref 6–20)
Calcium, Ion: 1.07 mmol/L — ABNORMAL LOW (ref 1.13–1.30)
Chloride: 98 mmol/L — ABNORMAL LOW (ref 101–111)
Creatinine, Ser: 0.7 mg/dL (ref 0.61–1.24)
Glucose, Bld: 114 mg/dL — ABNORMAL HIGH (ref 65–99)
HEMATOCRIT: 29 % — AB (ref 39.0–52.0)
HEMOGLOBIN: 9.9 g/dL — AB (ref 13.0–17.0)
Potassium: 3.6 mmol/L (ref 3.5–5.1)
SODIUM: 139 mmol/L (ref 135–145)
TCO2: 26 mmol/L (ref 0–100)

## 2015-10-18 MED ORDER — FUROSEMIDE 10 MG/ML IJ SOLN
20.0000 mg | Freq: Once | INTRAMUSCULAR | Status: AC
  Administered 2015-10-18: 20 mg via INTRAVENOUS
  Filled 2015-10-18: qty 2

## 2015-10-18 MED ORDER — POTASSIUM CHLORIDE 10 MEQ/50ML IV SOLN
10.0000 meq | INTRAVENOUS | Status: AC
  Administered 2015-10-18 (×3): 10 meq via INTRAVENOUS

## 2015-10-18 MED ORDER — CETYLPYRIDINIUM CHLORIDE 0.05 % MT LIQD
7.0000 mL | Freq: Two times a day (BID) | OROMUCOSAL | Status: DC
  Administered 2015-10-18 – 2015-10-24 (×10): 7 mL via OROMUCOSAL

## 2015-10-18 MED ORDER — FUROSEMIDE 10 MG/ML IJ SOLN: 40.0000 mg | Freq: Every day | INTRAMUSCULAR | Status: DC

## 2015-10-18 MED ORDER — POTASSIUM CHLORIDE 10 MEQ/50ML IV SOLN
10.0000 meq | INTRAVENOUS | Status: AC
  Administered 2015-10-18 (×2): 10 meq via INTRAVENOUS
  Filled 2015-10-18 (×2): qty 50

## 2015-10-18 MED ORDER — POTASSIUM CHLORIDE 10 MEQ/50ML IV SOLN
10.0000 meq | INTRAVENOUS | Status: AC
  Administered 2015-10-18 (×3): 10 meq via INTRAVENOUS
  Filled 2015-10-18: qty 50

## 2015-10-18 NOTE — Progress Notes (Signed)
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; SEQ SVG-PD-PL; SVG-OM1 ENDOSCOPIC GREATER SAPHENOUS VEIN HARVEST(EVH) BILAT LOWER EXTREM (N/A) TRANSESOPHAGEAL ECHOCARDIOGRAM (TEE) (N/A) MITRAL VALVE REPAIR (MVR), # 28 MEMO 3-D RING ANNULOPLASTY AND COMPLEX VALVE REPAIR (N/A) Subjective: Underlying HR now 50 off pacer diuresisng well Needs assist x 3 to get OOB -- PT consult placed Objective: Vital signs in last 24 hours: Temp:  [97.4 F (36.3 C)-98.5 F (36.9 C)] 97.8 F (36.6 C) (06/10 0400) Pulse Rate:  [80-86] 82 (06/10 1000) Cardiac Rhythm:  [-] A-V Sequential paced (06/10 0800) Resp:  [13-23] 14 (06/10 1000) BP: (119-161)/(63-88) 147/71 mmHg (06/10 1000) SpO2:  [94 %-100 %] 100 % (06/10 1000) Weight:  [254 lb 13.6 oz (115.6 kg)] 254 lb 13.6 oz (115.6 kg) (06/10 0600)  Hemodynamic parameters for last 24 hours:    Intake/Output from previous day: 06/09 0701 - 06/10 0700 In: 831.5 [I.V.:731.5; IV Piggyback:100] Out: 3230 [Urine:2885; Drains:5; Chest Tube:340] Intake/Output this shift: Total I/O In: 140 [I.V.:90; IV Piggyback:50] Out: 450 [Urine:400; Chest Tube:50]       Exam    General- alert and comfortable   Lungs- clear without rales, wheezes   Cor- regular rate and rhythm, no murmur , gallop   Abdomen- soft, non-tender   Extremities - warm, non-tender, minimal edema   Neuro- oriented, appropriate, no focal weakness   Lab Results:  Recent Labs  10/17/15 0400 10/18/15 0428  WBC 18.8* 20.4*  HGB 8.9* 8.8*  HCT 26.4* 26.5*  PLT 126* 145*   BMET:  Recent Labs  10/17/15 0400 10/18/15 0428  NA 136 139  K 4.0 2.8*  CL 104 102  CO2 25 29  GLUCOSE 173* 113*  BUN 16 19  CREATININE 0.78 0.65  CALCIUM 7.7* 8.0*    PT/INR:  Recent Labs  10/18/15 0428  LABPROT 16.6*  INR 1.33   ABG    Component Value Date/Time   PHART 7.415 10/16/2015 0530   HCO3 23.2 10/16/2015 0530   TCO2 20 10/16/2015 1604   ACIDBASEDEF  1.0 10/16/2015 0530   O2SAT 97.0 10/16/2015 0530   CBG (last 3)   Recent Labs  10/17/15 2329 10/18/15 0400 10/18/15 0842  GLUCAP 109* 95 100*    Assessment/Plan: S/P Procedure(s) (LRB): 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

## 2015-10-18 NOTE — Discharge Instructions (Addendum)
Information on my medicine - Coumadin   (Warfarin)  This medication education was reviewed with me or my healthcare representative as part of my discharge preparation.  The pharmacist that spoke with me during my hospital stay was:  Romona Curls, [facility]  Why was Coumadin prescribed for you? Coumadin was prescribed for you because you have a blood clot or a medical condition that can cause an increased risk of forming blood clots. Blood clots can cause serious health problems by blocking the flow of blood to the heart, lung, or brain. Coumadin can prevent harmful blood clots from forming. As a reminder your indication for Coumadin is:   Blood Clot Prevention After Heart Valve Surgery  What test will check on my response to Coumadin? While on Coumadin (warfarin) you will need to have an INR test regularly to ensure that your dose is keeping you in the desired range. The INR (international normalized ratio) number is calculated from the result of the laboratory test called prothrombin time (PT).  If an INR APPOINTMENT HAS NOT ALREADY BEEN MADE FOR YOU please schedule an appointment to have this lab work done by your health care provider within 7 days. Your INR goal is usually a number between:  2 to 3 or your provider may give you a more narrow range like 2-2.5.  Ask your health care provider during an office visit what your goal INR is.  What  do you need to  know  About  COUMADIN? Take Coumadin (warfarin) exactly as prescribed by your healthcare provider about the same time each day.  DO NOT stop taking without talking to the doctor who prescribed the medication.  Stopping without other blood clot prevention medication to take the place of Coumadin may increase your risk of developing a new clot or stroke.  Get refills before you run out.  What do you do if you miss a dose? If you miss a dose, take it as soon as you remember on the same day then continue your regularly scheduled regimen the next  day.  Do not take two doses of Coumadin at the same time.  Important Safety Information A possible side effect of Coumadin (Warfarin) is an increased risk of bleeding. You should call your healthcare provider right away if you experience any of the following: ? Bleeding from an injury or your nose that does not stop. ? Unusual colored urine (red or dark brown) or unusual colored stools (red or black). ? Unusual bruising for unknown reasons. ? A serious fall or if you hit your head (even if there is no bleeding).  Some foods or medicines interact with Coumadin (warfarin) and might alter your response to warfarin. To help avoid this: ? Eat a balanced diet, maintaining a consistent amount of Vitamin K. ? Notify your provider about major diet changes you plan to make. ? Avoid alcohol or limit your intake to 1 drink for women and 2 drinks for men per day. (1 drink is 5 oz. wine, 12 oz. beer, or 1.5 oz. liquor.)  Make sure that ANY health care provider who prescribes medication for you knows that you are taking Coumadin (warfarin).  Also make sure the healthcare provider who is monitoring your Coumadin knows when you have started a new medication including herbals and non-prescription products.  Coumadin (Warfarin)  Major Drug Interactions  Increased Warfarin Effect Decreased Warfarin Effect  Alcohol (large quantities) Antibiotics (esp. Septra/Bactrim, Flagyl, Cipro) Amiodarone (Cordarone) Aspirin (ASA) Cimetidine (Tagamet) Megestrol (Megace) NSAIDs (  ibuprofen, naproxen, etc.) Piroxicam (Feldene) Propafenone (Rythmol SR) Propranolol (Inderal) Isoniazid (INH) Posaconazole (Noxafil) Barbiturates (Phenobarbital) Carbamazepine (Tegretol) Chlordiazepoxide (Librium) Cholestyramine (Questran) Griseofulvin Oral Contraceptives Rifampin Sucralfate (Carafate) Vitamin K   Coumadin (Warfarin) Major Herbal Interactions  Increased Warfarin Effect Decreased Warfarin Effect   Garlic Ginseng Ginkgo biloba Coenzyme Q10 Green tea St. Johns wort    Coumadin (Warfarin) FOOD Interactions  Eat a consistent number of servings per week of foods HIGH in Vitamin K (1 serving =  cup)  Collards (cooked, or boiled & drained) Kale (cooked, or boiled & drained) Mustard greens (cooked, or boiled & drained) Parsley *serving size only =  cup Spinach (cooked, or boiled & drained) Swiss chard (cooked, or boiled & drained) Turnip greens (cooked, or boiled & drained)  Eat a consistent number of servings per week of foods MEDIUM-HIGH in Vitamin K (1 serving = 1 cup)  Asparagus (cooked, or boiled & drained) Broccoli (cooked, boiled & drained, or raw & chopped) Brussel sprouts (cooked, or boiled & drained) *serving size only =  cup Lettuce, raw (green leaf, endive, romaine) Spinach, raw Turnip greens, raw & chopped   These websites have more information on Coumadin (warfarin):  FailFactory.se; VeganReport.com.au;      Supplemental Discharge Instructions for  Pacemaker/Defibrillator Patients  Activity No heavy lifting or vigorous activity with your left/right arm for 6 to 8 weeks.  Do not raise your left/right arm above your head for one week.  Gradually raise your affected arm as drawn below.              10/24/15                    10/25/15                     10/26/15                  10/27/15 __  NO DRIVING for  1 week   ; you may begin driving on   [id number]  . (unless instructed for a longer duration from surgery/surgeon  [place] the wound area clean and dry.  Do not get this area wet for one week. No showers for one week; you may shower on 10/1915    .(unless instructed for longer duration from surgery/surgeon) - The tape/steri-strips on your wound will fall off; do not pull them off.  No bandage is needed on the site.  DO  NOT apply any creams, oils, or ointments to the wound area. - If you notice any drainage or discharge from the  wound, any swelling or bruising at the site, or you develop a fever > 101? F after you are discharged home, call the office at once.  Special Instructions - You are still able to use cellular telephones; use the ear opposite the side where you have your pacemaker/defibrillator.  Avoid carrying your cellular phone near your device. - When traveling through airports, show security personnel your identification card to avoid being screened in the metal detectors.  Ask the security personnel to use the hand wand. - Avoid arc welding equipment, MRI testing (magnetic resonance imaging), TENS units (transcutaneous nerve stimulators).  Call the office for questions about other devices. - Avoid electrical appliances that are in poor condition or are not properly grounded. - Microwave ovens are safe to be near or to operate.  Additional information for defibrillator patients should your device go off: - If your device goes off  ONCE and you feel fine afterward, notify the device clinic nurses. - If your device goes off ONCE and you do not feel well afterward, call 911. - If your device goes off TWICE, call 911. - If your device goes off THREE times in one day, call 911.  DO NOT DRIVE YOURSELF OR A FAMILY MEMBER WITH A DEFIBRILLATOR TO THE HOSPITAL--CALL 911.  Information on my medicine - Coumadin   (Warfarin)  This medication education was reviewed with me or my healthcare representative as part of my discharge preparation.  The pharmacist that spoke with me during my hospital stay was:  Deboraha Sprang, [facility]  Why was Coumadin prescribed for you? Coumadin was prescribed for you because you have a blood clot or a medical condition that can cause an increased risk of forming blood clots. Blood clots can cause serious health problems by blocking the flow of blood to the heart, lung, or brain. Coumadin can prevent harmful blood clots from forming. As a reminder your indication for Coumadin is:   Blood  Clot Prevention After Heart Valve Surgery  What test will check on my response to Coumadin? While on Coumadin (warfarin) you will need to have an INR test regularly to ensure that your dose is keeping you in the desired range. The INR (international normalized ratio) number is calculated from the result of the laboratory test called prothrombin time (PT).  If an INR APPOINTMENT HAS NOT ALREADY BEEN MADE FOR YOU please schedule an appointment to have this lab work done by your health care provider within 7 days. Your INR goal is usually a number between:  2 to 3 or your provider may give you a more narrow range like 2-2.5.  Ask your health care provider during an office visit what your goal INR is.  What  do you need to  know  About  COUMADIN? Take Coumadin (warfarin) exactly as prescribed by your healthcare provider about the same time each day.  DO NOT stop taking without talking to the doctor who prescribed the medication.  Stopping without other blood clot prevention medication to take the place of Coumadin may increase your risk of developing a new clot or stroke.  Get refills before you run out.  What do you do if you miss a dose? If you miss a dose, take it as soon as you remember on the same day then continue your regularly scheduled regimen the next day.  Do not take two doses of Coumadin at the same time.  Important Safety Information A possible side effect of Coumadin (Warfarin) is an increased risk of bleeding. You should call your healthcare provider right away if you experience any of the following: ? Bleeding from an injury or your nose that does not stop. ? Unusual colored urine (red or dark brown) or unusual colored stools (red or black). ? Unusual bruising for unknown reasons. ? A serious fall or if you hit your head (even if there is no bleeding).  Some foods or medicines interact with Coumadin (warfarin) and might alter your response to warfarin. To help avoid this: ? Eat a  balanced diet, maintaining a consistent amount of Vitamin K. ? Notify your provider about major diet changes you plan to make. ? Avoid alcohol or limit your intake to 1 drink for women and 2 drinks for men per day. (1 drink is 5 oz. wine, 12 oz. beer, or 1.5 oz. liquor.)  Make sure that ANY health care provider who prescribes  medication for you knows that you are taking Coumadin (warfarin).  Also make sure the healthcare provider who is monitoring your Coumadin knows when you have started a new medication including herbals and non-prescription products.  Coumadin (Warfarin)  Major Drug Interactions  Increased Warfarin Effect Decreased Warfarin Effect  Alcohol (large quantities) Antibiotics (esp. Septra/Bactrim, Flagyl, Cipro) Amiodarone (Cordarone) Aspirin (ASA) Cimetidine (Tagamet) Megestrol (Megace) NSAIDs (ibuprofen, naproxen, etc.) Piroxicam (Feldene) Propafenone (Rythmol SR) Propranolol (Inderal) Isoniazid (INH) Posaconazole (Noxafil) Barbiturates (Phenobarbital) Carbamazepine (Tegretol) Chlordiazepoxide (Librium) Cholestyramine (Questran) Griseofulvin Oral Contraceptives Rifampin Sucralfate (Carafate) Vitamin K   Coumadin (Warfarin) Major Herbal Interactions  Increased Warfarin Effect Decreased Warfarin Effect  Garlic Ginseng Ginkgo biloba Coenzyme Q10 Green tea St. Johns wort    Coumadin (Warfarin) FOOD Interactions  Eat a consistent number of servings per week of foods HIGH in Vitamin K (1 serving =  cup)  Collards (cooked, or boiled & drained) Kale (cooked, or boiled & drained) Mustard greens (cooked, or boiled & drained) Parsley *serving size only =  cup Spinach (cooked, or boiled & drained) Swiss chard (cooked, or boiled & drained) Turnip greens (cooked, or boiled & drained)  Eat a consistent number of servings per week of foods MEDIUM-HIGH in Vitamin K (1 serving = 1 cup)  Mitral Valve Replacement, Care After Refer to this sheet in the next few  weeks. These instructions provide you with information on caring for yourself after your procedure. Your health care provider may also give you specific instructions. Your treatment has been planned according to current medical practices, but problems sometimes occur. Call your health care provider if you have any problems or questions after your procedure.  HOME CARE INSTRUCTIONS   Take medicines only as directed by your health care provider.  Take your temperature every morning for the first 7 days after surgery. Write these down.  Weigh yourself every morning for at least 7 days after surgery. Write your weight down.  Wear elastic stockings during the day for at least 2 weeks after surgery or as directed by your health care provider. Use them longer if your ankles are swollen. The stockings help blood flow and help reduce swelling in the legs.  Take frequent naps or rest often throughout the day.  Avoid lifting more than 10 lb (4.5 kg) or pushing or pulling things with your arms for 6-8 weeks or as directed by your health care provider.  Avoid driving or airplane travel for 4-6 weeks after surgery or as directed. If you are riding in a car for an extended period, stop every 1-2 hours to stretch your legs.  Avoid crossing your legs.  Avoid climbing stairs and using the handrail to pull yourself up for the first 2-3 weeks after surgery.  Do not take baths for 2-4 weeks after surgery. Take showers once your health care provider approves. Pat incisions dry. Do not rub incisions with a washcloth or towel.  Return to work as directed by your health care provider.  Drink enough fluids to keep your urine clear or pale yellow.  Do not strain to have a bowel movement. Eat high-fiber foods if you become constipated. You may also take a medicine to help you have a bowel movement (laxative) as directed by your health care provider.  Resume sexual activity as directed by your health care  provider. SEEK MEDICAL CARE IF:   You develop a skin rash.   Your weight is increasing each day over 2-3 days.  Your weight increases by 2  or more pounds (1 kg) in a single day.  You have a fever. SEEK IMMEDIATE MEDICAL CARE IF:   You develop chest pain that is not coming from your incision.  You develop shortness of breath or difficulty breathing.  You have drainage, redness, swelling, or pain at your incision site.  You have pus coming from your incision.  You develop light-headedness. MAKE SURE YOU:  Understand these directions.  Will watch your condition.  Will get help right away if you are not doing well or get worse.   This information is not intended to replace advice given to you by your health care provider. Make sure you discuss any questions you have with your health care provider.   Document Released: 11/13/2004 Document Revised: 05/17/2014 Document Reviewed: 09/26/2012 Elsevier Interactive Patient Education 2016 Elsevier [employer]. Coronary Artery Bypass Grafting, Care After These instructions give you information on caring for yourself after your procedure. Your doctor may also give you more specific instructions. Call your doctor if you have any problems or questions after your procedure.  HOME CARE  Only take medicine as told by your doctor. Take medicines exactly as told. Do not stop taking medicines or start any new medicines without talking to your doctor first.  Take your pulse as told by your doctor.  Do deep breathing as told by your doctor. Use your breathing device (incentive spirometer), if given, to practice deep breathing several times a day. Support your chest with a pillow or your arms when you take deep breaths or cough.  Keep the area clean, dry, and protected where the surgery cuts (incisions) were made. Remove bandages (dressings) only as told by your doctor. If strips were applied to surgical area, do not take them off. They fall off on their  own.  Check the surgery area daily for puffiness (swelling), redness, or leaking fluid.  If surgery cuts were made in your legs:  Avoid crossing your legs.  Avoid sitting for long periods of time. Change positions every 30 minutes.  Raise your legs when you are sitting. Place them on pillows.  Wear stockings that help keep blood clots from forming in your legs (compression stockings).  Only take sponge baths until your doctor says it is okay to take showers. Pat the surgery area dry. Do not rub the surgery area with a washcloth or towel. Do not bathe, swim, or use a hot tub until your doctor says it is okay.  Eat foods that are high in fiber. These include raw fruits and vegetables, whole grains, beans, and nuts. Choose lean meats. Avoid canned, processed, and fried foods.  Drink enough fluids to keep your pee (urine) clear or pale yellow.  Weigh yourself every day.  Rest and limit activity as told by your doctor. You may be told to:  Stop any activity if you have chest pain, shortness of breath, changes in heartbeat, or dizziness. Get help right away if this happens.  Move around often for short amounts of time or take short walks as told by your doctor. Gradually become more active. You may need help to strengthen your muscles and build endurance.  Avoid lifting, pushing, or pulling anything heavier than 10 pounds (4.5 kg) for at least 6 weeks after surgery.  Do not drive until your doctor says it is okay.  Ask your doctor when you can go back to work.  Ask your doctor when you can begin sexual activity again.  Follow up with your doctor as told.  GET HELP IF:  You have puffiness, redness, more pain, or fluid draining from the incision site.  You have a fever.  You have puffiness in your ankles or legs.  You have pain in your legs.  You gain 2 or more pounds (0.9 kg) a day.  You feel sick to your stomach (nauseous) or throw up (vomit).  You have watery poop  (diarrhea). GET HELP RIGHT AWAY IF:  You have chest pain that goes to your jaw or arms.  You have shortness of breath.  You have a fast or irregular heartbeat.  You notice a "clicking" in your breastbone when you move.  You have numbness or weakness in your arms or legs.  You feel dizzy or light-headed. MAKE SURE YOU:  Understand these instructions.  Will watch your condition.  Will get help right away if you are not doing well or get worse.   This information is not intended to replace advice given to you by your health care provider. Make sure you discuss any questions you have with your health care provider.   Document Released: 05/01/2013 Document Reviewed: 05/01/2013 Elsevier Interactive Patient Education [employer].    These websites have more information on Coumadin (warfarin):  FailFactory.se; VeganReport.com.au;

## 2015-10-18 NOTE — Progress Notes (Signed)
CT surgery p.m. Rounds  Patient name related hallway 200 feet Remains paced at 80 bpm Continue current care

## 2015-10-19 ENCOUNTER — Inpatient Hospital Stay (HOSPITAL_COMMUNITY): Payer: Medicare Other

## 2015-10-19 LAB — BASIC METABOLIC PANEL
Anion gap: 5 (ref 5–15)
BUN: 21 mg/dL — ABNORMAL HIGH (ref 6–20)
CO2: 29 mmol/L (ref 22–32)
Calcium: 8.1 mg/dL — ABNORMAL LOW (ref 8.9–10.3)
Chloride: 104 mmol/L (ref 101–111)
Creatinine, Ser: 0.63 mg/dL (ref 0.61–1.24)
GFR calc Af Amer: >60 mL/min (ref >60)
GFR calc non Af Amer: >60 mL/min (ref >60)
Glucose, Bld: 97 mg/dL (ref 65–99)
Potassium: 3.2 mmol/L — ABNORMAL LOW (ref 3.5–5.1)
Sodium: 138 mmol/L (ref 135–145)

## 2015-10-19 LAB — GLUCOSE, CAPILLARY
Glucose-Capillary: 105 mg/dL — ABNORMAL HIGH (ref 65–99)
Glucose-Capillary: 127 mg/dL — ABNORMAL HIGH (ref 65–99)
Glucose-Capillary: 134 mg/dL — ABNORMAL HIGH (ref 65–99)
Glucose-Capillary: 151 mg/dL — ABNORMAL HIGH (ref 65–99)
Glucose-Capillary: 66 mg/dL (ref 65–99)
Glucose-Capillary: 72 mg/dL (ref 65–99)
Glucose-Capillary: 72 mg/dL (ref 65–99)
Glucose-Capillary: 86 mg/dL (ref 65–99)

## 2015-10-19 LAB — CBC
HCT: 24.1 % — ABNORMAL LOW (ref 39.0–52.0)
Hemoglobin: 8 g/dL — ABNORMAL LOW (ref 13.0–17.0)
MCH: 29.2 pg (ref 26.0–34.0)
MCHC: 33.2 g/dL (ref 30.0–36.0)
MCV: 88 fL (ref 78.0–100.0)
Platelets: 182 10*3/uL (ref 150–400)
RBC: 2.74 MIL/uL — ABNORMAL LOW (ref 4.22–5.81)
RDW: 15.9 % — ABNORMAL HIGH (ref 11.5–15.5)
WBC: 14 10*3/uL — ABNORMAL HIGH (ref 4.0–10.5)

## 2015-10-19 LAB — PROTIME-INR
INR: 1.45 (ref 0.00–1.49)
Prothrombin Time: 17.7 seconds — ABNORMAL HIGH (ref 11.6–15.2)

## 2015-10-19 MED ORDER — SORBITOL 70 % PO SOLN
60.0000 mL | Freq: Every day | ORAL | Status: AC | PRN
Start: 2015-10-19 — End: 2015-10-19
  Administered 2015-10-19: 60 mL via ORAL
  Filled 2015-10-19: qty 60

## 2015-10-19 MED ORDER — INSULIN ASPART 100 UNIT/ML ~~LOC~~ SOLN
0.0000 [IU] | Freq: Three times a day (TID) | SUBCUTANEOUS | Status: DC
  Administered 2015-10-19 – 2015-10-21 (×4): 2 [IU] via SUBCUTANEOUS
  Administered 2015-10-22 – 2015-10-23 (×3): 3 [IU] via SUBCUTANEOUS
  Administered 2015-10-23 – 2015-10-24 (×2): 2 [IU] via SUBCUTANEOUS

## 2015-10-19 MED ORDER — AMLODIPINE BESYLATE 5 MG PO TABS
5.0000 mg | ORAL_TABLET | Freq: Every day | ORAL | Status: DC
  Administered 2015-10-19: 5 mg via ORAL
  Filled 2015-10-19: qty 1

## 2015-10-19 MED ORDER — FUROSEMIDE 10 MG/ML IJ SOLN
40.0000 mg | Freq: Two times a day (BID) | INTRAMUSCULAR | Status: DC
  Administered 2015-10-19 – 2015-10-20 (×3): 40 mg via INTRAVENOUS
  Filled 2015-10-19 (×3): qty 4

## 2015-10-19 MED ORDER — INSULIN DETEMIR 100 UNIT/ML ~~LOC~~ SOLN
30.0000 [IU] | Freq: Two times a day (BID) | SUBCUTANEOUS | Status: DC
  Administered 2015-10-19 (×2): 30 [IU] via SUBCUTANEOUS
  Filled 2015-10-19 (×4): qty 0.3

## 2015-10-19 MED ORDER — POTASSIUM CHLORIDE 10 MEQ/50ML IV SOLN
10.0000 meq | INTRAVENOUS | Status: AC
  Administered 2015-10-19 (×3): 10 meq via INTRAVENOUS
  Filled 2015-10-19 (×2): qty 50

## 2015-10-19 MED ORDER — LISINOPRIL 10 MG PO TABS: 5.0000 mg | ORAL_TABLET | Freq: Every day | ORAL | Status: DC

## 2015-10-19 MED ORDER — INSULIN ASPART 100 UNIT/ML ~~LOC~~ SOLN: 0.0000 [IU] | Freq: Every day | SUBCUTANEOUS | Status: DC

## 2015-10-19 MED ORDER — FE FUMARATE-B12-VIT C-FA-IFC PO CAPS
1.0000 | ORAL_CAPSULE | Freq: Two times a day (BID) | ORAL | Status: DC
  Administered 2015-10-19 – 2015-10-24 (×10): 1 via ORAL
  Filled 2015-10-19 (×10): qty 1

## 2015-10-19 NOTE — Progress Notes (Signed)
4 Days Post-Op Procedure(s) (LRB): CORONARY ARTERY BYPASS GRAFTING (CABG) X4 LIMA-LAD; SEQ SVG-PD-PL; SVG-OM1 ENDOSCOPIC GREATER SAPHENOUS VEIN HARVEST(EVH) BILAT LOWER EXTREM (N/A) TRANSESOPHAGEAL ECHOCARDIOGRAM (TEE) (N/A) MITRAL VALVE REPAIR (MVR), # 28 MEMO 3-D RING ANNULOPLASTY AND COMPLEX VALVE REPAIR (N/A) Subjective: Native HR 50/min junctional CBGs well controlled IV lasix diuresis Postop blood loss anemia Ambulating well No BM  Objective: Vital signs in last 24 hours: Temp:  [97.8 F (36.6 C)-98.3 F (36.8 C)] 98.3 F (36.8 C) (06/11 0900) Pulse Rate:  [80-89] 85 (06/11 0900) Cardiac Rhythm:  [-] A-V Sequential paced (06/11 0800) Resp:  [13-30] 20 (06/11 0900) BP: (96-158)/(57-85) 158/73 mmHg (06/11 0900) SpO2:  [92 %-100 %] 99 % (06/11 0900) Weight:  [257 lb 8 oz (116.8 kg)] 257 lb 8 oz (116.8 kg) (06/11 0700)  Hemodynamic parameters for last 24 hours:    Intake/Output from previous day: 06/10 0701 - 06/11 0700 In: 1000 [P.O.:180; I.V.:520; IV Piggyback:300] Out: 1725 [Urine:1665; Chest Tube:60] Intake/Output this shift: Total I/O In: 90 [I.V.:40; IV Piggyback:50] Out: 60 [Urine:60]       Exam    General- alert and comfortable   Lungs- clear without rales, wheezes   Cor- regular rate and rhythm, no murmur , gallop   Abdomen- soft, non-tender   Extremities - warm, non-tender, minimal edema   Neuro- oriented, appropriate, no focal weakness   Lab Results:  Recent Labs  10/18/15 0428 10/18/15 1645 10/19/15 0500  WBC 20.4*  --  14.0*  HGB 8.8* 9.9* 8.0*  HCT 26.5* 29.0* 24.1*  PLT 145*  --  182   BMET:  Recent Labs  10/18/15 0428 10/18/15 1645 10/19/15 0500  NA 139 139 138  K 2.8* 3.6 3.2*  CL 102 98* 104  CO2 29  --  29  GLUCOSE 113* 114* 97  BUN 19 21* 21*  CREATININE 0.65 0.70 0.63  CALCIUM 8.0*  --  8.1*    PT/INR:  Recent Labs  10/19/15 0500  LABPROT 17.7*  INR 1.45   ABG    Component Value Date/Time   PHART 7.415  10/16/2015 0530   HCO3 23.2 10/16/2015 0530   TCO2 26 10/18/2015 1645   ACIDBASEDEF 1.0 10/16/2015 0530   O2SAT 97.0 10/16/2015 0530   CBG (last 3)   Recent Labs  10/19/15 0022 10/19/15 0347 10/19/15 0901  GLUCAP 86 105* 66    Assessment/Plan: S/P Procedure(s) (LRB): CORONARY ARTERY BYPASS GRAFTING (CABG) X4 LIMA-LAD; SEQ SVG-PD-PL; SVG-OM1 ENDOSCOPIC GREATER SAPHENOUS VEIN HARVEST(EVH) BILAT LOWER EXTREM (N/A) TRANSESOPHAGEAL ECHOCARDIOGRAM (TEE) (N/A) MITRAL VALVE REPAIR (MVR), # 28 MEMO 3-D RING ANNULOPLASTY AND COMPLEX VALVE REPAIR (N/A) Mobilize Diuresis Diabetes control Plan for transfer to step-down: see transfer orders cont coumadin - inr 1.5   LOS: 7 days    Christian Rasmussen 10/19/2015

## 2015-10-19 NOTE — Evaluation (Signed)
Physical Therapy Evaluation Patient Details Name: Christian Rasmussen MRN: [id number] DOB: 1940-09-01 Today's Date: 10/19/2015   History of Present Illness  Christian Rasmussen is a 75 y.o. male with past medical history of CAD (s/p PCI to RCA in 2004), atrial flutter (s/p ablation 2014), HTN, Type 2 DM, and HLD who presented to [facility] on 10/12/2015 for evaluation of chest pain.  Pt underwent CABG x4 and MVR on 10/15/15.    Clinical Impression  Pt admitted with above diagnosis. Pt currently with functional limitations due to the deficits listed below (see PT Problem List). Pt was able to ambulate pushing wheelchair but needed mod assist of 2 people with wife following with chair.  Feel pt is a good rehab candidate as he has suppportive family.   Pt will benefit from skilled PT to increase their independence and safety with mobility to allow discharge to the venue listed below.      Follow Up Recommendations CIR;Supervision/Assistance - 24 hour    Equipment Recommendations  Rolling walker with 5" wheels    Recommendations for Other Services Rehab consult     Precautions / Restrictions Precautions Precautions: Fall;Sternal Restrictions Weight Bearing Restrictions: Yes (sternal precautions)      Mobility  Bed Mobility               General bed mobility comments: pt in chair on arrival  Transfers Overall transfer level: Needs assistance Equipment used: Pushed w/c Transfers: Sit to/from Stand Sit to Stand: Mod assist;+2 physical assistance         General transfer comment: Incr time and difficulty for pt to perform sit to stand needing cues to hold pillow and not use UES as well as use of pad to power up as pt very weak in legs.  Pt did seeem to do better with each stand attempt.    Ambulation/Gait Ambulation/Gait assistance: Mod assist;+2 physical assistance;+2 safety/equipment Ambulation Distance (Feet): 184 Feet (84, seated rest break and then 100 feet) Assistive device:   (pushed wheelchair) Gait Pattern/deviations: Step-through pattern;Decreased stride length;Antalgic;Trunk flexed;Wide base of support;Drifts right/left   Gait velocity interpretation: <1.8 ft/sec, indicative of risk for recurrent falls General Gait Details: Pt ambulated pushing wheelchair with assist to control wheelchair as pt pushing too far in front unless controlled.  Pt with anterior lean as well that was too heavy at times.  Pt needed max cues for safety as balance was impaired at times when he would fatigue.  Needed 1 seated rest break.  Wife followed pt with chair while 2 persons guarded and cued pt.   Stairs            Wheelchair Mobility    Modified Rankin (Stroke Patients Only)       Balance Overall balance assessment: Needs assistance;History of Falls Sitting-balance support: No upper extremity supported;Feet supported Sitting balance-Leahy Scale: Good     Standing balance support: Bilateral upper extremity supported;During functional activity Standing balance-Leahy Scale: Poor Standing balance comment: relies too much on his UEs with static standing needed mod assist and constant cues.                              Pertinent Vitals/Pain Pain Assessment: Faces Faces Pain Scale: Hurts little more Pain Location: incision Pain Descriptors / Indicators: Grimacing;Guarding;Operative site guarding Pain Intervention(s): Limited activity within patient's tolerance;Monitored during session;Repositioned  95% RA, other VSS    Home Living Family/patient expects to be discharged  to:: Private residence Living Arrangements: Spouse/significant other Available Help at Discharge: Family;Available 24 hours/day Type of Home: House Home Access: Stairs to enter Entrance Stairs-Rails: Right Entrance Stairs-Number of Steps: 4 Home Layout: One level Home Equipment: Bedside commode;Wheelchair - manual;Tub bench      Prior Function Level of Independence: Independent                Hand Dominance        Extremity/Trunk Assessment   Upper Extremity Assessment: Defer to OT evaluation           Lower Extremity Assessment: Generalized weakness      Cervical / Trunk Assessment: Kyphotic  Communication   Communication: No difficulties  Cognition Arousal/Alertness: Awake/alert Behavior During Therapy: Anxious Overall Cognitive Status: Within Functional Limits for tasks assessed                      General Comments General comments (skin integrity, edema, etc.): bil hands edema making gripping of wheelchair difficult.    Exercises        Assessment/Plan    PT Assessment Patient needs continued PT services  PT Diagnosis Generalized weakness   PT Problem List Decreased activity tolerance;Decreased balance;Decreased mobility;Decreased knowledge of use of DME;Decreased safety awareness;Decreased knowledge of precautions;Cardiopulmonary status limiting activity  PT Treatment Interventions DME instruction;Gait training;Functional mobility training;Therapeutic activities;Therapeutic exercise;Balance training;Patient/family education;Stair training   PT Goals (Current goals can be found in the Care Plan section) Acute Rehab PT Goals Patient Stated Goal: to go home PT Goal Formulation: With patient Time For Goal Achievement: 11/02/15 Potential to Achieve Goals: Good    Frequency Min 3X/week   Barriers to discharge        Co-evaluation               End of Session Equipment Utilized During Treatment: Gait belt;Oxygen Activity Tolerance: Patient limited by fatigue Patient left: in chair;with call bell/phone within reach;with chair alarm set;with family/visitor present Nurse Communication: Mobility status         Time: 1202-1226 PT Time Calculation (min) (ACUTE ONLY): 24 min   Charges:   PT Evaluation $PT Eval Moderate Complexity: 1 Procedure PT Treatments $Gait Training: 8-22 mins   PT G CodesDenice Paradise 10/25/15, 2:18 PM Allisha Harter [facility] [phone] (pager)

## 2015-10-20 ENCOUNTER — Encounter (HOSPITAL_COMMUNITY)
Admission: EM | Disposition: A | Discharge status: Home Health | Payer: Self-pay | Source: Home / Self Care | Attending: Thoracic Surgery (Cardiothoracic Vascular Surgery)

## 2015-10-20 DIAGNOSIS — I442 Atrioventricular block, complete: Secondary | ICD-10-CM

## 2015-10-20 HISTORY — PX: EP IMPLANTABLE DEVICE: SHX172B

## 2015-10-20 LAB — VAS US DOPPLER PRE CABG
LCCADDIAS: -13 cm/s
LCCADSYS: -84 cm/s
LCCAPDIAS: 12 cm/s
LEFT ECA DIAS: -5 cm/s
LEFT VERTEBRAL DIAS: -10 cm/s
LICADDIAS: -29 cm/s
LICAPDIAS: -28 cm/s
LICAPSYS: -90 cm/s
Left CCA prox sys: 96 cm/s
Left ICA dist sys: -87 cm/s
RCCAPDIAS: 9 cm/s
RCCAPSYS: 88 cm/s
RIGHT ECA DIAS: -8 cm/s
RIGHT VERTEBRAL DIAS: -6 cm/s
Right cca dist sys: -55 cm/s

## 2015-10-20 LAB — CBC
HCT: 25.7 % — ABNORMAL LOW (ref 39.0–52.0)
Hemoglobin: 8.2 g/dL — ABNORMAL LOW (ref 13.0–17.0)
MCH: 28.3 pg (ref 26.0–34.0)
MCHC: 31.9 g/dL (ref 30.0–36.0)
MCV: 88.6 fL (ref 78.0–100.0)
Platelets: 214 10*3/uL (ref 150–400)
RBC: 2.9 MIL/uL — ABNORMAL LOW (ref 4.22–5.81)
RDW: 15.7 % — ABNORMAL HIGH (ref 11.5–15.5)
WBC: 13 10*3/uL — ABNORMAL HIGH (ref 4.0–10.5)

## 2015-10-20 LAB — GLUCOSE, CAPILLARY
GLUCOSE-CAPILLARY: 79 mg/dL (ref 65–99)
GLUCOSE-CAPILLARY: 155 mg/dL — AB (ref 65–99)
GLUCOSE-CAPILLARY: 56 mg/dL — AB (ref 65–99)
Glucose-Capillary: 86 mg/dL (ref 65–99)
Glucose-Capillary: 188 mg/dL — ABNORMAL HIGH (ref 65–99)

## 2015-10-20 LAB — BASIC METABOLIC PANEL
Anion gap: 8 (ref 5–15)
BUN: 19 mg/dL (ref 6–20)
CO2: 28 mmol/L (ref 22–32)
Calcium: 8.1 mg/dL — ABNORMAL LOW (ref 8.9–10.3)
Chloride: 102 mmol/L (ref 101–111)
Creatinine, Ser: 0.7 mg/dL (ref 0.61–1.24)
GFR calc Af Amer: >60 mL/min (ref >60)
GFR calc non Af Amer: >60 mL/min (ref >60)
Glucose, Bld: 90 mg/dL (ref 65–99)
Potassium: 2.9 mmol/L — ABNORMAL LOW (ref 3.5–5.1)
Sodium: 138 mmol/L (ref 135–145)

## 2015-10-20 LAB — PROTIME-INR
INR: 1.42 (ref 0.00–1.49)
PROTHROMBIN TIME: 17.4 s — AB (ref 11.6–15.2)

## 2015-10-20 SURGERY — PACEMAKER IMPLANT

## 2015-10-20 MED ORDER — POTASSIUM CHLORIDE CRYS ER 20 MEQ PO TBCR: 40.0000 meq | EXTENDED_RELEASE_TABLET | Freq: Three times a day (TID) | ORAL | Status: DC

## 2015-10-20 MED ORDER — LIDOCAINE HCL (PF) 1 % IJ SOLN
INTRAMUSCULAR | Status: DC | PRN
  Administered 2015-10-20: 56 mL

## 2015-10-20 MED ORDER — MIDAZOLAM HCL 5 MG/5ML IJ SOLN
INTRAMUSCULAR | Status: AC
  Filled 2015-10-20: qty 5

## 2015-10-20 MED ORDER — POTASSIUM CHLORIDE CRYS ER 20 MEQ PO TBCR
20.0000 meq | EXTENDED_RELEASE_TABLET | ORAL | Status: DC | PRN
  Filled 2015-10-20: qty 1

## 2015-10-20 MED ORDER — ONDANSETRON HCL 4 MG/2ML IJ SOLN: 4.0000 mg | Freq: Four times a day (QID) | INTRAMUSCULAR | Status: DC | PRN

## 2015-10-20 MED ORDER — SODIUM CHLORIDE 0.9 % IR SOLN
80.0000 mg | Status: AC
  Administered 2015-10-20: 80 mg
  Filled 2015-10-20: qty 2

## 2015-10-20 MED ORDER — CEFAZOLIN SODIUM-DEXTROSE 2-4 GM/100ML-% IV SOLN
2.0000 g | INTRAVENOUS | Status: AC
  Administered 2015-10-20: 2 g via INTRAVENOUS
  Filled 2015-10-20: qty 100

## 2015-10-20 MED ORDER — DEXTROSE 50 % IV SOLN
INTRAVENOUS | Status: AC
  Filled 2015-10-20: qty 50

## 2015-10-20 MED ORDER — ASPIRIN EC 81 MG PO TBEC: 81.0000 mg | DELAYED_RELEASE_TABLET | Freq: Every day | ORAL | Status: DC

## 2015-10-20 MED ORDER — ATORVASTATIN CALCIUM 40 MG PO TABS
40.0000 mg | ORAL_TABLET | Freq: Every day | ORAL | Status: DC
  Administered 2015-10-20 – 2015-10-23 (×4): 40 mg via ORAL
  Filled 2015-10-20 (×4): qty 1

## 2015-10-20 MED ORDER — SODIUM CHLORIDE 0.9 % IR SOLN
Status: AC
  Filled 2015-10-20: qty 2

## 2015-10-20 MED ORDER — HEPARIN SODIUM (PORCINE) 5000 UNIT/ML IJ SOLN
5000.0000 [IU] | Freq: Three times a day (TID) | INTRAMUSCULAR | Status: DC
  Administered 2015-10-20 – 2015-10-21 (×2): 5000 [IU] via SUBCUTANEOUS
  Filled 2015-10-20 (×2): qty 1

## 2015-10-20 MED ORDER — SODIUM CHLORIDE 0.9 % IV SOLN: 250.0000 mL | INTRAVENOUS | Status: DC

## 2015-10-20 MED ORDER — FUROSEMIDE 10 MG/ML IJ SOLN
80.0000 mg | Freq: Two times a day (BID) | INTRAMUSCULAR | Status: AC
  Administered 2015-10-20 – 2015-10-22 (×4): 80 mg via INTRAVENOUS
  Filled 2015-10-20 (×4): qty 8

## 2015-10-20 MED ORDER — POTASSIUM CHLORIDE CRYS ER 20 MEQ PO TBCR
40.0000 meq | EXTENDED_RELEASE_TABLET | Freq: Three times a day (TID) | ORAL | Status: AC
  Administered 2015-10-20 (×2): 40 meq via ORAL
  Filled 2015-10-20 (×2): qty 2

## 2015-10-20 MED ORDER — CEFAZOLIN SODIUM-DEXTROSE 2-4 GM/100ML-% IV SOLN
INTRAVENOUS | Status: AC
  Filled 2015-10-20: qty 100

## 2015-10-20 MED ORDER — FENTANYL CITRATE (PF) 100 MCG/2ML IJ SOLN
INTRAMUSCULAR | Status: AC
  Filled 2015-10-20: qty 2

## 2015-10-20 MED ORDER — AMLODIPINE BESYLATE 10 MG PO TABS
10.0000 mg | ORAL_TABLET | Freq: Every day | ORAL | Status: DC
  Administered 2015-10-20 – 2015-10-24 (×5): 10 mg via ORAL
  Filled 2015-10-20 (×5): qty 1

## 2015-10-20 MED ORDER — MIDAZOLAM HCL 5 MG/5ML IJ SOLN
INTRAMUSCULAR | Status: DC | PRN
  Administered 2015-10-20 (×3): 1 mg via INTRAVENOUS

## 2015-10-20 MED ORDER — HEPARIN (PORCINE) IN NACL 2-0.9 UNIT/ML-% IJ SOLN
INTRAMUSCULAR | Status: AC
  Filled 2015-10-20: qty 500

## 2015-10-20 MED ORDER — LIDOCAINE HCL (PF) 1 % IJ SOLN
INTRAMUSCULAR | Status: AC
  Filled 2015-10-20: qty 60

## 2015-10-20 MED ORDER — DEXTROSE 50 % IV SOLN
25.0000 mL | Freq: Once | INTRAVENOUS | Status: AC
  Administered 2015-10-20: 25 mL via INTRAVENOUS

## 2015-10-20 MED ORDER — INSULIN DETEMIR 100 UNIT/ML ~~LOC~~ SOLN
26.0000 [IU] | Freq: Two times a day (BID) | SUBCUTANEOUS | Status: DC
  Administered 2015-10-20 – 2015-10-21 (×2): 26 [IU] via SUBCUTANEOUS
  Filled 2015-10-20 (×4): qty 0.26

## 2015-10-20 MED ORDER — ASPIRIN EC 81 MG PO TBEC
81.0000 mg | DELAYED_RELEASE_TABLET | Freq: Every day | ORAL | Status: DC
  Administered 2015-10-20 – 2015-10-24 (×5): 81 mg via ORAL
  Filled 2015-10-20 (×6): qty 1

## 2015-10-20 MED ORDER — HEPARIN (PORCINE) IN NACL 2-0.9 UNIT/ML-% IJ SOLN
INTRAMUSCULAR | Status: DC | PRN
  Administered 2015-10-20: 15:00:00

## 2015-10-20 MED ORDER — CEFAZOLIN SODIUM 1-5 GM-% IV SOLN
1.0000 g | Freq: Four times a day (QID) | INTRAVENOUS | Status: AC
  Administered 2015-10-20 – 2015-10-21 (×3): 1 g via INTRAVENOUS
  Filled 2015-10-20 (×3): qty 50

## 2015-10-20 MED ORDER — CHLORHEXIDINE GLUCONATE 4 % EX LIQD: 60.0000 mL | Freq: Once | CUTANEOUS | Status: DC

## 2015-10-20 MED ORDER — POTASSIUM CHLORIDE CRYS ER 20 MEQ PO TBCR
40.0000 meq | EXTENDED_RELEASE_TABLET | Freq: Two times a day (BID) | ORAL | Status: DC
  Administered 2015-10-21 – 2015-10-23 (×5): 40 meq via ORAL
  Filled 2015-10-20 (×5): qty 2

## 2015-10-20 MED ORDER — LISINOPRIL 10 MG PO TABS
20.0000 mg | ORAL_TABLET | Freq: Every day | ORAL | Status: DC
  Administered 2015-10-20 – 2015-10-24 (×5): 20 mg via ORAL
  Filled 2015-10-20: qty 2
  Filled 2015-10-20: qty 1
  Filled 2015-10-20 (×3): qty 2

## 2015-10-20 MED ORDER — FENTANYL CITRATE (PF) 100 MCG/2ML IJ SOLN
INTRAMUSCULAR | Status: DC | PRN
  Administered 2015-10-20: 25 ug via INTRAVENOUS
  Administered 2015-10-20: 12.5 ug via INTRAVENOUS

## 2015-10-20 MED ORDER — POTASSIUM CHLORIDE 10 MEQ/50ML IV SOLN: 10.0000 meq | INTRAVENOUS | Status: DC

## 2015-10-20 MED ORDER — ACETAMINOPHEN 325 MG PO TABS: 325.0000 mg | ORAL_TABLET | ORAL | Status: DC | PRN

## 2015-10-20 MED ORDER — POTASSIUM CHLORIDE CRYS ER 20 MEQ PO TBCR
20.0000 meq | EXTENDED_RELEASE_TABLET | ORAL | Status: DC
  Administered 2015-10-20: 20 meq via ORAL

## 2015-10-20 MED ORDER — SODIUM CHLORIDE 0.9 % IV SOLN
INTRAVENOUS | Status: DC
  Administered 2015-10-20: 14:00:00 via INTRAVENOUS

## 2015-10-20 SURGICAL SUPPLY — 9 items
CABLE SURGICAL S-101-97-12 (CABLE) ×2 IMPLANT
INGEVITY MRI 7741-52CM (Lead) ×3 IMPLANT
INGEVITY MRI 7742-59CM (Lead) ×3 IMPLANT
LEAD PACING INGEVITY MRI 52CM (Lead) IMPLANT
LEAD PACING INGEVITY MRI 59CM (Lead) IMPLANT
PACEMAKER ACCOLADE DR-EL (Pacemaker) ×2 IMPLANT
PAD DEFIB LIFELINK (PAD) ×2 IMPLANT
SHEATH CLASSIC 7F (SHEATH) ×4 IMPLANT
TRAY PACEMAKER INSERTION (PACKS) ×2 IMPLANT

## 2015-10-20 NOTE — Consult Note (Addendum)
ELECTROPHYSIOLOGY CONSULT NOTE    Patient ID: Christian Rasmussen MRN: [id number], DOB/AGE: Aug 16, 1940 75 y.o.  Admit date: 10/12/2015 Date of Consult: 10/20/2015  Primary Physician: Monico Blitz, MD Primary Cardiologist: Dr. Jacinta Shoe Requesting MD: Dr. Roxy Manns  Reason for Consultation: CHB  HPI: Christian Rasmussen is a 75 y.o. male with PMHx of AFlutter with hx of ablation 2014, DM, HLD is now POD #5  CABG x4 and MV repair by Dr. Roxy Manns. We are asked to see regarding persistent CHB. The patient had RBBB/LAFB and first degree AV block prior to his heart surgery. He has not had syncope. He feels well.   Past Medical History  Diagnosis Date  . CAD (coronary artery disease)     status post Taxus stent patency of RCA 2004, Cardiolite negative for ischemia in 2010.  Marland Kitchen Dyslipidemia   . Hypertension   . Right bundle branch block (RBBB) with left anterior hemiblock   . Typical atrial flutter ([place])     s/p ablation 11-23-2012 by Dr Rayann Heman  . Overweight   . Diabetes mellitus ([place])   . Snores   . History of kidney stones   . Aortic stenosis 10/14/2015  . 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 Mitral regurgitation 10/15/2015    Moderate-severe by intra-operative TEE  . Aortic insufficiency 10/15/2015    mild (1+/2+) by TEE     Surgical History:  Past Surgical History  Procedure Laterality Date  . Ankle surgery Left     total ankle replacement  . Ablation  11-23-2012    s/p cavotricuspid isthmus ablation by Dr Rayann Heman  . Coronary angioplasty      3 stents  . Cataract extraction Right   . Cataract extraction w/phaco Left 09/13/2013    Procedure: CATARACT EXTRACTION PHACO AND INTRAOCULAR LENS  PLACEMENT (IOC);  Surgeon: Tonny Branch, MD;  Location: [facility] ORS;  Service: Ophthalmology;  Laterality: Left;  CDE 9.38  . Atrial flutter ablation N/A 11/23/2012    Procedure: ATRIAL FLUTTER ABLATION;  Surgeon: Thompson Grayer, MD;  Location: [facility] CATH LAB;  Service: Cardiovascular;  Laterality: N/A;  . Colonoscopy N/A 10/10/2014    Procedure: COLONOSCOPY;  Surgeon: Rogene Houston, MD;  Location: [facility] ENDO SUITE;  Service: Endoscopy;  Laterality: N/A;  830  . Cardiac catheterization N/A 10/13/2015    Procedure: Left Heart Cath and Coronary Angiography;  Surgeon: Peter M Martinique, MD;  Location: [place] CV LAB;  Service: Cardiovascular;  Laterality: N/A;  . Coronary artery bypass graft N/A 10/15/2015    Procedure: CORONARY ARTERY BYPASS GRAFTING (CABG) X4 LIMA-LAD; SEQ SVG-PD-PL; SVG-OM1 ENDOSCOPIC GREATER SAPHENOUS VEIN HARVEST(EVH) BILAT LOWER EXTREM;  Surgeon: Rexene Alberts, MD;  Location: [place];  Service: Open Heart Surgery;  Laterality: N/A;  . Tee without cardioversion N/A 10/15/2015    Procedure: TRANSESOPHAGEAL ECHOCARDIOGRAM (TEE);  Surgeon: Rexene Alberts, MD;  Location: [place];  Service: Open Heart Surgery;  Laterality: N/A;  . Mitral valve repair N/A 10/15/2015    Procedure: MITRAL VALVE REPAIR (MVR), # 28 MEMO 3-D RING ANNULOPLASTY AND COMPLEX VALVE REPAIR;  Surgeon: Rexene Alberts, MD;  Location: [place];  Service: Open Heart Surgery;  Laterality: N/A;  Prescriptions prior to admission  Medication Sig Dispense Refill Last Dose  . aspirin EC 81 MG tablet Take 1 tablet (81 mg total) by mouth daily. 90 tablet 3 10/12/2015 at Unknown time  . ASTAXANTHIN PO Take 10 mg by mouth daily.   10/12/2015 at Unknown time  . atorvastatin (LIPITOR) 40 MG tablet Take 1 tablet by mouth daily.   10/12/2015 at Unknown time  . Berberine Chloride POWD Take 1,200 mg by mouth daily.   10/12/2015 at Unknown time  . carvedilol (COREG) 6.25 MG tablet TAKE 1 TABLET TWICE DAILY 180 tablet 3 10/12/2015 at 0545  . chlorthalidone  (HYGROTON) 25 MG tablet Take 25 mg by mouth daily.   10/12/2015 at Unknown time  . Coenzyme Q10 (CO Q10) 100 MG CAPS Take 1 tablet by mouth daily.   10/12/2015 at Unknown time  . fish oil-omega-3 fatty acids 1000 MG capsule Take 2 g by mouth daily.   10/12/2015 at Unknown time  . KRILL OIL 1000 MG CAPS Take 1 capsule by mouth daily.    Past Week at Unknown time  . lisinopril (PRINIVIL,ZESTRIL) 20 MG tablet Take 1 tablet (20 mg total) by mouth daily. 90 tablet 3 10/12/2015 at Unknown time  . metFORMIN (GLUCOPHAGE) 500 MG tablet Take 1,000 mg by mouth 2 (two) times daily.   2 10/12/2015 at Unknown time  . Multiple Vitamin (MULITIVITAMIN WITH MINERALS) TABS Take 1 tablet by mouth daily.   10/12/2015 at Unknown time  . OVER THE COUNTER MEDICATION Take 1 capsule by mouth 3 (three) times daily. Glucose Essentials- Chromium, [place] kino tree extract, Gymnema, Tursaline Extract,Vanadyl Sulfate, Banaba Extract   10/12/2015 at Unknown time    Inpatient Medications:  . acetaminophen  1,000 mg Oral Q6H  . amLODipine  10 mg Oral Daily  . antiseptic oral rinse  7 mL Mouth Rinse BID  . aspirin EC  81 mg Oral Daily  . atorvastatin  40 mg Oral q1800  . bisacodyl  10 mg Oral Daily   Or  . bisacodyl  10 mg Rectal Daily  . docusate sodium  200 mg Oral Daily  . enoxaparin (LOVENOX) injection  40 mg Subcutaneous Q24H  . ferrous [id number] C-folic acid  1 capsule Oral BID WC  . furosemide  80 mg Intravenous BID  . insulin aspart  0-15 Units Subcutaneous TID WC  . insulin aspart  0-5 Units Subcutaneous QHS  . insulin detemir  26 Units Subcutaneous BID  . lisinopril  20 mg Oral Daily  . mupirocin ointment  1 application Nasal BID  . pantoprazole  40 mg Oral Daily  . [START ON 10/21/2015] potassium chloride  40 mEq Oral BID  . potassium chloride  40 mEq Oral TID  . sodium chloride flush  3 mL Intravenous Q12H  . sodium chloride flush  3 mL Intravenous Q12H  . warfarin  2.5 mg Oral q1800  . Warfarin - Physician  Dosing Inpatient   Does not apply q1800    Allergies: No Known Allergies  Social History   Social History  . Marital Status: Married    Spouse Name: N/A  . Number of Children: N/A  . 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 Smoking status: Former Smoker -- 1.00 packs/day for 28 years    Types: Cigarettes    Start date: 10/21/1956    Quit date: 05/10/1985  . Smokeless tobacco: Never Used  . Alcohol  Use: 0.0 oz/week    0 Standard drinks or equivalent per week     Comment: occasional wine  . Drug Use: No  . Sexual Activity: Yes    Birth Control/ Protection: None   Other Topics Concern  . Not on file   Social History Narrative   Lives in [place] with spouse.   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 Other Father 62    Died from MI.  . Other Mother     alive & well  . Leukemia Brother 59    died     Review of Systems: All other systems reviewed and are otherwise negative except as noted above.  Physical Exam: Filed Vitals:   10/20/15 0400 10/20/15 0500 10/20/15 0600 10/20/15 0803  BP: 141/67 157/72 131/91   Pulse: 88 86 85   Temp:    98.5 F (36.9 C)  TempSrc:    Oral  Resp: 18 20 21    Height:      Weight:      SpO2: 98% 97% 97%     GEN- The patient is well appearing, alert and oriented x 3 today.   HEENT: normocephalic, atraumatic; sclera clear, conjunctiva pink; hearing intact; oropharynx clear; neck supple, no JVP Lymph- no cervical lymphadenopathy Lungs- Clear to ausculation bilaterally, normal work of breathing.  No wheezes, rales, rhonchi Heart- Regular rate and rhythm, no murmurs, rubs or gallops, PMI not laterally displaced GI- soft, non-tender, non-distended, bowel sounds present Extremities- no clubbing, cyanosis, or edema; DP/PT/radial pulses 2+ bilaterally MS- no significant deformity or atrophy Skin- warm and dry, no rash or  lesion Psych- euthymic mood, full affect Neuro- no gross deficits observed  Labs:   Lab Results  Component Value Date   WBC 13.0* 10/20/2015   HGB 8.2* 10/20/2015   HCT 25.7* 10/20/2015   MCV 88.6 10/20/2015   PLT 214 10/20/2015    Recent Labs Lab 10/14/15 0431  10/20/15 0243  NA 141  < > 138  K 3.6  < > 2.9*  CL 103  < > 102  CO2 26  < > 28  BUN 12  < > 19  CREATININE 0.71  < > 0.70  CALCIUM 9.6  < > 8.1*  PROT 6.1*  --   --   BILITOT 0.5  --   --   ALKPHOS 53  --   --   ALT 26  --   --   AST 19  --   --   GLUCOSE 143*  < > 90  < > = values in this interval not displayed.    Radiology/Studies:    Dg Chest Port 1 View October 23, 2015  CLINICAL DATA:  Status post CABG x3.  Lower right back pain. EXAM: PORTABLE CHEST 1 VIEW COMPARISON:  One day prior FINDINGS: Right internal jugular Cordis sheath again identified. Hyperinflation. Removal of bilateral chest tubes. Prior median sternotomy. Midline trachea. Cardiomegaly accentuated by AP portable technique. Possible small left pleural effusion. No pneumothorax. mild pulmonary venous congestion. Left base airspace disease. IMPRESSION: Removal of bilateral chest tubes, without pneumothorax. Cardiomegaly with mild pulmonary venous congestion. Decreased left base aeration with probable pleural fluid and atelectasis. Electronically Signed   By: Abigail Miyamoto M.D.   On: 10/23/2015 09:02     EKG:nsr with CHB and ventricular pacing TELEMETRY: NSR with CHB  10/14/15: Echocardiogram Study Conclusions - Left ventricle: The cavity size was normal. Wall thickness  was  increased in a pattern of mild LVH. Systolic function was normal.  The estimated ejection fraction was in the range of 50% to 55%.  Features are consistent with a pseudonormal left ventricular  filling pattern, with concomitant abnormal relaxation and  increased filling pressure (grade 2 diastolic dysfunction). - Aortic valve: Moderately to severely calcified annulus.  There was  mild stenosis. There was mild regurgitation. Valve area (VTI):  1.3 cm^2. Valve area (Vmax): 1.09 cm^2. Valve area (Vmean): 1.29  cm^2. - Mitral valve: Mildly calcified annulus. There was mild  regurgitation. - Left atrium: The atrium was mildly dilated.  10/13/15: LHC Conclusion     Mid RCA to Dist RCA lesion, 100% stenosed. The lesion was previously treated with a drug-eluting stent greater than two years ago.  Mid RCA lesion, 100% stenosed. The lesion was previously treated with a drug-eluting stent greater than two years ago.  Ost RCA to Prox RCA lesion, 100% stenosed. The lesion was previously treated with a drug-eluting stent greater than two years ago.  Ost LM lesion, 75% stenosed.  Ost LM to LM lesion, 50% stenosed.  Prox Cx to Mid Cx lesion, 50% stenosed.  Prox LAD lesion, 90% stenosed.  The left ventricular systolic function is normal.  1. 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 Good LV function     Assessment and Plan:   1. NSTEMI, CAD/VHD (MR noted to be mod-severe inter-op)     S/p CABG POD #5     P/o anemia  2. CHB persists post-op today #5     V paced via epicardial wires. I have turned down his PPM to VVI at 30 and he has ventricular pacing at that rate indicating persistent CHB  3.  HTN - stable  4. Fluid OL - his volume status is improved. Will follow.  5. History of PAFlutter ablation 11/23/12     Not on a/c out patient as he has maintained NSR  6. Hypokalemia     Being replaced  7. Started on coumadin     INR today 1.4    Signed, Tommye Standard, Hershal Coria 10/20/2015 10:05 AM   EP Attending  Patient seen and examined. Agree with the findings as noted above. On exam he has RRR and clear lungs with minimal residual peripheral edema. The patient has persistent CHB after MV repair/CABG. He is dependent today down to 30 and has no reversible cause. I have discussed the risks/benefits/goals/expectations of PPM insertion and he  wishes to proceed.  Mikle Bosworth.D.

## 2015-10-20 NOTE — Progress Notes (Signed)
Pt needing almost constant reminders to not move left arm.  Family now present at bedside and assisting with reinforcing LUE restruction.

## 2015-10-20 NOTE — Care Management Important Message (Signed)
Important Message  Patient Details  Name: Christian Rasmussen MRN: [id number] Date of Birth: 16-May-1940   Medicare Important Message Given:  Yes    Nathen May 10/20/2015, 10:23 AM

## 2015-10-20 NOTE — Anesthesia Postprocedure Evaluation (Signed)
Anesthesia Post Note  Patient: TYLO KRASZEWSKI  Procedure(s) Performed: Procedure(s) (LRB): CORONARY ARTERY BYPASS GRAFTING (CABG) X4 LIMA-LAD; SEQ SVG-PD-PL; SVG-OM1 ENDOSCOPIC GREATER SAPHENOUS VEIN HARVEST(EVH) BILAT LOWER EXTREM (N/A) TRANSESOPHAGEAL ECHOCARDIOGRAM (TEE) (N/A) MITRAL VALVE REPAIR (MVR), # 64 MEMO 3-D RING ANNULOPLASTY AND COMPLEX VALVE REPAIR (N/A)  Patient location during evaluation: SICU Anesthesia Type: General Level of consciousness: sedated Pain management: pain level controlled Vital Signs Assessment: post-procedure vital signs reviewed and stable Respiratory status: patient remains intubated per anesthesia plan Cardiovascular status: stable Anesthetic complications: no     Last Vitals:  Filed Vitals:   10/20/15 1610 10/20/15 1721  BP: 148/70 152/71  Pulse: 85 91  Temp:  37.1 C  Resp: 22 20    Last Pain:  Filed Vitals:   10/20/15 1725  PainSc: Asleep   Pain Goal: Patients Stated Pain Goal: 3 (10/17/15 1800)               Effie Berkshire

## 2015-10-20 NOTE — Progress Notes (Addendum)
      [address]       [place], [address]             [phone]        CARDIOTHORACIC SURGERY PROGRESS NOTE   R5 Days Post-Op Procedure(s) (LRB): CORONARY ARTERY BYPASS GRAFTING (CABG) X4 LIMA-LAD; SEQ SVG-PD-PL; SVG-OM1 ENDOSCOPIC GREATER SAPHENOUS VEIN HARVEST(EVH) BILAT LOWER EXTREM (N/A) TRANSESOPHAGEAL ECHOCARDIOGRAM (TEE) (N/A) MITRAL VALVE REPAIR (MVR), # 28 MEMO 3-D RING ANNULOPLASTY AND COMPLEX VALVE REPAIR (N/A)  Subjective: Feels well overall.  No complaints.  Objective: Vital signs: BP Readings from Last 1 Encounters:  10/20/15 131/91   Pulse Readings from Last 1 Encounters:  10/20/15 85   Resp Readings from Last 1 Encounters:  10/20/15 21   Temp Readings from Last 1 Encounters:  10/20/15 98.5 F (36.9 C) Oral    Hemodynamics:    Physical Exam:  Rhythm:   Sinus w/ 3rd degree AV block - escape rhythm 40-50 - appropriately A-sensing and V-pacing w/ low V thresholds  Breath sounds: clear  Heart sounds:  RRR w/out murmur  Incisions:  Clean and dry  Abdomen:  Soft, non-distended, non-tender  Extremities:  Warm, well-perfused    Intake/Output from previous day: 06/11 0701 - 06/12 0700 In: 970 [P.O.:480; I.V.:440; IV Piggyback:50] Out: 1985 [id number] Intake/Output this shift:    Lab Results:  CBC: Recent Labs  10/19/15 0500 10/20/15 0243  WBC 14.0* 13.0*  HGB 8.0* 8.2*  HCT 24.1* 25.7*  PLT 182 214    BMET:  Recent Labs  10/19/15 0500 10/20/15 0243  NA 138 138  K 3.2* 2.9*  CL 104 102  CO2 29 28  GLUCOSE 97 90  BUN 21* 19  CREATININE 0.63 0.70  CALCIUM 8.1* 8.1*     PT/INR:   Recent Labs  10/20/15 0243  LABPROT 17.4*  INR 1.42    CBG (last 3)   Recent Labs  10/19/15 1232 10/19/15 1640 10/19/15 2137  GLUCAP 134* 127* 72    ABG    Component Value Date/Time   PHART 7.415 10/16/2015 0530   PCO2ART 36.2 10/16/2015 0530   PO2ART 89.0 10/16/2015 0530   HCO3 23.2 10/16/2015 0530   TCO2 26  10/18/2015 1645   ACIDBASEDEF 1.0 10/16/2015 0530   O2SAT 97.0 10/16/2015 0530    CXR: n/a  Assessment/Plan: S/P Procedure(s) (LRB): CORONARY ARTERY BYPASS GRAFTING (CABG) X4 LIMA-LAD; SEQ SVG-PD-PL; SVG-OM1 ENDOSCOPIC GREATER SAPHENOUS VEIN HARVEST(EVH) BILAT LOWER EXTREM (N/A) TRANSESOPHAGEAL ECHOCARDIOGRAM (TEE) (N/A) MITRAL VALVE REPAIR (MVR), # 28 MEMO 3-D RING ANNULOPLASTY AND COMPLEX VALVE REPAIR (N/A)  Overall stable POD5 Remains in CHB w/ RBBB present pre-op - may need PPM Hypertension - BP still running a little high Expected post op acute blood loss anemia, Hgb stable 8.2 Expected post op atelectasis, mild Acute on chronic diastolic CHF with expected post-op volume excess, I/O's 1 liter negative yesterday but weight reportedly still 20 lbs above preop Post op leukocytosis w/out fever, likely reactive, resolving w/ WBC down to 13k Hypokalemia - diuretic induced   Consult EPS to consider PPM if CHB doesn't resolve soon  Continue IV lasix - increase dose  Increase Norvasc and continue lisinopril  Supplement potassium  Coumadin w/ low dose lovenox until INR > 1.8  decrease levemir + SSI  Awaiting bed on 2W stepdown for transfer   Rexene Alberts, MD 10/20/2015 8:43 AM

## 2015-10-20 NOTE — Interval H&P Note (Signed)
History and Physical Interval Note:  10/20/2015 2:11 PM  Christian Rasmussen  has presented today for surgery, with the diagnosis of hb  The various methods of treatment have been discussed with the patient and family. After consideration of risks, benefits and other options for treatment, the patient has consented to  Procedure(s): Pacemaker Implant (N/A) as a surgical intervention .  The patient's history has been reviewed, patient examined, no change in status, stable for surgery.  I have reviewed the patient's chart and labs.  Questions were answered to the patient's satisfaction.     Christian Rasmussen

## 2015-10-20 NOTE — Progress Notes (Signed)
Report given to Munjor, RN on 2W while pt in cath lab. All questions answered. Belongings delivered to 2W22. Wife picked up electrical devices, smart phone and tablet, while patient in cath lab.

## 2015-10-20 NOTE — Progress Notes (Signed)
Rehab Admissions Coordinator Note:  Patient was screened by Retta Diones for appropriateness for an Inpatient Acute Rehab Consult.  At this time, we are recommending Inpatient Rehab consult.  Retta Diones 10/20/2015, 8:24 AM  I can be reached at [phone].

## 2015-10-20 NOTE — H&P (View-Only) (Signed)
ELECTROPHYSIOLOGY CONSULT NOTE    Patient ID: Christian Rasmussen MRN: [id number], DOB/AGE: 1940/07/14 75 y.o.  Admit date: 10/12/2015 Date of Consult: 10/20/2015  Primary Physician: Monico Blitz, MD Primary Cardiologist: Dr. Jacinta Shoe Requesting MD: Dr. Roxy Manns  Reason for Consultation: CHB  HPI: Christian Rasmussen is a 75 y.o. male with PMHx of AFlutter with hx of ablation 2014, DM, HLD is now POD #5  CABG x4 and MV repair by Dr. Roxy Manns. We are asked to see regarding persistent CHB. The patient had RBBB/LAFB and first degree AV block prior to his heart surgery. He has not had syncope. He feels well.   Past Medical History  Diagnosis Date  . CAD (coronary artery disease)     status post Taxus stent patency of RCA 2004, Cardiolite negative for ischemia in 2010.  Marland Kitchen Dyslipidemia   . Hypertension   . Right bundle branch block (RBBB) with left anterior hemiblock   . Typical atrial flutter ([place])     s/p ablation 11-23-2012 by Dr Rayann Heman  . Overweight   . Diabetes mellitus ([place])   . Snores   . History of kidney stones   . Aortic stenosis 10/14/2015  . 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 Mitral regurgitation 10/15/2015    Moderate-severe by intra-operative TEE  . Aortic insufficiency 10/15/2015    mild (1+/2+) by TEE     Surgical History:  Past Surgical History  Procedure Laterality Date  . Ankle surgery Left     total ankle replacement  . Ablation  11-23-2012    s/p cavotricuspid isthmus ablation by Dr Rayann Heman  . Coronary angioplasty      3 stents  . Cataract extraction Right   . Cataract extraction w/phaco Left 09/13/2013    Procedure: CATARACT EXTRACTION PHACO AND INTRAOCULAR LENS  PLACEMENT (IOC);  Surgeon: Tonny Branch, MD;  Location: [facility] OR[facility];  Service: Ophthalmology;  Laterality: Left;  CDE 9.38  . Atrial flutter ablation N/A 11/23/2012    Procedure: ATRIAL FLUTTER ABLATION;  Surgeon: Thompson Grayer, MD;  Location: [facility] CATH LAB;  Service: Cardiovascular;  Laterality: N/A;  . Colonoscopy N/A 10/10/2014    Procedure: COLONOSCOPY;  Surgeon: Rogene Houston, MD;  Location: [facility] ENDO SUITE;  Service: Endoscopy;  Laterality: N/A;  830  . Cardiac catheterization N/A 10/13/2015    Procedure: Left Heart Cath and Coronary Angiography;  Surgeon: Peter M Martinique, MD;  Location: [facility] CV LAB;  Service: Cardiovascular;  Laterality: N/A;  . Coronary artery bypass graft N/A 10/15/2015    Procedure: CORONARY ARTERY BYPASS GRAFTING (CABG) X4 LIMA-LAD; SEQ SVG-PD-PL; SVG-OM1 ENDOSCOPIC GREATER SAPHENOUS VEIN HARVEST(EVH) BILAT LOWER EXTREM;  Surgeon: Rexene Alberts, MD;  Location: [facility];  Service: Open Heart Surgery;  Laterality: N/A;  . Tee without cardioversion N/A 10/15/2015    Procedure: TRANSESOPHAGEAL ECHOCARDIOGRAM (TEE);  Surgeon: Rexene Alberts, MD;  Location: [facility];  Service: Open Heart Surgery;  Laterality: N/A;  . Mitral valve repair N/A 10/15/2015    Procedure: MITRAL VALVE REPAIR (MVR), # 28 MEMO 3-D RING ANNULOPLASTY AND COMPLEX VALVE REPAIR;  Surgeon: Rexene Alberts, MD;  Location: [facility];  Service: Open Heart Surgery;  Laterality: N/A;  Prescriptions prior to admission  Medication Sig Dispense Refill Last Dose  . aspirin EC 81 MG tablet Take 1 tablet (81 mg total) by mouth daily. 90 tablet 3 10/12/2015 at Unknown time  . ASTAXANTHIN PO Take 10 mg by mouth daily.   10/12/2015 at Unknown time  . atorvastatin (LIPITOR) 40 MG tablet Take 1 tablet by mouth daily.   10/12/2015 at Unknown time  . Berberine Chloride POWD Take 1,200 mg by mouth daily.   10/12/2015 at Unknown time  . carvedilol (COREG) 6.25 MG tablet TAKE 1 TABLET TWICE DAILY 180 tablet 3 10/12/2015 at 0545  . chlorthalidone  (HYGROTON) 25 MG tablet Take 25 mg by mouth daily.   10/12/2015 at Unknown time  . Coenzyme Q10 (CO Q10) 100 MG CAPS Take 1 tablet by mouth daily.   10/12/2015 at Unknown time  . fish oil-omega-3 fatty acids 1000 MG capsule Take 2 g by mouth daily.   10/12/2015 at Unknown time  . KRILL OIL 1000 MG CAPS Take 1 capsule by mouth daily.    Past Week at Unknown time  . lisinopril (PRINIVIL,ZESTRIL) 20 MG tablet Take 1 tablet (20 mg total) by mouth daily. 90 tablet 3 10/12/2015 at Unknown time  . metFORMIN (GLUCOPHAGE) 500 MG tablet Take 1,000 mg by mouth 2 (two) times daily.   2 10/12/2015 at Unknown time  . Multiple Vitamin (MULITIVITAMIN WITH MINERALS) TABS Take 1 tablet by mouth daily.   10/12/2015 at Unknown time  . OVER THE COUNTER MEDICATION Take 1 capsule by mouth 3 (three) times daily. Glucose Essentials- Chromium, [place] kino tree extract, Gymnema, Tursaline Extract,Vanadyl Sulfate, Banaba Extract   10/12/2015 at Unknown time    Inpatient Medications:  . acetaminophen  1,000 mg Oral Q6H  . amLODipine  10 mg Oral Daily  . antiseptic oral rinse  7 mL Mouth Rinse BID  . aspirin EC  81 mg Oral Daily  . atorvastatin  40 mg Oral q1800  . bisacodyl  10 mg Oral Daily   Or  . bisacodyl  10 mg Rectal Daily  . docusate sodium  200 mg Oral Daily  . enoxaparin (LOVENOX) injection  40 mg Subcutaneous Q24H  . ferrous Q000111Q C-folic acid  1 capsule Oral BID WC  . furosemide  80 mg Intravenous BID  . insulin aspart  0-15 Units Subcutaneous TID WC  . insulin aspart  0-5 Units Subcutaneous QHS  . insulin detemir  26 Units Subcutaneous BID  . lisinopril  20 mg Oral Daily  . mupirocin ointment  1 application Nasal BID  . pantoprazole  40 mg Oral Daily  . [START ON 10/21/2015] potassium chloride  40 mEq Oral BID  . potassium chloride  40 mEq Oral TID  . sodium chloride flush  3 mL Intravenous Q12H  . sodium chloride flush  3 mL Intravenous Q12H  . warfarin  2.5 mg Oral q1800  . Warfarin - Physician  Dosing Inpatient   Does not apply q1800    Allergies: No Known Allergies  Social History   Social History  . Marital Status: Married    Spouse Name: N/A  . Number of Children: N/A  . 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 Smoking status: Former Smoker -- 1.00 packs/day for 28 years    Types: Cigarettes    Start date: 10/21/1956    Quit date: 05/10/1985  . Smokeless tobacco: Never Used  . Alcohol  Use: 0.0 oz/week    0 Standard drinks or equivalent per week     Comment: occasional wine  . Drug Use: No  . Sexual Activity: Yes    Birth Control/ Protection: None   Other Topics Concern  . Not on file   Social History Narrative   Lives in [place] with spouse.   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 Other Father 8    Died from MI.  . Other Mother     alive & well  . Leukemia Brother 57    died     Review of Systems: All other systems reviewed and are otherwise negative except as noted above.  Physical Exam: Filed Vitals:   10/20/15 0400 10/20/15 0500 10/20/15 0600 10/20/15 0803  BP: 141/67 157/72 131/91   Pulse: 88 86 85   Temp:    98.5 F (36.9 C)  TempSrc:    Oral  Resp: 18 20 21    Height:      Weight:      SpO2: 98% 97% 97%     GEN- The patient is well appearing, alert and oriented x 3 today.   HEENT: normocephalic, atraumatic; sclera clear, conjunctiva pink; hearing intact; oropharynx clear; neck supple, no JVP Lymph- no cervical lymphadenopathy Lungs- Clear to ausculation bilaterally, normal work of breathing.  No wheezes, rales, rhonchi Heart- Regular rate and rhythm, no murmurs, rubs or gallops, PMI not laterally displaced GI- soft, non-tender, non-distended, bowel sounds present Extremities- no clubbing, cyanosis, or edema; DP/PT/radial pulses 2+ bilaterally MS- no significant deformity or atrophy Skin- warm and dry, no rash or  lesion Psych- euthymic mood, full affect Neuro- no gross deficits observed  Labs:   Lab Results  Component Value Date   WBC 13.0* 10/20/2015   HGB 8.2* 10/20/2015   HCT 25.7* 10/20/2015   MCV 88.6 10/20/2015   PLT 214 10/20/2015    Recent Labs Lab 10/14/15 0431  10/20/15 0243  NA 141  < > 138  K 3.6  < > 2.9*  CL 103  < > 102  CO2 26  < > 28  BUN 12  < > 19  CREATININE 0.71  < > 0.70  CALCIUM 9.6  < > 8.1*  PROT 6.1*  --   --   BILITOT 0.5  --   --   ALKPHOS 53  --   --   ALT 26  --   --   AST 19  --   --   GLUCOSE 143*  < > 90  < > = values in this interval not displayed.    Radiology/Studies:    Dg Chest Port 1 View 10-28-15  CLINICAL DATA:  Status post CABG x3.  Lower right back pain. EXAM: PORTABLE CHEST 1 VIEW COMPARISON:  One day prior FINDINGS: Right internal jugular Cordis sheath again identified. Hyperinflation. Removal of bilateral chest tubes. Prior median sternotomy. Midline trachea. Cardiomegaly accentuated by AP portable technique. Possible small left pleural effusion. No pneumothorax. mild pulmonary venous congestion. Left base airspace disease. IMPRESSION: Removal of bilateral chest tubes, without pneumothorax. Cardiomegaly with mild pulmonary venous congestion. Decreased left base aeration with probable pleural fluid and atelectasis. Electronically Signed   By: Abigail Miyamoto M.D.   On: 10/28/2015 09:02     EKG:nsr with CHB and ventricular pacing TELEMETRY: NSR with CHB  10/14/15: Echocardiogram Study Conclusions - Left ventricle: The cavity size was normal. Wall thickness  was  increased in a pattern of mild LVH. Systolic function was normal.  The estimated ejection fraction was in the range of 50% to 55%.  Features are consistent with a pseudonormal left ventricular  filling pattern, with concomitant abnormal relaxation and  increased filling pressure (grade 2 diastolic dysfunction). - Aortic valve: Moderately to severely calcified annulus.  There was  mild stenosis. There was mild regurgitation. Valve area (VTI):  1.3 cm^2. Valve area (Vmax): 1.09 cm^2. Valve area (Vmean): 1.29  cm^2. - Mitral valve: Mildly calcified annulus. There was mild  regurgitation. - Left atrium: The atrium was mildly dilated.  10/13/15: LHC Conclusion     Mid RCA to Dist RCA lesion, 100% stenosed. The lesion was previously treated with a drug-eluting stent greater than two years ago.  Mid RCA lesion, 100% stenosed. The lesion was previously treated with a drug-eluting stent greater than two years ago.  Ost RCA to Prox RCA lesion, 100% stenosed. The lesion was previously treated with a drug-eluting stent greater than two years ago.  Ost LM lesion, 75% stenosed.  Ost LM to LM lesion, 50% stenosed.  Prox Cx to Mid Cx lesion, 50% stenosed.  Prox LAD lesion, 90% stenosed.  The left ventricular systolic function is normal.  1. 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 Good LV function     Assessment and Plan:   1. NSTEMI, CAD/VHD (MR noted to be mod-severe inter-op)     S/p CABG POD #5     P/o anemia  2. CHB persists post-op today #5     V paced via epicardial wires. I have turned down his PPM to VVI at 30 and he has ventricular pacing at that rate indicating persistent CHB  3.  HTN - stable  4. Fluid OL - his volume status is improved. Will follow.  5. History of PAFlutter ablation 11/23/12     Not on a/c out patient as he has maintained NSR  6. Hypokalemia     Being replaced  7. Started on coumadin     INR today 1.4    Signed, Tommye Standard, Hershal Coria 10/20/2015 10:05 AM   EP Attending  Patient seen and examined. Agree with the findings as noted above. On exam he has RRR and clear lungs with minimal residual peripheral edema. The patient has persistent CHB after MV repair/CABG. He is dependent today down to 30 and has no reversible cause. I have discussed the risks/benefits/goals/expectations of PPM insertion and he  wishes to proceed.  Mikle Bosworth.D.

## 2015-10-21 ENCOUNTER — Inpatient Hospital Stay (HOSPITAL_COMMUNITY): Payer: Medicare Other

## 2015-10-21 ENCOUNTER — Encounter (HOSPITAL_COMMUNITY): Payer: Self-pay | Admitting: Internal Medicine

## 2015-10-21 DIAGNOSIS — Z951 Presence of aortocoronary bypass graft: Secondary | ICD-10-CM

## 2015-10-21 DIAGNOSIS — I351 Nonrheumatic aortic (valve) insufficiency: Secondary | ICD-10-CM

## 2015-10-21 DIAGNOSIS — I350 Nonrheumatic aortic (valve) stenosis: Secondary | ICD-10-CM

## 2015-10-21 LAB — BASIC METABOLIC PANEL
Anion gap: 8 (ref 5–15)
BUN: 16 mg/dL (ref 6–20)
CALCIUM: 8.7 mg/dL — AB (ref 8.9–10.3)
CO2: 31 mmol/L (ref 22–32)
CREATININE: 0.74 mg/dL (ref 0.61–1.24)
Chloride: 102 mmol/L (ref 101–111)
GFR calc Af Amer: >60 mL/min (ref >60)
Glucose, Bld: 108 mg/dL — ABNORMAL HIGH (ref 65–99)
Potassium: 4.1 mmol/L (ref 3.5–5.1)
SODIUM: 141 mmol/L (ref 135–145)

## 2015-10-21 LAB — CBC
HCT: 26 % — ABNORMAL LOW (ref 39.0–52.0)
Hemoglobin: 8.2 g/dL — ABNORMAL LOW (ref 13.0–17.0)
MCH: 28.4 pg (ref 26.0–34.0)
MCHC: 31.5 g/dL (ref 30.0–36.0)
MCV: 90 fL (ref 78.0–100.0)
PLATELETS: 254 10*3/uL (ref 150–400)
RBC: 2.89 MIL/uL — ABNORMAL LOW (ref 4.22–5.81)
RDW: 15.8 % — AB (ref 11.5–15.5)
WBC: 13 10*3/uL — AB (ref 4.0–10.5)

## 2015-10-21 LAB — GLUCOSE, CAPILLARY
GLUCOSE-CAPILLARY: 114 mg/dL — AB (ref 65–99)
GLUCOSE-CAPILLARY: 136 mg/dL — AB (ref 65–99)
GLUCOSE-CAPILLARY: 145 mg/dL — AB (ref 65–99)
GLUCOSE-CAPILLARY: 156 mg/dL — AB (ref 65–99)
Glucose-Capillary: 139 mg/dL — ABNORMAL HIGH (ref 65–99)
Glucose-Capillary: 123 mg/dL — ABNORMAL HIGH (ref 65–99)
Glucose-Capillary: 127 mg/dL — ABNORMAL HIGH (ref 65–99)

## 2015-10-21 LAB — PROTIME-INR
INR: 1.32 (ref 0.00–1.49)
PROTHROMBIN TIME: 16.5 s — AB (ref 11.6–15.2)

## 2015-10-21 MED ORDER — WARFARIN VIDEO
Freq: Once | Status: AC
  Administered 2015-10-21: 09:00:00

## 2015-10-21 MED ORDER — INSULIN DETEMIR 100 UNIT/ML ~~LOC~~ SOLN
26.0000 [IU] | Freq: Every day | SUBCUTANEOUS | Status: DC
  Filled 2015-10-21: qty 0.26

## 2015-10-21 MED ORDER — CARVEDILOL 6.25 MG PO TABS
6.2500 mg | ORAL_TABLET | Freq: Two times a day (BID) | ORAL | Status: DC
  Administered 2015-10-21 – 2015-10-22 (×3): 6.25 mg via ORAL
  Filled 2015-10-21 (×3): qty 1

## 2015-10-21 MED ORDER — COUMADIN BOOK
Freq: Once | Status: AC
  Administered 2015-10-21: 09:00:00
  Filled 2015-10-21: qty 1

## 2015-10-21 NOTE — Clinical Social Work Note (Signed)
Clinical Social Work Assessment  Patient Details  Name: Christian Rasmussen MRN: [id number] Date of Birth: Jan 27, 1941  Date of referral:  10/21/15               Reason for consult:  Discharge Planning                Permission sought to share information with:  Facility Sport and exercise psychologist, Family Supports Permission granted to share information::  Yes, Release of Information Signed  Name::     Christian Rasmussen  Agency::  [facility]  Relationship::     Contact Information:     Housing/Transportation Living arrangements for the past 2 months:  [place] of Information:  Patient Patient Interpreter Needed:  None Criminal Activity/Legal Involvement Pertinent to Current Situation/Hospitalization:  No - Comment as needed Significant Relationships:  Spouse Lives with:  Spouse Do you feel safe going back to the place where you live?  Yes Need for family participation in patient care:  Yes (Comment)  Care giving concerns: The patient is aggregable for short term rehab at discharge. Patient would like to rebuild his/ strength to return home.    Social Worker assessment / plan:  CSW met with patient at beside to complete assessment. Patient was resting comfortably in bed. Patient's wife was also at bedside.  CSW explained PT recommendation for CIR placement. CSW explained that an alternate plan of SNFs placement may be needed.  CSW explained SNF search and placement process to the patient and patient's wife and answered their questions. The patient and patient's wife requested the CSW pursue alternate plan of SNFs placement. CSW will follow up with bed offers. 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  Patient/Family's Understanding of and Emotional Response to Diagnosis, Current Treatment, and Prognosis:  The patient has a good understanding of why he was admitted. Heunderstands the care plan and what he will need post discharge.  Emotional Assessment Appearance:  Appears stated age Attitude/Demeanor/Rapport:   (Patient was welcoming of CSW and appropriate.) Affect (typically observed):  Accepting, Calm, Appropriate Orientation:  Oriented to Self, Oriented to Place, Oriented to  Time, Oriented to Situation Alcohol / Substance use:  Not Applicable Psych involvement (Current and /or in the community):  No (Comment)  Discharge Needs  Concerns to be addressed:  Discharge Planning Concerns Readmission within the last 30 days:  No Current discharge risk:  Physical Impairment Barriers to Discharge:  Continued Medical Work up   [employer], LCSW 10/21/2015, 1:54 PM

## 2015-10-21 NOTE — Progress Notes (Addendum)
[address]       [place] [address]             [phone]      1 Day Post-Op Procedure(s) (LRB): Pacemaker Implant (N/A) Subjective: Feels fairly well Pacemaker placed yesterday, some episodes of flutter/tachy(short bursts)   Objective: Vital signs in last 24 hours: Temp:  [98.2 F (36.8 C)-98.7 F (37.1 C)] 98.2 F (36.8 C) (06/13 0351) Pulse Rate:  [0-108] 94 (06/13 0351) Cardiac Rhythm:  [-] Ventricular paced (06/12 2016) Resp:  [0-45] 18 (06/13 0351) BP: (127-166)/(66-91) 155/74 mmHg (06/13 0351) SpO2:  [0 %-100 %] 94 % (06/13 0351) Weight:  [249 lb 1.6 oz (112.991 kg)-249 lb 4.8 oz (113.082 kg)] 249 lb 1.6 oz (112.991 kg) (06/13 0351)  Hemodynamic parameters for last 24 hours:    Intake/Output from previous day: 06/12 0701 - 06/13 0700 In: -  Out: 4560 [Urine:4560] Intake/Output this shift:    General appearance: alert, cooperative and no distress Heart: regular rate and rhythm Lungs: min dim in bases Abdomen: benign Extremities: minor edema Wound: some sersang drainage left evh site, echymosis bil thighs  Lab Results:  Recent Labs  10/20/15 0243 10/21/15 0324  WBC 13.0* 13.0*  HGB 8.2* 8.2*  HCT 25.7* 26.0*  PLT 214 254   BMET:  Recent Labs  10/20/15 0243 10/21/15 0324  NA 138 141  K 2.9* 4.1  CL 102 102  CO2 28 31  GLUCOSE 90 108*  BUN 19 16  CREATININE 0.70 0.74  CALCIUM 8.1* 8.7*    PT/INR:  Recent Labs  10/21/15 0324  LABPROT 16.5*  INR 1.32   ABG    Component Value Date/Time   PHART 7.415 10/16/2015 0530   HCO3 23.2 10/16/2015 0530   TCO2 26 10/18/2015 1645   ACIDBASEDEF 1.0 10/16/2015 0530   O2SAT 97.0 10/16/2015 0530   CBG (last 3)   Recent Labs  10/20/15 2041 10/21/15 0408 10/21/15 0618  GLUCAP 188* 114* 136*    Meds Scheduled Meds: . amLODipine  10 mg Oral Daily  . antiseptic oral rinse  7 mL Mouth Rinse BID  . aspirin EC  81 mg Oral Daily  . atorvastatin  40 mg Oral q1800    . bisacodyl  10 mg Oral Daily   Or  . bisacodyl  10 mg Rectal Daily  .  ceFAZolin (ANCEF) IV  1 g Intravenous Q6H  . docusate sodium  200 mg Oral Daily  . enoxaparin (LOVENOX) injection  40 mg Subcutaneous Q24H  . ferrous Q000111Q C-folic acid  1 capsule Oral BID WC  . furosemide  80 mg Intravenous BID  . heparin subcutaneous  5,000 Units Subcutaneous Q8H  . insulin aspart  0-15 Units Subcutaneous TID WC  . insulin aspart  0-5 Units Subcutaneous QHS  . insulin detemir  26 Units Subcutaneous BID  . lisinopril  20 mg Oral Daily  . pantoprazole  40 mg Oral Daily  . potassium chloride  40 mEq Oral BID  . potassium chloride  40 mEq Oral TID  . sodium chloride flush  3 mL Intravenous Q12H  . sodium chloride flush  3 mL Intravenous Q12H  . warfarin  2.5 mg Oral q1800  . Warfarin - Physician Dosing Inpatient   Does not apply q1800   Continuous Infusions: . sodium chloride    . sodium chloride 20 mL/hr at 10/17/15 2006   PRN Meds:.sodium chloride, acetaminophen, ondansetron (ZOFRAN) IV, ondansetron (ZOFRAN) IV, oxyCODONE,  sodium chloride flush, sodium chloride flush, traMADol  Xrays No results found.  Assessment/Plan: S/P Procedure(s) (LRB): Pacemaker Implant (N/A)  1 pacer being interrogated currently, EP managing 2 H/H stable  3 cont diuresis for volume overload 4 cont coumadin, on sub q heparin (? Change to lovenox) 5 sugars adeq controlled 6 push rehab/pulm toilet  LOS: 9 days    Rasmussen,Christian E 10/21/2015  I have seen and examined the patient and agree with the assessment and plan as outlined.  Some confusion and impulsive behavior overnight.  Labs okay but CBG's low - likely due to being NPO most of yesterday for PPM placement but still receiving levemir insulin - will decrease.  Can restart beta blocker now that permanent pacer in place.  Continue Norvasc and lisinopril.  Continue lasix IV today.  Patient was already on SQ lovenox for DVT prophylaxis when SQ  heparin added yesterday - will d/c heparin.  D/C temporary pacing wires.  Continue coumadin.  Christian Alberts, MD 10/21/2015 9:37 AM

## 2015-10-21 NOTE — Progress Notes (Signed)
Physical Therapy Treatment Patient Details Name: Christian Rasmussen MRN: [id number] DOB: 30-Oct-1940 Today's Date: 10/21/2015    History of Present Illness Christian Rasmussen is a 75 y.o. male with past medical history of CAD (s/p PCI to RCA in 2004), atrial flutter (s/p ablation 2014), HTN, Type 2 DM, and HLD who presented to [facility] on 10/12/2015 for evaluation of chest pain.  Pt underwent CABG x4 and MVR on 10/15/15.  Pt then had pacemaker placed 10/20/15.      PT Comments    Pt admitted with above diagnosis. Pt currently with functional limitations due to balance and endurance deficits. Pt was able to ambulate with better balance overall today with RW.  Postural stability worsens as he fatigues with pt needing incr support.  Still feel that CIR would benefit pt greatly so he can incr strength given his sternal precautions.  Wife supportive.  Will continue acute PT.  Pt will benefit from skilled PT to increase their independence and safety with mobility to allow discharge to the venue listed below.    Follow Up Recommendations  CIR;Supervision/Assistance - 24 hour     Equipment Recommendations  Rolling walker with 5" wheels    Recommendations for Other Services Rehab consult     Precautions / Restrictions Precautions Precautions: Fall;Sternal Restrictions Weight Bearing Restrictions: Yes (sternal precautions)    Mobility  Bed Mobility Overal bed mobility: Needs Assistance Bed Mobility: Rolling;Sidelying to Sit Rolling: Min assist Sidelying to sit: Min assist       General bed mobility comments: Needed assist for log roll for sternal precautions needing assist for elevation of trunk.  Pt needed incr cues for sternal precautions as he forgets.   Transfers Overall transfer level: Needs assistance Equipment used: Rolling walker (2 wheeled) Transfers: Sit to/from Stand Sit to Stand: Min assist;+2 safety/equipment;From elevated surface         General transfer comment: Pt needed  max cues to hold pillow with repetition of cues constantly or pt would try to use hands.  Pt is able to power up with LEs with constant cues.    Ambulation/Gait Ambulation/Gait assistance: Mod assist;Min assist;+2 safety/equipment Ambulation Distance (Feet): 280 Feet (100 feet, 100 feet then 80 feet.  ) Assistive device: Rolling walker (2 wheeled) Gait Pattern/deviations: Step-through pattern;Decreased stride length;Antalgic;Trunk flexed;Wide base of support;Drifts right/left   Gait velocity interpretation: <1.8 ft/sec, indicative of risk for recurrent falls General Gait Details: Pt with better gait overall today but still needs cues verbally and tactilly for balance.  As pt fatigues, he will drift to left as well as loses his postural stability.  Also more impulsivity and decr safety as he fatigues.  Wife pushed chair behind pt . Pt took two seated rest breaks.     Stairs            Wheelchair Mobility    Modified Rankin (Stroke Patients Only)       Balance Overall balance assessment: Needs assistance;History of Falls Sitting-balance support: No upper extremity supported;Feet supported Sitting balance-Leahy Scale: Good     Standing balance support: Bilateral upper extremity supported;During functional activity Standing balance-Leahy Scale: Poor Standing balance comment: relies on UE support bil.  Requires cues for technique                    Cognition Arousal/Alertness: Awake/alert Behavior During Therapy: Anxious;Impulsive Overall Cognitive Status: Impaired/Different from baseline Area of Impairment: Orientation;Memory;Following commands;Safety/judgement;Awareness;Problem solving Orientation Level: Disoriented to;Time;Situation;Place   Memory: Decreased short-term memory  Following Commands: Follows one step commands inconsistently Safety/Judgement: Decreased awareness of safety Awareness: Intellectual Problem Solving: Difficulty sequencing;Requires verbal  cues General Comments: Pt confused.  Needs repetition and cues.     Exercises      General Comments General comments (skin integrity, edema, etc.): hand edema better today.      Pertinent Vitals/Pain Pain Assessment: Faces Faces Pain Scale: Hurts even more Pain Location: incision Pain Descriptors / Indicators: Grimacing;Guarding;Operative site guarding Pain Intervention(s): Limited activity within patient's tolerance;Monitored during session;Repositioned;Heat applied  VSS with O2 >90% on RA.    Home Living                      Prior Function            PT Goals (current goals can now be found in the care plan section) Progress towards PT goals: Progressing toward goals    Frequency  Min 3X/week    PT Plan Current plan remains appropriate    Co-evaluation             End of Session Equipment Utilized During Treatment: Gait belt Activity Tolerance: Patient limited by fatigue Patient left: in chair;with call bell/phone within reach;with chair alarm set;with family/visitor present     Time: [id number] PT Time Calculation (min) (ACUTE ONLY): 31 min  Charges:  $Gait Training: 23-37 mins                    G CodesIrwin Brakeman F 2015-10-25, 11:44 AM [facility] Acute Rehabilitation [phone] (pager)

## 2015-10-21 NOTE — Progress Notes (Signed)
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 Ambulating with PT. No CP/SOB  Objective:  Temp:  [98.2 F (36.8 C)-98.7 F (37.1 C)] 98.2 F (36.8 C) (06/13 0351) Pulse Rate:  [0-108] 94 (06/13 0351) Resp:  [0-45] 18 (06/13 0351) BP: (127-166)/(66-90) 155/74 mmHg (06/13 0351) SpO2:  [0 %-98 %] 94 % (06/13 0351) Weight:  [249 lb 1.6 oz (112.991 kg)-249 lb 4.8 oz (113.082 kg)] 249 lb 1.6 oz (112.991 kg) (06/13 0351) Weight change: -8 lb 3.2 oz (-3.718 kg)  Intake/Output from previous day: 06/12 0701 - 06/13 0700 In: -  Out: 4560 [Urine:4560]  Intake/Output from this shift: Total I/O In: 240 [P.O.:240] Out: 1450 [Urine:1450]  Physical Exam: General appearance: alert and no distress Neck: no adenopathy, no carotid bruit, no JVD, supple, symmetrical, trachea midline and thyroid not enlarged, symmetric, no tenderness/mass/nodules Lungs: clear to auscultation bilaterally Heart: regular rate and rhythm, S1, S2 normal, no murmur, click, rub or gallop Extremities: 1+ bilater LE edema  Lab Results: Results for orders placed or performed during the hospital encounter of 10/12/15 (from the past 48 hour(s))  Glucose, capillary     Status: Abnormal   Collection Time: 10/19/15 10:24 AM  Result Value Ref Range   Glucose-Capillary 151 (H) 65 - 99 mg/dL  Glucose, capillary     Status: Abnormal   Collection Time: 10/19/15 12:32 PM  Result Value Ref Range   Glucose-Capillary 134 (H) 65 - 99 mg/dL   Comment 1 Notify RN   Glucose, capillary     Status: Abnormal   Collection Time: 10/19/15  4:40 PM  Result Value Ref Range   Glucose-Capillary 127 (H) 65 - 99 mg/dL   Comment 1 Notify RN   Glucose, capillary     Status: None   Collection Time: 10/19/15  9:37 PM  Result Value Ref Range   Glucose-Capillary 72 65 - 99 mg/dL   Comment 1 Capillary Specimen    Comment 2 Notify RN    Comment 3 Document in Chart   Protime-INR     Status:  Abnormal   Collection Time: 10/20/15  2:43 AM  Result Value Ref Range   Prothrombin Time 17.4 (H) 11.6 - 15.2 seconds   INR 1.42 0.00 - 1.24  Basic metabolic panel     Status: Abnormal   Collection Time: 10/20/15  2:43 AM  Result Value Ref Range   Sodium 138 135 - 145 mmol/L   Potassium 2.9 (L) 3.5 - 5.1 mmol/L   Chloride 102 101 - 111 mmol/L   CO2 28 22 - 32 mmol/L   Glucose, Bld 90 65 - 99 mg/dL   BUN 19 6 - 20 mg/dL   Creatinine, Ser 0.70 0.61 - 1.24 mg/dL   Calcium 8.1 (L) 8.9 - 10.3 mg/dL   GFR calc non Af Amer >60 >60 mL/min   GFR calc Af Amer >60 >60 mL/min    Comment: (NOTE) The eGFR has been calculated using the CKD EPI equation. This calculation has not been validated in all clinical situations. eGFR's persistently <60 mL/min signify possible Chronic Kidney Disease.    Anion gap 8 5 - 15  CBC     Status: Abnormal   Collection Time: 10/20/15  2:43 AM  Result Value Ref Range   WBC 13.0 (H) 4.0 - 10.5 K/uL   RBC 2.90 (L) 4.22 - 5.81 MIL/uL   Hemoglobin 8.2 (L) 13.0 - 17.0 g/dL  HCT 25.7 (L) 39.0 - 52.0 %   MCV 88.6 78.0 - 100.0 fL   MCH 28.3 26.0 - 34.0 pg   MCHC 31.9 30.0 - 36.0 g/dL   RDW 15.7 (H) 11.5 - 15.5 %   Platelets 214 150 - 400 K/uL  Glucose, capillary     Status: None   Collection Time: 10/20/15  8:01 AM  Result Value Ref Range   Glucose-Capillary 79 65 - 99 mg/dL   Comment 1 Capillary Specimen    Comment 2 Notify RN   Glucose, capillary     Status: Abnormal   Collection Time: 10/20/15 12:30 PM  Result Value Ref Range   Glucose-Capillary 155 (H) 65 - 99 mg/dL   Comment 1 Capillary Specimen    Comment 2 Notify RN   Glucose, capillary     Status: Abnormal   Collection Time: 10/20/15  4:44 PM  Result Value Ref Range   Glucose-Capillary 56 (L) 65 - 99 mg/dL  Glucose, capillary     Status: None   Collection Time: 10/20/15  5:24 PM  Result Value Ref Range   Glucose-Capillary 86 65 - 99 mg/dL  Glucose, capillary     Status: Abnormal   Collection  Time: 10/20/15  8:41 PM  Result Value Ref Range   Glucose-Capillary 188 (H) 65 - 99 mg/dL  Protime-INR     Status: Abnormal   Collection Time: 10/21/15  3:24 AM  Result Value Ref Range   Prothrombin Time 16.5 (H) 11.6 - 15.2 seconds   INR 1.32 0.00 - 4.43  Basic metabolic panel     Status: Abnormal   Collection Time: 10/21/15  3:24 AM  Result Value Ref Range   Sodium 141 135 - 145 mmol/L   Potassium 4.1 3.5 - 5.1 mmol/L    Comment: DELTA CHECK NOTED   Chloride 102 101 - 111 mmol/L   CO2 31 22 - 32 mmol/L   Glucose, Bld 108 (H) 65 - 99 mg/dL   BUN 16 6 - 20 mg/dL   Creatinine, Ser 0.74 0.61 - 1.24 mg/dL   Calcium 8.7 (L) 8.9 - 10.3 mg/dL   GFR calc non Af Amer >60 >60 mL/min   GFR calc Af Amer >60 >60 mL/min    Comment: (NOTE) The eGFR has been calculated using the CKD EPI equation. This calculation has not been validated in all clinical situations. eGFR's persistently <60 mL/min signify possible Chronic Kidney Disease.    Anion gap 8 5 - 15  CBC     Status: Abnormal   Collection Time: 10/21/15  3:24 AM  Result Value Ref Range   WBC 13.0 (H) 4.0 - 10.5 K/uL   RBC 2.89 (L) 4.22 - 5.81 MIL/uL   Hemoglobin 8.2 (L) 13.0 - 17.0 g/dL   HCT 26.0 (L) 39.0 - 52.0 %   MCV 90.0 78.0 - 100.0 fL   MCH 28.4 26.0 - 34.0 pg   MCHC 31.5 30.0 - 36.0 g/dL   RDW 15.8 (H) 11.5 - 15.5 %   Platelets 254 150 - 400 K/uL  Glucose, capillary     Status: Abnormal   Collection Time: 10/21/15  4:08 AM  Result Value Ref Range   Glucose-Capillary 114 (H) 65 - 99 mg/dL  Glucose, capillary     Status: Abnormal   Collection Time: 10/21/15  6:18 AM  Result Value Ref Range   Glucose-Capillary 136 (H) 65 - 99 mg/dL   Comment 1 Notify RN   Glucose, capillary  Status: Abnormal   Collection Time: 10/21/15 10:00 AM  Result Value Ref Range   Glucose-Capillary 145 (H) 65 - 99 mg/dL    Imaging: Imaging results have been reviewed  Tele- Paced rhythm  Assessment/Plan:   1. Principal Problem: 2.    S/P mitral valve repair + CABG x4 3. Active Problems: 4.   CORONARY ATHEROSCLEROSIS NATIVE CORONARY ARTERY 5.   Right bundle branch block (RBBB) with left anterior hemiblock 6.   CAD (coronary artery disease) 7.   Mild aortic stenosis 8.   NSTEMI (non-ST elevated myocardial infarction) ([place]) 9.   Unstable angina pectoris ([place]) 10.   Aortic stenosis 11.   S/P CABG x 4 12.   Mitral regurgitation 13.   Aortic insufficiency 14.   Time Spent Directly with Patient:  20 minutes  Length of Stay:  LOS: 9 days   POD # 6 CABG X4, MV annuloplasty for MR, POD # 1 Dual Chamber PTVPM Lovena Le) for post op CHB. Paced rhythm. Mild vol overload getting diuresed. No CP SOB. On coumadin AC. VSS. Agree with OP rehap prior to going home. Nl progression per TCTS. F/U with Dr Jacinta Shoe as OP.   Quay Burow 10/21/2015, 10:18 AM

## 2015-10-21 NOTE — Progress Notes (Signed)
SUBJECTIVE: The patient is doing well today.  At this time, he denies chest pain, shortness of breath, or any new concerns.  Marland Kitchen amLODipine  10 mg Oral Daily  . antiseptic oral rinse  7 mL Mouth Rinse BID  . aspirin EC  81 mg Oral Daily  . atorvastatin  40 mg Oral q1800  . bisacodyl  10 mg Oral Daily   Or  . bisacodyl  10 mg Rectal Daily  . carvedilol  6.25 mg Oral BID  . coumadin book   Does not apply Once  . docusate sodium  200 mg Oral Daily  . enoxaparin (LOVENOX) injection  40 mg Subcutaneous Q24H  . ferrous Q000111Q C-folic acid  1 capsule Oral BID WC  . furosemide  80 mg Intravenous BID  . insulin aspart  0-15 Units Subcutaneous TID WC  . insulin aspart  0-5 Units Subcutaneous QHS  . [START ON 10/22/2015] insulin detemir  26 Units Subcutaneous Daily  . lisinopril  20 mg Oral Daily  . pantoprazole  40 mg Oral Daily  . potassium chloride  40 mEq Oral BID  . sodium chloride flush  3 mL Intravenous Q12H  . sodium chloride flush  3 mL Intravenous Q12H  . warfarin  2.5 mg Oral q1800  . warfarin   Does not apply Once  . Warfarin - Physician Dosing Inpatient   Does not apply q1800   . sodium chloride    . sodium chloride 20 mL/hr at 10/17/15 2006    OBJECTIVE: Physical Exam: Filed Vitals:   10/20/15 1610 10/20/15 1721 10/20/15 2016 10/21/15 0351  BP: 148/70 152/71 147/66 155/74  Pulse: 85 91 96 94  Temp:  98.7 F (37.1 C) 98.2 F (36.8 C) 98.2 F (36.8 C)  TempSrc:  Oral Oral Oral  Resp: 22 20 18 18   Height:      Weight:  249 lb 4.8 oz (113.082 kg)  249 lb 1.6 oz (112.991 kg)  SpO2: 94% 94% 96% 94%    Intake/Output Summary (Last 24 hours) at 10/21/15 1009 Last data filed at 10/21/15 0908  Gross per 24 hour  Intake    240 ml  Output   5710 ml  Net  -5470 ml    Telemetry reveals sinus rhythm/Vpaced  GEN- The patient is well appearing, alert and oriented x 3 today.   Head- normocephalic, atraumatic Eyes-  Sclera clear, conjunctiva pink Ears-  hearing intact Oropharynx- clear Neck- supple, no JVP Lungs- Clear to ausculation bilaterally, normal work of breathing Heart- Regular rate and rhythm, no significant murmurs, no rubs or gallops GI- soft, NT, ND Extremities- no clubbing, cyanosis, or edema Skin- no rash or lesion Psych- euthymic mood, full affect Neuro- no gross deficits appreciated  LABS: Basic Metabolic Panel:  Recent Labs  10/20/15 0243 10/21/15 0324  NA 138 141  K 2.9* 4.1  CL 102 102  CO2 28 31  GLUCOSE 90 108*  BUN 19 16  CREATININE 0.70 0.74  CALCIUM 8.1* 8.7*   CBC:  Recent Labs  10/20/15 0243 10/21/15 0324  WBC 13.0* 13.0*  HGB 8.2* 8.2*  HCT 25.7* 26.0*  MCV 88.6 90.0  PLT 214 254    RADIOLOGY: Dg Chest 2 View 10/21/2015  CLINICAL DATA:  Status post pacemaker insertion. EXAM: CHEST  2 VIEW COMPARISON:  October 19, 2015. FINDINGS: Stable cardiomegaly. Status post coronary artery bypass graft. Left-sided pacemaker is noted with leads in grossly good position. No pneumothorax is noted. Mild bilateral pleural effusions  are noted. Mild bibasilar subsegmental atelectasis is noted. IMPRESSION: Mild bibasilar subsegmental atelectasis with mild bilateral pleural effusions. Electronically Signed   By: Marijo Conception, M.D.   On: 10/21/2015 09:33    ASSESSMENT AND PLAN:  1. NSTEMI, CAD/VHD (MR noted to be mod-severe inter-op)  S/p CABG POD #6  P/o anemia  2. CHB persisted out to post-op  #5 underwent PPM implant yesterday with Dr. Lovena Le    cxr this morning is without PTX    Device interrogation this morning noted intact function, short episode of PAFlutter overnight    Wound dressing is dry, no hematoma, to remove outer dressing upon discharge    Wound check appointment and follow up has been arranged    LUE restrictions discussed with the patient    He asks about swimming in a lake, Dr. Lovena Le was clear to avoid swimming of any kind for 2 months  3. HTN       VSS  4. Fluid OL - his  volume status is improved. Will follow.  5. History of PAFlutter ablation 11/23/12  he had a short episode of Flutter noted by the device last PM     Coumadin started with CTS  6. Hypokalemia  Being replaced  7. Started on coumadin  INR today 1.32     continue management as per CTS    EP service remains available, please recall if needed.  Tommye Standard, PA-C 10/21/2015 10:09 AM   EP Attending  Patient seen and examined. Agree with above. He is doing well, s/p PPM insertion with normal device function. He has no escape rhythm today. CXR is stable. He will continue his post op recovery and will be discharged home as per Dr. Roxy Manns and team. We have followup arranged.  Mikle Bosworth.D.

## 2015-10-21 NOTE — Progress Notes (Signed)
Pt has been very confused and impulsive today. Was found in room by cardiac rehab standing at the sink by himself, condom cath removed, and very disoriented.  He is aware of his confusion and concerned.  Pt notified that we are placing the bed and chair alarms on for his safety and he is reoriented to the call bell.  Dr. Roxy Manns was aware of pt's confusion upon rounding this morning.

## 2015-10-21 NOTE — Progress Notes (Signed)
Utilization review completed.  

## 2015-10-21 NOTE — Progress Notes (Signed)
Pacing wires removed without complication.  Bedrest up at 1:00pm.  Pt tolerated well.

## 2015-10-21 NOTE — NC FL2 (Signed)
[place] LEVEL OF CARE SCREENING TOOL     IDENTIFICATION  Patient Name: Christian Rasmussen Birthdate: 1940/07/20 Sex: male Admission Date (Current Location): 10/12/2015  [facility] and [place] Number:  Herbalist and Address:  [facility] [place] [address] [place], [place] [address]      Provider Number: [id number]  Attending Physician Name and Address:  Rexene Alberts, MD  Relative Name and Phone Number:       Current Level of Care: Hospital Recommended Level of Care: Nursing Facility Prior Approval Number:    Date Approved/Denied:   PASRR Number: [id number]  Discharge Plan: SNF    Current Diagnoses: Patient Active Problem List   Diagnosis Date Noted  . 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 S/P mitral valve repair + CABG x4 10/15/2015  . Mitral regurgitation 10/15/2015  . Aortic insufficiency 10/15/2015  . Aortic stenosis 10/14/2015  . NSTEMI (non-ST elevated myocardial infarction) ([place]) 10/12/2015  . Unstable angina pectoris ([place]) 10/12/2015  . Mild aortic stenosis 04/13/2013  . Atrial flutter ([place]) 11/09/2012  . Snoring 11/09/2012  . Right bundle branch block (RBBB) with left anterior hemiblock   . Hypertension   . CAD (coronary artery disease)   . DYSLIPIDEMIA 04/11/2008  . CORONARY ATHEROSCLEROSIS NATIVE CORONARY ARTERY 04/11/2008  . PERCUTANEOUS TRANSLUMINAL CORONARY ANGIOPLASTY, HX OF 04/11/2008    Orientation RESPIRATION BLADDER Height & Weight     Self, Time, Situation, Place  Normal Indwelling catheter Weight: 249 lb 1.6 oz (112.991 kg) Height:  5\' 11"  (180.3 cm)  BEHAVIORAL SYMPTOMS/MOOD NEUROLOGICAL BOWEL NUTRITION STATUS   (none)  (None) Continent Diet (Heart Health)  AMBULATORY STATUS COMMUNICATION OF NEEDS Skin   Extensive Assist Verbally Surgical wounds                       Personal Care Assistance Level of Assistance  Bathing, Feeding, Dressing Bathing Assistance: Limited assistance Feeding assistance:  Independent Dressing Assistance: Limited assistance     Functional Limitations Info  Sight, Hearing, Speech Sight Info: Adequate Hearing Info: Adequate Speech Info: Adequate    SPECIAL CARE FACTORS FREQUENCY  PT (By licensed PT), OT (By licensed OT)     PT Frequency: 5/ week OT Frequency: 5/ week            Contractures Contractures Info: Not present    Additional Factors Info  Code Status, Allergies, Insulin Sliding Scale Code Status Info: Full Allergies Info: NKDA   Insulin Sliding Scale Info: insulin aspart (novoLOG) injection 0-15 Units Dose: 0-15 Units Freq: 3 times daily with meals Route: [place]   insulin aspart (novoLOG) injection 0-5 Units Dose: 0-5 Units Freq: Daily at bedtime Route: [place]       Current Medications (10/21/2015):  This is the current hospital active medication list Current Facility-Administered Medications  Medication Dose Route Frequency Provider Last Rate Last Dose  . 0.9 %  sodium chloride infusion  250 mL Intravenous Continuous Rexene Alberts, MD   0 mL at 10/16/15 0600  . 0.9 %  sodium chloride infusion   Intravenous Continuous Rexene Alberts, MD 20 mL/hr at 10/17/15 2006    . 0.9 %  sodium chloride infusion  250 mL Intravenous PRN Rexene Alberts, MD      . acetaminophen (TYLENOL) tablet 325-650 mg  325-650 mg Oral Q4H PRN Evans Lance, MD      . amLODipine (NORVASC) tablet 10 mg  10 mg Oral Daily  Rexene Alberts, MD   10 mg at 10/21/15 0802  . antiseptic oral rinse (CPC / CETYLPYRIDINIUM CHLORIDE 0.05%) solution 7 mL  7 mL Mouth Rinse BID Rexene Alberts, MD   7 mL at 10/20/15 2200  . aspirin EC tablet 81 mg  81 mg Oral Daily Rexene Alberts, MD   81 mg at 10/21/15 0800  . atorvastatin (LIPITOR) tablet 40 mg  40 mg Oral q1800 Rexene Alberts, MD   40 mg at 10/20/15 1835  . bisacodyl (DULCOLAX) EC tablet 10 mg  10 mg Oral Daily Rexene Alberts, MD   10 mg at 10/19/15 1014   Or  . bisacodyl (DULCOLAX) suppository 10 mg  10 mg Rectal Daily  Rexene Alberts, MD      . carvedilol (COREG) tablet 6.25 mg  6.25 mg Oral BID Rexene Alberts, MD   6.25 mg at 10/21/15 1209  . docusate sodium (COLACE) capsule 200 mg  200 mg Oral Daily Rexene Alberts, MD   200 mg at 10/21/15 0801  . enoxaparin (LOVENOX) injection 40 mg  40 mg Subcutaneous Q24H Rexene Alberts, MD   40 mg at 10/21/15 0800  . ferrous Q000111Q C-folic acid (TRINSICON / FOLTRIN) capsule 1 capsule  1 capsule Oral BID WC Ivin Poot, MD   1 capsule at 10/21/15 0801  . furosemide (LASIX) injection 80 mg  80 mg Intravenous BID Rexene Alberts, MD   80 mg at 10/21/15 0801  . insulin aspart (novoLOG) injection 0-15 Units  0-15 Units Subcutaneous TID WC Ivin Poot, MD   2 Units at 10/21/15 [phone]  . insulin aspart (novoLOG) injection 0-5 Units  0-5 Units Subcutaneous QHS Ivin Poot, MD   0 Units at 10/19/15 2200  . [START ON 10/22/2015] insulin detemir (LEVEMIR) injection 26 Units  26 Units Subcutaneous Daily Rexene Alberts, MD      . lisinopril (PRINIVIL,ZESTRIL) tablet 20 mg  20 mg Oral Daily Rexene Alberts, MD   20 mg at 10/21/15 0802  . ondansetron (ZOFRAN) injection 4 mg  4 mg Intravenous Q6H PRN Rexene Alberts, MD      . ondansetron [facility]) injection 4 mg  4 mg Intravenous Q6H PRN Evans Lance, MD      . pantoprazole (PROTONIX) EC tablet 40 mg  40 mg Oral Daily Rexene Alberts, MD   40 mg at 10/21/15 1000  . potassium chloride SA (K-DUR,KLOR-CON) CR tablet 40 mEq  40 mEq Oral BID Rexene Alberts, MD   40 mEq at 10/21/15 0802  . sodium chloride flush (NS) 0.9 % injection 3 mL  3 mL Intravenous Q12H Rexene Alberts, MD   3 mL at 10/21/15 1000  . sodium chloride flush (NS) 0.9 % injection 3 mL  3 mL Intravenous PRN Rexene Alberts, MD      . sodium chloride flush (NS) 0.9 % injection 3 mL  3 mL Intravenous Q12H Rexene Alberts, MD   3 mL at 10/19/15 2235  . sodium chloride flush (NS) 0.9 % injection 3 mL  3 mL Intravenous PRN Rexene Alberts, MD      .  traMADol Veatrice Bourbon) tablet 50-100 mg  50-100 mg Oral Q4H PRN Rexene Alberts, MD   100 mg at 10/17/15 1120  . warfarin (COUMADIN) tablet 2.5 mg  2.5 mg Oral q1800 Rexene Alberts, MD   2.5 mg at 10/20/15 1834  . Warfarin - Physician  Dosing Inpatient   Does not apply q1800 Rexene Alberts, MD         Discharge Medications: Please see discharge summary for a list of discharge medications.  Relevant Imaging Results:  Relevant Lab Results:   Additional Information SSN: [id number]  Samule Dry, LCSW

## 2015-10-21 NOTE — Progress Notes (Signed)
CARDIAC REHAB PHASE I   PRE:  Rate/Rhythm: 111 pacing    BP: sitting 113/63    SaO2: 95 RA  MODE:  Ambulation: 150 ft   POST:  Rate/Rhythm: 120 pacing    BP: sitting 126/78     SaO2: 94 RA  Pt found at sink with condom catheter in sink, stating "I don't know what I'm doing. Where am I?" Pt feels like he lost a period of time. Cleaned pt up and encouraged him to walk. Used RW, assist x2, gait belt. Followed with rollator and pt had to sit x1 due to fatigue. Did better standing upright. Able to control his pace/posture with verbal reminders. x1 sitting rest and x1 standing rest. To bed. Pt concerned about his confusion. RN aware. Place pt on bed alarm. Will f/u tomorrow as x2. [place], ACSM 10/21/2015 2:12 PM

## 2015-10-22 LAB — BASIC METABOLIC PANEL
ANION GAP: 8 (ref 5–15)
BUN: 24 mg/dL — ABNORMAL HIGH (ref 6–20)
CHLORIDE: 102 mmol/L (ref 101–111)
CO2: 30 mmol/L (ref 22–32)
Calcium: 9.6 mg/dL (ref 8.9–10.3)
Creatinine, Ser: 0.82 mg/dL (ref 0.61–1.24)
GFR calc Af Amer: >60 mL/min (ref >60)
Glucose, Bld: 117 mg/dL — ABNORMAL HIGH (ref 65–99)
POTASSIUM: 4.4 mmol/L (ref 3.5–5.1)
Sodium: 140 mmol/L (ref 135–145)

## 2015-10-22 LAB — GLUCOSE, CAPILLARY
GLUCOSE-CAPILLARY: 157 mg/dL — AB (ref 65–99)
Glucose-Capillary: 152 mg/dL — ABNORMAL HIGH (ref 65–99)
Glucose-Capillary: 114 mg/dL — ABNORMAL HIGH (ref 65–99)
Glucose-Capillary: 138 mg/dL — ABNORMAL HIGH (ref 65–99)

## 2015-10-22 LAB — URINALYSIS, ROUTINE W REFLEX MICROSCOPIC
BILIRUBIN URINE: NEGATIVE
Glucose, UA: NEGATIVE mg/dL
Hgb urine dipstick: NEGATIVE
KETONES UR: NEGATIVE mg/dL
LEUKOCYTES UA: NEGATIVE
NITRITE: NEGATIVE
PH: 7 (ref 5.0–8.0)
Protein, ur: NEGATIVE mg/dL
SPECIFIC GRAVITY, URINE: 1.022 (ref 1.005–1.030)

## 2015-10-22 LAB — CBC
HEMATOCRIT: 28.8 % — AB (ref 39.0–52.0)
Hemoglobin: 9.1 g/dL — ABNORMAL LOW (ref 13.0–17.0)
MCH: 29 pg (ref 26.0–34.0)
MCHC: 31.6 g/dL (ref 30.0–36.0)
MCV: 91.7 fL (ref 78.0–100.0)
Platelets: 323 10*3/uL (ref 150–400)
RBC: 3.14 MIL/uL — AB (ref 4.22–5.81)
RDW: 16 % — ABNORMAL HIGH (ref 11.5–15.5)
WBC: 17.6 10*3/uL — AB (ref 4.0–10.5)

## 2015-10-22 LAB — PROTIME-INR
INR: 1.31 (ref 0.00–1.49)
PROTHROMBIN TIME: 16.4 s — AB (ref 11.6–15.2)

## 2015-10-22 MED ORDER — WARFARIN SODIUM 5 MG PO TABS
5.0000 mg | ORAL_TABLET | Freq: Every day | ORAL | Status: DC
  Administered 2015-10-22: 5 mg via ORAL
  Filled 2015-10-22: qty 1

## 2015-10-22 MED ORDER — CARVEDILOL 12.5 MG PO TABS
12.5000 mg | ORAL_TABLET | Freq: Two times a day (BID) | ORAL | Status: DC
  Administered 2015-10-22 – 2015-10-24 (×4): 12.5 mg via ORAL
  Filled 2015-10-22 (×4): qty 1

## 2015-10-22 MED ORDER — METFORMIN HCL 500 MG PO TABS
1000.0000 mg | ORAL_TABLET | Freq: Two times a day (BID) | ORAL | Status: DC
  Administered 2015-10-22 – 2015-10-24 (×5): 1000 mg via ORAL
  Filled 2015-10-22 (×5): qty 2

## 2015-10-22 MED ORDER — FUROSEMIDE 40 MG PO TABS
40.0000 mg | ORAL_TABLET | Freq: Two times a day (BID) | ORAL | Status: DC
  Administered 2015-10-23 – 2015-10-24 (×3): 40 mg via ORAL
  Filled 2015-10-22 (×3): qty 1

## 2015-10-22 NOTE — Progress Notes (Signed)
CARDIAC REHAB PHASE I   PRE:  Rate/Rhythm: 99 paced    BP: sitting 131/77    SaO2: 93 RA  MODE:  Ambulation: 350 ft   POST:  Rate/Rhythm: 119 pacing    BP: sitting 155/77     SaO2: 96 RA  Pt is much improved today. Able to recall sternal precautions getting out of bed, mostly independent standing. Used RW, fairly steady. Gave pt reminders to stand tall and keep close. Likes to walk quickly. x2 standing rest stops. No major c/o. To recliner. No major confusion noted.  VSS. Will f/u as x1. Encouraged x1 more walk today as he walked early am. [id number]  [place], ACSM 10/22/2015 11:08 AM

## 2015-10-22 NOTE — Clinical Social Work Note (Addendum)
CSW presented bed offers. Patient would like placement with [facility], if not accepted into inpatient rehab. CSW called Tami with [facility] and made her aware. CSW continuing to follow for discharge needs.   [employer], LCSW [phone]

## 2015-10-22 NOTE — Progress Notes (Addendum)
[address]       [place] [address]             [phone]      2 Days Post-Op Procedure(s) (LRB): Pacemaker Implant (N/A) Subjective: Feels "normal" for the first time since surgery  Objective: Vital signs in last 24 hours: Temp:  [97.4 F (36.3 C)-98.4 F (36.9 C)] 98.3 F (36.8 C) (06/14 0457) Pulse Rate:  [70-106] 99 (06/14 0457) Cardiac Rhythm:  [-] Ventricular paced;Bundle branch block (06/13 1900) Resp:  [17-18] 18 (06/14 0457) BP: (117-147)/(56-84) 122/71 mmHg (06/14 0457) SpO2:  [93 %-99 %] 99 % (06/14 0457) Weight:  [242 lb 11.2 oz (110.088 kg)] 242 lb 11.2 oz (110.088 kg) (06/14 0310)  Hemodynamic parameters for last 24 hours:    Intake/Output from previous day: 06/13 0701 - 06/14 0700 In: 240 [P.O.:240] Out: 3875 [Urine:3875] Intake/Output this shift:    General appearance: alert, cooperative and no distress Heart: regular rate and rhythm and no murmur Lungs: clear to auscultation bilaterally Abdomen: benign Extremities: + BLE edema Wound: incis healing well  Lab Results:  Recent Labs  10/21/15 0324 10/22/15 0244  WBC 13.0* 17.6*  HGB 8.2* 9.1*  HCT 26.0* 28.8*  PLT 254 323   BMET:  Recent Labs  10/21/15 0324 10/22/15 0244  NA 141 140  K 4.1 4.4  CL 102 102  CO2 31 30  GLUCOSE 108* 117*  BUN 16 24*  CREATININE 0.74 0.82  CALCIUM 8.7* 9.6    PT/INR:  Recent Labs  10/22/15 0244  LABPROT 16.4*  INR 1.31   ABG    Component Value Date/Time   PHART 7.415 10/16/2015 0530   HCO3 23.2 10/16/2015 0530   TCO2 26 10/18/2015 1645   ACIDBASEDEF 1.0 10/16/2015 0530   O2SAT 97.0 10/16/2015 0530   CBG (last 3)   Recent Labs  10/21/15 1636 10/21/15 2133 10/22/15 0614  GLUCAP 123* 127* 152*    Meds Scheduled Meds: . amLODipine  10 mg Oral Daily  . antiseptic oral rinse  7 mL Mouth Rinse BID  . aspirin EC  81 mg Oral Daily  . atorvastatin  40 mg Oral q1800  . bisacodyl  10 mg Oral Daily   Or  .  bisacodyl  10 mg Rectal Daily  . carvedilol  6.25 mg Oral BID  . docusate sodium  200 mg Oral Daily  . enoxaparin (LOVENOX) injection  40 mg Subcutaneous Q24H  . ferrous Q000111Q C-folic acid  1 capsule Oral BID WC  . furosemide  80 mg Intravenous BID  . [START ON 10/23/2015] furosemide  40 mg Oral BID  . insulin aspart  0-15 Units Subcutaneous TID WC  . insulin aspart  0-5 Units Subcutaneous QHS  . insulin detemir  26 Units Subcutaneous Daily  . lisinopril  20 mg Oral Daily  . pantoprazole  40 mg Oral Daily  . potassium chloride  40 mEq Oral BID  . sodium chloride flush  3 mL Intravenous Q12H  . sodium chloride flush  3 mL Intravenous Q12H  . warfarin  5 mg Oral q1800  . Warfarin - Physician Dosing Inpatient   Does not apply q1800   Continuous Infusions: . sodium chloride    . sodium chloride 20 mL/hr at 10/17/15 2006   PRN Meds:.sodium chloride, acetaminophen, ondansetron (ZOFRAN) IV, ondansetron (ZOFRAN) IV, sodium chloride flush, sodium chloride flush, traMADol  Xrays Dg Chest 2 View  10/21/2015  CLINICAL DATA:  Status post  pacemaker insertion. EXAM: CHEST  2 VIEW COMPARISON:  October 19, 2015. FINDINGS: Stable cardiomegaly. Status post coronary artery bypass graft. Left-sided pacemaker is noted with leads in grossly good position. No pneumothorax is noted. Mild bilateral pleural effusions are noted. Mild bibasilar subsegmental atelectasis is noted. IMPRESSION: Mild bibasilar subsegmental atelectasis with mild bilateral pleural effusions. Electronically Signed   By: Marijo Conception, M.D.   On: 10/21/2015 09:33    Assessment/Plan: S/P Procedure(s) (LRB): Pacemaker Implant (N/A)  1 Conts to improve 2 pacing, cont management per EP 3 conts with volume overload, improving, diuresed >4 liters yesterday, change to po 4 cbg's controlled pretty well, will restart metformin and d/c insulin for now 5 cont coumadin- 5 mg, on lovenox for bridge 6 increased leukocytosis, no  fevers,   LOS: 10 days    GOLD,WAYNE E 10/22/2015  I have seen and examined the patient and agree with the assessment and plan as outlined.  Possibly ready for d/c home 2-3 days.  Rexene Alberts, MD 10/22/2015 8:43 AM

## 2015-10-22 NOTE — Progress Notes (Signed)
Pt's bilateral medial leg incisions oozing serosanguinous fluid.  Dressing has needed changing multiple times yesterday and twice today.  Evonnie Pat, PA is aware.  Instructions are dressing changes PRN.  Will notify if active bleeding.

## 2015-10-23 DIAGNOSIS — R5381 Other malaise: Secondary | ICD-10-CM

## 2015-10-23 LAB — GLUCOSE, CAPILLARY
GLUCOSE-CAPILLARY: 138 mg/dL — AB (ref 65–99)
GLUCOSE-CAPILLARY: 154 mg/dL — AB (ref 65–99)
Glucose-Capillary: 127 mg/dL — ABNORMAL HIGH (ref 65–99)
Glucose-Capillary: 125 mg/dL — ABNORMAL HIGH (ref 65–99)

## 2015-10-23 LAB — URINE CULTURE

## 2015-10-23 LAB — CBC
HCT: 26 % — ABNORMAL LOW (ref 39.0–52.0)
Hemoglobin: 8.1 g/dL — ABNORMAL LOW (ref 13.0–17.0)
MCH: 28.4 pg (ref 26.0–34.0)
MCHC: 31.2 g/dL (ref 30.0–36.0)
MCV: 91.2 fL (ref 78.0–100.0)
PLATELETS: 308 10*3/uL (ref 150–400)
RBC: 2.85 MIL/uL — ABNORMAL LOW (ref 4.22–5.81)
RDW: 15.9 % — AB (ref 11.5–15.5)
WBC: 14.1 10*3/uL — AB (ref 4.0–10.5)

## 2015-10-23 LAB — BASIC METABOLIC PANEL
ANION GAP: 9 (ref 5–15)
BUN: 28 mg/dL — ABNORMAL HIGH (ref 6–20)
CALCIUM: 9.3 mg/dL (ref 8.9–10.3)
CO2: 29 mmol/L (ref 22–32)
Chloride: 100 mmol/L — ABNORMAL LOW (ref 101–111)
Creatinine, Ser: 0.82 mg/dL (ref 0.61–1.24)
Glucose, Bld: 139 mg/dL — ABNORMAL HIGH (ref 65–99)
Potassium: 5.1 mmol/L (ref 3.5–5.1)
SODIUM: 138 mmol/L (ref 135–145)

## 2015-10-23 LAB — PROTIME-INR
INR: 1.33 (ref 0.00–1.49)
Prothrombin Time: 16.6 s — ABNORMAL HIGH (ref 11.6–15.2)

## 2015-10-23 MED ORDER — WARFARIN SODIUM 3 MG PO TABS
6.0000 mg | ORAL_TABLET | Freq: Every day | ORAL | Status: DC
  Administered 2015-10-23: 6 mg via ORAL
  Filled 2015-10-23: qty 2

## 2015-10-23 NOTE — Consult Note (Signed)
Physical Medicine and Rehabilitation Consult  Reason for Consult: Debility Referring Physician: Dr. Roxy Manns   HPI: Christian Rasmussen is a 75 y.o. male with history of  HTN, T2DM,  A fib s/p ablation, CAD with 3 week history of DOE and was found to have RBBB with mildly elevated cardiac enzymes. He was admitted via [facility] on 06/04 and cardiac cath revealed severe multivessel CAD with calcified AV. He was evaluated by Dr. Roxy Manns and underwent CABG X 4 with mitral valve repair on 10/15/15. Post op course significant for ABLA, fluid overload, development of complete HB as well as A flutter. He was treated with IV diuresis and started on coumadin.  PPM placed by Dr. Lovena Le on 06/12 and post procedure with reports of delirum.  Therapy ongoing and notes that mentation is improving but he continue to have poor safety awareness with balance deficits and debility. CIR recommended by MD and Rehab team.     Review of Systems  HENT: Negative for hearing loss.   Eyes: Negative for blurred vision.  Respiratory: Positive for shortness of breath. Negative for cough.   Cardiovascular: Positive for chest pain (chest tenderness) and leg swelling.  Gastrointestinal: Negative for heartburn, nausea and abdominal pain.  Genitourinary: Negative for dysuria and urgency.  Musculoskeletal: Negative for myalgias, back pain and neck pain.  Neurological: Negative for dizziness, focal weakness and headaches.  Psychiatric/Behavioral: Negative for memory loss. The patient is not nervous/anxious and does not have insomnia.       Past Medical History  Diagnosis Date  . CAD (coronary artery disease)     status post Taxus stent patency of RCA 2004, Cardiolite negative for ischemia in 2010.  Marland Kitchen Dyslipidemia   . Hypertension   . Right bundle branch block (RBBB) with left anterior hemiblock   . Typical atrial flutter ([place])     s/p ablation 11-23-2012 by Dr Rayann Heman  . Overweight   . Diabetes mellitus ([place])   . Snores   . History  of kidney stones   . Aortic stenosis 10/14/2015  . 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 Mitral regurgitation 10/15/2015    Moderate-severe by intra-operative TEE  . Aortic insufficiency 10/15/2015    mild (1+/2+) by TEE    Past Surgical History  Procedure Laterality Date  . Ankle surgery Left     total ankle replacement  . Ablation  11-23-2012    s/p cavotricuspid isthmus ablation by Dr Rayann Heman  . Coronary angioplasty      3 stents  . Cataract extraction Right   . Cataract extraction w/phaco Left 09/13/2013    Procedure: CATARACT EXTRACTION PHACO AND INTRAOCULAR LENS PLACEMENT (IOC);  Surgeon: Tonny Branch, MD;  Location: [facility];  Service: Ophthalmology;  Laterality: Left;  CDE 9.38  . Atrial flutter ablation N/A 11/23/2012    Procedure: ATRIAL FLUTTER ABLATION;  Surgeon: Thompson Grayer, MD;  Location: [facility] CATH LAB;  Service: Cardiovascular;  Laterality: N/A;  . Colonoscopy N/A 10/10/2014    Procedure: COLONOSCOPY;  Surgeon: Rogene Houston, MD;  Location: [facility];  Service: Endoscopy;  Laterality: N/A;  830  . Cardiac catheterization N/A 10/13/2015    Procedure:  Left Heart Cath and Coronary Angiography;  Surgeon: Peter M Martinique, MD;  Location: [place] CV LAB;  Service: Cardiovascular;  Laterality: N/A;  . Coronary artery bypass graft N/A 10/15/2015    Procedure: CORONARY ARTERY BYPASS GRAFTING (CABG) X4 LIMA-LAD; SEQ SVG-PD-PL; SVG-OM1 ENDOSCOPIC GREATER SAPHENOUS VEIN HARVEST(EVH) BILAT LOWER EXTREM;  Surgeon: Rexene Alberts, MD;  Location: [place];  Service: Open Heart Surgery;  Laterality: N/A;  . Tee without cardioversion N/A 10/15/2015    Procedure: TRANSESOPHAGEAL  ECHOCARDIOGRAM (TEE);  Surgeon: Rexene Alberts, MD;  Location: [place];  Service: Open Heart Surgery;  Laterality: N/A;  . Mitral valve repair N/A 10/15/2015    Procedure: MITRAL VALVE REPAIR (MVR), # 28 MEMO 3-D RING ANNULOPLASTY AND COMPLEX VALVE REPAIR;  Surgeon: Rexene Alberts, MD;  Location: [place];  Service: Open Heart Surgery;  Laterality: N/A;  . Ep implantable device N/A 10/20/2015    Procedure: Pacemaker Implant;  Surgeon: Evans Lance, MD;  Location: [place] CV LAB;  Service: Cardiovascular;  Laterality: N/A;    Family History  Problem Relation Age of Onset  . Other Father 12    Died from MI.  . Other Mother     alive & well  . Leukemia Brother 96    died    Social History:   Married. 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 Sedentary.   He reports that he quit smoking about 30 years ago. His smoking use included Cigarettes. He started smoking about 59 years ago. He has a 28 pack-year smoking history. He has never used smokeless tobacco. He reports that he drinks alcohol occasionally. He reports that he does not use illicit drugs.    Allergies: No Known Allergies    Medications Prior to Admission  Medication Sig Dispense Refill  . aspirin EC 81 MG tablet Take 1 tablet (81 mg total) by mouth daily. 90 tablet 3  . ASTAXANTHIN PO Take 10 mg by mouth daily.    Marland Kitchen atorvastatin (LIPITOR) 40 MG tablet Take 1 tablet by mouth daily.    . Berberine Chloride POWD Take 1,200 mg by mouth daily.    . carvedilol (COREG) 6.25 MG tablet TAKE 1 TABLET TWICE DAILY 180 tablet 3  . chlorthalidone (HYGROTON) 25 MG tablet Take 25 mg by mouth daily.    . Coenzyme Q10 (CO Q10) 100 MG CAPS Take 1 tablet by mouth daily.    . fish oil-omega-3 fatty acids 1000 MG capsule Take 2 g by mouth daily.    Marland Kitchen KRILL OIL 1000 MG CAPS Take 1 capsule by mouth daily.     Marland Kitchen lisinopril (PRINIVIL,ZESTRIL) 20 MG tablet Take 1 tablet (20 mg total) by mouth daily. 90 tablet 3  . metFORMIN (GLUCOPHAGE) 500 MG  tablet Take 1,000 mg by mouth 2 (two) times daily.   2  . Multiple Vitamin (MULITIVITAMIN WITH MINERALS) TABS Take 1 tablet by mouth daily.    Marland Kitchen OVER THE COUNTER MEDICATION Take 1 capsule by mouth 3 (three) times daily. Glucose Essentials- Chromium, [place] kino tree extract, Gymnema, Tursaline Extract,Vanadyl Sulfate, Banaba Extract      Home: Home Living Family/patient expects to be discharged to:: Private residence Living Arrangements: Spouse/significant other Available Help at Discharge: Family, Available 24 hours/day Type of Home: House Home Access: Stairs to enter [employer] of Steps: 4 Entrance Stairs-Rails: Right Home Layout: One level Bathroom Shower/Tub: Chiropodist: Standard Bathroom Accessibility: Yes Home Equipment: Bedside commode, Wheelchair - manual, Tub bench  Functional  History: Prior Function Level of Independence: Independent Functional Status:  Mobility: Bed Mobility Overal bed mobility: Needs Assistance Bed Mobility: Rolling, Sidelying to Sit Rolling: Min assist Sidelying to sit: Min assist General bed mobility comments: Needed assist for log roll for sternal precautions needing assist for elevation of trunk.  Pt needed incr cues for sternal precautions as he forgets.  Transfers Overall transfer level: Needs assistance Equipment used: Rolling walker (2 wheeled) Transfers: Sit to/from Stand Sit to Stand: Min assist, +2 safety/equipment, From elevated surface General transfer comment: Pt needed max cues to hold pillow with repetition of cues constantly or pt would try to use hands.  Pt is able to power up with LEs with constant cues.   Ambulation/Gait Ambulation/Gait assistance: Mod assist, Min assist, +2 safety/equipment Ambulation Distance (Feet): 280 Feet (100 feet, 100 feet then 80 feet.  ) Assistive device: Rolling walker (2 wheeled) Gait Pattern/deviations: Step-through pattern, Decreased stride length, Antalgic, Trunk  flexed, Wide base of support, Drifts right/left General Gait Details: Pt with better gait overall today but still needs cues verbally and tactilly for balance.  As pt fatigues, he will drift to left as well as loses his postural stability.  Also more impulsivity and decr safety as he fatigues.  Wife pushed chair behind pt . Pt took two seated rest breaks.   Gait velocity interpretation: <1.8 ft/sec, indicative of risk for recurrent falls    ADL:    Cognition: Cognition Overall Cognitive Status: Impaired/Different from baseline Orientation Level: Oriented to person, Oriented to place, Oriented to time, Oriented to situation Cognition Arousal/Alertness: Awake/alert Behavior During Therapy: Anxious, Impulsive Overall Cognitive Status: Impaired/Different from baseline Area of Impairment: Orientation, Memory, Following commands, Safety/judgement, Awareness, Problem solving Orientation Level: Disoriented to, Time, Situation, Place Memory: Decreased short-term memory Following Commands: Follows one step commands inconsistently Safety/Judgement: Decreased awareness of safety Awareness: Intellectual Problem Solving: Difficulty sequencing, Requires verbal cues General Comments: Pt confused.  Needs repetition and cues.    Blood pressure 133/57, pulse 90, temperature 97.4 F (36.3 C), temperature source Oral, resp. rate 18, height 5\' 11"  (1.803 m), weight 108.41 kg (239 lb), SpO2 96 %. Physical Exam  Vitals reviewed. Constitutional: He is oriented to person, place, and time. He appears well-developed and well-nourished.  He was sitting at EOB and completing his breakfast.   HENT:  Head: Normocephalic and atraumatic.  Mouth/Throat: Oropharynx is clear and moist.  Eyes: Conjunctivae and EOM are normal. Pupils are equal, round, and reactive to light.  Neck: Normal range of motion. Neck supple.  Cardiovascular: Normal rate.   Respiratory: Effort normal and breath sounds normal. No stridor. No  respiratory distress. He has no wheezes.  GI: Soft. Bowel sounds are normal. He exhibits no distension. There is no tenderness.  Musculoskeletal: He exhibits edema.  1+ edema BLE.   Neurological: He is alert and oriented to person, place, and time.  Cognitively appropriate. UE nearly 5/5. LE: 4- prox to 5/5 distal. No sensory changes.   Skin: Skin is warm and dry.  Bruising on right thigh. Graft harvest sites dressed.  Psychiatric: He has a normal mood and affect. His behavior is normal.    Results for orders placed or performed during the hospital encounter of 10/12/15 (from the past 24 hour(s))  Urinalysis, Routine w reflex microscopic (not at [facility])     Status: None   Collection Time: 10/22/15  8:54 AM  Result Value Ref Range   Color, Urine YELLOW YELLOW   APPearance CLEAR CLEAR  Specific Gravity, Urine 1.022 1.005 - 1.030   pH 7.0 5.0 - 8.0   Glucose, UA NEGATIVE NEGATIVE mg/dL   Hgb urine dipstick NEGATIVE NEGATIVE   Bilirubin Urine NEGATIVE NEGATIVE   Ketones, ur NEGATIVE NEGATIVE mg/dL   Protein, ur NEGATIVE NEGATIVE mg/dL   Nitrite NEGATIVE NEGATIVE   Leukocytes, UA NEGATIVE NEGATIVE  Glucose, capillary     Status: Abnormal   Collection Time: 10/22/15 11:34 AM  Result Value Ref Range   Glucose-Capillary 157 (H) 65 - 99 mg/dL   Comment 1 Notify RN    Comment 2 Document in Chart   Glucose, capillary     Status: Abnormal   Collection Time: 10/22/15  4:08 PM  Result Value Ref Range   Glucose-Capillary 114 (H) 65 - 99 mg/dL   Comment 1 Notify RN    Comment 2 Document in Chart   Glucose, capillary     Status: Abnormal   Collection Time: 10/22/15  9:33 PM  Result Value Ref Range   Glucose-Capillary 138 (H) 65 - 99 mg/dL   Comment 1 Notify RN    Comment 2 Document in Chart   Protime-INR     Status: Abnormal   Collection Time: 10/23/15  3:39 AM  Result Value Ref Range   Prothrombin Time 16.6 (H) 11.6 - 15.2 seconds   INR 1.33 0.00 - 1.49  CBC     Status: Abnormal    Collection Time: 10/23/15  3:39 AM  Result Value Ref Range   WBC 14.1 (H) 4.0 - 10.5 K/uL   RBC 2.85 (L) 4.22 - 5.81 MIL/uL   Hemoglobin 8.1 (L) 13.0 - 17.0 g/dL   HCT 26.0 (L) 39.0 - 52.0 %   MCV 91.2 78.0 - 100.0 fL   MCH 28.4 26.0 - 34.0 pg   MCHC 31.2 30.0 - 36.0 g/dL   RDW 15.9 (H) 11.5 - 15.5 %   Platelets 308 150 - 400 K/uL  Basic metabolic panel     Status: Abnormal   Collection Time: 10/23/15  3:39 AM  Result Value Ref Range   Sodium 138 135 - 145 mmol/L   Potassium 5.1 3.5 - 5.1 mmol/L   Chloride 100 (L) 101 - 111 mmol/L   CO2 29 22 - 32 mmol/L   Glucose, Bld 139 (H) 65 - 99 mg/dL   BUN 28 (H) 6 - 20 mg/dL   Creatinine, Ser 0.82 0.61 - 1.24 mg/dL   Calcium 9.3 8.9 - 10.3 mg/dL   GFR calc non Af Amer >60 >60 mL/min   GFR calc Af Amer >60 >60 mL/min   Anion gap 9 5 - 15  Glucose, capillary     Status: Abnormal   Collection Time: 10/23/15  6:05 AM  Result Value Ref Range   Glucose-Capillary 127 (H) 65 - 99 mg/dL   Dg Chest 2 View  10/21/2015  CLINICAL DATA:  Status post pacemaker insertion. EXAM: CHEST  2 VIEW COMPARISON:  October 19, 2015. FINDINGS: Stable cardiomegaly. Status post coronary artery bypass graft. Left-sided pacemaker is noted with leads in grossly good position. No pneumothorax is noted. Mild bilateral pleural effusions are noted. Mild bibasilar subsegmental atelectasis is noted. IMPRESSION: Mild bibasilar subsegmental atelectasis with mild bilateral pleural effusions. Electronically Signed   By: Marijo Conception, M.D.   On: 10/21/2015 09:33    Assessment/Plan: Diagnosis: weakness, delirium after CABG x 4 1. Does the need for close, 24 hr/day medical supervision in concert with the patient's rehab needs make it unreasonable  for this patient to be served in a less intensive setting? Potentially 2. Co-Morbidities requiring supervision/potential complications: cad, AS,AI 3. Due to bladder management, bowel management, safety, skin/wound care, disease management,  medication administration, pain management and patient education, does the patient require 24 hr/day rehab nursing? Potentially 4. Does the patient require coordinated care of a physician, rehab nurse, PT (1-2 hrs/day, 5 days/week) and OT (1-2 hrs/day, 5 days/week) to address physical and functional deficits in the context of the above medical diagnosis(es)? Potentially Addressing deficits in the following areas: balance, endurance, locomotion, strength, transferring, bowel/bladder control, bathing, dressing, feeding, grooming and toileting 5. Can the patient actively participate in an intensive therapy program of at least 3 hrs of therapy per day at least 5 days per week? Yes 6. The potential for patient to make measurable gains while on inpatient rehab is fair 7. Anticipated functional outcomes upon discharge from inpatient rehab are n/a  with PT, n/a with OT, n/a with SLP. 8. Estimated rehab length of stay to reach the above functional goals is: n/a 9. Does the patient have adequate social supports and living environment to accommodate these discharge functional goals? Yes 10. Anticipated D/C setting: Home 11. Anticipated post D/C treatments: [place] therapy 12. Overall Rehab/Functional Prognosis: excellent  RECOMMENDATIONS: This patient's condition is appropriate for continued rehabilitative care in the following setting: [facility] Therapy Patient has agreed to participate in recommended program. Yes Note that insurance prior authorization may be required for reimbursement for recommended care.  Comment: Pt improving. Stood and balanced without assistance today for me in room. Wife is at home  Meredith Staggers, MD, [place] [facility] 10/23/2015     10/23/2015

## 2015-10-23 NOTE — Care Management Important Message (Signed)
Important Message  Patient Details  Name: Christian Rasmussen MRN: [id number] Date of Birth: 01-11-1941   Medicare Important Message Given:  Yes    Nathen May 10/23/2015, 11:12 AM

## 2015-10-23 NOTE — Progress Notes (Signed)
Pt progressing well with his ambulation at this time. Likely can d/c home with family without need for an inpt rehab stay at this time. HH is recommended at this time per Dr. Naaman Plummer. [phone]

## 2015-10-23 NOTE — Progress Notes (Signed)
CARDIAC REHAB PHASE I   PRE:  Rate/Rhythm: 92 SR    BP: sitting (at door)    SaO2: 98 RA  MODE:  Ambulation: 350 ft   POST:  Rate/Rhythm: 105 ST    BP: sitting 134/69     SaO2: 97 RA  Pt coming out of bathroom. Ambulated with RW, assist x1. Reminders to stay close to RW and stay inside on turns. Pt sts he is tired this afternoon and ready to take a nap. Several short rest stops. To bed, VSS. Will f/u in am with wife for education. [place], ACSM 10/23/2015 2:00 PM

## 2015-10-23 NOTE — Progress Notes (Signed)
Physical Therapy Treatment Patient Details Name: Christian Rasmussen MRN: [id number] DOB: 16-Oct-1940 Today's Date: 10/23/2015    History of Present Illness Christian Rasmussen is a 75 y.o. male with past medical history of CAD (s/p PCI to RCA in 2004), atrial flutter (s/p ablation 2014), HTN, Type 2 DM, and HLD who presented to [facility] on 10/12/2015 for evaluation of chest pain.  Pt underwent CABG x4 and MVR on 10/15/15.  Pt then had pacemaker placed 10/20/15.      PT Comments    Pt admitted with above diagnosis. Pt currently with functional limitations due to balance and endurance deficits. Pt ambulated with wife assist today. Wife feels comfortable with assisting pt at home. Wife to go get gait belt today.  Education with wife complete.    Pt will benefit from skilled PT to increase their independence and safety with mobility to allow discharge to the venue listed below.    Follow Up Recommendations  Supervision/Assistance - 24 hour;Outpatient PT     Equipment Recommendations  Rolling walker with 5" wheels; gait belt   Recommendations for Other Services       Precautions / Restrictions Precautions Precautions: Fall;Sternal Restrictions Weight Bearing Restrictions: Yes (sternal precautions)    Mobility  Bed Mobility               General bed mobility comments: in chair on arrival.  Transfers Overall transfer level: Needs assistance Equipment used: Rolling walker (2 wheeled) Transfers: Sit to/from Stand Sit to Stand: Supervision         General transfer comment: Pts wife has decided to take pt home therefore allowed her to ambulate with pt.  Pt needed cues for hand placement to hold pillow tomaintain sternal precautions.   Ambulation/Gait Ambulation/Gait assistance: Supervision;Min guard Ambulation Distance (Feet): 350 Feet Assistive device: Rolling walker (2 wheeled) Gait Pattern/deviations: Step-through pattern;Decreased stride length;Trunk flexed;Drifts right/left    Gait velocity interpretation: <1.8 ft/sec, indicative of risk for recurrent falls General Gait Details: Pt doing much better with gait only needed occasional cues for staying close to RW.  Also occasionally needed cues to stand up tall.  Wife demonstrates ability to guard pt safely with gait belt as well as appropriate cuing of pt.    Stairs Stairs: Yes Stairs assistance: Min guard Stair Management: One rail Right;Step to pattern;Forwards Number of Stairs: 2 General stair comments: wife and pt educated in going up and down stairs and wife assists and cues appropriatelly.   Wheelchair Mobility    Modified Rankin (Stroke Patients Only)       Balance Overall balance assessment: Needs assistance;History of Falls Sitting-balance support: No upper extremity supported;Feet supported Sitting balance-Leahy Scale: Good     Standing balance support: Bilateral upper extremity supported;During functional activity Standing balance-Leahy Scale: Poor Standing balance comment: relies on UEs for support with ambulation                     Cognition Arousal/Alertness: Awake/alert Behavior During Therapy: Anxious;Impulsive Overall Cognitive Status: Impaired/Different from baseline         Following Commands: Follows one step commands inconsistently Safety/Judgement: Decreased awareness of safety Awareness: Intellectual   General Comments: Pt less confused.      Exercises      General Comments        Pertinent Vitals/Pain Pain Assessment: No/denies pain  VSS    Home Living  Prior Function            PT Goals (current goals can now be found in the care plan section) Progress towards PT goals: Progressing toward goals    Frequency  Min 3X/week    PT Plan Discharge plan needs to be updated    Co-evaluation             End of Session Equipment Utilized During Treatment: Gait belt Activity Tolerance: Patient limited by  fatigue Patient left: in chair;with call bell/phone within reach;with chair alarm set;with family/visitor present     Time: [id number] PT Time Calculation (min) (ACUTE ONLY): 20 min  Charges:  $Gait Training: 8-22 mins                    G CodesDenice Paradise 11/20/2015, 3:16 PM [employer] Acute Rehabilitation [phone] (pager)

## 2015-10-23 NOTE — Discharge Summary (Signed)
Physician Discharge Summary  Patient ID: Christian Rasmussen MRN: [id number] DOB/AGE: April 15, 1941 75 y.o.  Admit date: 10/12/2015 Discharge date: 10/24/2015  Admission Diagnoses: severe 3 vessel CAD  Discharge Diagnoses:  Principal Problem:   S/P mitral valve repair + CABG x4 Active Problems:   CORONARY ATHEROSCLEROSIS NATIVE CORONARY ARTERY   Right bundle branch block (RBBB) with left anterior hemiblock   CAD (coronary artery disease)   Mild aortic stenosis   NSTEMI (non-ST elevated myocardial infarction) (HCC)   Unstable angina pectoris (HCC)   Aortic stenosis   S/P CABG x 4   Mitral regurgitation   Aortic insufficiency   S/P CABG x 3  Patient Active Problem List   Diagnosis Date Noted  . S/P CABG x 3   . 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 S/P mitral valve repair + CABG x4 10/15/2015  . Mitral regurgitation 10/15/2015  . Aortic insufficiency 10/15/2015  . Aortic stenosis 10/14/2015  . NSTEMI (non-ST elevated myocardial infarction) ([place]) 10/12/2015  . Unstable angina pectoris ([place]) 10/12/2015  . Mild aortic stenosis 04/13/2013  . Atrial flutter ([place]) 11/09/2012  . Snoring 11/09/2012  . Right bundle branch block (RBBB) with left anterior hemiblock   . Hypertension   . CAD (coronary artery disease)   . DYSLIPIDEMIA 04/11/2008  . CORONARY ATHEROSCLEROSIS NATIVE CORONARY ARTERY 04/11/2008  . PERCUTANEOUS TRANSLUMINAL CORONARY ANGIOPLASTY, HX OF 04/11/2008     History of Present Illness   Christian Rasmussen is a 75 y.o. male with past medical history of CAD (s/p PCI to RCA in 2004), atrial flutter (s/p ablation 2014), HTN, Type 2 DM, and HLD who presented to [facility] on 10[facility]12/2015 for evaluation of chest pain.  Patient reports he was seen by Dr. Bronson Ing a few months ago and was doing well. Over the past 2-3 weeks, he has been developing worsening dyspnea with exertion associated with mild chest discomfort. 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 Relieved with rest. Has not taken any SL NTG due to not having an Rx. Reports his symptoms feel similar to 2004 when he required stent placement, for his main presenting symptom at that time was new onset dyspnea with physical exertion while doing yard work.  He denies any recent increase in his symptoms today. Says he informed his wife of the worsening dyspnea yesterday and she made him seek evaluation today.  While at [facility], his EKG showed NSR, HR 85 with RBBB. Initial troponin elevated to 0.09. BMET with no significant electrolyte abnormalities, glucose elevated to 205, and creatinine stable at 0.77.CBC with no significant abnormalities.  He was therefore transferred to [facility] for further evaluation.       Discharged Condition: good  Hospital Course: The patient was admitted in transfer to the cardiology service.He was continued on aggressive medical management for non-STEMI. This included heparin, aspirin, beta blocker as well as statin therapy. He was scheduled for cardiac catheterization which was done on 10/13/2015.This revealed severe multivessel coronary artery disease  With preserved left ventricular function. Due to these findings cardiothoracic surgical consultation was obtained with Darylene Price M.D. Who evaluated the patient and his studies and agreed with recommendations to proceed with surgical revascularization.Due to the findings on echocardiogram it was also felt he would require further intraoperative evaluation of his valves for potential mitral or aortic valve treatment.He remained medically stable and on 6/7 /2017Where he underwent the below described surgical procedure. He was taken to the surgical  intensive care unit in stable condition.  Postoperative course: He was initially placed on the rapid ventilatory wean protocol but he evidenced early increased work of breathing and tachycardia  initial attempts at a rapid wean failed.He was able to be weaned by early  morning on postoperative day #1.He did require early use of inotropic support including milrinone, dopamine and Levofed  Drips.  These were able to be weaned over time using standard monitoring of cardiac output in parameters.He did have an expected acute blood loss anemia and required transfusion intraoperatively as well. He had some significant postoperative volume overload which has required aggressive diuresis. Blood sugars have been under adequate control using standard protocols.He was pacer dependent postoperatively and monitored closely but ultimately was determined that electrophysiology consultation would be required and a permanent pacemaker was placed by Dr. Lovena Le.His progress has been slow but overall good and PT has assisted. Incisions are healing without signs of infection. Oxygen has been weaned . INR is slowly increasing and will be checked next Monday. He is overall felt to be stable for discharge today.  Consults: EP  Significant Diagnostic Studies: routine post op labs/CXR, Echo 6/6, cardiac cath 6/5   Treatments: surgery:  CARDIOTHORACIC SURGERY OPERATIVE NOTE  Date of Procedure:10/15/2015  Preoperative Diagnosis:  Severe 3-vessel Coronary Artery Disease  Unstable Angina  Mild Aortic Stenosis  Mild Aortic Insufficiency  Mild Mitral Regurgitation  Postoperative Diagnosis:  Severe 3-vessel Coronary Artery Disease  Unstable Angina  Mild Aortic Stenosis  Mild Aortic Insufficiency  Moderate-Severe Mitral Regurgitation  Procedure:   Coronary Artery Bypass Grafting x 4 Left Internal Mammary Artery to Distal Left Anterior Descending Coronary Artery Saphenous Vein Graft to Posterior Descending Coronary Artery Sequential Sapheonous Vein Graft to Right Posterolateral Branch Coronary Artery Saphenous Vein Graft to Obtuse Marginal Branch of Left Circumflex Coronary  Artery Endoscopic Vein Harvest from Bilateral Thighs   Mitral Valve Repair Complex valvuloplasty including artificial Gore-tex neochord placement x6 Decalcification of posterior mitral annulus Autologous pericardial patch augmentation of posterior leaflet Sorin Memo 3D ring annuloplasty (size 69mm, catalog #[id number], serial [id number])  Surgeon:Clarence H. Roxy Manns, MD  Assistant:Delroy Ordway Orson Ape, PA-C  Anesthesia:Kevin Smith Robert, MD and Midge Minium, MD  Cardiologist:Dalton Aundra Dubin, MD  Operative Findings:  Normal LV systolic function  Trileaflet aortic valve with calcification above left coronary leaflet and very mild aortic stenosis, mild (1+/2+) aortic insufficiency  Calcification of posterior mitral annulus with overriding anterior leaflet (elongated primary chordae tendineae with prolapse) and very eccentric jet of mitral valve regurgitation that wrapped around entire left atrium  Type II and type IIIA mitral valve dysfunction with moderate-severe (3+/4+) mitral regurgitation  Good quality LIMA conduit for grafting  Good quality SVG conduit for grafting  Fair to good quality target vessels for grafting  No residual mitral regurgitation after successful valve repair Discharge Exam: Blood pressure 128/70, pulse 87, temperature 98.6 F (37 C), temperature source Oral, resp. rate 18, height 5\' 11"  (1.803 m), weight 236 lb 12.4 oz (107.4 kg), SpO2 96 %.   General appearance: alert, cooperative and no distress Heart: regular rate and rhythm Lungs: clear to auscultation bilaterally Abdomen: benign Extremities: min edema Wound: incis healing well, echymosis is stable Disposition: 01-Home or Self Care     Medication List    STOP taking these medications        chlorthalidone 25 MG tablet  Commonly known as:  HYGROTON     Krill Oil 1000 MG Caps       TAKE these medications  amLODipine 10 MG tablet  Commonly known as:  NORVASC  Take 1 tablet (10 mg total) by mouth daily.     aspirin EC 81 MG tablet  Take 1 tablet (81 mg total) by mouth daily.     ASTAXANTHIN PO  Take 10 mg by mouth daily.     atorvastatin 40 MG tablet  Commonly known as:  LIPITOR  Take 1 tablet by mouth daily.     Berberine Chloride Powd  Take 1,200 mg by mouth daily.     carvedilol 12.5 MG tablet  Commonly known as:  COREG  Take 1 tablet (12.5 mg total) by mouth 2 (two) times daily.     Co Q10 100 MG Caps  Take 1 tablet by mouth daily.     ferrous Q000111Q C-folic acid capsule  Commonly known as:  TRINSICON / FOLTRIN  Take 1 capsule by mouth 2 (two) times daily with a meal.     fish oil-omega-3 fatty acids 1000 MG capsule  Take 2 g by mouth daily.     furosemide 40 MG tablet  Commonly known as:  LASIX  Take 1 tablet (40 mg total) by mouth daily.     lisinopril 20 MG tablet  Commonly known as:  PRINIVIL,ZESTRIL  Take 1 tablet (20 mg total) by mouth daily.     metFORMIN 500 MG tablet  Commonly known as:  GLUCOPHAGE  Take 1,000 mg by mouth 2 (two) times daily.     multivitamin with minerals Tabs tablet  Take 1 tablet by mouth daily.     OVER THE COUNTER MEDICATION  Take 1 capsule by mouth 3 (three) times daily. Glucose Essentials- Chromium, [place] kino tree extract, Gymnema, Tursaline Extract,Vanadyl Sulfate, Banaba Extract     potassium chloride 10 MEQ tablet  Commonly known as:  K-DUR,KLOR-CON  Take 1 tablet (10 mEq total) by mouth daily.     warfarin 2 MG tablet  Commonly known as:  COUMADIN  Take 3 tablets (6 mg total) by mouth daily at 6 PM.       Follow-up Information    Follow up with [facility] On 10/30/2015.   Specialty:  Cardiology   Why:  2:30PM, wound check   Contact information:   [address] [place] [phone]      Follow up  with [name], MD On 01/23/2016.   Specialty:  Cardiology   Why:  11:45AM   Contact information:   [address] [address] [phone]       Follow up with Rexene Alberts, MD.   Specialty:  Cardiothoracic Surgery   Why:  11/24/2015 at 4:30 PM to see the surgeon. Please obtain a chest x-ray at 4 PM at [facility] which is located in the same office complex.   Contact information:   [address] [place] [address] [phone]       Follow up with Kate Sable, MD.   Specialty:  Cardiology   Why:  2 weeks- office will contact you, call if they do not to arrange   Contact information:   [place] [address] [phone]       Follow up with [facility].   Specialty:  Cardiology   Why:  next monday fot PT/INR check to adjust coumadin dose- office will contact you, call if they do not  to arrange  Contact information:   [address], [place] [phone]     The patient has been discharged on:   1.Beta Blocker:  Yes y[   ]                              No   [   ]                              If No, reason:  2.Ace Inhibitor/ARB: Yes [  y ]                                     No  [    ]                                     If No, reason:  3.Statin:   Yes Blue.Reese   ]                  No  [   ]                  If No, reason:  4.Ecasa:  Yes  [ y  ]                  No   [   ]                  If No, reason:  Signed: Cuma Polyakov E 10/24/2015, 8:00 AM

## 2015-10-23 NOTE — Progress Notes (Addendum)
[address]       [place] [address]             [phone]      3 Days Post-Op Procedure(s) (LRB): Pacemaker Implant (N/A) Subjective: conts to feel better  Objective: Vital signs in last 24 hours: Temp:  [97.4 F (36.3 C)-98.1 F (36.7 C)] 97.4 F (36.3 C) (06/15 0634) Pulse Rate:  [90-104] 90 (06/15 0634) Cardiac Rhythm:  [-] Ventricular paced;Bundle branch block (06/14 1900) Resp:  [17-18] 18 (06/14 1958) BP: (114-133)/(56-63) 133/57 mmHg (06/15 0634) SpO2:  [94 %-96 %] 96 % (06/15 0634) Weight:  [239 lb (108.41 kg)] 239 lb (108.41 kg) (06/15 0606)  Hemodynamic parameters for last 24 hours:    Intake/Output from previous day: 06/14 0701 - 06/15 0700 In: 480 [P.O.:480] Out: 1075 [Urine:1075] Intake/Output this shift:    General appearance: alert, cooperative and no distress Heart: regular rate and rhythm Lungs: clear to auscultation bilaterally Abdomen: benign Extremities: + LE edema Wound: incis healing well, some drainage persists LLE EVH  Lab Results:  Recent Labs  10/22/15 0244 10/23/15 0339  WBC 17.6* 14.1*  HGB 9.1* 8.1*  HCT 28.8* 26.0*  PLT 323 308   BMET:  Recent Labs  10/22/15 0244 10/23/15 0339  NA 140 138  K 4.4 5.1  CL 102 100*  CO2 30 29  GLUCOSE 117* 139*  BUN 24* 28*  CREATININE 0.82 0.82  CALCIUM 9.6 9.3    PT/INR:  Recent Labs  10/23/15 0339  LABPROT 16.6*  INR 1.33   ABG    Component Value Date/Time   PHART 7.415 10/16/2015 0530   HCO3 23.2 10/16/2015 0530   TCO2 26 10/18/2015 1645   ACIDBASEDEF 1.0 10/16/2015 0530   O2SAT 97.0 10/16/2015 0530   CBG (last 3)   Recent Labs  10/22/15 1608 10/22/15 2133 10/23/15 0605  GLUCAP 114* 138* 127*    Meds Scheduled Meds: . amLODipine  10 mg Oral Daily  . antiseptic oral rinse  7 mL Mouth Rinse BID  . aspirin EC  81 mg Oral Daily  . atorvastatin  40 mg Oral q1800  . bisacodyl  10 mg Oral Daily   Or  . bisacodyl  10 mg Rectal Daily    . carvedilol  12.5 mg Oral BID  . docusate sodium  200 mg Oral Daily  . enoxaparin (LOVENOX) injection  40 mg Subcutaneous Q24H  . ferrous Q000111Q C-folic acid  1 capsule Oral BID WC  . furosemide  40 mg Oral BID  . insulin aspart  0-15 Units Subcutaneous TID WC  . insulin aspart  0-5 Units Subcutaneous QHS  . lisinopril  20 mg Oral Daily  . metFORMIN  1,000 mg Oral BID WC  . pantoprazole  40 mg Oral Daily  . potassium chloride  40 mEq Oral BID  . sodium chloride flush  3 mL Intravenous Q12H  . sodium chloride flush  3 mL Intravenous Q12H  . warfarin  5 mg Oral q1800  . Warfarin - Physician Dosing Inpatient   Does not apply q1800   Continuous Infusions: . sodium chloride    . sodium chloride 20 mL/hr at 10/17/15 2006   PRN Meds:.sodium chloride, acetaminophen, ondansetron (ZOFRAN) IV, ondansetron (ZOFRAN) IV, sodium chloride flush, sodium chloride flush, traMADol  Xrays Dg Chest 2 View  10/21/2015  CLINICAL DATA:  Status post pacemaker insertion. EXAM: CHEST  2 VIEW COMPARISON:  October 19, 2015. FINDINGS: Stable cardiomegaly. Status  post coronary artery bypass graft. Left-sided pacemaker is noted with leads in grossly good position. No pneumothorax is noted. Mild bilateral pleural effusions are noted. Mild bibasilar subsegmental atelectasis is noted. IMPRESSION: Mild bibasilar subsegmental atelectasis with mild bilateral pleural effusions. Electronically Signed   By: Marijo Conception, M.D.   On: 10/21/2015 09:33    Assessment/Plan: S/P Procedure(s) (LRB): Pacemaker Implant (N/A)   1 steady progress 2 Family would like inpatient CIR if qualifies vs SNF- will consult rehab 3 hemodyn stable , conts to diurese well 4 increase Coumadin to 6 mg 5 sugars controlled  LOS: 11 days    GOLD,WAYNE E 10/23/2015  I have seen and examined the patient and agree with the assessment and plan as outlined.  Patient is making good progress and reportedly ambulating fairly well w/out  assistance.  Anticipate d/c tomorrow.  Rexene Alberts, MD 10/23/2015 8:30 AM

## 2015-10-24 DIAGNOSIS — Z951 Presence of aortocoronary bypass graft: Secondary | ICD-10-CM | POA: Insufficient documentation

## 2015-10-24 LAB — BASIC METABOLIC PANEL
Anion gap: 6 (ref 5–15)
BUN: 23 mg/dL — ABNORMAL HIGH (ref 6–20)
CHLORIDE: 100 mmol/L — AB (ref 101–111)
CO2: 29 mmol/L (ref 22–32)
CREATININE: 0.84 mg/dL (ref 0.61–1.24)
Calcium: 9.3 mg/dL (ref 8.9–10.3)
Glucose, Bld: 138 mg/dL — ABNORMAL HIGH (ref 65–99)
POTASSIUM: 4.8 mmol/L (ref 3.5–5.1)
SODIUM: 135 mmol/L (ref 135–145)

## 2015-10-24 LAB — GLUCOSE, CAPILLARY
GLUCOSE-CAPILLARY: 130 mg/dL — AB (ref 65–99)
GLUCOSE-CAPILLARY: 109 mg/dL — AB (ref 65–99)

## 2015-10-24 LAB — PROTIME-INR
INR: 1.35 (ref 0.00–1.49)
Prothrombin Time: 16.8 seconds — ABNORMAL HIGH (ref 11.6–15.2)

## 2015-10-24 MED ORDER — COUMADIN BOOK
Status: DC
  Filled 2015-10-24: qty 1

## 2015-10-24 MED ORDER — AMLODIPINE BESYLATE 10 MG PO TABS: 10.0000 mg | ORAL_TABLET | Freq: Every day | ORAL | Status: DC

## 2015-10-24 MED ORDER — FUROSEMIDE 40 MG PO TABS: 40.0000 mg | ORAL_TABLET | Freq: Every day | ORAL | Status: DC

## 2015-10-24 MED ORDER — WARFARIN SODIUM 2 MG PO TABS: 6.0000 mg | ORAL_TABLET | Freq: Every day | ORAL | Status: DC

## 2015-10-24 MED ORDER — POTASSIUM CHLORIDE CRYS ER 10 MEQ PO TBCR: 10.0000 meq | EXTENDED_RELEASE_TABLET | Freq: Every day | ORAL | Status: DC

## 2015-10-24 MED ORDER — FE FUMARATE-B12-VIT C-FA-IFC PO CAPS: 1.0000 | ORAL_CAPSULE | Freq: Two times a day (BID) | ORAL | Status: DC

## 2015-10-24 MED ORDER — CARVEDILOL 12.5 MG PO TABS: 12.5000 mg | ORAL_TABLET | Freq: Two times a day (BID) | ORAL | Status: DC

## 2015-10-24 MED ORDER — POTASSIUM CHLORIDE CRYS ER 10 MEQ PO TBCR
10.0000 meq | EXTENDED_RELEASE_TABLET | Freq: Two times a day (BID) | ORAL | Status: DC
  Administered 2015-10-24: 10 meq via ORAL
  Filled 2015-10-24: qty 1

## 2015-10-24 NOTE — Care Management Note (Signed)
Case Management Note Previous CM note initiated by Jacqlyn Krauss RN, CM  Patient Details  Name: Christian Rasmussen MRN: [id number] Date of Birth: 07-13-1940  Subjective/Objective:  Pt admitted for Nstemi. Post Cardiac Cath- revealed Multivessel disease. Plan for CABG. Pt is from home with wife.                   Action/Plan: CM will continue to monitor for additional needs post procedure.    Expected Discharge Date:     10/24/15             Expected Discharge Plan:  [place]  In-House Referral:     Discharge planning Services  CM Consult  Post Acute Care Choice:    Choice offered to:     DME Arranged:  [place] rolling DME Agency:  [place]:  NA HH Agency:  NA  Status of Service:  Completed, signed off  Medicare Important Message Given:  Yes Date Medicare IM Given:    Medicare IM give by:    Date Additional Medicare IM Given:    Additional Medicare Important Message give by:     If discussed at [place] of Stay Meetings, dates discussed:    Additional Comments:  10/24/15- 1000- Marvetta Gibbons RN, BSN- call made to Manuela Schwartz with [facility] for DME need- RW to be delivered to room prior to discharge  Dawayne Patricia, RN 10/24/2015, 3:42 PM

## 2015-10-24 NOTE — Progress Notes (Signed)
10/24/2015 1200 Chest tube sutures x4 d/c'd.  Pt tolerated well. [place]

## 2015-10-24 NOTE — Progress Notes (Signed)
10/24/2015 1250 Discharge AVS meds taken today and those due this evening reviewed.  Follow-up appointments and when to call md reviewed.  D/C IV and TELE.  Questions and concerns addressed.   D/C home per orders. [place]

## 2015-10-24 NOTE — Progress Notes (Signed)
CARDIAC REHAB PHASE I   Pt eagerly awaiting discharge. Cardiac surgery discharge education completed with pt and wife at bedside. Reviewed risk factors, IS, sternal precautions, activity progression, exercise, daily weights, heart healthy diet, carb counting, portion control, sodium restrictions, and phase 2 cardiac rehab. Pt and wife verbalized understanding, receptive to education. Pt agrees to phase 2 cardiac rehab referral, will send to [facility] per pt request. Pt in recliner, call bell within reach.   [place], RN, BSN 10/24/2015 9:48 AM

## 2015-10-24 NOTE — Progress Notes (Addendum)
[address]       ,[place] [address]             [phone]      4 Days Post-Op Procedure(s) (LRB): Pacemaker Implant (N/A) Subjective: conts to feel well  Objective: Vital signs in last 24 hours: Temp:  [98.3 F (36.8 C)-98.6 F (37 C)] 98.6 F (37 C) (06/16 0616) Pulse Rate:  [87-88] 87 (06/16 0616) Cardiac Rhythm:  [-] Ventricular paced (06/15 1900) Resp:  [18-20] 18 (06/16 0616) BP: (109-128)/(45-70) 128/70 mmHg (06/16 0616) SpO2:  [96 %-97 %] 96 % (06/16 0616) Weight:  [236 lb 12.4 oz (107.4 kg)] 236 lb 12.4 oz (107.4 kg) (06/16 0616)  Hemodynamic parameters for last 24 hours:    Intake/Output from previous day: 06/15 0701 - 06/16 0700 In: 486 [P.O.:480; I.V.:6] Out: 200 [Urine:200] Intake/Output this shift:    General appearance: alert, cooperative and no distress Heart: regular rate and rhythm Lungs: clear to auscultation bilaterally Abdomen: benign Extremities: min edema Wound: incis healing well, echymosis is stable  Lab Results:  Recent Labs  10/22/15 0244 10/23/15 0339  WBC 17.6* 14.1*  HGB 9.1* 8.1*  HCT 28.8* 26.0*  PLT 323 308   BMET:  Recent Labs  10/23/15 0339 10/24/15 0426  NA 138 135  K 5.1 4.8  CL 100* 100*  CO2 29 29  GLUCOSE 139* 138*  BUN 28* 23*  CREATININE 0.82 0.84  CALCIUM 9.3 9.3    PT/INR:  Recent Labs  10/24/15 0426  LABPROT 16.8*  INR 1.35   ABG    Component Value Date/Time   PHART 7.415 10/16/2015 0530   HCO3 23.2 10/16/2015 0530   TCO2 26 10/18/2015 1645   ACIDBASEDEF 1.0 10/16/2015 0530   O2SAT 97.0 10/16/2015 0530   CBG (last 3)   Recent Labs  10/23/15 1704 10/23/15 2140 10/24/15 0614  GLUCAP 154* 125* 130*    Meds Scheduled Meds: . amLODipine  10 mg Oral Daily  . antiseptic oral rinse  7 mL Mouth Rinse BID  . aspirin EC  81 mg Oral Daily  . atorvastatin  40 mg Oral q1800  . bisacodyl  10 mg Oral Daily   Or  . bisacodyl  10 mg Rectal Daily  . carvedilol   12.5 mg Oral BID  . docusate sodium  200 mg Oral Daily  . enoxaparin (LOVENOX) injection  40 mg Subcutaneous Q24H  . ferrous Q000111Q C-folic acid  1 capsule Oral BID WC  . furosemide  40 mg Oral BID  . insulin aspart  0-15 Units Subcutaneous TID WC  . insulin aspart  0-5 Units Subcutaneous QHS  . lisinopril  20 mg Oral Daily  . metFORMIN  1,000 mg Oral BID WC  . pantoprazole  40 mg Oral Daily  . sodium chloride flush  3 mL Intravenous Q12H  . sodium chloride flush  3 mL Intravenous Q12H  . warfarin  6 mg Oral q1800  . Warfarin - Physician Dosing Inpatient   Does not apply q1800   Continuous Infusions: . sodium chloride    . sodium chloride 20 mL/hr at 10/17/15 2006   PRN Meds:.sodium chloride, acetaminophen, ondansetron (ZOFRAN) IV, ondansetron (ZOFRAN) IV, sodium chloride flush, sodium chloride flush, traMADol  Xrays No results found.  Assessment/Plan: S/P Procedure(s) (LRB): Pacemaker Implant (N/A) Plan for discharge: see discharge orders doing well Change lasix to daily   LOS: 12 days    GOLD,WAYNE E 10/24/2015  I have seen  and examined the patient and agree with the assessment and plan as outlined.  D/C home today on coumadin 5 mg/day  Rexene Alberts, MD 10/24/2015 7:59 AM

## 2015-10-27 ENCOUNTER — Ambulatory Visit (INDEPENDENT_AMBULATORY_CARE_PROVIDER_SITE_OTHER): Payer: Medicare Other | Admitting: *Deleted

## 2015-10-27 DIAGNOSIS — Z5181 Encounter for therapeutic drug level monitoring: Secondary | ICD-10-CM

## 2015-10-27 DIAGNOSIS — Z9889 Other specified postprocedural states: Secondary | ICD-10-CM | POA: Diagnosis not present

## 2015-10-27 DIAGNOSIS — I4892 Unspecified atrial flutter: Secondary | ICD-10-CM

## 2015-10-27 LAB — POCT INR: INR: 1.3

## 2015-10-27 MED ORDER — WARFARIN SODIUM 5 MG PO TABS: ORAL_TABLET | ORAL | Status: DC

## 2015-10-28 ENCOUNTER — Telehealth: Payer: Self-pay | Admitting: Internal Medicine

## 2015-10-28 NOTE — Telephone Encounter (Signed)
Patient c/o DOE since leaving the hospital along with LE edema.   Patient denies any CP, dizziness, or syncope.   Patient states that he did experience DOE prior to his hospitalization as well. I informed patient that he may be experiencing the DOE bc of the CABG and MVR that was done while in the hospital less than 2 weeks ago. Patient voiced understanding.  I told patient that he could send in a remote transmission for review.   Plan to call patient in the morning with results.

## 2015-10-28 NOTE — Telephone Encounter (Signed)
Pt c/o Shortness Of Breath: STAT if SOB developed within the last 24 hours or pt is noticeably SOB on the phone  Pt wanted to speak w/ RN- statedpt is still SOB after device implant. Please call back and discuss.    1. Are you currently SOB (can you hear that pt is SOB on the phone)? no  2. How long have you been experiencing SOB? "a while- before device implant and since procedure  3. Are you SOB when sitting or when up moving around? Just after moving around   4. Are you currently experiencing any other symptoms? No

## 2015-10-29 NOTE — Telephone Encounter (Signed)
Informed patient that his device function is stable. Lead measurements are consistent with measurements from hospital. There were no sustained atrial arrhythmias. Patient's presenting rhythm is AsVp.   I reiterated to patient that his sx's are likely coming from his recent procedures. Patient voiced understanding.  Note sent to Dr.Koneswaren's RN for further recommendation.

## 2015-10-29 NOTE — Telephone Encounter (Signed)
Transmission received and reviewed. Normal device function. Presenting rhythm: Sinus rhythm with ventricular pacing. Brief pisodes of atrial tachycardia noted 6/20 and 6/21, ventricular rate controlled (V pacing). Respiratory rate averaging about 20 breaths/min.  I have explained to Christian Rasmussen that I do not see an explanation for his shortness of breath as it relates to his PPM function. He has had recent CABG and mitral valve repair. I will forward to Dr. Bronson Ing and Collie Siad, LPN for review/recommendations. 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

## 2015-10-29 NOTE — Telephone Encounter (Signed)
Pt called in and I informed him that we have not received his remote transmission. Attempted to help pt trouble shoot his home monitor. After unsuccessful attempt I instructed pt to call tech services. Pt verbalized understanding.

## 2015-10-29 NOTE — Telephone Encounter (Signed)
Patient notified.  Advised to call back if symptoms worsen before his follow up visit next week.

## 2015-10-29 NOTE — Telephone Encounter (Signed)
No further advice 

## 2015-10-29 NOTE — Telephone Encounter (Signed)
Patient has wound check scheduled for 10/30/15 [facility]) & post hospital follow up scheduled for 11/07/2015 with Dr. Bronson Ing.  Fwd to provider for any further advice.

## 2015-10-30 ENCOUNTER — Ambulatory Visit (INDEPENDENT_AMBULATORY_CARE_PROVIDER_SITE_OTHER): Payer: Medicare Other | Admitting: *Deleted

## 2015-10-30 ENCOUNTER — Encounter: Payer: Self-pay | Admitting: Internal Medicine

## 2015-10-30 DIAGNOSIS — Z5181 Encounter for therapeutic drug level monitoring: Secondary | ICD-10-CM

## 2015-10-30 DIAGNOSIS — Z9889 Other specified postprocedural states: Secondary | ICD-10-CM

## 2015-10-30 DIAGNOSIS — I4892 Unspecified atrial flutter: Secondary | ICD-10-CM

## 2015-10-30 DIAGNOSIS — Z950 Presence of cardiac pacemaker: Secondary | ICD-10-CM

## 2015-10-30 LAB — CUP PACEART INCLINIC DEVICE CHECK
Brady Statistic RA Percent Paced: 1 %
Date Time Interrogation Session: 20170622040000
Implantable Lead Implant Date: 20170612
Implantable Lead Implant Date: 20170612
Implantable Lead Location: 753859
Implantable Lead Model: 7741
Implantable Lead Model: 7742
Lead Channel Impedance Value: 667 Ohm
Lead Channel Pacing Threshold Amplitude: 0.5 V
Lead Channel Setting Pacing Amplitude: 1 V
Lead Channel Setting Pacing Amplitude: 3.5 V
Lead Channel Setting Pacing Pulse Width: 0.4 ms
MDC IDC LEAD LOCATION: 753860
MDC IDC LEAD SERIAL: 733074
MDC IDC LEAD SERIAL: 760165
MDC IDC MSMT LEADCHNL RA SENSING INTR AMPL: 2.9 mV
MDC IDC MSMT LEADCHNL RV IMPEDANCE VALUE: 793 Ohm
MDC IDC MSMT LEADCHNL RV PACING THRESHOLD PULSEWIDTH: 0.4 ms
MDC IDC SET LEADCHNL RV SENSING SENSITIVITY: 2.5 mV
MDC IDC STAT BRADY RV PERCENT PACED: 99 %
Pulse Gen Serial Number: 724121

## 2015-10-30 LAB — POCT INR: INR: 1.4

## 2015-10-30 NOTE — Progress Notes (Signed)
Wound check appointment. Steri-strips removed. Wound without redness or edema. Incision edges approximated, wound well healed. Normal device function. Threshold, sensing, and impedances consistent with implant measurements. RA output 3.5V. RV programmed auto threshold programmed on at implant. Histogram distribution appropriate for patient and level of activity. 1hr total of AT/AF + Coumadin. No high ventricular rates noted. Patient educated about wound care, arm mobility, lifting restrictions. ROV 01/23/2016 with GT.

## 2015-11-04 ENCOUNTER — Ambulatory Visit (INDEPENDENT_AMBULATORY_CARE_PROVIDER_SITE_OTHER): Payer: Medicare Other | Admitting: *Deleted

## 2015-11-04 DIAGNOSIS — Z9889 Other specified postprocedural states: Secondary | ICD-10-CM | POA: Diagnosis not present

## 2015-11-04 DIAGNOSIS — Z5181 Encounter for therapeutic drug level monitoring: Secondary | ICD-10-CM | POA: Diagnosis not present

## 2015-11-04 DIAGNOSIS — I4892 Unspecified atrial flutter: Secondary | ICD-10-CM

## 2015-11-04 LAB — POCT INR: INR: 1.7

## 2015-11-07 ENCOUNTER — Encounter: Payer: Self-pay | Admitting: Cardiovascular Disease

## 2015-11-07 ENCOUNTER — Ambulatory Visit (INDEPENDENT_AMBULATORY_CARE_PROVIDER_SITE_OTHER): Payer: Medicare Other | Admitting: Cardiovascular Disease

## 2015-11-07 VITALS — BP 128/80 | HR 90 | Ht 71.0 in | Wt 236.0 lb

## 2015-11-07 DIAGNOSIS — I2510 Atherosclerotic heart disease of native coronary artery without angina pectoris: Secondary | ICD-10-CM

## 2015-11-07 DIAGNOSIS — E785 Hyperlipidemia, unspecified: Secondary | ICD-10-CM

## 2015-11-07 DIAGNOSIS — Z9889 Other specified postprocedural states: Secondary | ICD-10-CM

## 2015-11-07 DIAGNOSIS — D509 Iron deficiency anemia, unspecified: Secondary | ICD-10-CM

## 2015-11-07 DIAGNOSIS — Z9289 Personal history of other medical treatment: Secondary | ICD-10-CM

## 2015-11-07 DIAGNOSIS — Z950 Presence of cardiac pacemaker: Secondary | ICD-10-CM

## 2015-11-07 DIAGNOSIS — Z5181 Encounter for therapeutic drug level monitoring: Secondary | ICD-10-CM | POA: Diagnosis not present

## 2015-11-07 DIAGNOSIS — I350 Nonrheumatic aortic (valve) stenosis: Secondary | ICD-10-CM

## 2015-11-07 DIAGNOSIS — I214 Non-ST elevation (NSTEMI) myocardial infarction: Secondary | ICD-10-CM

## 2015-11-07 DIAGNOSIS — I4892 Unspecified atrial flutter: Secondary | ICD-10-CM | POA: Diagnosis not present

## 2015-11-07 DIAGNOSIS — R0602 Shortness of breath: Secondary | ICD-10-CM

## 2015-11-07 DIAGNOSIS — Z87898 Personal history of other specified conditions: Secondary | ICD-10-CM

## 2015-11-07 DIAGNOSIS — I25768 Atherosclerosis of bypass graft of coronary artery of transplanted heart with other forms of angina pectoris: Secondary | ICD-10-CM

## 2015-11-07 DIAGNOSIS — K921 Melena: Secondary | ICD-10-CM

## 2015-11-07 NOTE — Progress Notes (Signed)
Patient ID: Christian Rasmussen, male   DOB: 14-Jan-1941, 75 y.o.   MRN: [id number]      SUBJECTIVE: The patient presents for post hospitalization follow-up. He underwent mitral valve repair, 4 vessel CABG, and pacemaker implantation for complete heart block.  I reviewed all procedural and hospitalization records.  He has been experiencing significant shortness of breath more pronounced after having had his surgery. It may occur when walking 50 feet. He has also been having black stools. He said he had a colonoscopy 3 years ago. He is back on warfarin due to a recurrence of atrial flutter noted by his pacemaker.  6/27 INR 1.7.  6/15 hemoglobin 8.1.  Review of Systems: As per "subjective", otherwise negative.  No Known Allergies  Current Outpatient Prescriptions  Medication Sig Dispense Refill  . amLODipine (NORVASC) 10 MG tablet Take 1 tablet (10 mg total) by mouth daily. 30 tablet 1  . aspirin EC 81 MG tablet Take 1 tablet (81 mg total) by mouth daily. 90 tablet 3  . ASTAXANTHIN PO Take 10 mg by mouth daily. Reported on 10/30/2015    . atorvastatin (LIPITOR) 40 MG tablet Take 1 tablet by mouth daily. Reported on 10/30/2015    . Berberine Chloride POWD Take 1,200 mg by mouth daily. Reported on 10/30/2015    . carvedilol (COREG) 12.5 MG tablet Take 1 tablet (12.5 mg total) by mouth 2 (two) times daily. 60 tablet 1  . Coenzyme Q10 (CO Q10) 100 MG CAPS Take 1 tablet by mouth daily. Reported on 10/30/2015    . ferrous Q000111Q C-folic acid (TRINSICON / FOLTRIN) capsule Take 1 capsule by mouth 2 (two) times daily with a meal. 60 capsule 1  . fish oil-omega-3 fatty acids 1000 MG capsule Take 2 g by mouth daily. Reported on 10/30/2015    . furosemide (LASIX) 40 MG tablet Take 1 tablet (40 mg total) by mouth daily. 30 tablet 0  . lisinopril (PRINIVIL,ZESTRIL) 20 MG tablet Take 1 tablet (20 mg total) by mouth daily. 90 tablet 3  . metFORMIN (GLUCOPHAGE) 500 MG tablet Take 1,000 mg by mouth 2  (two) times daily. Reported on 10/30/2015  2  . Multiple Vitamin (MULITIVITAMIN WITH MINERALS) TABS Take 1 tablet by mouth daily. Reported on 10/30/2015    . potassium chloride (K-DUR,KLOR-CON) 10 MEQ tablet Take 1 tablet (10 mEq total) by mouth daily. 30 tablet 0  . warfarin (COUMADIN) 5 MG tablet Take 2 tablets daily or as directed 60 tablet 3   No current facility-administered medications for this visit.    Past Medical History  Diagnosis Date  . CAD (coronary artery disease)     status post Taxus stent patency of RCA 2004, Cardiolite negative for ischemia in 2010.  Marland Kitchen Dyslipidemia   . Hypertension   . Right bundle branch block (RBBB) with left anterior hemiblock   . Typical atrial flutter ([place])     s/p ablation 11-23-2012 by Dr Rayann Heman  . Overweight   . Diabetes mellitus ([place])   . Snores   . History of kidney stones   . Aortic stenosis 10/14/2015  . 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 Mitral regurgitation 10/15/2015  Moderate-severe by intra-operative TEE  . Aortic insufficiency 10/15/2015    mild (1+/2+) by TEE    Past Surgical History  Procedure Laterality Date  . Ankle surgery Left     total ankle replacement  . Ablation  11-23-2012    s/p cavotricuspid isthmus ablation by Dr Rayann Heman  . Coronary angioplasty      3 stents  . Cataract extraction Right   . Cataract extraction w/phaco Left 09/13/2013    Procedure: CATARACT EXTRACTION PHACO AND INTRAOCULAR LENS PLACEMENT (IOC);  Surgeon: Tonny Branch, MD;  Location: [facility] OR[facility];  Service: Ophthalmology;  Laterality: Left;  CDE 9.38  . Atrial flutter ablation N/A 11/23/2012    Procedure: ATRIAL FLUTTER ABLATION;  Surgeon:  Thompson Grayer, MD;  Location: [facility] CATH LAB;  Service: Cardiovascular;  Laterality: N/A;  . Colonoscopy N/A 10/10/2014    Procedure: COLONOSCOPY;  Surgeon: Rogene Houston, MD;  Location: [facility] ENDO SUITE;  Service: Endoscopy;  Laterality: N/A;  830  . Cardiac catheterization N/A 10/13/2015    Procedure: Left Heart Cath and Coronary Angiography;  Surgeon: Peter M Martinique, MD;  Location: [place] CV LAB;  Service: Cardiovascular;  Laterality: N/A;  . Coronary artery bypass graft N/A 10/15/2015    Procedure: CORONARY ARTERY BYPASS GRAFTING (CABG) X4 LIMA-LAD; SEQ SVG-PD-PL; SVG-OM1 ENDOSCOPIC GREATER SAPHENOUS VEIN HARVEST(EVH) BILAT LOWER EXTREM;  Surgeon: Rexene Alberts, MD;  Location: [place];  Service: Open Heart Surgery;  Laterality: N/A;  . Tee without cardioversion N/A 10/15/2015    Procedure: TRANSESOPHAGEAL ECHOCARDIOGRAM (TEE);  Surgeon: Rexene Alberts, MD;  Location: [place];  Service: Open Heart Surgery;  Laterality: N/A;  . Mitral valve repair N/A 10/15/2015    Procedure: MITRAL VALVE REPAIR (MVR), # 28 MEMO 3-D RING ANNULOPLASTY AND COMPLEX VALVE REPAIR;  Surgeon: Rexene Alberts, MD;  Location: [place];  Service: Open Heart Surgery;  Laterality: N/A;  . Ep implantable device N/A 10/20/2015    Procedure: Pacemaker Implant;  Surgeon: Evans Lance, MD;  Location: [place] CV LAB;  Service: Cardiovascular;  Laterality: N/A;    Social History   Social History  . Marital Status: Married    Spouse Name: N/A  . Number of Children: N/A  . 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 Smoking status: Former Smoker -- 1.00 packs/day for 28 years    Types: Cigarettes    Start date: 10/21/1956    Quit date: 05/10/1985  . Smokeless tobacco: Never Used  . Alcohol Use: 0.0 oz/week    0 Standard drinks or equivalent per week     Comment: occasional wine  . Drug Use: No  . Sexual Activity: Yes    Birth Control/ Protection: None    Other Topics Concern  . Not on file   Social History Narrative   Lives in [place] with spouse.   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 Lungs: Clear to auscultation bilaterally with normal respiratory effort. CV: Nondisplaced PMI. Regular rate and rhythm, normal S1/S2, no S3/S4, II/VI early systolic murmur over RUSB. No pretibial or periankle edema.  Abdomen: Soft, nontender, obese, no distention.  Neurologic: Alert and oriented x 3.  Psych: Normal affect. Skin:  Normal. Musculoskeletal: No gross deformities. Extremities: No clubbing or cyanosis.     ECG: Most recent ECG reviewed.      ASSESSMENT AND PLAN: 1. CAD s/p 4-v CABG: Has exertional dyspnea. Check CBC given concern for worsening anemia. For now continue aspirin, Lipitor, Coreg, and lisinopril.  2. S/p mitral valve repair: Stable.  3. S/p PPM for CHB: Stable. F/u with Dr. Lovena Le.  4. Essential HTN: Controlled. No changes.  5. Hyperlipidemia: continue Lipitor.  6. DOE: I am concerned about worsening anemia given Hgb 8.1 on 6/15. Will check CBC.  7. Black stools: Concern for upper GI bleed. Will likely need endoscopy. Check CBC.  8. Anemia: On ferrous sulfate. Will check CBC given concern for worsening anemia. On ASA and warfarin.  9. Atrial flutter recurrence: Now on warfarin.  Dispo: fu 6 weeks.  Time spent: 40 minutes, of which greater than 50% was spent reviewing symptoms, relevant blood tests and studies, and discussing management plan with the patient.   Kate Sable, M.D., F.A.C.C.

## 2015-11-07 NOTE — Patient Instructions (Signed)
Your physician recommends that you schedule a follow-up appointment in: [place]  Your physician recommends that you continue on your current medications as directed. Please refer to the Current Medication list given to you today.  Your physician recommends that you return for lab work CBC  Thank you for [place]!!

## 2015-11-10 ENCOUNTER — Telehealth (INDEPENDENT_AMBULATORY_CARE_PROVIDER_SITE_OTHER): Payer: Medicare Other | Admitting: Cardiovascular Disease

## 2015-11-10 ENCOUNTER — Telehealth: Payer: Self-pay | Admitting: Cardiovascular Disease

## 2015-11-10 DIAGNOSIS — I700 Atherosclerosis of aorta: Secondary | ICD-10-CM | POA: Diagnosis not present

## 2015-11-10 DIAGNOSIS — R0602 Shortness of breath: Secondary | ICD-10-CM

## 2015-11-10 DIAGNOSIS — I517 Cardiomegaly: Secondary | ICD-10-CM | POA: Diagnosis not present

## 2015-11-10 DIAGNOSIS — J9811 Atelectasis: Secondary | ICD-10-CM | POA: Diagnosis not present

## 2015-11-10 DIAGNOSIS — J90 Pleural effusion, not elsewhere classified: Secondary | ICD-10-CM | POA: Diagnosis not present

## 2015-11-10 NOTE — Telephone Encounter (Signed)
Patient notified.  He will go to [facility] this afternoon for chest x-ray.  Will fax order now.

## 2015-11-10 NOTE — Telephone Encounter (Signed)
Hgb actually improved since hospitalization. Would obtain CXR given SOB.

## 2015-11-10 NOTE — Telephone Encounter (Signed)
Patient is calling regarding lab results. / tg

## 2015-11-10 NOTE — Telephone Encounter (Signed)
Labs received today - in chart, will forward to Dr. Bronson Ing

## 2015-11-10 NOTE — Telephone Encounter (Signed)
Mr. Lockler called requesting lab results done on 11/07/15

## 2015-11-10 NOTE — Telephone Encounter (Signed)
This was addressed in previous telephone note today. Pt will have CXR today

## 2015-11-13 ENCOUNTER — Telehealth: Payer: Self-pay | Admitting: *Deleted

## 2015-11-13 ENCOUNTER — Ambulatory Visit (INDEPENDENT_AMBULATORY_CARE_PROVIDER_SITE_OTHER): Payer: Medicare Other | Admitting: *Deleted

## 2015-11-13 DIAGNOSIS — Z9889 Other specified postprocedural states: Secondary | ICD-10-CM | POA: Diagnosis not present

## 2015-11-13 DIAGNOSIS — I4892 Unspecified atrial flutter: Secondary | ICD-10-CM

## 2015-11-13 DIAGNOSIS — Z5181 Encounter for therapeutic drug level monitoring: Secondary | ICD-10-CM

## 2015-11-13 LAB — POCT INR: INR: 3

## 2015-11-13 MED ORDER — FUROSEMIDE 40 MG PO TABS: 40.0000 mg | ORAL_TABLET | Freq: Every day | ORAL | Status: DC

## 2015-11-13 NOTE — Telephone Encounter (Signed)
Notes Recorded by [name], LPN on [id number]-GRM at [id number] PM Patent notified. Stated he is feeling a little better, so will not increase his Lasix at this time. Follow up with Dr. Roxy Manns 11/24/2015 & Dr. Bronson Ing 12/23/2015. Copy to pmd.  Notes Recorded by Herminio Commons, MD on 11/12/2015 at 11:27 AM Mild fluid buildup. Have symptoms improved at all? If not, would increase Lasix to 40 mg bid x 3 days.

## 2015-11-19 ENCOUNTER — Telehealth: Payer: Self-pay | Admitting: Cardiovascular Disease

## 2015-11-19 DIAGNOSIS — I10 Essential (primary) hypertension: Secondary | ICD-10-CM

## 2015-11-19 DIAGNOSIS — Z951 Presence of aortocoronary bypass graft: Secondary | ICD-10-CM

## 2015-11-19 DIAGNOSIS — Z9889 Other specified postprocedural states: Secondary | ICD-10-CM

## 2015-11-19 DIAGNOSIS — D649 Anemia, unspecified: Secondary | ICD-10-CM

## 2015-11-19 NOTE — Telephone Encounter (Signed)
From notes he is also taking iron, that would cause black stools as well. Has he seen any blood specifically. His blood counts from last labs were actually continuing to improve. Has he noticed any blood mixed with stools?   Zandra Abts MD

## 2015-11-19 NOTE — Telephone Encounter (Signed)
We can get a CMET, Mg, cbc for him please   Zandra Abts MD

## 2015-11-19 NOTE — Telephone Encounter (Signed)
Patient notified.  Stated that he has not seen any blood physically in stool.  Sees surgeon on Monday, 11/24/2015.  Is again questioning if he should get blood counts checked prior to that visit.  States he is just not as feeling as good as he has been.

## 2015-11-19 NOTE — Telephone Encounter (Signed)
Patient called in reference to still being SOB.  Would like to know if he needs to repeat hemoglobin lab work due to being SOB

## 2015-11-19 NOTE — Telephone Encounter (Signed)
Patient calling back to see if have answer to below yet.  Explained to patient that Dr. Harl Bowie will review as soon as he can, finishing up with morning clinic.

## 2015-11-19 NOTE — Telephone Encounter (Signed)
Patient called back to state that his stool is still black

## 2015-11-19 NOTE — Telephone Encounter (Signed)
Patient notified.  Will do lab orders & he will pick up in the morning.  He will do at Dr. Trena Platt lab.

## 2015-11-20 DIAGNOSIS — D649 Anemia, unspecified: Secondary | ICD-10-CM | POA: Diagnosis not present

## 2015-11-20 DIAGNOSIS — Z9889 Other specified postprocedural states: Secondary | ICD-10-CM | POA: Diagnosis not present

## 2015-11-20 DIAGNOSIS — I10 Essential (primary) hypertension: Secondary | ICD-10-CM | POA: Diagnosis not present

## 2015-11-20 DIAGNOSIS — Z951 Presence of aortocoronary bypass graft: Secondary | ICD-10-CM | POA: Diagnosis not present

## 2015-11-21 ENCOUNTER — Other Ambulatory Visit: Payer: Self-pay | Admitting: Thoracic Surgery (Cardiothoracic Vascular Surgery)

## 2015-11-21 ENCOUNTER — Other Ambulatory Visit: Payer: Self-pay | Admitting: Surgical

## 2015-11-21 ENCOUNTER — Other Ambulatory Visit: Payer: Self-pay | Admitting: *Deleted

## 2015-11-21 DIAGNOSIS — Z951 Presence of aortocoronary bypass graft: Secondary | ICD-10-CM

## 2015-11-21 MED ORDER — POTASSIUM CHLORIDE CRYS ER 10 MEQ PO TBCR: 10.0000 meq | EXTENDED_RELEASE_TABLET | Freq: Every day | ORAL | Status: DC

## 2015-11-24 ENCOUNTER — Encounter: Payer: Self-pay | Admitting: Thoracic Surgery (Cardiothoracic Vascular Surgery)

## 2015-11-24 ENCOUNTER — Ambulatory Visit (INDEPENDENT_AMBULATORY_CARE_PROVIDER_SITE_OTHER): Payer: Self-pay | Admitting: Thoracic Surgery (Cardiothoracic Vascular Surgery)

## 2015-11-24 ENCOUNTER — Ambulatory Visit
Admission: RE | Admit: 2015-11-24 | Discharge: 2015-11-24 | Disposition: A | Discharge status: Home / Self Care | Payer: Medicare Other | Source: Ambulatory Visit | Attending: Thoracic Surgery (Cardiothoracic Vascular Surgery) | Admitting: Thoracic Surgery (Cardiothoracic Vascular Surgery)

## 2015-11-24 VITALS — BP 145/82 | HR 97 | Resp 16 | Ht 71.0 in | Wt 240.0 lb

## 2015-11-24 DIAGNOSIS — I350 Nonrheumatic aortic (valve) stenosis: Secondary | ICD-10-CM

## 2015-11-24 DIAGNOSIS — Z951 Presence of aortocoronary bypass graft: Secondary | ICD-10-CM

## 2015-11-24 DIAGNOSIS — J90 Pleural effusion, not elsewhere classified: Secondary | ICD-10-CM | POA: Diagnosis not present

## 2015-11-24 DIAGNOSIS — Z9889 Other specified postprocedural states: Secondary | ICD-10-CM

## 2015-11-24 DIAGNOSIS — I351 Nonrheumatic aortic (valve) insufficiency: Secondary | ICD-10-CM

## 2015-11-24 DIAGNOSIS — I340 Nonrheumatic mitral (valve) insufficiency: Secondary | ICD-10-CM

## 2015-11-24 DIAGNOSIS — I25110 Atherosclerotic heart disease of native coronary artery with unstable angina pectoris: Secondary | ICD-10-CM

## 2015-11-24 NOTE — Patient Instructions (Addendum)
Continue to avoid any heavy lifting or strenuous use of your arms or shoulders for at least a total of three months from the time of surgery.  After three months you may gradually increase how much you lift or otherwise use your arms or chest as tolerated, with limits based upon whether or not activities lead to the return of significant discomfort.  Continue all previous medications without any changes at this time  You may return to driving an automobile as long as you are no longer requiring oral narcotic pain relievers during the daytime.  It would be wise to start driving only short distances during the daylight and gradually increase from there as you feel comfortable.  You are encouraged to enroll and participate in the outpatient cardiac rehab program beginning as soon as practical.

## 2015-11-24 NOTE — Progress Notes (Signed)
[address]       [place] [address]             [phone]     CARDIOTHORACIC SURGERY OFFICE NOTE  Referring Provider is Martinique, Ander Slade, MD Primary Cardiologist is Herminio Commons, MD PCP is Monico Blitz, MD   HPI:  Patient is a 75 year old obese white male with coronary artery disease, aortic stenosis, mitral regurgitation, atrial flutter status post ablation, hypertension, type 2 diabetes mellitus, hyperlipidemia, and a strong family history of coronary artery disease who returns to the office for routine follow-up today status post coronary artery bypass grafting 4 and mitral valve repair on 10/15/2015 for severe three-vessel coronary artery disease with unstable angina pectoris and moderate to severe mitral regurgitation.  Preoperative transthoracic echocardiograms suggested the presence of mild aortic stenosis, mild aortic insufficiency, and mild mitral regurgitation. However, intraoperative transesophageal echocardiogram revealed moderate to severe primary mitral regurgitation caused by calcification of the posterior mitral annulus.  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 Postoperatively he required permanent pacemaker placement for complete heart block.  The remainder of his postoperative recovery in the hospital was somewhat slow but overall uncomplicated.  He was ultimately discharged home on 10/24/2015. Since then he has been seen in follow-up by Dr. Bronson Ing.  His prothrombin time has been checked and his Coumadin dose adjusted through the Coumadin clinic. The patient was discharged on an iron supplement and has complained of black stools since discharge. His hemoglobin level has been checked by Dr. Bronson Ing and is reportedly increasing. The patient's wife states that they were confused about his medications and he has not been  receiving lisinopril as instructed. In addition, he has not been taking Lipitor. His weight has gradually decreased since hospital discharge. He still has some mild lower extremity edema but this has improved considerably. He has exertional shortness of breath that continues to gradually improve. He has had minimal soreness in his chest and he has not required pain medications.  He has not had palpitations or dizzy spells. He reports that his blood sugars have been under good control. He admits that he has not been very active physically and he has not been walking regularly.   Current Outpatient Prescriptions  Medication Sig Dispense Refill  . amLODipine (NORVASC) 10 MG tablet Take 1 tablet (10 mg total) by mouth daily. 30 tablet 1  . aspirin EC 81 MG tablet Take 1 tablet (81 mg total) by mouth daily. 90 tablet 3  . ASTAXANTHIN PO Take 10 mg by mouth daily. Reported on 10/30/2015    . Berberine Chloride POWD Take 1,200 mg by mouth daily. Reported on 10/30/2015    . carvedilol (COREG) 12.5 MG tablet Take 1 tablet (12.5 mg total) by mouth 2 (two) times daily. 60 tablet 1  . Coenzyme Q10 (CO Q10) 100 MG CAPS Take 1 tablet by mouth daily. Reported on 10/30/2015    . ferrous Q000111Q C-folic acid (TRINSICON / FOLTRIN) capsule Take 1 capsule by mouth 2 (two) times daily with a meal. 60 capsule 1  . fish oil-omega-3 fatty acids 1000 MG capsule Take 2 g by mouth daily. Reported on 10/30/2015    . furosemide (LASIX) 40 MG tablet Take 1 tablet (40 mg total) by mouth daily. 30 tablet 1  . metFORMIN (GLUCOPHAGE) 500 MG tablet Take 1,000 mg by mouth 2 (two) times daily.  Reported on 10/30/2015  2  . Multiple Vitamin (MULITIVITAMIN WITH MINERALS) TABS Take 1 tablet by mouth daily. Reported on 10/30/2015    . potassium chloride (K-DUR,KLOR-CON) 10 MEQ tablet Take 1 tablet (10 mEq total) by mouth daily. 30 tablet 0  . warfarin (COUMADIN) 5 MG tablet Take 2 tablets daily or as directed 60 tablet 3  .  atorvastatin (LIPITOR) 40 MG tablet Take 1 tablet by mouth daily. Reported on 11/24/2015    . lisinopril (PRINIVIL,ZESTRIL) 20 MG tablet Take 1 tablet (20 mg total) by mouth daily. (Patient not taking: Reported on 11/24/2015) 90 tablet 3   No current facility-administered medications for this visit.      Physical Exam:   BP 145/82 mmHg  Pulse 97  Resp 16  Ht 5\' 11"  (1.803 m)  Wt 240 lb (108.863 kg)  BMI 33.49 kg/m2  SpO2 97%  General:  Obese but well appearing  Chest:   Clear to auscultation  CV:   Regular rate and rhythm with soft systolic murmur heard along the right sternal border  Incisions:  Healing nicely, sternum is stable  Abdomen:  Soft and nontender  Extremities:  Warm and well-perfused with moderate bilateral lower extremity edema  Diagnostic Tests:   CHEST 2 VIEW  COMPARISON: PA and lateral chest x-ray of November 10, 2015  FINDINGS: The lungs are well-expanded the interstitial markings are mildly increased though improved over the previous study. There is a trace of pleural fluid remaining on the right. There is a small stable pleural effusion on the left. The heart is normal in size. The mitral valve repair ring is visible. The central pulmonary vascularity remains prominent. There are post CABG changes. The sternal wires are intact.  IMPRESSION: Persistent small left pleural effusion and trace right pleural effusion. Stable mild central pulmonary vascular prominence. Probable underlying COPD or reactive airway disease.   Electronically Signed  By: David Martinique M.D.  On: 11/24/2015 16:23  Impression:  Patient is progressing reasonably well approximately 5 weeks status post coronary artery bypass grafting and mitral valve repair.  He needs a great deal of encouragement to work on increasing his activity level. He has been reluctant to consider enrolling in the cardiac rehabilitation program.    Plan:  We have not recommended any changes to the  patient's current medications but I have recommended that the patient resume taking lisinopril and Lipitor as they had been prescribed previously. We would recommend that the patient remain on Coumadin for at least another 6 weeks. At that time it might be reasonable to consider stopping it if he continues to maintain sinus rhythm. I have encouraged the patient to gradually increase his activity with his only limitation remaining that he refrain from heavy lifting or strenuous use of his arms or shoulders for at least another 6-8 weeks. We will refer the patient to the outpatient cardiac rehabilitation program through [facility]. The patient has been reminded regarding the importance of close attention to his diabetes management and the multiple potential benefits of weight loss and a regular exercise program. All of his questions have been addressed. At some point we would recommend a routine follow-up echocardiogram be performed. We will leave this to Dr. Court Joy discretion.  We will plan to see the patient back for routine follow-up in 2 months.     Valentina Gu. Roxy Manns, MD 11/24/2015 5:31 PM

## 2015-11-25 ENCOUNTER — Ambulatory Visit (INDEPENDENT_AMBULATORY_CARE_PROVIDER_SITE_OTHER): Payer: Medicare Other | Admitting: *Deleted

## 2015-11-25 DIAGNOSIS — I4892 Unspecified atrial flutter: Secondary | ICD-10-CM | POA: Diagnosis not present

## 2015-11-25 DIAGNOSIS — Z9889 Other specified postprocedural states: Secondary | ICD-10-CM

## 2015-11-25 DIAGNOSIS — Z5181 Encounter for therapeutic drug level monitoring: Secondary | ICD-10-CM

## 2015-11-25 LAB — POCT INR: INR: 2.3

## 2015-11-26 ENCOUNTER — Encounter (HOSPITAL_COMMUNITY)
Admission: RE | Admit: 2015-11-26 | Discharge: 2015-11-26 | Disposition: A | Discharge status: Home / Self Care | Payer: Medicare Other | Source: Ambulatory Visit | Attending: Cardiovascular Disease | Admitting: Cardiovascular Disease

## 2015-11-26 VITALS — BP 140/68 | HR 86 | Ht 71.0 in | Wt 235.0 lb

## 2015-11-26 DIAGNOSIS — I214 Non-ST elevation (NSTEMI) myocardial infarction: Secondary | ICD-10-CM | POA: Diagnosis not present

## 2015-11-26 DIAGNOSIS — Z9889 Other specified postprocedural states: Secondary | ICD-10-CM | POA: Diagnosis not present

## 2015-11-26 NOTE — Progress Notes (Signed)
Cardiac Individual Treatment Plan  Patient Details  Name: Christian Rasmussen MRN: [id number] Date of Birth: 1940-12-22 Referring Provider:        [facility] from 11/26/2015 in [place]   Referring Provider  Dr. Bronson Ing      Initial Encounter Date:       CARDIAC REHAB PHASE II ORIENTATION from 11/26/2015 in [place]   Date  11/26/15   Referring Provider  Dr. Bronson Ing      Visit Diagnosis: NSTEMI (non-ST elevated myocardial infarction) ([place])  S/P mitral valve repair  Patient's Home Medications on Admission:  Current outpatient prescriptions:  .  amLODipine (NORVASC) 10 MG tablet, Take 1 tablet (10 mg total) by mouth daily., Disp: 30 tablet, Rfl: 1 .  aspirin EC 81 MG tablet, Take 1 tablet (81 mg total) by mouth daily., Disp: 90 tablet, Rfl: 3 .  ASTAXANTHIN PO, Take 10 mg by mouth daily. Reported on 10/30/2015, Disp: , Rfl:  .  atorvastatin (LIPITOR) 40 MG tablet, Take 1 tablet by mouth daily. Reported on 11/24/2015, Disp: , Rfl:  .  Berberine Chloride POWD, Take 1,200 mg by mouth daily. Reported on 10/30/2015, Disp: , Rfl:  .  carvedilol (COREG) 12.5 MG tablet, Take 1 tablet (12.5 mg total) by mouth 2 (two) times daily., Disp: 60 tablet, Rfl: 1 .  Coenzyme Q10 (CO Q10) 100 MG CAPS, Take 1 tablet by mouth daily. Reported on 10/30/2015, Disp: , Rfl:  .  ferrous [id number] C-folic acid (TRINSICON / FOLTRIN) capsule, Take 1 capsule by mouth 2 (two) times daily with a meal., Disp: 60 capsule, Rfl: 1 .  fish oil-omega-3 fatty acids 1000 MG capsule, Take 2 g by mouth daily. Reported on 10/30/2015, Disp: , Rfl:  .  furosemide (LASIX) 40 MG tablet, Take 1 tablet (40 mg total) by mouth daily., Disp: 30 tablet, Rfl: 1 .  lisinopril (PRINIVIL,ZESTRIL) 20 MG tablet, Take 1 tablet (20 mg total) by mouth daily., Disp: 90 tablet, Rfl: 3 .  metFORMIN (GLUCOPHAGE) 500 MG tablet, Take 1,000 mg by mouth 2 (two) times daily with  a meal. Reported on 10/30/2015, Disp: , Rfl: 2 .  Multiple Vitamin (MULITIVITAMIN WITH MINERALS) TABS, Take 1 tablet by mouth daily. Reported on 10/30/2015, Disp: , Rfl:  .  potassium chloride (K-DUR) 10 MEQ tablet, Take 10 mEq by mouth daily., Disp: , Rfl:  .  potassium chloride (K-DUR,KLOR-CON) 10 MEQ tablet, Take 1 tablet (10 mEq total) by mouth daily., Disp: 30 tablet, Rfl: 0 .  warfarin (COUMADIN) 5 MG tablet, Take 2 tablets daily or as directed, Disp: 60 tablet, Rfl: 3  Past Medical History: Past Medical History  Diagnosis Date  . CAD (coronary artery disease)     status post Taxus stent patency of RCA 2004, Cardiolite negative for ischemia in 2010.  Marland Kitchen Dyslipidemia   . Hypertension   . Right bundle branch block (RBBB) with left anterior hemiblock   . Typical atrial flutter ([place])     s/p ablation 11-23-2012 by Dr Rayann Heman  . Overweight   . Diabetes mellitus ([place])   . Snores   . History of kidney stones   . Aortic stenosis 10/14/2015  . 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 Mitral regurgitation 10/15/2015    Moderate-severe by intra-operative TEE  . Aortic insufficiency 10/15/2015    mild (1+/2+) by TEE    Tobacco Use: History  Smoking status  . Former Smoker -- 1.00 packs/day for 28 years  . Types: Cigarettes  . Start date: 10/21/1956  . Quit date: 05/10/1985  Smokeless tobacco  . Never Used    Labs:     Recent Review Flowsheet Data    Labs for ITP Cardiac and Pulmonary Rehab Latest Ref Rng 10/16/2015 10/16/2015 10/16/2015 10/16/2015 10/18/2015   PHART 7.350 - 7.450 - 7.394 7.415 - -   PCO2ART 35.0 - 45.0 mmHg - 35.5 36.2 - -   HCO3 20.0 - 24.0 mEq/L - 21.6 23.2 -  -   TCO2 0 - 100 mmol/L 22 23 24 20 26    ACIDBASEDEF 0.0 - 2.0 mmol/L - 3.0(H) 1.0 - -   O2SAT - - 96.0 97.0 - -      Capillary Blood Glucose: Lab Results  Component Value Date   GLUCAP 109* 10/24/2015   GLUCAP 130* 10/24/2015   GLUCAP 125* 10/23/2015   GLUCAP 154* 10/23/2015   GLUCAP 138* 10/23/2015     Exercise Target Goals: Date: 11/26/15  Exercise Program Goal: Individual exercise prescription set with THRR, safety & activity barriers. Participant demonstrates ability to understand and report RPE using BORG scale, to self-measure pulse accurately, and to acknowledge the importance of the exercise prescription.  Exercise Prescription Goal: Starting with aerobic activity 30 plus minutes a day, 3 days per week for initial exercise prescription. Provide home exercise prescription and guidelines that participant acknowledges understanding prior to discharge.  Activity Barriers & Risk Stratification:     Activity Barriers & Cardiac Risk Stratification - 11/26/15 1456    Activity Barriers & Cardiac Risk Stratification   Activity Barriers None   Cardiac Risk Stratification High      6 Minute Walk:     6 Minute Walk      11/26/15 1453       6 Minute Walk   Phase Initial     Distance 1150 feet     Walk Time 6 minutes     # of Rest Breaks 0     MPH 2.17     METS 2.66     RPE 15     Perceived Dyspnea  14     VO2 Peak 9.8     Symptoms No     Resting HR 86 bpm     Resting BP 140/68 mmHg     Max Ex. HR 118 bpm     Max Ex. BP 180/84 mmHg     2 Minute Post BP 144/70 mmHg        Initial Exercise Prescription:     Initial Exercise Prescription - 11/26/15 1400    Date of Initial Exercise RX and Referring Provider   Date 11/26/15   Referring Provider Dr. Bronson Ing   Treadmill   MPH 1.3   Grade 0   Minutes 15   METs 1.9   NuStep   Level 2   Watts 15   Minutes 20   METs 1.9   Prescription Details   Frequency (times per week) 3   Duration Progress to 30  minutes of continuous aerobic without signs/symptoms of physical distress   Intensity   THRR REST +  30   THRR 40-80% of Max Heartrate [phone]  Ratings of Perceived Exertion 11-13   Perceived Dyspnea 0-4   Progression   Progression Continue to progress workloads to maintain intensity without signs/symptoms of physical distress.   Resistance Training   Training Prescription Yes   Weight 1   Reps 10-12      Perform Capillary Blood Glucose checks as needed.  Exercise Prescription Changes:   Exercise Comments:    Discharge Exercise Prescription (Final Exercise Prescription Changes):   Nutrition:  Target Goals: Understanding of nutrition guidelines, daily intake of sodium 1500mg , cholesterol 200mg , calories 30% from fat and 7% or less from saturated fats, daily to have 5 or more servings of fruits and vegetables.  Biometrics:     Pre Biometrics - 11/26/15 1455    Pre Biometrics   Height 5\' 11"  (1.803 m)   Weight 235 lb 0.2 oz (106.6 kg)   Waist Circumference 46.5 inches   Hip Circumference 43.75 inches   Waist to Hip Ratio 1.06 %   BMI (Calculated) 32.8   Triceps Skinfold 17 mm   % Body Fat 32.8 %   Grip Strength 77.6 kg   Flexibility 16.5 in   Single Leg Stand 2 seconds       Nutrition Therapy Plan and Nutrition Goals:   Nutrition Discharge: Rate Your Plate Scores:     Nutrition Assessments - 11/26/15 1510    MEDFICTS Scores   Pre Score 46      Nutrition Goals Re-Evaluation:   Psychosocial: Target Goals: Acknowledge presence or absence of depression, maximize coping skills, provide positive support system. Participant is able to verbalize types and ability to use techniques and skills needed for reducing stress and depression.  Initial Review & Psychosocial Screening:     Initial Psych Review & Screening - 11/26/15 1514    Initial Review   Current issues with --  He has not issues.   Family Dynamics   Good Support System? Yes   Barriers    Psychosocial barriers to participate in program There are no identifiable barriers or psychosocial needs.   Screening Interventions   Interventions Encouraged to exercise      Quality of Life Scores:     Quality of Life - 11/26/15 1456    Quality of Life Scores   Health/Function Pre 22.6 %   Socioeconomic Pre 26.75 %   Psych/Spiritual Pre 29.29 %   Family Pre 26.4 %   GLOBAL Pre 25.43 %      PHQ-9:     Recent Review Flowsheet Data    Depression screen [facility] 2/9 11/26/2015   Decreased Interest 0   Down, Depressed, Hopeless 0   PHQ - 2 Score 0   Altered sleeping 0   Tired, decreased energy 1   Change in appetite 1   Trouble concentrating 0   Moving slowly or fidgety/restless 0   Suicidal thoughts 0   PHQ-9 Score 2   Difficult doing work/chores Not difficult at all      Psychosocial Evaluation and Intervention:     Psychosocial Evaluation - 11/26/15 1515    Psychosocial Evaluation & Interventions   Interventions Encouraged to exercise with the program and follow exercise prescription   Continued Psychosocial Services Needed No      Psychosocial Re-Evaluation:   Vocational Rehabilitation: Provide vocational rehab assistance to qualifying candidates.   Vocational Rehab Evaluation & Intervention:     Vocational Rehab - 11/26/15 1500    Initial Vocational Rehab Evaluation & Intervention   Assessment shows need for Vocational Rehabilitation  No      Education: Education Goals: Education classes will be provided on a weekly basis, covering required topics. Participant will state understanding/return demonstration of topics presented.  Learning Barriers/Preferences:     Learning Barriers/Preferences - 11/26/15 1457    Learning Barriers/Preferences   Learning Barriers None   Learning Preferences Written Material;Skilled Demonstration      Education Topics: Hypertension, Hypertension Reduction -Define heart disease and high blood pressure. Discus how  high blood pressure affects the body and ways to reduce high blood pressure.   Exercise and Your Heart -Discuss why it is important to exercise, the FITT principles of exercise, normal and abnormal responses to exercise, and how to exercise safely.   Angina -Discuss definition of angina, causes of angina, treatment of angina, and how to decrease risk of having angina.   Cardiac Medications -Review what the following cardiac medications are used for, how they affect the body, and side effects that may occur when taking the medications.  Medications include Aspirin, Beta blockers, calcium channel blockers, ACE Inhibitors, angiotensin receptor blockers, diuretics, digoxin, and antihyperlipidemics.   Congestive Heart Failure -Discuss the definition of CHF, how to live with CHF, the signs and symptoms of CHF, and how keep track of weight and sodium intake.   Heart Disease and Intimacy -Discus the effect sexual activity has on the heart, how changes occur during intimacy as we age, and safety during sexual activity.   Smoking Cessation / COPD -Discuss different methods to quit smoking, the health benefits of quitting smoking, and the definition of COPD.   Nutrition I: Fats -Discuss the types of cholesterol, what cholesterol does to the heart, and how cholesterol levels can be controlled.   Nutrition II: Labels -Discuss the different components of food labels and how to read food label   Heart Parts and Heart Disease -Discuss the anatomy of the heart, the pathway of blood circulation through the heart, and these are affected by heart disease.   Stress I: Signs and Symptoms -Discuss the causes of stress, how stress may lead to anxiety and depression, and ways to limit stress.   Stress II: Relaxation -Discuss different types of relaxation techniques to limit stress.   Warning Signs of Stroke / TIA -Discuss definition of a stroke, what the signs and symptoms are of a stroke, and  how to identify when someone is having stroke.   Knowledge Questionnaire Score:     Knowledge Questionnaire Score - 11/26/15 1457    Knowledge Questionnaire Score   Pre Score 19/24      Core Components/Risk Factors/Patient Goals at Admission:     Personal Goals and Risk Factors at Admission - 11/26/15 1511    Core Components/Risk Factors/Patient Goals on Admission    Weight Management Weight Maintenance   Increase Strength and Stamina Yes   Intervention Provide advice, education, support and counseling about physical activity/exercise needs.;Develop an individualized exercise prescription for aerobic and resistive training based on initial evaluation findings, risk stratification, comorbidities and participant's personal goals.   Expected Outcomes Achievement of increased cardiorespiratory fitness and enhanced flexibility, muscular endurance and strength shown through measurements of functional capacity and personal statement of participant.   Diabetes Yes   Intervention Provide education about signs/symptoms and action to take for hypo/hyperglycemia.   Expected Outcomes Long Term: Attainment of HbA1C < 7%.   Personal Goal Other Yes   Personal Goal Improve SOB, Be able to do as much as I can.    Intervention Come to CR 3  days/week and supplement with 2 mor days at home.    Expected Outcomes To reach personal goals.       Core Components/Risk Factors/Patient Goals Review:      Goals and Risk Factor Review      11/26/15 1712           Core Components/Risk Factors/Patient Goals Review   Personal Goals Review Increase Strength and Stamina;Other  Be able to do more activities       Expected Outcomes To decrease SOB, and to increase acitivities          Core Components/Risk Factors/Patient Goals at Discharge (Final Review):      Goals and Risk Factor Review - 11/26/15 1712    Core Components/Risk Factors/Patient Goals Review   Personal Goals Review Increase Strength and  Stamina;Other  Be able to do more activities   Expected Outcomes To decrease SOB, and to increase acitivities      ITP Comments:   Comments: Patient arrived for 1st visit/orientation/education at 1230. Patient was referred to CR by Dr. Bronson Ing due to NSTEMI (I21.4) and MVR [id number]). During orientation advised patient on arrival and appointment times what to wear, what to do before, during and after exercise. Reviewed attendance and class policy. Talked about inclement weather and class consultation policy. Pt is scheduled to return Cardiac Rehab on 12/01/15 at 3:45. Pt was advised to come to class 5 minutes before class starts. He was also given instructions on meeting with the dietician and attending the Family Structure classes. Pt is eager to get started. Patient was able to complete 6 minute walk test. Patient was measured for the equipment. Discussed equipment safety with patient. Took patient pre-anthropometric measurements. Patient finished visit at 3:10pm.

## 2015-11-26 NOTE — Progress Notes (Signed)
Cardiac/Pulmonary Rehab Medication Review by a Pharmacist  Does the patient  feel that his/her medications are working for him/her?  yes  Has the patient been experiencing any side effects to the medications prescribed?  no  Does the patient measure his/her own blood pressure or blood glucose at home?  yes   Does the patient have any problems obtaining medications due to transportation or finances?   no  Understanding of regimen: excellent Understanding of indications: excellent Potential of compliance: excellent  Pharmacist comments: Patient understands his medication regimen very well. Continue to monitor blood sugar and blood pressure. He is compliant with his medication regimen and does not have any problem with obtaining his medications.  Thanks for the opportunity to participate in the care of this patient,  Isac Sarna, BS Vena [place], [place] Pharmacist Pager [phone] 11/26/2015 2:03 PM

## 2015-12-01 ENCOUNTER — Encounter (HOSPITAL_COMMUNITY): Payer: Medicare Other

## 2015-12-01 ENCOUNTER — Encounter (HOSPITAL_COMMUNITY)
Admission: RE | Admit: 2015-12-01 | Discharge: 2015-12-01 | Disposition: A | Discharge status: Home / Self Care | Payer: Medicare Other | Source: Ambulatory Visit | Attending: Cardiovascular Disease | Admitting: Cardiovascular Disease

## 2015-12-01 DIAGNOSIS — I214 Non-ST elevation (NSTEMI) myocardial infarction: Secondary | ICD-10-CM | POA: Diagnosis not present

## 2015-12-01 DIAGNOSIS — Z9889 Other specified postprocedural states: Secondary | ICD-10-CM | POA: Diagnosis not present

## 2015-12-03 ENCOUNTER — Encounter (HOSPITAL_COMMUNITY)
Admission: RE | Admit: 2015-12-03 | Discharge: 2015-12-03 | Disposition: A | Discharge status: Home / Self Care | Payer: Medicare Other | Source: Ambulatory Visit | Attending: Cardiovascular Disease | Admitting: Cardiovascular Disease

## 2015-12-03 ENCOUNTER — Encounter (HOSPITAL_COMMUNITY): Payer: Medicare Other

## 2015-12-03 DIAGNOSIS — Z9889 Other specified postprocedural states: Secondary | ICD-10-CM

## 2015-12-03 DIAGNOSIS — I214 Non-ST elevation (NSTEMI) myocardial infarction: Secondary | ICD-10-CM

## 2015-12-03 NOTE — Progress Notes (Signed)
Daily Session Note  Patient Details  Name: Christian Rasmussen MRN: [id number] Date of Birth: [birth year] Referring Provider:   Flowsheet Row CARDIAC REHAB PHASE II ORIENTATION from 11/26/2015 in [place]  Referring Provider  Dr. Bronson Ing      Encounter Date: 12/03/2015  Check In:     Session Check In - 12/03/15 1545      Check-In   Location [facility]   Staff Present Suzanne Boron, BS, EP, Exercise Physiologist;Jasai Sorg Wynetta Emery, RN, BSN   Supervising physician immediately available to respond to emergencies See telemetry face sheet for immediately available MD   Medication changes reported     No   Fall or balance concerns reported    No   Warm-up and Cool-down Performed as group-led instruction   Resistance Training Performed Yes   VAD Patient? No     Pain Assessment   Currently in Pain? No/denies   Pain Score 0-No pain   Multiple Pain Sites No      Capillary Blood Glucose: No results found for this or any previous visit (from the past 24 hour(s)).   Goals Met:  Independence with exercise equipment Exercise tolerated well No report of cardiac concerns or symptoms Strength training completed today  Goals Unmet:  Not Applicable  Comments: Check out 1645.   Dr. Kate Sable is Medical Director for [facility] Cardiac and Pulmonary Rehab.

## 2015-12-04 ENCOUNTER — Telehealth: Payer: Self-pay | Admitting: Cardiovascular Disease

## 2015-12-04 ENCOUNTER — Ambulatory Visit (INDEPENDENT_AMBULATORY_CARE_PROVIDER_SITE_OTHER): Payer: Medicare Other | Admitting: *Deleted

## 2015-12-04 ENCOUNTER — Other Ambulatory Visit: Payer: Self-pay | Admitting: Cardiovascular Disease

## 2015-12-04 DIAGNOSIS — I4892 Unspecified atrial flutter: Secondary | ICD-10-CM | POA: Diagnosis not present

## 2015-12-04 DIAGNOSIS — N390 Urinary tract infection, site not specified: Secondary | ICD-10-CM | POA: Diagnosis not present

## 2015-12-04 DIAGNOSIS — R319 Hematuria, unspecified: Secondary | ICD-10-CM | POA: Diagnosis not present

## 2015-12-04 DIAGNOSIS — Z5181 Encounter for therapeutic drug level monitoring: Secondary | ICD-10-CM

## 2015-12-04 DIAGNOSIS — Z299 Encounter for prophylactic measures, unspecified: Secondary | ICD-10-CM | POA: Diagnosis not present

## 2015-12-04 DIAGNOSIS — Z9889 Other specified postprocedural states: Secondary | ICD-10-CM | POA: Diagnosis not present

## 2015-12-04 LAB — POCT INR: INR: 3.1

## 2015-12-04 NOTE — Progress Notes (Signed)
Cardiac Individual Treatment Plan  Patient Details  Name: Christian Rasmussen MRN: [id number] Date of Birth: [birth year] Referring Provider:   Flowsheet Row CARDIAC REHAB PHASE II ORIENTATION from 11/26/2015 in [place]  Referring Provider  Dr. Bronson Ing      Initial Encounter Date:  Flowsheet Row CARDIAC REHAB PHASE II ORIENTATION from 11/26/2015 in [place]  Date  11/26/15  Referring Provider  Dr. Bronson Ing      Visit Diagnosis: NSTEMI (non-ST elevated myocardial infarction) ([place])  S/P mitral valve repair  Patient's Home Medications on Admission:  Current Outpatient Prescriptions:  .  amLODipine (NORVASC) 10 MG tablet, Take 1 tablet (10 mg total) by mouth daily., Disp: 30 tablet, Rfl: 1 .  aspirin EC 81 MG tablet, Take 1 tablet (81 mg total) by mouth daily., Disp: 90 tablet, Rfl: 3 .  ASTAXANTHIN PO, Take 10 mg by mouth daily. Reported on 10/30/2015, Disp: , Rfl:  .  atorvastatin (LIPITOR) 40 MG tablet, Take 1 tablet by mouth daily. Reported on 11/24/2015, Disp: , Rfl:  .  Berberine Chloride POWD, Take 1,200 mg by mouth daily. Reported on 10/30/2015, Disp: , Rfl:  .  carvedilol (COREG) 12.5 MG tablet, Take 1 tablet (12.5 mg total) by mouth 2 (two) times daily., Disp: 60 tablet, Rfl: 1 .  carvedilol (COREG) 6.25 MG tablet, Take 2 tablets (12.5 mg total) by mouth 2 (two) times daily., Disp: 360 tablet, Rfl: 3 .  Coenzyme Q10 (CO Q10) 100 MG CAPS, Take 1 tablet by mouth daily. Reported on 10/30/2015, Disp: , Rfl:  .  ferrous [id number] C-folic acid (TRINSICON / FOLTRIN) capsule, Take 1 capsule by mouth 2 (two) times daily with a meal., Disp: 60 capsule, Rfl: 1 .  fish oil-omega-3 fatty acids 1000 MG capsule, Take 2 g by mouth daily. Reported on 10/30/2015, Disp: , Rfl:  .  furosemide (LASIX) 40 MG tablet, Take 1 tablet (40 mg total) by mouth daily., Disp: 30 tablet, Rfl: 1 .  lisinopril (PRINIVIL,ZESTRIL) 20 MG tablet, Take 1 tablet (20  mg total) by mouth daily., Disp: 90 tablet, Rfl: 3 .  metFORMIN (GLUCOPHAGE) 500 MG tablet, Take 1,000 mg by mouth 2 (two) times daily with a meal. Reported on 10/30/2015, Disp: , Rfl: 2 .  Multiple Vitamin (MULITIVITAMIN WITH MINERALS) TABS, Take 1 tablet by mouth daily. Reported on 10/30/2015, Disp: , Rfl:  .  potassium chloride (K-DUR) 10 MEQ tablet, Take 10 mEq by mouth daily., Disp: , Rfl:  .  potassium chloride (K-DUR,KLOR-CON) 10 MEQ tablet, Take 1 tablet (10 mEq total) by mouth daily., Disp: 30 tablet, Rfl: 0 .  warfarin (COUMADIN) 5 MG tablet, Take 2 tablets daily or as directed, Disp: 60 tablet, Rfl: 3  Past Medical History: Past Medical History:  Diagnosis Date  . 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 Aortic stenosis 10/14/2015  . CAD (coronary artery disease)    status post Taxus stent patency of RCA 2004, Cardiolite negative for ischemia in 2010.  . Diabetes mellitus ([place])   . Dyslipidemia   . History of kidney stones   . Hypertension   . Mitral regurgitation 10/15/2015   Moderate-severe by intra-operative TEE  . Overweight   . Right bundle branch block (RBBB) with left anterior hemiblock   . 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 Snores   . Typical atrial flutter [facility])    s/p ablation 11-23-2012 by Dr Rayann Heman    Tobacco Use: History  Smoking Status  . Former Smoker  . Packs/day: 1.00  . Years: 28.00  . Types: Cigarettes  . Start date: 10/21/1956  . Quit date: 05/10/1985  Smokeless Tobacco  . Never Used    Labs: Recent Review Flowsheet Data    Labs for ITP Cardiac and Pulmonary Rehab Latest Ref Rng & Units 10/16/2015 10/16/2015 10/16/2015  10/16/2015 10/18/2015   Cholestrol 0 - 200 mg/dL - - - - -   LDLCALC 0 - 99 mg/dL - - - - -   HDL >40 mg/dL - - - - -   Trlycerides <150 mg/dL - - - - -   Hemoglobin A1c 4.8 - 5.6 % - - - - -   PHART 7.350 - 7.450 - 7.394 7.415 - -   PCO2ART 35.0 - 45.0 mmHg - 35.5 36.2 - -   HCO3 20.0 - 24.0 mEq/L - 21.6 23.2 - -   TCO2 0 - 100 mmol/L 22 23 24 20 26    ACIDBASEDEF 0.0 - 2.0 mmol/L - 3.0(H) 1.0 - -   O2SAT % - 96.0 97.0 - -      Capillary Blood Glucose: Lab Results  Component Value Date   GLUCAP 109 (H) 10/24/2015   GLUCAP 130 (H) 10/24/2015   GLUCAP 125 (H) 10/23/2015   GLUCAP 154 (H) 10/23/2015   GLUCAP 138 (H) 10/23/2015     Exercise Target Goals:    Exercise Program Goal: Individual exercise prescription set with THRR, safety & activity barriers. Participant demonstrates ability to understand and report RPE using BORG scale, to self-measure pulse accurately, and to acknowledge the importance of the exercise prescription.  Exercise Prescription Goal: Starting with aerobic activity 30 plus minutes a day, 3 days per week for initial exercise prescription. Provide home exercise prescription and guidelines that participant acknowledges understanding prior to discharge.  Activity Barriers & Risk Stratification:     Activity Barriers & Cardiac Risk Stratification - 11/26/15 1456      Activity Barriers & Cardiac Risk Stratification   Activity Barriers None   Cardiac Risk Stratification High      6 Minute Walk:     6 Minute Walk    Row Name 11/26/15 1453         6 Minute Walk   Phase Initial     Distance 1150 feet     Walk Time 6 minutes     # of Rest Breaks 0     MPH 2.17     METS 2.66     RPE 15     Perceived Dyspnea  14     VO2 Peak 9.8     Symptoms No     Resting HR 86 bpm     Resting BP 140/68     Max Ex. HR 118 bpm     Max Ex. BP 180/84     2 Minute Post BP 144/70        Initial Exercise Prescription:     Initial Exercise Prescription -  11/26/15 1400      Date of Initial Exercise RX and Referring Provider   Date 11/26/15   Referring Provider Dr. Bronson Ing     Treadmill   MPH 1.3  Grade 0   Minutes 15   METs 1.9     NuStep   Level 2   Watts 15   Minutes 20   METs 1.9     Prescription Details   Frequency (times per week) 3   Duration Progress to 30 minutes of continuous aerobic without signs/symptoms of physical distress     Intensity   THRR REST +  30   THRR 40-80% of Max Heartrate [id number]   Ratings of Perceived Exertion 11-13   Perceived Dyspnea 0-4     Progression   Progression Continue to progress workloads to maintain intensity without signs/symptoms of physical distress.     Resistance Training   Training Prescription Yes   Weight 1   Reps 10-12      Perform Capillary Blood Glucose checks as needed.  Exercise Prescription Changes:      Exercise Prescription Changes    Row Name 12/02/15 1200             Exercise Review   Progression Yes         Response to Exercise   Blood Pressure (Admit) 122/62       Blood Pressure (Exercise) 118/60       Blood Pressure (Exit) 110/54       Heart Rate (Admit) 90 bpm       Heart Rate (Exercise) 111 bpm       Heart Rate (Exit) 96 bpm       Rating of Perceived Exertion (Exercise) 13       Duration Progress to 30 minutes of continuous aerobic without signs/symptoms of physical distress       Intensity Rest + 30         Progression   Progression Continue progressive overload as per policy without signs/symptoms or physical distress.         Resistance Training   Training Prescription Yes       Weight 1       Reps 10-12         Treadmill   MPH 0       Grade 0       Minutes 0       METs 0         NuStep   Level 2       Watts 25       Minutes 15       METs 1.9         Recumbant Elliptical   Level 1       RPM 37       Watts 46       Minutes 20       METs 3.69         Home Exercise Plan   Plans to continue exercise at Home        Frequency Add 2 additional days to program exercise sessions.          Exercise Comments:      Exercise Comments    Row Name 12/03/15 0856           Exercise Comments [place] is proggressing appropriately            Discharge Exercise Prescription (Final Exercise Prescription Changes):     Exercise Prescription Changes - 12/02/15 1200      Exercise Review   Progression Yes     Response to Exercise   Blood Pressure (Admit) 122/62   Blood Pressure (Exercise) 118/60  Blood Pressure (Exit) 110/54   Heart Rate (Admit) 90 bpm   Heart Rate (Exercise) 111 bpm   Heart Rate (Exit) 96 bpm   Rating of Perceived Exertion (Exercise) 13   Duration Progress to 30 minutes of continuous aerobic without signs/symptoms of physical distress   Intensity Rest + 30     Progression   Progression Continue progressive overload as per policy without signs/symptoms or physical distress.     Resistance Training   Training Prescription Yes   Weight 1   Reps 10-12     Treadmill   MPH 0   Grade 0   Minutes 0   METs 0     NuStep   Level 2   Watts 25   Minutes 15   METs 1.9     Recumbant Elliptical   Level 1   RPM 37   Watts 46   Minutes 20   METs 3.69     Home Exercise Plan   Plans to continue exercise at Home   Frequency Add 2 additional days to program exercise sessions.      Nutrition:  Target Goals: Understanding of nutrition guidelines, daily intake of sodium 1500mg , cholesterol 200mg , calories 30% from fat and 7% or less from saturated fats, daily to have 5 or more servings of fruits and vegetables.  Biometrics:     Pre Biometrics - 11/26/15 1455      Pre Biometrics   Height 5\' 11"  (1.803 m)   Weight 235 lb 0.2 oz (106.6 kg)   Waist Circumference 46.5 inches   Hip Circumference 43.75 inches   Waist to Hip Ratio 1.06 %   BMI (Calculated) 32.8   Triceps Skinfold 17 mm   % Body Fat 32.8 %   Grip Strength 77.6 kg   Flexibility 16.5 in   Single Leg  Stand 2 seconds       Nutrition Therapy Plan and Nutrition Goals:   Nutrition Discharge: Rate Your Plate Scores:     Nutrition Assessments - 11/26/15 1510      MEDFICTS Scores   Pre Score 46      Nutrition Goals Re-Evaluation:   Psychosocial: Target Goals: Acknowledge presence or absence of depression, maximize coping skills, provide positive support system. Participant is able to verbalize types and ability to use techniques and skills needed for reducing stress and depression.  Initial Review & Psychosocial Screening:     Initial Psych Review & Screening - 11/26/15 1514      Initial Review   Current issues with --  He has not issues.     Family Dynamics   Good Support System? Yes     Barriers   Psychosocial barriers to participate in program There are no identifiable barriers or psychosocial needs.     Screening Interventions   Interventions Encouraged to exercise      Quality of Life Scores:     Quality of Life - 11/26/15 1456      Quality of Life Scores   Health/Function Pre 22.6 %   Socioeconomic Pre 26.75 %   Psych/Spiritual Pre 29.29 %   Family Pre 26.4 %   GLOBAL Pre 25.43 %      PHQ-9: Recent Review Flowsheet Data    Depression screen [facility] 2/9 11/26/2015   Decreased Interest 0   Down, Depressed, Hopeless 0   PHQ - 2 Score 0   Altered sleeping 0   Tired, decreased energy 1   Change in appetite 1   Trouble  concentrating 0   Moving slowly or fidgety/restless 0   Suicidal thoughts 0   PHQ-9 Score 2   Difficult doing work/chores Not difficult at all      Psychosocial Evaluation and Intervention:     Psychosocial Evaluation - 11/26/15 1515      Psychosocial Evaluation & Interventions   Interventions Encouraged to exercise with the program and follow exercise prescription   Continued Psychosocial Services Needed No      Psychosocial Re-Evaluation:     Psychosocial Re-Evaluation    Deport Name 12/04/15 1343              Psychosocial Re-Evaluation   Comments Patient's QOL score was 25.43 and PHQ-9 score was 2. He is not depressed and does not need counseling.        Continued Psychosocial Services Needed No          Vocational Rehabilitation: Provide vocational rehab assistance to qualifying candidates.   Vocational Rehab Evaluation & Intervention:     Vocational Rehab - 11/26/15 1500      Initial Vocational Rehab Evaluation & Intervention   Assessment shows need for Vocational Rehabilitation No      Education: Education Goals: Education classes will be provided on a weekly basis, covering required topics. Participant will state understanding/return demonstration of topics presented.  Learning Barriers/Preferences:     Learning Barriers/Preferences - 11/26/15 1457      Learning Barriers/Preferences   Learning Barriers None   Learning Preferences Written Material;Skilled Demonstration      Education Topics: Hypertension, Hypertension Reduction -Define heart disease and high blood pressure. Discus how high blood pressure affects the body and ways to reduce high blood pressure.   Exercise and Your Heart -Discuss why it is important to exercise, the FITT principles of exercise, normal and abnormal responses to exercise, and how to exercise safely.   Angina -Discuss definition of angina, causes of angina, treatment of angina, and how to decrease risk of having angina.   Cardiac Medications -Review what the following cardiac medications are used for, how they affect the body, and side effects that may occur when taking the medications.  Medications include Aspirin, Beta blockers, calcium channel blockers, ACE Inhibitors, angiotensin receptor blockers, diuretics, digoxin, and antihyperlipidemics.   Congestive Heart Failure -Discuss the definition of CHF, how to live with CHF, the signs and symptoms of CHF, and how keep track of weight and sodium intake.   Heart Disease and  Intimacy -Discus the effect sexual activity has on the heart, how changes occur during intimacy as we age, and safety during sexual activity.   Smoking Cessation / COPD -Discuss different methods to quit smoking, the health benefits of quitting smoking, and the definition of COPD.   Nutrition I: Fats -Discuss the types of cholesterol, what cholesterol does to the heart, and how cholesterol levels can be controlled.   Nutrition II: Labels -Discuss the different components of food labels and how to read food label   Heart Parts and Heart Disease -Discuss the anatomy of the heart, the pathway of blood circulation through the heart, and these are affected by heart disease.   Stress I: Signs and Symptoms -Discuss the causes of stress, how stress may lead to anxiety and depression, and ways to limit stress.   Stress II: Relaxation -Discuss different types of relaxation techniques to limit stress.   Warning Signs of Stroke / TIA -Discuss definition of a stroke, what the signs and symptoms are of a stroke, and how to  identify when someone is having stroke.   Knowledge Questionnaire Score:     Knowledge Questionnaire Score - 11/26/15 1457      Knowledge Questionnaire Score   Pre Score 19/24      Core Components/Risk Factors/Patient Goals at Admission:     Personal Goals and Risk Factors at Admission - 11/26/15 1511      Core Components/Risk Factors/Patient Goals on Admission    Weight Management Weight Maintenance   Increase Strength and Stamina Yes   Intervention Provide advice, education, support and counseling about physical activity/exercise needs.;Develop an individualized exercise prescription for aerobic and resistive training based on initial evaluation findings, risk stratification, comorbidities and participant's personal goals.   Expected Outcomes Achievement of increased cardiorespiratory fitness and enhanced flexibility, muscular endurance and strength shown  through measurements of functional capacity and personal statement of participant.   Diabetes Yes   Intervention Provide education about signs/symptoms and action to take for hypo/hyperglycemia.   Expected Outcomes Long Term: Attainment of HbA1C < 7%.   Personal Goal Other Yes   Personal Goal Improve SOB, Be able to do as much as I can.    Intervention Come to CR 3 days/week and supplement with 2 mor days at home.    Expected Outcomes To reach personal goals.       Core Components/Risk Factors/Patient Goals Review:      Goals and Risk Factor Review    Row Name 11/26/15 1712 12/04/15 1340           Core Components/Risk Factors/Patient Goals Review   Personal Goals Review Increase Strength and Stamina;Other  Be able to do more activities Increase Strength and Stamina;Improve shortness of breath with ADL's;Other  Be able to do as much as I can.       Review  - Patient has had 3 sessions and has done well.       Expected Outcomes To decrease SOB, and to increase acitivities Continue to work toward goal of improved SOB and being able to do more.          Core Components/Risk Factors/Patient Goals at Discharge (Final Review):      Goals and Risk Factor Review - 12/04/15 1340      Core Components/Risk Factors/Patient Goals Review   Personal Goals Review Increase Strength and Stamina;Improve shortness of breath with ADL's;Other  Be able to do as much as I can.    Review Patient has had 3 sessions and has done well.    Expected Outcomes Continue to work toward goal of improved SOB and being able to do more.       ITP Comments:   Comments: Patient doing well with program. Will continue to monitor for progress.

## 2015-12-04 NOTE — Telephone Encounter (Signed)
Spoke with pt.  Had episode of pinkish red blood tinged urine this morning.  No pain,burning fever.  INR was 2.3 1 wk ago.  Discussed possibility of UTI with pt.  Pt wishes to monitor situation and call back if urine doesn't clear up.  Offered INR check today but pt declined.  Has INR check scheduled for 12/09/15.

## 2015-12-04 NOTE — Telephone Encounter (Signed)
Christian Rasmussen called stating that he has blood in urine.  States that it started today. Patient is on coumdin. Please call patient on cell #

## 2015-12-05 ENCOUNTER — Encounter (HOSPITAL_COMMUNITY): Payer: Medicare Other

## 2015-12-05 ENCOUNTER — Encounter (HOSPITAL_COMMUNITY)
Admission: RE | Admit: 2015-12-05 | Discharge: 2015-12-05 | Disposition: A | Discharge status: Home / Self Care | Payer: Medicare Other | Source: Ambulatory Visit | Attending: Cardiovascular Disease | Admitting: Cardiovascular Disease

## 2015-12-05 DIAGNOSIS — Z9889 Other specified postprocedural states: Secondary | ICD-10-CM

## 2015-12-05 DIAGNOSIS — I214 Non-ST elevation (NSTEMI) myocardial infarction: Secondary | ICD-10-CM

## 2015-12-05 NOTE — Progress Notes (Signed)
Daily Session Note  Patient Details  Name: Christian Rasmussen MRN: [id number] Date of Birth: Apr 07, 1941 Referring Provider:   Flowsheet Row CARDIAC REHAB PHASE II ORIENTATION from 11/26/2015 in [place]  Referring Provider  Dr. Bronson Ing      Encounter Date: 12/05/2015  Check In:     Session Check In - 12/05/15 1545      Check-In   Location [facility]   Staff Present Jaelen Soth Angelina Pih, MS, EP, [facility], Exercise Physiologist;Debra Wynetta Emery, RN, BSN   Supervising physician immediately available to respond to emergencies See telemetry face sheet for immediately available MD   Medication changes reported     No   Fall or balance concerns reported    No   Warm-up and Cool-down Performed as group-led instruction   Resistance Training Performed Yes   VAD Patient? No     Pain Assessment   Pain Score 0-No pain   Multiple Pain Sites No      Capillary Blood Glucose: No results found for this or any previous visit (from the past 24 hour(s)).   Goals Met:  Independence with exercise equipment Exercise tolerated well No report of cardiac concerns or symptoms Strength training completed today  Goals Unmet:  Not Applicable  Comments: Check out 4:45   Dr. Kate Sable is Medical Director for [place].

## 2015-12-08 ENCOUNTER — Encounter (HOSPITAL_COMMUNITY): Payer: Medicare Other

## 2015-12-08 ENCOUNTER — Encounter (HOSPITAL_COMMUNITY)
Admission: RE | Admit: 2015-12-08 | Discharge: 2015-12-08 | Disposition: A | Discharge status: Home / Self Care | Payer: Medicare Other | Source: Ambulatory Visit | Attending: Cardiovascular Disease | Admitting: Cardiovascular Disease

## 2015-12-08 DIAGNOSIS — Z9889 Other specified postprocedural states: Secondary | ICD-10-CM | POA: Diagnosis not present

## 2015-12-08 DIAGNOSIS — I214 Non-ST elevation (NSTEMI) myocardial infarction: Secondary | ICD-10-CM | POA: Diagnosis not present

## 2015-12-08 NOTE — Progress Notes (Signed)
Daily Session Note  Patient Details  Name: Christian Rasmussen MRN: [id number] Date of Birth: [birth year] Referring Provider:   Flowsheet Row CARDIAC REHAB PHASE II ORIENTATION from 11/26/2015 in [place]  Referring Provider  Dr. Bronson Ing      Encounter Date: 12/08/2015  Check In:     Session Check In - 12/08/15 1545      Check-In   Location [facility]   Staff Present Juliahna Wiswell Angelina Pih, MS, EP, [facility], Exercise Physiologist;Debra Wynetta Emery, RN, BSN   Supervising physician immediately available to respond to emergencies See telemetry face sheet for immediately available MD   Medication changes reported     No   Fall or balance concerns reported    No   Warm-up and Cool-down Performed as group-led instruction   VAD Patient? No     Pain Assessment   Pain Score 0-No pain   Multiple Pain Sites No      Capillary Blood Glucose: No results found for this or any previous visit (from the past 24 hour(s)).   Goals Met:  Independence with exercise equipment Exercise tolerated well No report of cardiac concerns or symptoms Strength training completed today  Goals Unmet:  Not Applicable  Comments: Check out [phone]   Dr. Kate Sable is Medical Director for [place].

## 2015-12-09 ENCOUNTER — Ambulatory Visit (INDEPENDENT_AMBULATORY_CARE_PROVIDER_SITE_OTHER): Payer: Medicare Other | Admitting: *Deleted

## 2015-12-09 DIAGNOSIS — Z5181 Encounter for therapeutic drug level monitoring: Secondary | ICD-10-CM | POA: Diagnosis not present

## 2015-12-09 DIAGNOSIS — I4892 Unspecified atrial flutter: Secondary | ICD-10-CM

## 2015-12-09 DIAGNOSIS — Z9889 Other specified postprocedural states: Secondary | ICD-10-CM

## 2015-12-09 LAB — POCT INR: INR: 2.3

## 2015-12-10 ENCOUNTER — Encounter (HOSPITAL_COMMUNITY): Payer: Medicare Other

## 2015-12-10 ENCOUNTER — Encounter (HOSPITAL_COMMUNITY)
Admission: RE | Admit: 2015-12-10 | Discharge: 2015-12-10 | Disposition: A | Discharge status: Home / Self Care | Payer: Medicare Other | Source: Ambulatory Visit | Attending: Cardiovascular Disease | Admitting: Cardiovascular Disease

## 2015-12-10 DIAGNOSIS — Z9889 Other specified postprocedural states: Secondary | ICD-10-CM

## 2015-12-10 DIAGNOSIS — I214 Non-ST elevation (NSTEMI) myocardial infarction: Secondary | ICD-10-CM

## 2015-12-10 NOTE — Progress Notes (Signed)
Daily Session Note  Patient Details  Name: KYANDRE OKRAY MRN: [id number] Date of Birth: Apr 19, 1941 Referring Provider:   Flowsheet Row CARDIAC REHAB PHASE II ORIENTATION from 11/26/2015 in [place]  Referring Provider  Dr. Bronson Ing      Encounter Date: 12/10/2015  Check In:     Session Check In - 12/10/15 1545      Check-In   Location [facility]   Staff Present Makylee Sanborn Angelina Pih, MS, EP, [facility], Exercise Physiologist;Debra Wynetta Emery, RN, BSN   Supervising physician immediately available to respond to emergencies See telemetry face sheet for immediately available MD   Medication changes reported     No   Fall or balance concerns reported    No   Warm-up and Cool-down Performed as group-led instruction   Resistance Training Performed Yes   VAD Patient? No     Pain Assessment   Pain Score 0-No pain   Multiple Pain Sites No      Capillary Blood Glucose: No results found for this or any previous visit (from the past 24 hour(s)).   Goals Met:  Independence with exercise equipment Exercise tolerated well No report of cardiac concerns or symptoms Strength training completed today  Goals Unmet:  Not Applicable  Comments: Check out: 4:45   Dr. Kate Sable is Medical Director for [place] and Pulmonary Rehab.

## 2015-12-11 DIAGNOSIS — I471 Supraventricular tachycardia: Secondary | ICD-10-CM | POA: Diagnosis not present

## 2015-12-11 DIAGNOSIS — E7800 Pure hypercholesterolemia, unspecified: Secondary | ICD-10-CM | POA: Diagnosis not present

## 2015-12-11 DIAGNOSIS — E1151 Type 2 diabetes mellitus with diabetic peripheral angiopathy without gangrene: Secondary | ICD-10-CM | POA: Diagnosis not present

## 2015-12-11 DIAGNOSIS — I4892 Unspecified atrial flutter: Secondary | ICD-10-CM | POA: Diagnosis not present

## 2015-12-12 ENCOUNTER — Encounter (HOSPITAL_COMMUNITY)
Admission: RE | Admit: 2015-12-12 | Discharge: 2015-12-12 | Disposition: A | Discharge status: Home / Self Care | Payer: Medicare Other | Source: Ambulatory Visit | Attending: Cardiovascular Disease | Admitting: Cardiovascular Disease

## 2015-12-12 ENCOUNTER — Encounter (HOSPITAL_COMMUNITY): Payer: Medicare Other

## 2015-12-12 ENCOUNTER — Other Ambulatory Visit: Payer: Self-pay | Admitting: Surgical

## 2015-12-12 DIAGNOSIS — Z9889 Other specified postprocedural states: Secondary | ICD-10-CM | POA: Diagnosis not present

## 2015-12-12 DIAGNOSIS — I214 Non-ST elevation (NSTEMI) myocardial infarction: Secondary | ICD-10-CM | POA: Diagnosis not present

## 2015-12-12 NOTE — Progress Notes (Signed)
Daily Session Note  Patient Details  Name: Christian Rasmussen MRN: [id number] Date of Birth: 26-Apr-1941 Referring Provider:   Flowsheet Row CARDIAC REHAB PHASE II ORIENTATION from 11/26/2015 in [place]  Referring Provider  Dr. Bronson Ing      Encounter Date: 12/12/2015  Check In:     Session Check In - 12/12/15 1545      Check-In   Location [facility]   Staff Present Dalonte Hardage Angelina Pih, MS, EP, [facility], Exercise Physiologist;Debra Wynetta Emery, RN, BSN   Supervising physician immediately available to respond to emergencies See telemetry face sheet for immediately available MD   Medication changes reported     No   Fall or balance concerns reported    No   Warm-up and Cool-down Performed as group-led instruction   Resistance Training Performed Yes   VAD Patient? No     Pain Assessment   Pain Score 0-No pain   Multiple Pain Sites No      Capillary Blood Glucose: No results found for this or any previous visit (from the past 24 hour(s)).   Goals Met:  Independence with exercise equipment Exercise tolerated well No report of cardiac concerns or symptoms Strength training completed today  Goals Unmet:  Not Applicable  Comments: Check out 4:45   Dr. Kate Sable is Medical Director for [place] and Pulmonary Rehab.

## 2015-12-15 ENCOUNTER — Encounter (HOSPITAL_COMMUNITY): Payer: Medicare Other

## 2015-12-17 ENCOUNTER — Encounter (HOSPITAL_COMMUNITY): Payer: Medicare Other

## 2015-12-17 ENCOUNTER — Encounter (HOSPITAL_COMMUNITY)
Admission: RE | Admit: 2015-12-17 | Discharge: 2015-12-17 | Disposition: A | Discharge status: Home / Self Care | Payer: Medicare Other | Source: Ambulatory Visit | Attending: Cardiovascular Disease | Admitting: Cardiovascular Disease

## 2015-12-17 DIAGNOSIS — Z9889 Other specified postprocedural states: Secondary | ICD-10-CM | POA: Diagnosis not present

## 2015-12-17 DIAGNOSIS — I214 Non-ST elevation (NSTEMI) myocardial infarction: Secondary | ICD-10-CM | POA: Diagnosis not present

## 2015-12-17 NOTE — Progress Notes (Signed)
Daily Session Note  Patient Details  Name: Christian Rasmussen MRN: [id number] Date of Birth: 07-Jan-1941 Referring Provider:   Flowsheet Row CARDIAC REHAB PHASE II ORIENTATION from 11/26/2015 in [place]  Referring Provider  Dr. Bronson Ing      Encounter Date: 12/17/2015  Check In:     Session Check In - 12/17/15 1545      Check-In   Location [facility]   Staff Present Diane Angelina Pih, MS, EP, [facility], Exercise Physiologist;Alijah Hyde Wynetta Emery, RN, BSN   Supervising physician immediately available to respond to emergencies See telemetry face sheet for immediately available MD   Medication changes reported     No   Fall or balance concerns reported    No   Warm-up and Cool-down Performed as group-led instruction   Resistance Training Performed Yes   VAD Patient? No     Pain Assessment   Currently in Pain? No/denies   Pain Score 0-No pain   Multiple Pain Sites No      Capillary Blood Glucose: No results found for this or any previous visit (from the past 24 hour(s)).   Goals Met:  Independence with exercise equipment Exercise tolerated well No report of cardiac concerns or symptoms Strength training completed today  Goals Unmet:  Not Applicable  Comments: Check out 445.   Dr. Kate Sable is Medical Director for [facility] Cardiac and Pulmonary Rehab.

## 2015-12-18 ENCOUNTER — Other Ambulatory Visit: Payer: Self-pay | Admitting: Cardiovascular Disease

## 2015-12-18 MED ORDER — AMLODIPINE BESYLATE 10 MG PO TABS: 10.0000 mg | ORAL_TABLET | Freq: Every day | ORAL | 6 refills | Status: DC

## 2015-12-18 MED ORDER — FE FUMARATE-B12-VIT C-FA-IFC PO CAPS: 1.0000 | ORAL_CAPSULE | Freq: Two times a day (BID) | ORAL | 6 refills | Status: DC

## 2015-12-18 NOTE — Telephone Encounter (Signed)
Has two medications that he needs refilled that is not on his medication and the one listed below  amLODipine (NORVASC) 10 MG tablet   [facility] in [place]

## 2015-12-19 ENCOUNTER — Encounter (HOSPITAL_COMMUNITY): Payer: Medicare Other

## 2015-12-19 ENCOUNTER — Encounter (HOSPITAL_COMMUNITY)
Admission: RE | Admit: 2015-12-19 | Discharge: 2015-12-19 | Disposition: A | Discharge status: Home / Self Care | Payer: Medicare Other | Source: Ambulatory Visit | Attending: Cardiovascular Disease | Admitting: Cardiovascular Disease

## 2015-12-19 DIAGNOSIS — Z9889 Other specified postprocedural states: Secondary | ICD-10-CM | POA: Diagnosis not present

## 2015-12-19 DIAGNOSIS — I214 Non-ST elevation (NSTEMI) myocardial infarction: Secondary | ICD-10-CM | POA: Diagnosis not present

## 2015-12-19 NOTE — Progress Notes (Signed)
Daily Session Note  Patient Details  Name: Christian Rasmussen MRN: [id number] Date of Birth: 08/21/1940 Referring Provider:   Flowsheet Row CARDIAC REHAB PHASE II ORIENTATION from 11/26/2015 in [place]  Referring Provider  Dr. Bronson Ing      Encounter Date: 12/19/2015  Check In:     Session Check In - 12/19/15 1545      Check-In   Location [facility]   Staff Present Diane Angelina Pih, MS, EP, [facility], Exercise Physiologist;Gram Siedlecki Wynetta Emery, RN, BSN   Supervising physician immediately available to respond to emergencies See telemetry face sheet for immediately available MD   Medication changes reported     No   Fall or balance concerns reported    No   Warm-up and Cool-down Performed as group-led instruction   Resistance Training Performed Yes   VAD Patient? No     Pain Assessment   Currently in Pain? No/denies   Pain Score 0-No pain   Multiple Pain Sites No      Capillary Blood Glucose: No results found for this or any previous visit (from the past 24 hour(s)).   Goals Met:  Independence with exercise equipment Exercise tolerated well No report of cardiac concerns or symptoms Strength training completed today  Goals Unmet:  Not Applicable  Comments: Check out 1615.   Dr. Kate Sable is Medical Director for [facility] Cardiac and Pulmonary Rehab.

## 2015-12-22 ENCOUNTER — Encounter (HOSPITAL_COMMUNITY)
Admission: RE | Admit: 2015-12-22 | Discharge: 2015-12-22 | Disposition: A | Discharge status: Home / Self Care | Payer: Medicare Other | Source: Ambulatory Visit | Attending: Cardiovascular Disease | Admitting: Cardiovascular Disease

## 2015-12-22 ENCOUNTER — Encounter (HOSPITAL_COMMUNITY): Payer: Medicare Other

## 2015-12-22 DIAGNOSIS — Z9889 Other specified postprocedural states: Secondary | ICD-10-CM | POA: Diagnosis not present

## 2015-12-22 DIAGNOSIS — I214 Non-ST elevation (NSTEMI) myocardial infarction: Secondary | ICD-10-CM | POA: Diagnosis not present

## 2015-12-22 NOTE — Progress Notes (Signed)
Daily Session Note  Patient Details  Name: Christian Rasmussen MRN: [id number] Date of Birth: 1941/01/21 Referring Provider:   Flowsheet Row CARDIAC REHAB PHASE II ORIENTATION from 11/26/2015 in [place]  Referring Provider  Dr. Bronson Ing      Encounter Date: 12/22/2015  Check In:     Session Check In - 12/22/15 1545      Check-In   Location [facility]   Staff Present Diane Angelina Pih, MS, EP, [facility], Exercise Physiologist;Tangala Wiegert Wynetta Emery, RN, BSN   Supervising physician immediately available to respond to emergencies See telemetry face sheet for immediately available MD   Medication changes reported     No   Fall or balance concerns reported    No   Warm-up and Cool-down Performed as group-led instruction   Resistance Training Performed Yes   VAD Patient? No     Pain Assessment   Currently in Pain? No/denies   Pain Score 0-No pain   Multiple Pain Sites No      Capillary Blood Glucose: No results found for this or any previous visit (from the past 24 hour(s)).   Goals Met:  Independence with exercise equipment Exercise tolerated well No report of cardiac concerns or symptoms Strength training completed today  Goals Unmet:  Not Applicable  Comments: Check out 1645.   Dr. Kate Sable is Medical Director for [facility] Cardiac and Pulmonary Rehab.

## 2015-12-23 ENCOUNTER — Ambulatory Visit: Payer: Medicare Other | Admitting: Cardiovascular Disease

## 2015-12-23 ENCOUNTER — Ambulatory Visit (INDEPENDENT_AMBULATORY_CARE_PROVIDER_SITE_OTHER): Payer: Medicare Other | Admitting: *Deleted

## 2015-12-23 DIAGNOSIS — Z5181 Encounter for therapeutic drug level monitoring: Secondary | ICD-10-CM | POA: Diagnosis not present

## 2015-12-23 DIAGNOSIS — Z9889 Other specified postprocedural states: Secondary | ICD-10-CM | POA: Diagnosis not present

## 2015-12-23 DIAGNOSIS — I4892 Unspecified atrial flutter: Secondary | ICD-10-CM | POA: Diagnosis not present

## 2015-12-23 LAB — POCT INR: INR: 3.3

## 2015-12-24 ENCOUNTER — Encounter (HOSPITAL_COMMUNITY)
Admission: RE | Admit: 2015-12-24 | Discharge: 2015-12-24 | Disposition: A | Discharge status: Home / Self Care | Payer: Medicare Other | Source: Ambulatory Visit | Attending: Cardiovascular Disease | Admitting: Cardiovascular Disease

## 2015-12-24 ENCOUNTER — Encounter: Payer: Self-pay | Admitting: Physician Assistant

## 2015-12-24 ENCOUNTER — Ambulatory Visit (INDEPENDENT_AMBULATORY_CARE_PROVIDER_SITE_OTHER): Payer: Medicare Other | Admitting: Physician Assistant

## 2015-12-24 ENCOUNTER — Encounter (HOSPITAL_COMMUNITY): Payer: Medicare Other

## 2015-12-24 VITALS — BP 104/62 | HR 98 | Ht 71.5 in | Wt 231.0 lb

## 2015-12-24 DIAGNOSIS — I10 Essential (primary) hypertension: Secondary | ICD-10-CM

## 2015-12-24 DIAGNOSIS — I4892 Unspecified atrial flutter: Secondary | ICD-10-CM | POA: Diagnosis not present

## 2015-12-24 DIAGNOSIS — I214 Non-ST elevation (NSTEMI) myocardial infarction: Secondary | ICD-10-CM

## 2015-12-24 DIAGNOSIS — Z9889 Other specified postprocedural states: Secondary | ICD-10-CM

## 2015-12-24 DIAGNOSIS — I2510 Atherosclerotic heart disease of native coronary artery without angina pectoris: Secondary | ICD-10-CM

## 2015-12-24 DIAGNOSIS — R609 Edema, unspecified: Secondary | ICD-10-CM | POA: Diagnosis not present

## 2015-12-24 DIAGNOSIS — R600 Localized edema: Secondary | ICD-10-CM | POA: Insufficient documentation

## 2015-12-24 NOTE — Progress Notes (Signed)
Daily Session Note  Patient Details  Name: Christian Rasmussen MRN: [id number] Date of Birth: 1940-10-28 Referring Provider:   Flowsheet Row CARDIAC REHAB PHASE II ORIENTATION from 11/26/2015 in [place]  Referring Provider  Dr. Bronson Ing      Encounter Date: 12/24/2015  Check In:     Session Check In - 12/24/15 1545      Check-In   Location [facility]   Staff Present Diane Angelina Pih, MS, EP, [facility], Exercise Physiologist;Vishaal Strollo Wynetta Emery, RN, BSN   Supervising physician immediately available to respond to emergencies See telemetry face sheet for immediately available MD   Medication changes reported     No   Fall or balance concerns reported    No   Warm-up and Cool-down Performed as group-led instruction   Resistance Training Performed Yes   VAD Patient? No     Pain Assessment   Currently in Pain? No/denies   Pain Score 0-No pain   Multiple Pain Sites No      Capillary Blood Glucose: No results found for this or any previous visit (from the past 24 hour(s)).   Goals Met:  Independence with exercise equipment Exercise tolerated well No report of cardiac concerns or symptoms Strength training completed today  Goals Unmet:  Not Applicable  Comments: Check out 1645.   Dr. Kate Sable is Medical Director for [facility] Cardiac and Pulmonary Rehab.

## 2015-12-24 NOTE — Progress Notes (Signed)
Cardiology Office Note    Date:  12/24/2015   ID:  [name], DOB [birth year], MRN [id number]  PCP:  Monico Blitz, MD  Cardiologist: Dr. Bronson Ing  No chief complaint on file.   History of Present Illness:  Christian Rasmussen is a 75 y.o. 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 He is back on warfarin for recurrence of atrial fib noted by his pacemaker.  He saw Dr. Roxy Manns on 11/24/15 who recommended staying on Coumadin for at least 6 weeks.  Patient comes in today for follow-up. He complains of mild right ankle swelling. He has been eating out a lot and getting extra salt in his diet. He denies chest pain, palpitations, dyspnea, dyspnea on exertion, dizziness or presyncope. He notes that his heart rate is in the 90s where it's usually in the 60s but he is asymptomatic with this. He is willing to go back to work and quit cardiac rehabilitation.    Past Medical History:  Diagnosis Date  . 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 Aortic stenosis 10/14/2015  . CAD (coronary artery disease)    status post Taxus stent patency of RCA 2004, Cardiolite negative for ischemia in 2010.  . Diabetes mellitus ([place])   . Dyslipidemia   . History of kidney stones   . Hypertension   . Mitral regurgitation 10/15/2015   Moderate-severe by intra-operative TEE  . Overweight   . Right bundle branch block (RBBB) with left anterior hemiblock   . 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 Snores   . Typical atrial flutter [facility])    s/p ablation 11-23-2012 by Dr Rayann Heman    Past Surgical History:  Procedure  Laterality Date  . ABLATION  11-23-2012   s/p cavotricuspid isthmus ablation by Dr Rayann Heman  . ANKLE SURGERY Left    total ankle replacement  . ATRIAL FLUTTER ABLATION N/A 11/23/2012   Procedure: ATRIAL FLUTTER ABLATION;  Surgeon: Thompson Grayer, MD;  Location: [facility] CATH LAB;  Service: Cardiovascular;  Laterality: N/A;  . CARDIAC CATHETERIZATION N/A 10/13/2015   Procedure: Left Heart Cath and Coronary Angiography;  Surgeon: Peter M Martinique, MD;  Location: [place] CV LAB;  Service: Cardiovascular;  Laterality: N/A;  . CATARACT EXTRACTION Right   . CATARACT EXTRACTION W/PHACO Left 09/13/2013   Procedure: CATARACT EXTRACTION PHACO AND INTRAOCULAR LENS PLACEMENT (IOC);  Surgeon: Tonny Branch, MD;  Location: [facility] ORS;  Service: Ophthalmology;  Laterality: Left;  CDE 9.38  . COLONOSCOPY N/A 10/10/2014   Procedure: COLONOSCOPY;  Surgeon: Rogene Houston, MD;  Location: [facility] ENDO SUITE;  Service: Endoscopy;  Laterality: N/A;  830  . CORONARY ANGIOPLASTY     3 stents  . CORONARY ARTERY BYPASS GRAFT N/A 10/15/2015   Procedure: CORONARY ARTERY BYPASS GRAFTING (CABG) X4 LIMA-LAD; SEQ SVG-PD-PL; SVG-OM1 ENDOSCOPIC GREATER SAPHENOUS VEIN HARVEST(EVH) BILAT LOWER EXTREM;  Surgeon: Rexene Alberts, MD;  Location: [place];  Service: Open Heart Surgery;  Laterality: N/A;  . EP IMPLANTABLE DEVICE N/A 10/20/2015   Procedure: Pacemaker Implant;  Surgeon: Evans Lance, MD;  Location: [place] CV LAB;  Service: Cardiovascular;  Laterality: N/A;  . MITRAL VALVE REPAIR N/A 10/15/2015   Procedure: MITRAL VALVE REPAIR (MVR), # 28 MEMO 3-D RING ANNULOPLASTY AND COMPLEX VALVE REPAIR;  Surgeon: Rexene Alberts, MD;  Location: [place];  Service: Open Heart Surgery;  Laterality: N/A;  . TEE WITHOUT CARDIOVERSION N/A 10/15/2015   Procedure: TRANSESOPHAGEAL ECHOCARDIOGRAM (TEE);  Surgeon: Rexene Alberts, MD;  Location: [place];  Service: Open Heart Surgery;  Laterality: N/A;    Current Medications: Outpatient Medications Prior to Visit    Medication Sig Dispense Refill  . amLODipine (NORVASC) 10 MG tablet Take 1 tablet (10 mg total) by mouth daily. 30 tablet 6  . aspirin EC 81 MG tablet Take 1 tablet (81 mg total) by mouth daily. 90 tablet 3  . ASTAXANTHIN PO Take 10 mg by mouth daily. Reported on 10/30/2015    . atorvastatin (LIPITOR) 40 MG tablet Take 1 tablet by mouth daily. Reported on 11/24/2015    . carvedilol (COREG) 6.25 MG tablet Take 2 tablets (12.5 mg total) by mouth 2 (two) times daily. 360 tablet 3  . ferrous [id number] C-folic acid (TRINSICON / FOLTRIN) capsule Take 1 capsule by mouth 2 (two) times daily with a meal. 60 capsule 6  . furosemide (LASIX) 40 MG tablet Take 1 tablet (40 mg total) by mouth daily. 30 tablet 1  . lisinopril (PRINIVIL,ZESTRIL) 20 MG tablet Take 1 tablet (20 mg total) by mouth daily. 90 tablet 3  . metFORMIN (GLUCOPHAGE) 500 MG tablet Take 1,000 mg by mouth 2 (two) times daily with a meal. Reported on 10/30/2015  2  . potassium chloride (K-DUR) 10 MEQ tablet Take 10 mEq by mouth daily.    Marland Kitchen warfarin (COUMADIN) 5 MG tablet Take 2 tablets daily or as directed 60 tablet 3  . Berberine Chloride POWD Take 1,200 mg by mouth daily. Reported on 10/30/2015    . Coenzyme Q10 (CO Q10) 100 MG CAPS Take 1 tablet by mouth daily. Reported on 10/30/2015    . fish oil-omega-3 fatty acids 1000 MG capsule Take 2 g by mouth daily. Reported on 10/30/2015    . Multiple Vitamin (MULITIVITAMIN WITH MINERALS) TABS Take 1 tablet by mouth daily. Reported on 10/30/2015     No facility-administered medications prior to visit.      Allergies:   Review of patient's allergies indicates no known allergies.   Social History   Social History  . Marital status: Married    Spouse name: N/A  . Number of children: N/A  . Years of education: N/A   Occupational History  . 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 Smoking status: Former Smoker    Packs/day: 1.00    Years: 28.00     Types: Cigarettes    Start date: 10/21/1956    Quit date: 05/10/1985  . Smokeless tobacco: Never Used  . Alcohol use 0.0 oz/week     Comment: occasional wine  . Drug use: No  . Sexual activity: Yes    Birth control/ protection: None   Other Topics Concern  . None   Social History Narrative   Lives in [place] with spouse.   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   ROS:   Please see the history of present illness.  Review of Systems  Constitution: Negative.  HENT: Negative.   Cardiovascular: Negative.   Respiratory: Negative.   Endocrine: Negative.   Hematologic/Lymphatic: Negative.   Musculoskeletal: Negative.   Gastrointestinal: Negative.   Genitourinary: Negative.   Neurological: Negative.    All other systems reviewed and are negative.   PHYSICAL EXAM:   VS:  BP 104/62   Pulse 98   Ht 5' 11.5" (1.816 m)   Wt 231 lb (104.8 kg)   SpO2 92%   BMI 31.77 kg/m   Physical Exam  GEN: Obese, in no acute distress  Neck: no JVD, carotid bruits, or masses Cardiac:RRR; 1/6 systolic murmur at the left sternal border, no rubs, or gallops  Respiratory:  clear to auscultation bilaterally, normal work of breathing GI: soft, nontender, nondistended, + BS Ext: Mild right ankle edema, venectomy site healing well, otherwise lower extremities without cyanosis, clubbing, or edema, Good distal pulses bilaterally MS: no deformity or atrophy  Skin: warm and dry, no rash Psych: euthymic mood, full affect  Wt Readings from Last 3 Encounters:  12/24/15 231 lb (104.8 kg)  11/26/15 235 lb 0.2 oz (106.6 kg)  11/24/15 240 lb (108.9 kg)      Studies/Labs Reviewed:   EKG:  EKG is  ordered today.  The ekg ordered today demonstrates Normal sinus rhythm with some paced rhythm at 91 bpm  Recent Labs: 10/14/2015: ALT 26 10/16/2015: Magnesium  2.4 10/23/2015: Hemoglobin 8.1; Platelets 308 10/24/2015: BUN 23; Creatinine, Ser 0.84; Potassium 4.8; Sodium 135   Lipid Panel    Component Value Date/Time   CHOL 108 10/14/2015 0431   TRIG 157 (H) 10/14/2015 0431   HDL 34 (L) 10/14/2015 0431   CHOLHDL 3.2 10/14/2015 0431   VLDL 31 10/14/2015 0431   LDLCALC 43 10/14/2015 0431    Additional studies/ records that were reviewed today include:   2-D echo 6/2017Study Conclusions   - Left ventricle: The cavity size was normal. Wall thickness was   increased in a pattern of mild LVH. Systolic function was normal.   The estimated ejection fraction was in the range of 50% to 55%.   Features are consistent with a pseudonormal left ventricular   filling pattern, with concomitant abnormal relaxation and   increased filling pressure (grade 2 diastolic dysfunction). - Aortic valve: Moderately to severely calcified annulus. There was   mild stenosis. There was mild regurgitation. Valve area (VTI):   1.3 cm^2. Valve area (Vmax): 1.09 cm^2. Valve area (Vmean): 1.29   cm^2. - Mitral valve: Mildly calcified annulus. There was mild   regurgitation. - Left atrium: The atrium was mildly dilated.      ASSESSMENT:    1. Atrial flutter, unspecified type ([place])   2. S/P mitral valve repair + CABG x4   3. Essential hypertension   4. Edema extremities      PLAN:  In order of problems listed above:  Atrial flutter/fibrillation noted on pacemaker check after surgery on Coumadin for 6 weeks in normal sinus rhythm today recommend follow-up with Dr. Lovena Le as scheduled September 15  Status post mitral valve repair and CABG 46/2017 doing well continue cardiac rehabilitation  Essential hypertension blood pressure on the low side  Mild ankle edema suspected secondary to increased sodium intake. Increase Lasix to 60 mg once tomorrow then back to 40 mg daily increase potassium to 20 mEq tomorrow then back to 10 mEq daily. 2 g sodium  diet.    Medication Adjustments/Labs and Tests Ordered: Current medicines are reviewed  at length with the patient today.  Concerns regarding medicines are outlined above.  Medication changes, Labs and Tests ordered today are listed in the Patient Instructions below. Patient Instructions  Your physician recommends that you schedule a follow-up appointment in: Dr. Bronson Ing in 3 Months.   Your physician recommends that you schedule a follow-up appointment in: September with Dr. Lovena Le.   Your physician has recommended you make the following change in your medication:  You may take an extra Lasix 40 mg and Potassium 10 mEq Tomorrow.   If you need a refill on your cardiac medications before your next appointment, please call your pharmacy.  Thank you for choosing [facility]!         Signed, Ermalinda Barrios, PA-C  12/24/2015 2:12 PM    [facility] [place], [place], [place]  [address] Phone: [phone]; Fax: [phone]

## 2015-12-24 NOTE — Patient Instructions (Addendum)
Your physician recommends that you schedule a follow-up appointment in: Dr. Bronson Ing in 3 Months.   Your physician recommends that you schedule a follow-up appointment in: September with Dr. Lovena Le.   Your physician has recommended you make the following change in your medication:  You may take an extra Lasix 40 mg and Potassium 10 mEq Tomorrow.   If you need a refill on your cardiac medications before your next appointment, please call your pharmacy.  Thank you for choosing [facility]!

## 2015-12-25 ENCOUNTER — Telehealth: Payer: Self-pay | Admitting: Internal Medicine

## 2015-12-25 ENCOUNTER — Ambulatory Visit: Payer: Medicare Other | Admitting: Cardiovascular Disease

## 2015-12-25 NOTE — Progress Notes (Signed)
Cardiac Individual Treatment Plan  Patient Details  Name: FILOMENO FRILOT MRN: [id number] Date of Birth: 12-22-1940 Referring Provider:   Flowsheet Row CARDIAC REHAB PHASE II ORIENTATION from 11/26/2015 in [place]  Referring Provider  Dr. Bronson Ing      Initial Encounter Date:  Flowsheet Row CARDIAC REHAB PHASE II ORIENTATION from 11/26/2015 in [place]  Date  11/26/15  Referring Provider  Dr. Bronson Ing      Visit Diagnosis: NSTEMI (non-ST elevated myocardial infarction) [facility])  Patient's Home Medications on Admission:  Current Outpatient Prescriptions:  .  amLODipine (NORVASC) 10 MG tablet, Take 1 tablet (10 mg total) by mouth daily., Disp: 30 tablet, Rfl: 6 .  aspirin EC 81 MG tablet, Take 1 tablet (81 mg total) by mouth daily., Disp: 90 tablet, Rfl: 3 .  ASTAXANTHIN PO, Take 10 mg by mouth daily. Reported on 10/30/2015, Disp: , Rfl:  .  atorvastatin (LIPITOR) 40 MG tablet, Take 1 tablet by mouth daily. Reported on 11/24/2015, Disp: , Rfl:  .  carvedilol (COREG) 6.25 MG tablet, Take 2 tablets (12.5 mg total) by mouth 2 (two) times daily., Disp: 360 tablet, Rfl: 3 .  ferrous [id number] C-folic acid (TRINSICON / FOLTRIN) capsule, Take 1 capsule by mouth 2 (two) times daily with a meal., Disp: 60 capsule, Rfl: 6 .  furosemide (LASIX) 40 MG tablet, Take 1 tablet (40 mg total) by mouth daily., Disp: 30 tablet, Rfl: 1 .  lisinopril (PRINIVIL,ZESTRIL) 20 MG tablet, Take 1 tablet (20 mg total) by mouth daily., Disp: 90 tablet, Rfl: 3 .  metFORMIN (GLUCOPHAGE) 500 MG tablet, Take 1,000 mg by mouth 2 (two) times daily with a meal. Reported on 10/30/2015, Disp: , Rfl: 2 .  potassium chloride (K-DUR) 10 MEQ tablet, Take 10 mEq by mouth daily., Disp: , Rfl:  .  warfarin (COUMADIN) 5 MG tablet, Take 2 tablets daily or as directed, Disp: 60 tablet, Rfl: 3  Past Medical History: Past Medical History:  Diagnosis Date  . Aortic insufficiency  10/15/2015   mild (1+/2+) by TEE  . Aortic stenosis 10/14/2015  . CAD (coronary artery disease)    status post Taxus stent patency of RCA 2004, Cardiolite negative for ischemia in 2010.  . Diabetes mellitus ([place])   . Dyslipidemia   . History of kidney stones   . Hypertension   . Mitral regurgitation 10/15/2015   Moderate-severe by intra-operative TEE  . Overweight   . Right bundle branch block (RBBB) with left anterior hemiblock   . 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 Snores   . Typical atrial flutter [facility])    s/p ablation 11-23-2012 by Dr Rayann Heman    Tobacco Use: History  Smoking Status  . Former Smoker  . Packs/day: 1.00  . Years: 28.00  . Types: Cigarettes  . Start date: 10/21/1956  . Quit date: 05/10/1985  Smokeless Tobacco  . Never Used    Labs: Recent Review Flowsheet Data    Labs for ITP Cardiac and Pulmonary Rehab Latest Ref Rng & Units 10/16/2015 10/16/2015 10/16/2015 10/16/2015 10/18/2015   Cholestrol 0 - 200 mg/dL - - - - -   LDLCALC 0 - 99 mg/dL - - - - -  HDL >40 mg/dL - - - - -   Trlycerides <150 mg/dL - - - - -   Hemoglobin A1c 4.8 - 5.6 % - - - - -   PHART 7.350 - 7.450 - 7.394 7.415 - -   PCO2ART 35.0 - 45.0 mmHg - 35.5 36.2 - -   HCO3 20.0 - 24.0 mEq/L - 21.6 23.2 - -   TCO2 0 - 100 mmol/L 22 23 24 20 26    ACIDBASEDEF 0.0 - 2.0 mmol/L - 3.0(H) 1.0 - -   O2SAT % - 96.0 97.0 - -      Capillary Blood Glucose: Lab Results  Component Value Date   GLUCAP 109 (H) 10/24/2015   GLUCAP 130 (H) 10/24/2015   GLUCAP 125 (H) 10/23/2015   GLUCAP 154 (H) 10/23/2015   GLUCAP 138 (H) 10/23/2015     Exercise Target Goals:    Exercise Program  Goal: Individual exercise prescription set with THRR, safety & activity barriers. Participant demonstrates ability to understand and report RPE using BORG scale, to self-measure pulse accurately, and to acknowledge the importance of the exercise prescription.  Exercise Prescription Goal: Starting with aerobic activity 30 plus minutes a day, 3 days per week for initial exercise prescription. Provide home exercise prescription and guidelines that participant acknowledges understanding prior to discharge.  Activity Barriers & Risk Stratification:     Activity Barriers & Cardiac Risk Stratification - 11/26/15 1456      Activity Barriers & Cardiac Risk Stratification   Activity Barriers None   Cardiac Risk Stratification High      6 Minute Walk:     6 Minute Walk    Row Name 11/26/15 1453         6 Minute Walk   Phase Initial     Distance 1150 feet     Walk Time 6 minutes     # of Rest Breaks 0     MPH 2.17     METS 2.66     RPE 15     Perceived Dyspnea  14     VO2 Peak 9.8     Symptoms No     Resting HR 86 bpm     Resting BP 140/68     Max Ex. HR 118 bpm     Max Ex. BP 180/84     2 Minute Post BP 144/70        Initial Exercise Prescription:     Initial Exercise Prescription - 11/26/15 1400      Date of Initial Exercise RX and Referring Provider   Date 11/26/15   Referring Provider Dr. Bronson Ing     Treadmill   MPH 1.3   Grade 0   Minutes 15   METs 1.9     NuStep   Level 2   Watts 15   Minutes 20   METs 1.9     Prescription Details   Frequency (times per week) 3   Duration Progress to 30 minutes of continuous aerobic without signs/symptoms of physical distress     Intensity   THRR REST +  30   THRR 40-80% of Max Heartrate [id number]   Ratings of Perceived Exertion 11-13   Perceived Dyspnea 0-4     Progression   Progression Continue to progress workloads to maintain intensity without signs/symptoms of physical distress.     Resistance Training    Training Prescription Yes   Weight 1   Reps 10-12      Perform Capillary Blood  Glucose checks as needed.  Exercise Prescription Changes:      Exercise Prescription Changes    Row Name 12/02/15 1200 12/16/15 1300           Exercise Review   Progression Yes Yes        Response to Exercise   Blood Pressure (Admit) 122/62 118/60      Blood Pressure (Exercise) 118/60 128/70      Blood Pressure (Exit) 110/54 112/66      Heart Rate (Admit) 90 bpm 88 bpm      Heart Rate (Exercise) 111 bpm 114 bpm      Heart Rate (Exit) 96 bpm 101 bpm      Rating of Perceived Exertion (Exercise) 13 11      Duration Progress to 30 minutes of continuous aerobic without signs/symptoms of physical distress Progress to 30 minutes of continuous aerobic without signs/symptoms of physical distress      Intensity Rest + 30 Rest + 30        Progression   Progression Continue progressive overload as per policy without signs/symptoms or physical distress. Continue progressive overload as per policy without signs/symptoms or physical distress.        Resistance Training   Training Prescription Yes Yes      Weight 1 3      Reps 10-12 10-12        Treadmill   MPH 0  -      Grade 0  -      Minutes 0  -      METs 0  -        NuStep   Level 2 0      Watts 25 0      Minutes 15 0      METs 1.9 0        Arm Ergometer   Level  - 2.5      Watts  - 24      Minutes  - 15      METs  - 2.9        Recumbant Elliptical   Level 1 2      RPM 37 46      Watts 46 59      Minutes 20 20      METs 3.69 3.1        Home Exercise Plan   Plans to continue exercise at [place] 2 additional days to program exercise sessions. Add 2 additional days to program exercise sessions.         Exercise Comments:      Exercise Comments    Row Name 12/03/15 0856 12/16/15 1302         Exercise Comments [place] is proggressing appropriately  Patient is progressing appropriately           Discharge Exercise Prescription (Final Exercise Prescription Changes):     Exercise Prescription Changes - 12/16/15 1300      Exercise Review   Progression Yes     Response to Exercise   Blood Pressure (Admit) 118/60   Blood Pressure (Exercise) 128/70   Blood Pressure (Exit) 112/66   Heart Rate (Admit) 88 bpm   Heart Rate (Exercise) 114 bpm   Heart Rate (Exit) 101 bpm   Rating of Perceived Exertion (Exercise) 11   Duration Progress to 30 minutes of continuous aerobic without signs/symptoms of physical distress   Intensity Rest + 30  Progression   Progression Continue progressive overload as per policy without signs/symptoms or physical distress.     Resistance Training   Training Prescription Yes   Weight 3   Reps 10-12     NuStep   Level 0   Watts 0   Minutes 0   METs 0     Arm Ergometer   Level 2.5   Watts 24   Minutes 15   METs 2.9     Recumbant Elliptical   Level 2   RPM 46   Watts 59   Minutes 20   METs 3.1     Home Exercise Plan   Plans to continue exercise at Home   Frequency Add 2 additional days to program exercise sessions.      Nutrition:  Target Goals: Understanding of nutrition guidelines, daily intake of sodium 1500mg , cholesterol 200mg , calories 30% from fat and 7% or less from saturated fats, daily to have 5 or more servings of fruits and vegetables.  Biometrics:     Pre Biometrics - 11/26/15 1455      Pre Biometrics   Height 5\' 11"  (1.803 m)   Weight 235 lb 0.2 oz (106.6 kg)   Waist Circumference 46.5 inches   Hip Circumference 43.75 inches   Waist to Hip Ratio 1.06 %   BMI (Calculated) 32.8   Triceps Skinfold 17 mm   % Body Fat 32.8 %   Grip Strength 77.6 kg   Flexibility 16.5 in   Single Leg Stand 2 seconds       Nutrition Therapy Plan and Nutrition Goals:   Nutrition Discharge: Rate Your Plate Scores:     Nutrition Assessments - 11/26/15 1510      MEDFICTS Scores   Pre Score 46      Nutrition  Goals Re-Evaluation:   Psychosocial: Target Goals: Acknowledge presence or absence of depression, maximize coping skills, provide positive support system. Participant is able to verbalize types and ability to use techniques and skills needed for reducing stress and depression.  Initial Review & Psychosocial Screening:     Initial Psych Review & Screening - 11/26/15 1514      Initial Review   Current issues with --  He has not issues.     Family Dynamics   Good Support System? Yes     Barriers   Psychosocial barriers to participate in program There are no identifiable barriers or psychosocial needs.     Screening Interventions   Interventions Encouraged to exercise      Quality of Life Scores:     Quality of Life - 11/26/15 1456      Quality of Life Scores   Health/Function Pre 22.6 %   Socioeconomic Pre 26.75 %   Psych/Spiritual Pre 29.29 %   Family Pre 26.4 %   GLOBAL Pre 25.43 %      PHQ-9: Recent Review Flowsheet Data    Depression screen [facility] 2/9 11/26/2015   Decreased Interest 0   Down, Depressed, Hopeless 0   PHQ - 2 Score 0   Altered sleeping 0   Tired, decreased energy 1   Change in appetite 1   Trouble concentrating 0   Moving slowly or fidgety/restless 0   Suicidal thoughts 0   PHQ-9 Score 2   Difficult doing work/chores Not difficult at all      Psychosocial Evaluation and Intervention:     Psychosocial Evaluation - 11/26/15 1515      Psychosocial Evaluation & Interventions  Interventions Encouraged to exercise with the program and follow exercise prescription   Continued Psychosocial Services Needed No      Psychosocial Re-Evaluation:     Psychosocial Re-Evaluation    [place] Name 12/04/15 1343 12/25/15 0815           Psychosocial Re-Evaluation   Interventions  - Encouraged to attend Cardiac Rehabilitation for the exercise      Comments Patient's QOL score was 25.43 and PHQ-9 score was 2. He is not depressed and does not need  counseling.  Patient's QOL score was 25.43 and PHQ-9 score was 2. He is not depressed and does not need counseling.       Continued Psychosocial Services Needed No No         Vocational Rehabilitation: Provide vocational rehab assistance to qualifying candidates.   Vocational Rehab Evaluation & Intervention:     Vocational Rehab - 11/26/15 1500      Initial Vocational Rehab Evaluation & Intervention   Assessment shows need for Vocational Rehabilitation No      Education: Education Goals: Education classes will be provided on a weekly basis, covering required topics. Participant will state understanding/return demonstration of topics presented.  Learning Barriers/Preferences:     Learning Barriers/Preferences - 11/26/15 1457      Learning Barriers/Preferences   Learning Barriers None   Learning Preferences Written Material;Skilled Demonstration      Education Topics: Hypertension, Hypertension Reduction -Define heart disease and high blood pressure. Discus how high blood pressure affects the body and ways to reduce high blood pressure. Flowsheet Row CARDIAC REHAB PHASE II EXERCISE from 12/17/2015 in [place]  Date  12/10/15  Educator  DC  Instruction Review Code  2- meets goals/outcomes      Exercise and Your Heart -Discuss why it is important to exercise, the FITT principles of exercise, normal and abnormal responses to exercise, and how to exercise safely. Flowsheet Row CARDIAC REHAB PHASE II EXERCISE from 12/17/2015 in [place]  Date  12/17/15  Educator  DC  Instruction Review Code  2- meets goals/outcomes      Angina -Discuss definition of angina, causes of angina, treatment of angina, and how to decrease risk of having angina.   Cardiac Medications -Review what the following cardiac medications are used for, how they affect the body, and side effects that may occur when taking the medications.  Medications  include Aspirin, Beta blockers, calcium channel blockers, ACE Inhibitors, angiotensin receptor blockers, diuretics, digoxin, and antihyperlipidemics.   Congestive Heart Failure -Discuss the definition of CHF, how to live with CHF, the signs and symptoms of CHF, and how keep track of weight and sodium intake.   Heart Disease and Intimacy -Discus the effect sexual activity has on the heart, how changes occur during intimacy as we age, and safety during sexual activity.   Smoking Cessation / COPD -Discuss different methods to quit smoking, the health benefits of quitting smoking, and the definition of COPD.   Nutrition I: Fats -Discuss the types of cholesterol, what cholesterol does to the heart, and how cholesterol levels can be controlled.   Nutrition II: Labels -Discuss the different components of food labels and how to read food label   Heart Parts and Heart Disease -Discuss the anatomy of the heart, the pathway of blood circulation through the heart, and these are affected by heart disease.   Stress I: Signs and Symptoms -Discuss the causes of stress, how stress may lead to anxiety and depression,  and ways to limit stress.   Stress II: Relaxation -Discuss different types of relaxation techniques to limit stress.   Warning Signs of Stroke / TIA -Discuss definition of a stroke, what the signs and symptoms are of a stroke, and how to identify when someone is having stroke. Flowsheet Row CARDIAC REHAB PHASE II EXERCISE from 12/17/2015 in [place]  Date  12/03/15  Educator  DJ  Instruction Review Code  2- meets goals/outcomes      Knowledge Questionnaire Score:     Knowledge Questionnaire Score - 11/26/15 1457      Knowledge Questionnaire Score   Pre Score 19/24      Core Components/Risk Factors/Patient Goals at Admission:     Personal Goals and Risk Factors at Admission - 11/26/15 1511      Core Components/Risk Factors/Patient Goals on  Admission    Weight Management Weight Maintenance   Increase Strength and Stamina Yes   Intervention Provide advice, education, support and counseling about physical activity/exercise needs.;Develop an individualized exercise prescription for aerobic and resistive training based on initial evaluation findings, risk stratification, comorbidities and participant's personal goals.   Expected Outcomes Achievement of increased cardiorespiratory fitness and enhanced flexibility, muscular endurance and strength shown through measurements of functional capacity and personal statement of participant.   Diabetes Yes   Intervention Provide education about signs/symptoms and action to take for hypo/hyperglycemia.   Expected Outcomes Long Term: Attainment of HbA1C < 7%.   Personal Goal Other Yes   Personal Goal Improve SOB, Be able to do as much as I can.    Intervention Come to CR 3 days/week and supplement with 2 mor days at home.    Expected Outcomes To reach personal goals.       Core Components/Risk Factors/Patient Goals Review:      Goals and Risk Factor Review    Row Name 11/26/15 1712 12/04/15 1340 12/25/15 0811         Core Components/Risk Factors/Patient Goals Review   Personal Goals Review Increase Strength and Stamina;Other  Be able to do more activities Increase Strength and Stamina;Improve shortness of breath with ADL's;Other  Be able to do as much as I can.  Increase Strength and Stamina;Improve shortness of breath with ADL's;Other     Review  - Patient has had 3 sessions and has done well.  Patient is gaining strength ans stamina. He is planning to go back to work after 18 sessions.      Expected Outcomes To decrease SOB, and to increase acitivities Continue to work toward goal of improved SOB and being able to do more.  Continue to work toward goal of improved SOB and being able to do more.         Core Components/Risk Factors/Patient Goals at Discharge (Final Review):       Goals and Risk Factor Review - 12/25/15 0811      Core Components/Risk Factors/Patient Goals Review   Personal Goals Review Increase Strength and Stamina;Improve shortness of breath with ADL's;Other   Review Patient is gaining strength ans stamina. He is planning to go back to work after 18 sessions.    Expected Outcomes Continue to work toward goal of improved SOB and being able to do more.       ITP Comments:   Comments: Thirty day Review. Patient is doing well. Will continue to monitor.

## 2015-12-25 NOTE — Telephone Encounter (Signed)
Called pt back and let him know that he doesn't have to do anything his monitor at home will transmit automatically. Pt voiced understanding.

## 2015-12-25 NOTE — Telephone Encounter (Signed)
New Message  Pt call requesting to speak with RN. Pt had some questions about pace maker. Pt wants to know if there is anything he sound be doing different with the machine. Pt states he doesn't remember much information given to him about it and would like to speak with RN for further information. Please call back to discuss

## 2015-12-26 ENCOUNTER — Encounter (HOSPITAL_COMMUNITY)
Admission: RE | Admit: 2015-12-26 | Discharge: 2015-12-26 | Disposition: A | Discharge status: Home / Self Care | Payer: Medicare Other | Source: Ambulatory Visit | Attending: Cardiovascular Disease | Admitting: Cardiovascular Disease

## 2015-12-26 ENCOUNTER — Encounter (HOSPITAL_COMMUNITY): Payer: Medicare Other

## 2015-12-26 DIAGNOSIS — I214 Non-ST elevation (NSTEMI) myocardial infarction: Secondary | ICD-10-CM | POA: Diagnosis not present

## 2015-12-26 DIAGNOSIS — Z9889 Other specified postprocedural states: Secondary | ICD-10-CM | POA: Diagnosis not present

## 2015-12-26 NOTE — Progress Notes (Signed)
Daily Session Note  Patient Details  Name: Christian Rasmussen MRN: [id number] Date of Birth: 1940/06/03 Referring Provider:   Flowsheet Row CARDIAC REHAB PHASE II ORIENTATION from 11/26/2015 in [place]  Referring Provider  Dr. Bronson Ing      Encounter Date: 12/26/2015  Check In:     Session Check In - 12/26/15 1505      Check-In   Location [facility] & Pulmonary Rehab   Staff Present Russella Dar, MS, EP, [facility], Exercise Physiologist;Debra Wynetta Emery, RN, BSN   Supervising physician immediately available to respond to emergencies See telemetry face sheet for immediately available MD   Medication changes reported     No   Fall or balance concerns reported    No   Warm-up and Cool-down Performed as group-led instruction   Resistance Training Performed Yes   VAD Patient? No     Pain Assessment   Currently in Pain? No/denies   Multiple Pain Sites No      Capillary Blood Glucose: No results found for this or any previous visit (from the past 24 hour(s)).   Goals Met:  Independence with exercise equipment Exercise tolerated well No report of cardiac concerns or symptoms Strength training completed today  Goals Unmet:  Not Applicable  Comments: Check out: [place]   Dr. Kate Sable is Medical Director for [place] and Pulmonary Rehab.

## 2015-12-29 ENCOUNTER — Encounter (HOSPITAL_COMMUNITY)
Admission: RE | Admit: 2015-12-29 | Discharge: 2015-12-29 | Disposition: A | Discharge status: Home / Self Care | Payer: Medicare Other | Source: Ambulatory Visit | Attending: Cardiovascular Disease | Admitting: Cardiovascular Disease

## 2015-12-29 ENCOUNTER — Encounter (HOSPITAL_COMMUNITY): Payer: Medicare Other

## 2015-12-29 DIAGNOSIS — Z9889 Other specified postprocedural states: Secondary | ICD-10-CM

## 2015-12-29 DIAGNOSIS — I214 Non-ST elevation (NSTEMI) myocardial infarction: Secondary | ICD-10-CM | POA: Diagnosis not present

## 2015-12-29 NOTE — Progress Notes (Signed)
Daily Session Note  Patient Details  Name: JAQUILLE KAU MRN: [id number] Date of Birth: 1941/05/01 Referring Provider:   Flowsheet Row CARDIAC REHAB PHASE II ORIENTATION from 11/26/2015 in [place]  Referring Provider  Dr. Bronson Ing      Encounter Date: 12/29/2015  Check In:     Session Check In - 12/29/15 1608      Check-In   Location [facility]-Card[facility] Pul[facility] Rehab   Staff Present Russella Dar, MS, EP, [facility], Exercise Physiologist;Debra Wynetta Emery, RN, BSN   Supervising physician immediately available to respond to emergencies See telemetry face sheet for immediately available MD   Medication changes reported     No   Fall or balance concerns reported    No   Warm-up and Cool-down Performed as group-led instruction   Resistance Training Performed Yes   VAD Patient? No     Pain Assessment   Currently in Pain? No/denies   Pain Score 0-No pain   Multiple Pain Sites No      Capillary Blood Glucose: No results found for this or any previous visit (from the past 24 hour(s)).   Goals Met:  Independence with exercise equipment Exercise tolerated well No report of cardiac concerns or symptoms Strength training completed today  Goals Unmet:  Not Applicable  Comments: Check out: 4:45   Dr. Kate Sable is Medical Director for [place] and Pulmonary Rehab.

## 2015-12-31 ENCOUNTER — Encounter (HOSPITAL_COMMUNITY)
Admission: RE | Admit: 2015-12-31 | Discharge: 2015-12-31 | Disposition: A | Discharge status: Home / Self Care | Payer: Medicare Other | Source: Ambulatory Visit | Attending: Cardiovascular Disease | Admitting: Cardiovascular Disease

## 2015-12-31 ENCOUNTER — Encounter (HOSPITAL_COMMUNITY): Payer: Medicare Other

## 2015-12-31 DIAGNOSIS — I214 Non-ST elevation (NSTEMI) myocardial infarction: Secondary | ICD-10-CM

## 2015-12-31 DIAGNOSIS — Z9889 Other specified postprocedural states: Secondary | ICD-10-CM

## 2015-12-31 NOTE — Progress Notes (Signed)
Daily Session Note  Patient Details  Name: Christian Rasmussen MRN: [id number] Date of Birth: 1941-05-08 Referring Provider:   Flowsheet Row CARDIAC REHAB PHASE II ORIENTATION from 11/26/2015 in [place]  Referring Provider  Dr. Bronson Ing      Encounter Date: 12/31/2015  Check In:     Session Check In - 12/31/15 1545      Check-In   Location [facility] Pul[facility] Re[facility]   Staff Present Ziggy Reveles Angelina Pih, MS, EP, [facility], Exercise Physiologist;Debra Wynetta Emery, RN, BSN   Supervising physician immediately available to respond to emergencies See telemetry face sheet for immediately available MD   Medication changes reported     No   Fall or balance concerns reported    No   Warm-up and Cool-down Performed as group-led instruction   Resistance Training Performed Yes   VAD Patient? No     Pain Assessment   Pain Score 0-No pain   Multiple Pain Sites No      Capillary Blood Glucose: No results found for this or any previous visit (from the past 24 hour(s)).   Goals Met:  Independence with exercise equipment Exercise tolerated well No report of cardiac concerns or symptoms Strength training completed today  Goals Unmet:  Not Applicable  Comments: Check out 1645   Dr. Kate Sable is Medical Director for [place] and Pulmonary Rehab.

## 2016-01-02 ENCOUNTER — Encounter (HOSPITAL_COMMUNITY): Payer: Medicare Other

## 2016-01-02 ENCOUNTER — Encounter (HOSPITAL_COMMUNITY)
Admission: RE | Admit: 2016-01-02 | Discharge: 2016-01-02 | Disposition: A | Discharge status: Home / Self Care | Payer: Medicare Other | Source: Ambulatory Visit | Attending: Cardiovascular Disease | Admitting: Cardiovascular Disease

## 2016-01-02 DIAGNOSIS — Z9889 Other specified postprocedural states: Secondary | ICD-10-CM | POA: Diagnosis not present

## 2016-01-02 DIAGNOSIS — I214 Non-ST elevation (NSTEMI) myocardial infarction: Secondary | ICD-10-CM

## 2016-01-02 NOTE — Progress Notes (Signed)
Daily Session Note  Patient Details  Name: Christian Rasmussen MRN: [id number] Date of Birth: 05/07/1941 Referring Provider:   Flowsheet Row CARDIAC REHAB PHASE II ORIENTATION from 11/26/2015 in [place] CARDIAC REHABILITATION  Referring Provider  Dr. Koneswaran      Encounter Date: 01/02/2016  Check In:     Session Check In - 01/02/16 1545      Check-In   Location [facility]   Staff Present Diane Coad, MS, EP, [facility], Exercise Physiologist;Debra Johnson, RN, BSN   Supervising physician immediately available to respond to emergencies See telemetry face sheet for immediately available MD   Medication changes reported     No   Fall or balance concerns reported    No   Warm-up and Cool-down Performed as group-led instruction   Resistance Training Performed Yes   VAD Patient? No     Pain Assessment   Currently in Pain? No/denies   Pain Score 0-No pain   Multiple Pain Sites No      Capillary Blood Glucose: No results found for this or any previous visit (from the past 24 hour(s)).   Goals Met:  Independence with exercise equipment Exercise tolerated well No report of cardiac concerns or symptoms Strength training completed today  Goals Unmet:  Not Applicable  Comments: Check out: 4:45   Dr. Suresh Koneswaran is Medical Director for [place] Cardiac and Pulmonary Rehab. 

## 2016-01-05 ENCOUNTER — Encounter (HOSPITAL_COMMUNITY)
Admission: RE | Admit: 2016-01-05 | Discharge: 2016-01-05 | Disposition: A | Discharge status: Home / Self Care | Payer: Medicare Other | Source: Ambulatory Visit | Attending: Cardiovascular Disease | Admitting: Cardiovascular Disease

## 2016-01-05 ENCOUNTER — Encounter (HOSPITAL_COMMUNITY): Payer: Medicare Other

## 2016-01-05 DIAGNOSIS — I214 Non-ST elevation (NSTEMI) myocardial infarction: Secondary | ICD-10-CM | POA: Diagnosis not present

## 2016-01-05 DIAGNOSIS — Z9889 Other specified postprocedural states: Secondary | ICD-10-CM | POA: Diagnosis not present

## 2016-01-06 NOTE — Progress Notes (Signed)
Daily Session Note  Patient Details  Name: Christian Rasmussen MRN: [id number] Date of Birth: 1940-07-21 Referring Provider:   Flowsheet Row CARDIAC REHAB PHASE II ORIENTATION from 11/26/2015 in [place]  Referring Provider  Dr. Bronson Ing      Encounter Date: 01/05/2016  Check In:   Capillary Blood Glucose: No results found for this or any previous visit (from the past 24 hour(s)).   Goals Met:  Independence with exercise equipment Exercise tolerated well No report of cardiac concerns or symptoms Strength training completed today  Goals Unmet:  Not Applicable  Comments: Check out 4:45   Dr. Kate Sable is Medical Director for [facility] and [facility].

## 2016-01-07 ENCOUNTER — Ambulatory Visit (INDEPENDENT_AMBULATORY_CARE_PROVIDER_SITE_OTHER): Payer: Medicare Other | Admitting: *Deleted

## 2016-01-07 ENCOUNTER — Encounter (HOSPITAL_COMMUNITY)
Admission: RE | Admit: 2016-01-07 | Discharge: 2016-01-07 | Disposition: A | Discharge status: Home / Self Care | Payer: Medicare Other | Source: Ambulatory Visit | Attending: Cardiovascular Disease | Admitting: Cardiovascular Disease

## 2016-01-07 ENCOUNTER — Encounter (HOSPITAL_COMMUNITY): Payer: Medicare Other

## 2016-01-07 DIAGNOSIS — Z9889 Other specified postprocedural states: Secondary | ICD-10-CM

## 2016-01-07 DIAGNOSIS — I4892 Unspecified atrial flutter: Secondary | ICD-10-CM

## 2016-01-07 DIAGNOSIS — I214 Non-ST elevation (NSTEMI) myocardial infarction: Secondary | ICD-10-CM | POA: Diagnosis not present

## 2016-01-07 DIAGNOSIS — Z5181 Encounter for therapeutic drug level monitoring: Secondary | ICD-10-CM | POA: Diagnosis not present

## 2016-01-07 LAB — POCT INR: INR: 2.5

## 2016-01-07 MED ORDER — WARFARIN SODIUM 5 MG PO TABS: ORAL_TABLET | ORAL | 3 refills | Status: DC

## 2016-01-07 NOTE — Progress Notes (Signed)
Daily Session Note  Patient Details  Name: Christian Rasmussen MRN: [id number] Date of Birth: 08/28/1940 Referring Provider:   Flowsheet Row CARDIAC REHAB PHASE II ORIENTATION from 11/26/2015 in [place] [facility]  Referring Provider  Dr. Koneswaran      Encounter Date: 01/07/2016  Check In:     Session Check In - 01/07/16 1545      Check-In   Location [facility]   Staff Present Diane Coad, MS, EP, CH[facility], Exercise Physiologist;Debra Johnson, RN, BSN   Supervising physician immediately available to respond to emergencies See telemetry face sheet for immediately available MD   Medication changes reported     No   Fall or balance concerns reported    No   Warm-up and Cool-down Performed as group-led instruction   Resistance Training Performed Yes   VAD Patient? No     Pain Assessment   Currently in Pain? No/denies   Pain Score 0-No pain   Multiple Pain Sites No      Capillary Blood Glucose: Results for orders placed or performed in visit on 01/07/16 (from the past 24 hour(s))  POCT INR     Status: Normal   Collection Time: 01/07/16  2:24 PM  Result Value Ref Range   INR 2.5      Goals Met:  Independence with exercise equipment Exercise tolerated well No report of cardiac concerns or symptoms Strength training completed today  Goals Unmet:  Not Applicable  Comments: Check out: 4:45   Dr. Suresh Koneswaran is Medical Director for [facility]. 

## 2016-01-09 ENCOUNTER — Other Ambulatory Visit: Payer: Self-pay | Admitting: Cardiovascular Disease

## 2016-01-09 ENCOUNTER — Encounter (HOSPITAL_COMMUNITY): Payer: Medicare Other

## 2016-01-09 ENCOUNTER — Encounter (HOSPITAL_COMMUNITY)
Admission: RE | Admit: 2016-01-09 | Discharge: 2016-01-09 | Disposition: A | Discharge status: Home / Self Care | Payer: Medicare Other | Source: Ambulatory Visit | Attending: Cardiovascular Disease | Admitting: Cardiovascular Disease

## 2016-01-09 ENCOUNTER — Ambulatory Visit: Payer: Medicare Other | Admitting: Adult Health

## 2016-01-09 VITALS — Ht 71.0 in | Wt 235.5 lb

## 2016-01-09 DIAGNOSIS — I214 Non-ST elevation (NSTEMI) myocardial infarction: Secondary | ICD-10-CM | POA: Diagnosis not present

## 2016-01-09 DIAGNOSIS — Z9889 Other specified postprocedural states: Secondary | ICD-10-CM | POA: Insufficient documentation

## 2016-01-09 NOTE — Progress Notes (Signed)
Daily Session Note  Patient Details  Name: GUERIN LASHOMB MRN: [id number] Date of Birth: 11-24-1940 Referring Provider:   Flowsheet Row CARDIAC REHAB PHASE II ORIENTATION from 11/26/2015 in [place]  Referring Provider  Dr. Bronson Ing      Encounter Date: 01/09/2016  Check In:     Session Check In - 01/09/16 1545      Check-In   Location [facility]   Staff Present Ar[place]a [place] Angelina Pih, MS, EP, [facility], Exercise Physiologist;Debra Wynetta Emery, RN, BSN   Supervising physician immediately available to respond to emergencies See telemetry face sheet for immediately available MD   Medication changes reported     No   Fall or balance concerns reported    No   Warm-up and Cool-down Performed as group-led instruction   Resistance Training Performed Yes   VAD Patient? No     Pain Assessment   Currently in Pain? No/denies   Pain Score 0-No pain   Multiple Pain Sites No      Capillary Blood Glucose: No results found for this or any previous visit (from the past 24 hour(s)).   Goals Met:  Independence with exercise equipment Exercise tolerated well No report of cardiac concerns or symptoms Strength training completed today  Goals Unmet:  Not Applicable  Comments: Check out: 4:45    Dr. Kate Sable is Medical Director for [place] and Pulmonary Re[facility].

## 2016-01-12 ENCOUNTER — Encounter (HOSPITAL_COMMUNITY): Payer: Medicare Other

## 2016-01-14 ENCOUNTER — Encounter (HOSPITAL_COMMUNITY): Payer: Medicare Other

## 2016-01-14 NOTE — Progress Notes (Signed)
Discharge Summary  Patient Details  Name: Christian Rasmussen MRN: [id number] Date of Birth: 03/25/1941 Referring Provider:   Flowsheet Row CARDIAC REHAB PHASE II ORIENTATION from 11/26/2015 in [place]  Referring Provider  Dr. Bronson Ing       Number of Visits: 18  Reason for Discharge:  Early Exit:  Back to work.  Patient stopped the program after 18 sessions. He says he feels stronger and the program has benefited him but he wants to get back to work which requires travel that would interfere with his attendance. He was given graduation information  Smoking History:  History  Smoking Status  . Former Smoker  . Packs/day: 1.00  . Years: 28.00  . Types: Cigarettes  . Start date: 10/21/1956  . Quit date: 05/10/1985  Smokeless Tobacco  . Never Used    Diagnosis:  NSTEMI (non-ST elevated myocardial infarction) ([place])  S/P mitral valve repair  ADL UCSD:   Initial Exercise Prescription:     Initial Exercise Prescription - 11/26/15 1400      Date of Initial Exercise RX and Referring Provider   Date 11/26/15   Referring Provider Dr. Bronson Ing     Treadmill   MPH 1.3   Grade 0   Minutes 15   METs 1.9     NuStep   Level 2   Watts 15   Minutes 20   METs 1.9     Prescription Details   Frequency (times per week) 3   Duration Progress to 30 minutes of continuous aerobic without signs/symptoms of physical distress     Intensity   THRR REST +  30   THRR 40-80% of Max Heartrate [phone]   Ratings of Perceived Exertion 11-13   Perceived Dyspnea 0-4     Progression   Progression Continue to progress workloads to maintain intensity without signs/symptoms of physical distress.     Resistance Training   Training Prescription Yes   Weight 1   Reps 10-12      Discharge Exercise Prescription (Final Exercise Prescription Changes):     Exercise Prescription Changes - 12/16/15 1300      Exercise Review   Progression Yes     Response to  Exercise   Blood Pressure (Admit) 118/60   Blood Pressure (Exercise) 128/70   Blood Pressure (Exit) 112/66   Heart Rate (Admit) 88 bpm   Heart Rate (Exercise) 114 bpm   Heart Rate (Exit) 101 bpm   Rating of Perceived Exertion (Exercise) 11   Duration Progress to 30 minutes of continuous aerobic without signs/symptoms of physical distress   Intensity Rest + 30     Progression   Progression Continue progressive overload as per policy without signs/symptoms or physical distress.     Resistance Training   Training Prescription Yes   Weight 3   Reps 10-12     NuStep   Level 0   Watts 0   Minutes 0   METs 0     Arm Ergometer   Level 2.5   Watts 24   Minutes 15   METs 2.9     Recumbant Elliptical   Level 2   RPM 46   Watts 59   Minutes 20   METs 3.1     Home Exercise Plan   Plans to continue exercise at Home   Frequency Add 2 additional days to program exercise sessions.      Functional Capacity:     6 Minute Walk  [place] Name 11/26/15 1453 01/13/16 1009       6 Minute Walk   Phase Initial Discharge    Distance 1150 feet 1200 feet    Distance % Change  - 4.35 %    Walk Time 6 minutes 6 minutes    # of Rest Breaks 0 0    MPH 2.17 2.27    METS 2.66 2.74    RPE 15 15    Perceived Dyspnea  14 16    VO2 Peak 9.8 9.43    Symptoms No No    Resting HR 86 bpm 90 bpm    Resting BP 140/68 126/64    Max Ex. HR 118 bpm 110 bpm    Max Ex. BP 180/84 170/80    2 Minute Post BP 144/70 130/62       Psychological, QOL, Others - Outcomes: PHQ 2/9: Depression screen PHQ 2/9 11/26/2015  Decreased Interest 0  Down, Depressed, Hopeless 0  PHQ - 2 Score 0  Altered sleeping 0  Tired, decreased energy 1  Change in appetite 1  Trouble concentrating 0  Moving slowly or fidgety/restless 0  Suicidal thoughts 0  PHQ-9 Score 2  Difficult doing work/chores Not difficult at all    Quality of Life:     Quality of Life - 11/26/15 1456      Quality of Life Scores    Health/Function Pre 22.6 %   Socioeconomic Pre 26.75 %   Psych/Spiritual Pre 29.29 %   Family Pre 26.4 %   GLOBAL Pre 25.43 %      Personal Goals: Goals established at orientation with interventions provided to work toward goal.     Personal Goals and Risk Factors at Admission - 11/26/15 1511      Core Components/Risk Factors/Patient Goals on Admission    Weight Management Weight Maintenance   Increase Strength and Stamina Yes   Intervention Provide advice, education, support and counseling about physical activity/exercise needs.;Develop an individualized exercise prescription for aerobic and resistive training based on initial evaluation findings, risk stratification, comorbidities and participant's personal goals.   Expected Outcomes Achievement of increased cardiorespiratory fitness and enhanced flexibility, muscular endurance and strength shown through measurements of functional capacity and personal statement of participant.   Diabetes Yes   Intervention Provide education about signs/symptoms and action to take for hypo/hyperglycemia.   Expected Outcomes Long Term: Attainment of HbA1C < 7%.   Personal Goal Other Yes   Personal Goal Improve SOB, Be able to do as much as I can.    Intervention Come to CR 3 days/week and supplement with 2 mor days at home.    Expected Outcomes To reach personal goals.        Personal Goals Discharge:     Goals and Risk Factor Review    Row Name 11/26/15 1712 12/04/15 1340 12/25/15 0811         Core Components/Risk Factors/Patient Goals Review   Personal Goals Review Increase Strength and Stamina;Other  Be able to do more activities Increase Strength and Stamina;Improve shortness of breath with ADL's;Other  Be able to do as much as I can.  Increase Strength and Stamina;Improve shortness of breath with ADL's;Other     Review  - Patient has had 3 sessions and has done well.  Patient is gaining strength ans stamina. He is planning to go back to  work after 18 sessions.      Expected Outcomes To decrease SOB, and to increase acitivities Continue to work  toward goal of improved SOB and being able to do more.  Continue to work toward goal of improved SOB and being able to do more.         Nutrition & Weight - Outcomes:     Pre Biometrics - 11/26/15 1455      Pre Biometrics   Height 5\' 11"  (1.803 m)   Weight 235 lb 0.2 oz (106.6 kg)   Waist Circumference 46.5 inches   Hip Circumference 43.75 inches   Waist to Hip Ratio 1.06 %   BMI (Calculated) 32.8   Triceps Skinfold 17 mm   % Body Fat 32.8 %   Grip Strength 77.6 kg   Flexibility 16.5 in   Single Leg Stand 2 seconds         Post Biometrics - 01/13/16 1011       Post  Biometrics   Height 5\' 11"  (1.803 m)   Weight 235 lb 7.2 oz (106.8 kg)   Waist Circumference 46.5 inches   Hip Circumference 43.75 inches   Waist to Hip Ratio 1.06 %   BMI (Calculated) 32.9   Triceps Skinfold 17 mm   % Body Fat 32.8 %   Grip Strength 77.7 kg   Flexibility 16.5 in   Single Leg Stand 2 seconds      Nutrition:   Nutrition Discharge:     Nutrition Assessments - 11/26/15 1510      MEDFICTS Scores   Pre Score 46      Education Questionnaire Score:     Knowledge Questionnaire Score - 11/26/15 1457      Knowledge Questionnaire Score   Pre Score 19/24

## 2016-01-15 ENCOUNTER — Telehealth: Payer: Self-pay | Admitting: *Deleted

## 2016-01-15 ENCOUNTER — Telehealth: Payer: Self-pay

## 2016-01-15 ENCOUNTER — Encounter: Payer: Self-pay | Admitting: *Deleted

## 2016-01-15 NOTE — Telephone Encounter (Signed)
Christian Rowan, RN called from pt's dental office to get clearance for pt to have dental cleaning. Asked if pt was pre-med. RN informed pt had open heart surgery in June of this year. Referred this to Dr. Acie Fredrickson.   Dr. Acie Fredrickson referred this question to Dr. Court Joy office to advise.   I relayed this information to RN. I gave her the phone number to contact their office.

## 2016-01-15 NOTE — Telephone Encounter (Signed)
-----   Message from Herminio Commons, MD sent at 01/15/2016 10:18 AM EDT ----- Regarding: RE: Dental clearance  Can proceed.  ----- Message ----- From: Levonne Hubert, LPN Sent: [id number]   8:58 AM To: Herminio Commons, MD Subject: Dental clearance                               Patient is having dental cleaning this morning, and Hassan Rowan in the office would like to know if patient is cleared to do so after recent heart surgery. Please advise.

## 2016-01-16 ENCOUNTER — Encounter (HOSPITAL_COMMUNITY): Payer: Medicare Other

## 2016-01-17 ENCOUNTER — Other Ambulatory Visit: Payer: Self-pay | Admitting: Thoracic Surgery (Cardiothoracic Vascular Surgery)

## 2016-01-19 ENCOUNTER — Encounter (HOSPITAL_COMMUNITY): Payer: Medicare Other

## 2016-01-21 ENCOUNTER — Encounter (HOSPITAL_COMMUNITY): Payer: Medicare Other

## 2016-01-22 ENCOUNTER — Other Ambulatory Visit: Payer: Self-pay | Admitting: Thoracic Surgery (Cardiothoracic Vascular Surgery)

## 2016-01-23 ENCOUNTER — Encounter (HOSPITAL_COMMUNITY): Payer: Medicare Other

## 2016-01-23 ENCOUNTER — Encounter: Payer: Self-pay | Admitting: Internal Medicine

## 2016-01-23 ENCOUNTER — Ambulatory Visit (INDEPENDENT_AMBULATORY_CARE_PROVIDER_SITE_OTHER): Payer: Medicare Other | Admitting: Internal Medicine

## 2016-01-23 VITALS — BP 124/80 | HR 89 | Ht 71.0 in | Wt 234.0 lb

## 2016-01-23 DIAGNOSIS — I2510 Atherosclerotic heart disease of native coronary artery without angina pectoris: Secondary | ICD-10-CM

## 2016-01-23 DIAGNOSIS — I4510 Unspecified right bundle-branch block: Secondary | ICD-10-CM

## 2016-01-23 LAB — CUP PACEART INCLINIC DEVICE CHECK
Date Time Interrogation Session: 20170915040000
Implantable Lead Implant Date: 20170612
Implantable Lead Model: 7742
Implantable Lead Serial Number: 733074
Implantable Lead Serial Number: 760165
Lead Channel Impedance Value: 738 Ohm
Lead Channel Pacing Threshold Amplitude: 0.9 V
Lead Channel Setting Pacing Pulse Width: 0.4 ms
Lead Channel Setting Sensing Sensitivity: 2.5 mV
MDC IDC LEAD IMPLANT DT: 20170612
MDC IDC LEAD LOCATION: 753859
MDC IDC LEAD LOCATION: 753860
MDC IDC MSMT LEADCHNL RA PACING THRESHOLD PULSEWIDTH: 0.4 ms
MDC IDC MSMT LEADCHNL RA SENSING INTR AMPL: 5.1 mV
MDC IDC MSMT LEADCHNL RV IMPEDANCE VALUE: 868 Ohm
MDC IDC MSMT LEADCHNL RV PACING THRESHOLD AMPLITUDE: 0.9 V
MDC IDC MSMT LEADCHNL RV PACING THRESHOLD PULSEWIDTH: 0.4 ms
MDC IDC PG SERIAL: 724121
MDC IDC SET LEADCHNL RA PACING AMPLITUDE: 3.5 V
MDC IDC SET LEADCHNL RV PACING AMPLITUDE: 1.2 V

## 2016-01-23 MED ORDER — POTASSIUM CHLORIDE ER 10 MEQ PO TBCR: 10.0000 meq | EXTENDED_RELEASE_TABLET | Freq: Every day | ORAL | 3 refills | Status: DC

## 2016-01-23 NOTE — Progress Notes (Signed)
HPI [name] returns today for followup. He is a pleasant 75 yo man with mitral valve disease, s/p repair, CABG complicated by the development of CHB. He underwent PPM insertion about 3 months ago. He has had some atrial fib and flutter in the interim and is on anti-coagulation. He feels well. No chest pain or sob. He is fairly active. No syncope.  No Known Allergies   Current Outpatient Prescriptions  Medication Sig Dispense Refill  . amLODipine (NORVASC) 10 MG tablet Take 1 tablet (10 mg total) by mouth daily. 30 tablet 6  . aspirin EC 81 MG tablet Take 1 tablet (81 mg total) by mouth daily. 90 tablet 3  . ASTAXANTHIN PO Take 10 mg by mouth daily. Reported on 10/30/2015    . atorvastatin (LIPITOR) 40 MG tablet Take 1 tablet by mouth daily. Reported on 11/24/2015    . carvedilol (COREG) 6.25 MG tablet Take 2 tablets (12.5 mg total) by mouth 2 (two) times daily. 360 tablet 3  . ferrous [id number] C-folic acid (TRINSICON / FOLTRIN) capsule Take 1 capsule by mouth 2 (two) times daily with a meal. 60 capsule 6  . furosemide (LASIX) 40 MG tablet TAKE ONE TABLET BY MOUTH ONCE DAILY 30 tablet 1  . lisinopril (PRINIVIL,ZESTRIL) 20 MG tablet Take 1 tablet (20 mg total) by mouth daily. 90 tablet 3  . metFORMIN (GLUCOPHAGE) 500 MG tablet Take 1,000 mg by mouth 2 (two) times daily with a meal. Reported on 10/30/2015  2  . potassium chloride (K-DUR) 10 MEQ tablet Take 1 tablet (10 mEq total) by mouth daily. 90 tablet 3  . warfarin (COUMADIN) 5 MG tablet Take coumadin 1 1/2 tablets daily 60 tablet 3   No current facility-administered medications for this visit.      Past Medical History:  Diagnosis Date  . 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 Aortic stenosis 10/14/2015  . CAD (coronary artery disease)    status post Taxus stent patency of RCA 2004, Cardiolite negative for ischemia in 2010.  . Diabetes mellitus ([place])   . Dyslipidemia   . History of kidney stones   .  Hypertension   . Mitral regurgitation 10/15/2015   Moderate-severe by intra-operative TEE  . Overweight   . Right bundle branch block (RBBB) with left anterior hemiblock   . 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 Snores   . Typical atrial flutter ([place])    s/p ablation 11-23-2012 by Dr Rayann Heman    ROS:   All systems reviewed and negative except as noted in the HPI.   Past Surgical History:  Procedure Laterality Date  . ABLATION  11-23-2012   s/p cavotricuspid isthmus ablation by Dr Rayann Heman  . ANKLE SURGERY Left    total ankle replacement  . ATRIAL FLUTTER ABLATION N/A 11/23/2012   Procedure: ATRIAL FLUTTER ABLATION;  Surgeon: Thompson Grayer, MD;  Location: [facility] CATH LAB;  Service: Cardiovascular;  Laterality: N/A;  . CARDIAC CATHETERIZATION N/A 10/13/2015   Procedure: Left Heart Cath and Coronary Angiography;  Surgeon: Peter M Martinique, MD;  Location: [place] CV LAB;  Service: Cardiovascular;  Laterality: N/A;  . CATARACT EXTRACTION Right   .  CATARACT EXTRACTION W/PHACO Left 09/13/2013   Procedure: CATARACT EXTRACTION PHACO AND INTRAOCULAR LENS PLACEMENT (IOC);  Surgeon: Tonny Branch, MD;  Location: [facility] ORS;  Service: Ophthalmology;  Laterality: Left;  CDE 9.38  . COLONOSCOPY N/A 10/10/2014   Procedure: COLONOSCOPY;  Surgeon: Rogene Houston, MD;  Location: [facility] ENDO SUITE;  Service: Endoscopy;  Laterality: N/A;  830  . CORONARY ANGIOPLASTY     3 stents  . CORONARY ARTERY BYPASS GRAFT N/A 10/15/2015   Procedure: CORONARY ARTERY BYPASS GRAFTING (CABG) X4 LIMA-LAD; SEQ SVG-PD-PL; SVG-OM1 ENDOSCOPIC GREATER SAPHENOUS VEIN HARVEST(EVH) BILAT LOWER EXTREM;  Surgeon: Rexene Alberts, MD;   Location: [place];  Service: Open Heart Surgery;  Laterality: N/A;  . EP IMPLANTABLE DEVICE N/A 10/20/2015   Procedure: Pacemaker Implant;  Surgeon: Evans Lance, MD;  Location: [place] CV LAB;  Service: Cardiovascular;  Laterality: N/A;  . MITRAL VALVE REPAIR N/A 10/15/2015   Procedure: MITRAL VALVE REPAIR (MVR), # 28 MEMO 3-D RING ANNULOPLASTY AND COMPLEX VALVE REPAIR;  Surgeon: Rexene Alberts, MD;  Location: [place];  Service: Open Heart Surgery;  Laterality: N/A;  . TEE WITHOUT CARDIOVERSION N/A 10/15/2015   Procedure: TRANSESOPHAGEAL ECHOCARDIOGRAM (TEE);  Surgeon: Rexene Alberts, MD;  Location: [place];  Service: Open Heart Surgery;  Laterality: N/A;     Family History  Problem Relation Age of Onset  . Other Father 56    Died from MI.  . Other Mother     alive & well  . Leukemia Brother 15    died     Social History   Social History  . Marital status: Married    Spouse name: N/A  . Number of children: N/A  . Years of education: N/A   Occupational History  . 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 Smoking status: Former Smoker    Packs/day: 1.00    Years: 28.00    Types: Cigarettes    Start date: 10/21/1956    Quit date: 05/10/1985  . Smokeless tobacco: Never Used  . Alcohol use 0.0 oz/week     Comment: occasional wine  . Drug use: No  . Sexual activity: Yes    Birth control/ protection: None   Other Topics Concern  . Not on file   Social History Narrative   Lives in [place] with spouse.   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     BP 124/80   Pulse 89   Ht 5\' 11"  (1.803 m)   Wt 234 lb (106.1 kg)   BMI 32.64 kg/m   Physical Exam:  Well appearing 75 yo man, NAD HEENT: Unremarkable Neck:  6 cm JVD, no thyromegally Lymphatics:  No adenopathy Back:  No CVA tenderness Lungs:  Clear with no wheezes HEART:  Regular rate rhythm, no murmurs, no rubs, no clicks Abd:  soft, positive bowel sounds, no organomegally, no rebound, no  guarding Ext:  2 plus pulses, no edema, no cyanosis, no clubbing Skin:  No rashes no nodules Neuro:  CN II through XII intact, motor grossly intact  EKG - NSR with ventricular pacing  DEVICE  Normal device function.  See PaceArt for details.   Assess/Plan: 1. CHB - he is doing well s/p PPM 2. PAF - he is back in rhythm today. He will continue his current meds. 3. PPM - his [employer] DDD PM is working normally. Will recheck in several months.  Mikle Bosworth.D.

## 2016-01-23 NOTE — Telephone Encounter (Signed)
Refill: potassium chloride (K-DUR) [place]

## 2016-01-23 NOTE — Telephone Encounter (Signed)
Done

## 2016-01-23 NOTE — Patient Instructions (Signed)
Medication Instructions:  Your physician recommends that you continue on your current medications as directed. Please refer to the Current Medication list given to you today.   Labwork: none  Testing/Procedures: none  Follow-Up: Remote monitoring is used to monitor your Pacemaker or ICD from home. This monitoring reduces the number of office visits required to check your device to one time per year. It allows [place] to keep an eye on the functioning of your device to ensure it is working properly. You are scheduled for a device check from home on 04/26/2016. You may send your transmission at any time that day. If you have a wireless device, the transmission will be sent automatically. After your physician reviews your transmission, you will receive a postcard with your next transmission date.  Your physician wants you to follow-up in: 12 months with Dr. Lovena Le. You will receive a reminder letter in the mail two months in advance. If you don't receive a letter, please call our office to schedule the follow-up appointment.   Any Other Special Instructions Will Be Listed Below (If Applicable).     If you need a refill on your cardiac medications before your next appointment, please call your pharmacy.

## 2016-01-26 ENCOUNTER — Ambulatory Visit (INDEPENDENT_AMBULATORY_CARE_PROVIDER_SITE_OTHER): Payer: Medicare Other | Admitting: Thoracic Surgery (Cardiothoracic Vascular Surgery)

## 2016-01-26 ENCOUNTER — Encounter (HOSPITAL_COMMUNITY): Payer: Medicare Other

## 2016-01-26 ENCOUNTER — Encounter: Payer: Self-pay | Admitting: Thoracic Surgery (Cardiothoracic Vascular Surgery)

## 2016-01-26 VITALS — BP 115/70 | HR 91 | Resp 18 | Ht 71.0 in | Wt 234.0 lb

## 2016-01-26 DIAGNOSIS — Z9889 Other specified postprocedural states: Secondary | ICD-10-CM | POA: Diagnosis not present

## 2016-01-26 DIAGNOSIS — I4892 Unspecified atrial flutter: Secondary | ICD-10-CM

## 2016-01-26 DIAGNOSIS — I350 Nonrheumatic aortic (valve) stenosis: Secondary | ICD-10-CM

## 2016-01-26 DIAGNOSIS — I25110 Atherosclerotic heart disease of native coronary artery with unstable angina pectoris: Secondary | ICD-10-CM

## 2016-01-26 DIAGNOSIS — I2510 Atherosclerotic heart disease of native coronary artery without angina pectoris: Secondary | ICD-10-CM | POA: Diagnosis not present

## 2016-01-26 DIAGNOSIS — Z951 Presence of aortocoronary bypass graft: Secondary | ICD-10-CM

## 2016-01-26 DIAGNOSIS — I340 Nonrheumatic mitral (valve) insufficiency: Secondary | ICD-10-CM

## 2016-01-26 DIAGNOSIS — I200 Unstable angina: Secondary | ICD-10-CM | POA: Diagnosis not present

## 2016-01-26 NOTE — Progress Notes (Signed)
[address]       [place] [address]             [phone]     CARDIOTHORACIC SURGERY OFFICE NOTE  Referring Provider is Herminio Commons, MD PCP is Monico Blitz, MD   HPI:  Patient is a 75 year old obese white male with coronary artery disease, aortic stenosis, mitral regurgitation, atrial flutter status post ablation, hypertension, type 2 diabetes mellitus, hyperlipidemia, and a strong family history of coronary artery disease who returns to the office for routine follow-up today approximately 3 months status post coronary artery bypass grafting 4 and mitral valve repair on 10/15/2015 for severe three-vessel coronary artery disease with unstable angina pectoris and moderate to severe mitral regurgitation. Preoperative transthoracic echocardiograms suggested the presence of mild aortic stenosis, mild aortic insufficiency, and mild mitral regurgitation. However, intraoperative transesophageal echocardiogram revealed moderate to severe primary mitral regurgitation caused by calcification of the posterior mitral annulus.  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 Postoperatively he required permanent pacemaker placement for complete heart block.  The remainder of his postoperative recovery in the hospital was somewhat slow but overall uncomplicated.  He was ultimately discharged home on 10/24/2015.  Early in his course he was noted to have some atrial flutter underneath his pacemaker. At the time of his last 2 office visits at the cardiology offices he has remained in sinus rhythm, including last week when he was seen in follow-up by Dr. Lovena Le. He returns to our office today reporting that he feels well. He gets short of breath with exertion but only with more strenuous exertion, such as going up a flight of stairs. He notes that his exercise tolerance is  better than it was prior to surgery. Exertional shortness of breath does not limit his daily activities to any significant degree. Unfortunately, he quit the cardiac rehabilitation program after only 4 weeks. He currently is not exercising regularly at all. He has been doing better with his diet and he has lost 11 pounds since he was discharged from the hospital. Overall he feels well and has no complaints. He remains chronically anticoagulated using warfarin and low-dose aspirin. He has not had any bleeding complications.  Current Outpatient Prescriptions  Medication Sig Dispense Refill  . amLODipine (NORVASC) 10 MG tablet Take 1 tablet (10 mg total) by mouth daily. 30 tablet 6  . aspirin EC 81 MG tablet Take 1 tablet (81 mg total) by mouth daily. 90 tablet 3  . ASTAXANTHIN PO Take 10 mg by mouth daily. Reported on 10/30/2015    . atorvastatin (LIPITOR) 40 MG tablet Take 1 tablet by mouth daily. Reported on 11/24/2015    . carvedilol (COREG) 6.25 MG tablet Take 2 tablets (12.5 mg total) by mouth 2 (two) times daily. 360 tablet 3  . ferrous [id number] C-folic acid (TRINSICON / FOLTRIN) capsule Take 1 capsule by mouth 2 (two) times daily with a meal. 60 capsule 6  . furosemide (LASIX) 40 MG tablet TAKE ONE TABLET BY MOUTH ONCE DAILY 30 tablet 1  . lisinopril (PRINIVIL,ZESTRIL) 20 MG tablet Take 1 tablet (20 mg total) by mouth daily. 90 tablet 3  . metFORMIN (GLUCOPHAGE) 500 MG tablet Take 1,000 mg by mouth 2 (two) times daily with a meal. Reported on 10/30/2015  2  . potassium chloride (K-DUR) 10 MEQ tablet Take 1 tablet (10 mEq total) by mouth  daily. 90 tablet 3  . warfarin (COUMADIN) 5 MG tablet Take coumadin 1 1/2 tablets daily 60 tablet 3   No current facility-administered medications for this visit.       Physical Exam:   BP 115/70   Pulse 91   Resp 18   Ht 5\' 11"  (1.803 m)   Wt 234 lb (106.1 kg)   SpO2 96% Comment: ON RA  BMI 32.64 kg/m   General:  Obese but well  appearing  Chest:   Clear to auscultation  CV:   Regular rate and rhythm  Incisions:  Well-healed, sternum is stable  Abdomen:  Soft nontender  Extremities:  Warm and well-perfused  Diagnostic Tests:  n/a   Impression:  Patient is doing well approximately 3 months status post coronary artery bypass grafting and mitral valve repair.   Plan:  We have not recommended any changes to the patient's current medications at this time. Because the patient experienced atrial flutter only during the early postoperative setting it might be reasonable to consider stopping warfarin at some point in the future if he continues to maintain sinus rhythm. He no longer requires anticoagulation using warfarin because of his mitral valve. I have reminded the patient and his wife regarding the many benefits of regular exercise and maintenance of a heart healthy diet. The impact of strict glycemic control on his longevity has been emphasized. At this point in time the patient may resume unrestricted physical activity. At some point it would be wise to obtain a routine follow-up echocardiogram. We will defer this decision to Dr. Bronson Ing.  The patient will return to our office next June, approximately 1 year following his original surgery for routine follow-up.  I spent in excess of 15 minutes during the conduct of this office consultation and >50% of this time involved direct face-to-face encounter with the patient for counseling and/or coordination of their care.    Valentina Gu. Roxy Manns, MD 01/26/2016 11:39 AM

## 2016-01-26 NOTE — Patient Instructions (Addendum)
Continue all previous medications without any changes at this time  You may resume unrestricted physical activity without any particular limitations at this time.  Make every effort to stay physically active, get some type of exercise on a regular basis, and stick to a "heart healthy diet".  The long term benefits for regular exercise and a healthy diet are critically important to your overall health and wellbeing.  Make every effort to keep your diabetes under very tight control.  Follow up closely with your primary care physician or endocrinologist and strive to keep their hemoglobin A1c levels as low as possible, preferably near or below 6.0.  The long term benefits of strict control of diabetes are far reaching and critically important for your overall health and survival.

## 2016-01-28 ENCOUNTER — Encounter (HOSPITAL_COMMUNITY): Payer: Medicare Other

## 2016-01-28 DIAGNOSIS — I4891 Unspecified atrial fibrillation: Secondary | ICD-10-CM | POA: Diagnosis not present

## 2016-01-28 DIAGNOSIS — I10 Essential (primary) hypertension: Secondary | ICD-10-CM | POA: Diagnosis not present

## 2016-01-28 DIAGNOSIS — E7800 Pure hypercholesterolemia, unspecified: Secondary | ICD-10-CM | POA: Diagnosis not present

## 2016-01-29 ENCOUNTER — Ambulatory Visit (INDEPENDENT_AMBULATORY_CARE_PROVIDER_SITE_OTHER): Payer: Medicare Other | Admitting: *Deleted

## 2016-01-29 DIAGNOSIS — Z9889 Other specified postprocedural states: Secondary | ICD-10-CM | POA: Diagnosis not present

## 2016-01-29 DIAGNOSIS — Z5181 Encounter for therapeutic drug level monitoring: Secondary | ICD-10-CM | POA: Diagnosis not present

## 2016-01-29 DIAGNOSIS — I4892 Unspecified atrial flutter: Secondary | ICD-10-CM

## 2016-01-29 LAB — POCT INR: INR: 1.7

## 2016-01-30 ENCOUNTER — Encounter (HOSPITAL_COMMUNITY): Payer: Medicare Other

## 2016-02-02 ENCOUNTER — Encounter (HOSPITAL_COMMUNITY): Payer: Medicare Other

## 2016-02-04 ENCOUNTER — Encounter (HOSPITAL_COMMUNITY): Payer: Medicare Other

## 2016-02-04 DIAGNOSIS — Z23 Encounter for immunization: Secondary | ICD-10-CM | POA: Diagnosis not present

## 2016-02-06 ENCOUNTER — Encounter (HOSPITAL_COMMUNITY): Payer: Medicare Other

## 2016-02-09 ENCOUNTER — Encounter (HOSPITAL_COMMUNITY): Payer: Medicare Other

## 2016-02-10 DIAGNOSIS — Z713 Dietary counseling and surveillance: Secondary | ICD-10-CM | POA: Diagnosis not present

## 2016-02-10 DIAGNOSIS — Z299 Encounter for prophylactic measures, unspecified: Secondary | ICD-10-CM | POA: Diagnosis not present

## 2016-02-10 DIAGNOSIS — Z6833 Body mass index (BMI) 33.0-33.9, adult: Secondary | ICD-10-CM | POA: Diagnosis not present

## 2016-02-10 DIAGNOSIS — N390 Urinary tract infection, site not specified: Secondary | ICD-10-CM | POA: Diagnosis not present

## 2016-02-10 DIAGNOSIS — R350 Frequency of micturition: Secondary | ICD-10-CM | POA: Diagnosis not present

## 2016-02-11 ENCOUNTER — Encounter (HOSPITAL_COMMUNITY): Payer: Medicare Other

## 2016-02-12 ENCOUNTER — Ambulatory Visit (INDEPENDENT_AMBULATORY_CARE_PROVIDER_SITE_OTHER): Payer: Medicare Other | Admitting: *Deleted

## 2016-02-12 DIAGNOSIS — I4892 Unspecified atrial flutter: Secondary | ICD-10-CM | POA: Diagnosis not present

## 2016-02-12 DIAGNOSIS — Z9889 Other specified postprocedural states: Secondary | ICD-10-CM

## 2016-02-12 DIAGNOSIS — Z5181 Encounter for therapeutic drug level monitoring: Secondary | ICD-10-CM

## 2016-02-12 LAB — POCT INR: INR: 2.2

## 2016-02-13 ENCOUNTER — Encounter (HOSPITAL_COMMUNITY): Payer: Medicare Other

## 2016-02-13 ENCOUNTER — Other Ambulatory Visit: Payer: Self-pay | Admitting: Physician Assistant

## 2016-02-13 DIAGNOSIS — Z6833 Body mass index (BMI) 33.0-33.9, adult: Secondary | ICD-10-CM | POA: Diagnosis not present

## 2016-02-13 DIAGNOSIS — E1151 Type 2 diabetes mellitus with diabetic peripheral angiopathy without gangrene: Secondary | ICD-10-CM | POA: Diagnosis not present

## 2016-02-13 DIAGNOSIS — D649 Anemia, unspecified: Secondary | ICD-10-CM | POA: Diagnosis not present

## 2016-02-13 DIAGNOSIS — I10 Essential (primary) hypertension: Secondary | ICD-10-CM | POA: Diagnosis not present

## 2016-02-13 DIAGNOSIS — I2510 Atherosclerotic heart disease of native coronary artery without angina pectoris: Secondary | ICD-10-CM | POA: Diagnosis not present

## 2016-02-13 DIAGNOSIS — I4892 Unspecified atrial flutter: Secondary | ICD-10-CM | POA: Diagnosis not present

## 2016-02-16 ENCOUNTER — Encounter (HOSPITAL_COMMUNITY): Payer: Medicare Other

## 2016-02-18 ENCOUNTER — Encounter (HOSPITAL_COMMUNITY): Payer: Medicare Other

## 2016-02-19 ENCOUNTER — Ambulatory Visit (INDEPENDENT_AMBULATORY_CARE_PROVIDER_SITE_OTHER): Payer: Medicare Other | Admitting: *Deleted

## 2016-02-19 DIAGNOSIS — Z9889 Other specified postprocedural states: Secondary | ICD-10-CM | POA: Diagnosis not present

## 2016-02-19 DIAGNOSIS — Z5181 Encounter for therapeutic drug level monitoring: Secondary | ICD-10-CM

## 2016-02-19 DIAGNOSIS — I4892 Unspecified atrial flutter: Secondary | ICD-10-CM

## 2016-02-19 LAB — POCT INR: INR: 2.6

## 2016-02-20 ENCOUNTER — Encounter (HOSPITAL_COMMUNITY): Payer: Medicare Other

## 2016-02-26 ENCOUNTER — Ambulatory Visit (INDEPENDENT_AMBULATORY_CARE_PROVIDER_SITE_OTHER): Payer: Medicare Other | Admitting: *Deleted

## 2016-02-26 DIAGNOSIS — Z5181 Encounter for therapeutic drug level monitoring: Secondary | ICD-10-CM

## 2016-02-26 DIAGNOSIS — Z9889 Other specified postprocedural states: Secondary | ICD-10-CM | POA: Diagnosis not present

## 2016-02-26 DIAGNOSIS — I4892 Unspecified atrial flutter: Secondary | ICD-10-CM

## 2016-02-26 LAB — POCT INR: INR: 2.1

## 2016-03-01 ENCOUNTER — Telehealth: Payer: Self-pay | Admitting: Cardiovascular Disease

## 2016-03-01 DIAGNOSIS — E7800 Pure hypercholesterolemia, unspecified: Secondary | ICD-10-CM | POA: Diagnosis not present

## 2016-03-01 DIAGNOSIS — I10 Essential (primary) hypertension: Secondary | ICD-10-CM | POA: Diagnosis not present

## 2016-03-01 DIAGNOSIS — I4891 Unspecified atrial fibrillation: Secondary | ICD-10-CM | POA: Diagnosis not present

## 2016-03-01 NOTE — Telephone Encounter (Signed)
Patient has questions about Iron Pill.  Would not tell me more

## 2016-03-02 ENCOUNTER — Encounter: Payer: Self-pay | Admitting: *Deleted

## 2016-03-02 NOTE — Telephone Encounter (Signed)
We would defer any decisions regarding his home iron and blood counts to his primary doctor, this is not something that we as his cardiology providers would monitor or manage  Zandra Abts MD

## 2016-03-02 NOTE — Telephone Encounter (Signed)
Pt says recent labs by Dr. Manuella Ghazi that iron level were ok (says they were 200) and that he could d/c the trinsicon/foltrin that Dr Bronson Ing gave him - pt wanted to verify that this was ok with cardiologist - will request lab work and forward to Dr. Harl Bowie with Dr. Bronson Ing out of office.

## 2016-03-02 NOTE — Telephone Encounter (Signed)
Pt aware.

## 2016-03-18 ENCOUNTER — Inpatient Hospital Stay (HOSPITAL_COMMUNITY)
Admission: EM | Admit: 2016-03-18 | Discharge: 2016-03-20 | DRG: 292 | Disposition: A | Discharge status: Home / Self Care | Payer: Medicare Other | Attending: Internal Medicine | Admitting: Internal Medicine

## 2016-03-18 ENCOUNTER — Emergency Department (HOSPITAL_COMMUNITY): Payer: Medicare Other

## 2016-03-18 ENCOUNTER — Encounter (HOSPITAL_COMMUNITY): Payer: Self-pay | Admitting: *Deleted

## 2016-03-18 ENCOUNTER — Ambulatory Visit (INDEPENDENT_AMBULATORY_CARE_PROVIDER_SITE_OTHER): Payer: Medicare Other | Admitting: *Deleted

## 2016-03-18 DIAGNOSIS — Z87891 Personal history of nicotine dependence: Secondary | ICD-10-CM

## 2016-03-18 DIAGNOSIS — Z7982 Long term (current) use of aspirin: Secondary | ICD-10-CM

## 2016-03-18 DIAGNOSIS — Z5181 Encounter for therapeutic drug level monitoring: Secondary | ICD-10-CM | POA: Diagnosis not present

## 2016-03-18 DIAGNOSIS — I2510 Atherosclerotic heart disease of native coronary artery without angina pectoris: Secondary | ICD-10-CM | POA: Diagnosis present

## 2016-03-18 DIAGNOSIS — I110 Hypertensive heart disease with heart failure: Principal | ICD-10-CM | POA: Diagnosis present

## 2016-03-18 DIAGNOSIS — Z8249 Family history of ischemic heart disease and other diseases of the circulatory system: Secondary | ICD-10-CM

## 2016-03-18 DIAGNOSIS — I5033 Acute on chronic diastolic (congestive) heart failure: Secondary | ICD-10-CM | POA: Diagnosis present

## 2016-03-18 DIAGNOSIS — E785 Hyperlipidemia, unspecified: Secondary | ICD-10-CM | POA: Diagnosis present

## 2016-03-18 DIAGNOSIS — R0902 Hypoxemia: Secondary | ICD-10-CM | POA: Diagnosis not present

## 2016-03-18 DIAGNOSIS — E119 Type 2 diabetes mellitus without complications: Secondary | ICD-10-CM | POA: Diagnosis present

## 2016-03-18 DIAGNOSIS — J90 Pleural effusion, not elsewhere classified: Secondary | ICD-10-CM

## 2016-03-18 DIAGNOSIS — Z9889 Other specified postprocedural states: Secondary | ICD-10-CM

## 2016-03-18 DIAGNOSIS — Z7901 Long term (current) use of anticoagulants: Secondary | ICD-10-CM

## 2016-03-18 DIAGNOSIS — Z951 Presence of aortocoronary bypass graft: Secondary | ICD-10-CM

## 2016-03-18 DIAGNOSIS — R0600 Dyspnea, unspecified: Secondary | ICD-10-CM | POA: Diagnosis not present

## 2016-03-18 DIAGNOSIS — I5031 Acute diastolic (congestive) heart failure: Secondary | ICD-10-CM | POA: Diagnosis present

## 2016-03-18 DIAGNOSIS — J810 Acute pulmonary edema: Secondary | ICD-10-CM

## 2016-03-18 DIAGNOSIS — Z952 Presence of prosthetic heart valve: Secondary | ICD-10-CM

## 2016-03-18 DIAGNOSIS — Z7984 Long term (current) use of oral hypoglycemic drugs: Secondary | ICD-10-CM

## 2016-03-18 DIAGNOSIS — I10 Essential (primary) hypertension: Secondary | ICD-10-CM | POA: Diagnosis not present

## 2016-03-18 DIAGNOSIS — R0602 Shortness of breath: Secondary | ICD-10-CM | POA: Diagnosis not present

## 2016-03-18 DIAGNOSIS — Z950 Presence of cardiac pacemaker: Secondary | ICD-10-CM

## 2016-03-18 DIAGNOSIS — Z79899 Other long term (current) drug therapy: Secondary | ICD-10-CM

## 2016-03-18 DIAGNOSIS — I4892 Unspecified atrial flutter: Secondary | ICD-10-CM

## 2016-03-18 DIAGNOSIS — I350 Nonrheumatic aortic (valve) stenosis: Secondary | ICD-10-CM | POA: Diagnosis not present

## 2016-03-18 DIAGNOSIS — I252 Old myocardial infarction: Secondary | ICD-10-CM

## 2016-03-18 DIAGNOSIS — I214 Non-ST elevation (NSTEMI) myocardial infarction: Secondary | ICD-10-CM | POA: Diagnosis present

## 2016-03-18 LAB — COMPREHENSIVE METABOLIC PANEL
ALT: 15 U/L — ABNORMAL LOW (ref 17–63)
ANION GAP: 7 (ref 5–15)
AST: 16 U/L (ref 15–41)
Albumin: 4 g/dL (ref 3.5–5.0)
Alkaline Phosphatase: 62 U/L (ref 38–126)
BUN: 18 mg/dL (ref 6–20)
CHLORIDE: 105 mmol/L (ref 101–111)
CO2: 25 mmol/L (ref 22–32)
Calcium: 9.6 mg/dL (ref 8.9–10.3)
Creatinine, Ser: 0.68 mg/dL (ref 0.61–1.24)
GFR calc non Af Amer: >60 mL/min (ref >60)
Glucose, Bld: 123 mg/dL — ABNORMAL HIGH (ref 65–99)
Potassium: 4.6 mmol/L (ref 3.5–5.1)
SODIUM: 137 mmol/L (ref 135–145)
Total Bilirubin: 0.8 mg/dL (ref 0.3–1.2)
Total Protein: 7.4 g/dL (ref 6.5–8.1)

## 2016-03-18 LAB — CBC WITH DIFFERENTIAL/PLATELET
Basophils Absolute: 0.1 10*3/uL (ref 0.0–0.1)
Basophils Relative: 0 %
EOS ABS: 0.2 10*3/uL (ref 0.0–0.7)
Eosinophils Relative: 1 %
HCT: 38.4 % — ABNORMAL LOW (ref 39.0–52.0)
Hemoglobin: 12.9 g/dL — ABNORMAL LOW (ref 13.0–17.0)
LYMPHS ABS: 1.3 10*3/uL (ref 0.7–4.0)
Lymphocytes Relative: 9 %
MCH: 29.2 pg (ref 26.0–34.0)
MCHC: 33.6 g/dL (ref 30.0–36.0)
MCV: 86.9 fL (ref 78.0–100.0)
MONOS PCT: 8 %
Monocytes Absolute: 1.2 10*3/uL — ABNORMAL HIGH (ref 0.1–1.0)
Neutro Abs: 12.1 10*3/uL — ABNORMAL HIGH (ref 1.7–7.7)
Neutrophils Relative %: 82 %
PLATELETS: 296 10*3/uL (ref 150–400)
RBC: 4.42 MIL/uL (ref 4.22–5.81)
RDW: 16.8 % — ABNORMAL HIGH (ref 11.5–15.5)
WBC: 14.7 10*3/uL — AB (ref 4.0–10.5)

## 2016-03-18 LAB — POCT INR: INR: 2.7

## 2016-03-18 LAB — BRAIN NATRIURETIC PEPTIDE: B Natriuretic Peptide: 252 pg/mL — ABNORMAL HIGH (ref 0.0–100.0)

## 2016-03-18 MED ORDER — FUROSEMIDE 10 MG/ML IJ SOLN
80.0000 mg | Freq: Once | INTRAMUSCULAR | Status: AC
  Administered 2016-03-19: 80 mg via INTRAVENOUS
  Filled 2016-03-18: qty 8

## 2016-03-18 NOTE — ED Triage Notes (Addendum)
Pt sob with exertion x 1 month. Pt also reports an intermittent cough. Pt states he feels like he is smothering when he walks. Pt states he sees Dr. Bronson Ing next week. Pt denies any other symptoms.

## 2016-03-19 ENCOUNTER — Encounter (HOSPITAL_COMMUNITY): Payer: Self-pay

## 2016-03-19 DIAGNOSIS — I5031 Acute diastolic (congestive) heart failure: Secondary | ICD-10-CM

## 2016-03-19 DIAGNOSIS — I252 Old myocardial infarction: Secondary | ICD-10-CM | POA: Diagnosis not present

## 2016-03-19 DIAGNOSIS — Z7984 Long term (current) use of oral hypoglycemic drugs: Secondary | ICD-10-CM | POA: Diagnosis not present

## 2016-03-19 DIAGNOSIS — I5033 Acute on chronic diastolic (congestive) heart failure: Secondary | ICD-10-CM | POA: Diagnosis present

## 2016-03-19 DIAGNOSIS — Z7901 Long term (current) use of anticoagulants: Secondary | ICD-10-CM | POA: Diagnosis not present

## 2016-03-19 DIAGNOSIS — E119 Type 2 diabetes mellitus without complications: Secondary | ICD-10-CM | POA: Diagnosis present

## 2016-03-19 DIAGNOSIS — I214 Non-ST elevation (NSTEMI) myocardial infarction: Secondary | ICD-10-CM | POA: Diagnosis not present

## 2016-03-19 DIAGNOSIS — E785 Hyperlipidemia, unspecified: Secondary | ICD-10-CM | POA: Diagnosis present

## 2016-03-19 DIAGNOSIS — Z7982 Long term (current) use of aspirin: Secondary | ICD-10-CM | POA: Diagnosis not present

## 2016-03-19 DIAGNOSIS — Z952 Presence of prosthetic heart valve: Secondary | ICD-10-CM | POA: Diagnosis not present

## 2016-03-19 DIAGNOSIS — Z950 Presence of cardiac pacemaker: Secondary | ICD-10-CM | POA: Diagnosis not present

## 2016-03-19 DIAGNOSIS — Z79899 Other long term (current) drug therapy: Secondary | ICD-10-CM | POA: Diagnosis not present

## 2016-03-19 DIAGNOSIS — I110 Hypertensive heart disease with heart failure: Secondary | ICD-10-CM | POA: Diagnosis present

## 2016-03-19 DIAGNOSIS — Z8249 Family history of ischemic heart disease and other diseases of the circulatory system: Secondary | ICD-10-CM | POA: Diagnosis not present

## 2016-03-19 DIAGNOSIS — I350 Nonrheumatic aortic (valve) stenosis: Secondary | ICD-10-CM | POA: Diagnosis present

## 2016-03-19 DIAGNOSIS — Z951 Presence of aortocoronary bypass graft: Secondary | ICD-10-CM | POA: Diagnosis not present

## 2016-03-19 DIAGNOSIS — Z87891 Personal history of nicotine dependence: Secondary | ICD-10-CM | POA: Diagnosis not present

## 2016-03-19 DIAGNOSIS — R0600 Dyspnea, unspecified: Secondary | ICD-10-CM | POA: Diagnosis not present

## 2016-03-19 DIAGNOSIS — I2510 Atherosclerotic heart disease of native coronary artery without angina pectoris: Secondary | ICD-10-CM | POA: Diagnosis present

## 2016-03-19 DIAGNOSIS — I4892 Unspecified atrial flutter: Secondary | ICD-10-CM | POA: Diagnosis present

## 2016-03-19 DIAGNOSIS — R0902 Hypoxemia: Secondary | ICD-10-CM | POA: Diagnosis present

## 2016-03-19 DIAGNOSIS — Z9889 Other specified postprocedural states: Secondary | ICD-10-CM | POA: Diagnosis not present

## 2016-03-19 LAB — BASIC METABOLIC PANEL
ANION GAP: 8 (ref 5–15)
BUN: 16 mg/dL (ref 6–20)
CALCIUM: 9.4 mg/dL (ref 8.9–10.3)
CHLORIDE: 102 mmol/L (ref 101–111)
CO2: 27 mmol/L (ref 22–32)
Creatinine, Ser: 0.7 mg/dL (ref 0.61–1.24)
GFR calc non Af Amer: >60 mL/min (ref >60)
GLUCOSE: 128 mg/dL — AB (ref 65–99)
POTASSIUM: 3.7 mmol/L (ref 3.5–5.1)
Sodium: 137 mmol/L (ref 135–145)

## 2016-03-19 LAB — I-STAT TROPONIN, ED: Troponin i, poc: 0.01 ng/mL (ref 0.00–0.08)

## 2016-03-19 LAB — PROTIME-INR
INR: 2.56
PROTHROMBIN TIME: 28 s — AB (ref 11.4–15.2)

## 2016-03-19 LAB — GLUCOSE, CAPILLARY
GLUCOSE-CAPILLARY: 96 mg/dL (ref 65–99)
GLUCOSE-CAPILLARY: 114 mg/dL — AB (ref 65–99)
GLUCOSE-CAPILLARY: 94 mg/dL (ref 65–99)

## 2016-03-19 LAB — MRSA PCR SCREENING: MRSA by PCR: NEGATIVE

## 2016-03-19 LAB — TSH: TSH: 4.924 u[IU]/mL — ABNORMAL HIGH (ref 0.350–4.500)

## 2016-03-19 MED ORDER — INSULIN ASPART 100 UNIT/ML ~~LOC~~ SOLN: 0.0000 [IU] | Freq: Every day | SUBCUTANEOUS | Status: DC

## 2016-03-19 MED ORDER — METFORMIN HCL 500 MG PO TABS
1000.0000 mg | ORAL_TABLET | Freq: Two times a day (BID) | ORAL | Status: DC
  Administered 2016-03-19 – 2016-03-20 (×3): 1000 mg via ORAL
  Filled 2016-03-19 (×3): qty 2

## 2016-03-19 MED ORDER — LISINOPRIL 10 MG PO TABS
20.0000 mg | ORAL_TABLET | Freq: Every day | ORAL | Status: DC
  Administered 2016-03-19 – 2016-03-20 (×2): 20 mg via ORAL
  Filled 2016-03-19 (×2): qty 2

## 2016-03-19 MED ORDER — WARFARIN - PHARMACIST DOSING INPATIENT: Freq: Every day | Status: DC

## 2016-03-19 MED ORDER — ACETAMINOPHEN 650 MG RE SUPP
650.0000 mg | Freq: Four times a day (QID) | RECTAL | Status: DC | PRN
Start: 2016-03-19 — End: 2016-03-20

## 2016-03-19 MED ORDER — CARVEDILOL 12.5 MG PO TABS
12.5000 mg | ORAL_TABLET | Freq: Two times a day (BID) | ORAL | Status: DC
Start: 2016-03-19 — End: 2016-03-20
  Administered 2016-03-19 – 2016-03-20 (×3): 12.5 mg via ORAL
  Filled 2016-03-19 (×3): qty 1

## 2016-03-19 MED ORDER — INSULIN ASPART 100 UNIT/ML ~~LOC~~ SOLN
0.0000 [IU] | Freq: Three times a day (TID) | SUBCUTANEOUS | Status: DC
  Administered 2016-03-20: 3 [IU] via SUBCUTANEOUS

## 2016-03-19 MED ORDER — AMLODIPINE BESYLATE 5 MG PO TABS
10.0000 mg | ORAL_TABLET | Freq: Every day | ORAL | Status: DC
  Administered 2016-03-19 – 2016-03-20 (×2): 10 mg via ORAL
  Filled 2016-03-19 (×2): qty 2

## 2016-03-19 MED ORDER — FUROSEMIDE 10 MG/ML IJ SOLN
40.0000 mg | Freq: Two times a day (BID) | INTRAMUSCULAR | Status: DC
  Administered 2016-03-20: 40 mg via INTRAVENOUS
  Filled 2016-03-19: qty 4

## 2016-03-19 MED ORDER — NITROGLYCERIN 2 % TD OINT
1.0000 [in_us] | TOPICAL_OINTMENT | Freq: Once | TRANSDERMAL | Status: AC
  Administered 2016-03-19: 1 [in_us] via TOPICAL
  Filled 2016-03-19: qty 1

## 2016-03-19 MED ORDER — ASPIRIN EC 81 MG PO TBEC
81.0000 mg | DELAYED_RELEASE_TABLET | Freq: Every day | ORAL | Status: DC
  Administered 2016-03-19 – 2016-03-20 (×2): 81 mg via ORAL
  Filled 2016-03-19 (×2): qty 1

## 2016-03-19 MED ORDER — POTASSIUM CHLORIDE CRYS ER 10 MEQ PO TBCR
10.0000 meq | EXTENDED_RELEASE_TABLET | Freq: Every day | ORAL | Status: DC
  Administered 2016-03-19 – 2016-03-20 (×2): 10 meq via ORAL
  Filled 2016-03-19 (×6): qty 1

## 2016-03-19 MED ORDER — WARFARIN SODIUM 7.5 MG PO TABS
7.5000 mg | ORAL_TABLET | Freq: Once | ORAL | Status: AC
  Administered 2016-03-19: 7.5 mg via ORAL
  Filled 2016-03-19: qty 1

## 2016-03-19 MED ORDER — ACETAMINOPHEN 325 MG PO TABS: 650.0000 mg | ORAL_TABLET | Freq: Four times a day (QID) | ORAL | Status: DC | PRN

## 2016-03-19 MED ORDER — ATORVASTATIN CALCIUM 40 MG PO TABS
40.0000 mg | ORAL_TABLET | Freq: Every day | ORAL | Status: DC
  Administered 2016-03-19 – 2016-03-20 (×2): 40 mg via ORAL
  Filled 2016-03-19 (×2): qty 1

## 2016-03-19 NOTE — Progress Notes (Signed)
PROGRESS NOTE    Christian Rasmussen  [id number] DOB: [birth year] DOA: 03/18/2016 PCP: Monico Blitz, MD    Brief Narrative:  75 y.o. male with hx of CAD, s/p CABGx4, aortic stenosis, MR, aflutter s/p ablation, complete HB post op, s/p ppm, mitral valve repair, on chronic anticoagulation, having DOE since surgery, but more so tonight, so he presented to the ER.  He has no fever, chills, nausea, vomiting or CP.  Evaluation in the ER showed EKG with wide complex atrial paced rhythm, INR of 2.7, WBC 14.7 Hb of 12.9 and BNP of 252.  His CXR showed instertial PNA and bilateral pleural effusion, and he was given 80mg  of IV lasix x 1 (his home lasix dose was 40mg  per day), with good diuresis, feeling better.  His Sat however, dipped down to 88 percent with tachycardia on ambulation, and therefore, hospitalist was asked to admit him for likely acute diastolic CHF for further diuresis  Assessment & Plan:   Principal Problem:   Acute diastolic CHF (congestive heart failure) (HCC) Active Problems:   Atrial flutter (HCC)   NSTEMI (non-ST elevated myocardial infarction) ([facility])   S/P CABG x 4   S/P mitral valve repair + CABG x4   1. Acutely decompensated diastolic CHF 1. Most recent 2d echo from 10/14/15 with normal LVEF and grade 2 diastolic failure 2. Patient presented with interstitial edema and small B pleural effusions 3. Clinical improvement with lasix. Will continue to diurese as tolerated 4. Monitor I/o 5. Patient is near baseline dry weight (106kg dry), reported 105kg today 2. Aflutter 1. Rate controlled 2. Patient is s/p pacemaker 3. Continue coumadin, pharmacy to dose 3. CAD 1. Stable 2. Denies chest pain 4. S/p MVR 1. Continue coumadin for now 2. Stable 5. DM 1. Patient on metformin prior to admit 2. Will add sliding scale coverage 3. Glucose reviewed, stable thus far 6. HTN 1. BP stable and controlled, reviewed 2. Cont current regimen 7. HLD 1. Continue atorvastatin as  tolerated  DVT prophylaxis: Coumadin Code Status: Full Family Communication: Patient in room Disposition Plan: Possible d/c home in 24-48hrs  Consultants:     Procedures:     Antimicrobials: Anti-infectives    None      Subjective: Feels much better today  Objective: Vitals:   03/19/16 0900 03/19/16 0940 03/19/16 1000 03/19/16 1230  BP: 109/64  106/64   Pulse: (!) 101  98   Resp: (!) 25  (!) 27   Temp:  98 F (36.7 C)  97.9 F (36.6 C)  TempSrc:  Oral  Oral  SpO2: 94%  94%   Weight:      Height:        Intake/Output Summary (Last 24 hours) at 03/19/16 1254 Last data filed at 03/19/16 1230  Gross per 24 hour  Intake              480 ml  Output              950 ml  Net             -470 ml   Filed Weights   03/18/16 2042 03/19/16 0232 03/19/16 0500  Weight: 106.1 kg (234 lb) 105 kg (231 lb 7.7 oz) 105 kg (231 lb 7.7 oz)    Examination:  General exam: Appears calm and comfortable  Respiratory system: Clear to auscultation. Respiratory effort normal. Cardiovascular system: S1 & S2 heard, RRR.  Gastrointestinal system: Abdomen is nondistended, soft and nontender. No organomegaly or masses  felt. Normal bowel sounds heard. Central nervous system: Alert and oriented. No focal neurological deficits. Extremities: Symmetric 5 x 5 power. Skin: No rashes, lesions or ulcers Psychiatry: Judgement and insight appear normal. Mood & affect appropriate.   Data Reviewed: I have personally reviewed following labs and imaging studies  CBC:  Recent Labs Lab 03/18/16 2118  WBC 14.7*  NEUTROABS 12.1*  HGB 12.9*  HCT 38.4*  MCV 86.9  PLT 0000000   Basic Metabolic Panel:  Recent Labs Lab 03/18/16 2118 03/19/16 0521  NA 137 137  K 4.6 3.7  CL 105 102  CO2 25 27  GLUCOSE 123* 128*  BUN 18 16  CREATININE 0.68 0.70  CALCIUM 9.6 9.4   GFR: Estimated Creatinine Clearance: 101.4 mL/min (by C-G formula based on SCr of 0.7 mg/dL). Liver Function Tests:  Recent  Labs Lab 03/18/16 2118  AST 16  ALT 15*  ALKPHOS 62  BILITOT 0.8  PROT 7.4  ALBUMIN 4.0   No results for input(s): LIPASE, AMYLASE in the last 168 hours. No results for input(s): AMMONIA in the last 168 hours. Coagulation Profile:  Recent Labs Lab 03/18/16 0726 03/19/16 0849  INR 2.7 2.56   Cardiac Enzymes: No results for input(s): CKTOTAL, CKMB, CKMBINDEX, TROPONINI in the last 168 hours. BNP (last 3 results) No results for input(s): PROBNP in the last 8760 hours. HbA1C: No results for input(s): HGBA1C in the last 72 hours. CBG:  Recent Labs Lab 03/19/16 1204  GLUCAP 96   Lipid Profile: No results for input(s): CHOL, HDL, LDLCALC, TRIG, CHOLHDL, LDLDIRECT in the last 72 hours. Thyroid Function Tests:  Recent Labs  03/18/16 2118  TSH 4.924*   Anemia Panel: No results for input(s): VITAMINB12, FOLATE, FERRITIN, TIBC, IRON, RETICCTPCT in the last 72 hours. Sepsis Labs: No results for input(s): PROCALCITON, LATICACIDVEN in the last 168 hours.  Recent Results (from the past 240 hour(s))  MRSA PCR Screening     Status: None   Collection Time: 03/19/16  2:20 AM  Result Value Ref Range Status   MRSA by PCR NEGATIVE NEGATIVE Final    Comment:        The GeneXpert MRSA Assay (FDA approved for NASAL specimens only), is one component of a comprehensive MRSA colonization surveillance program. It is not intended to diagnose MRSA infection nor to guide or monitor treatment for MRSA infections.      Radiology Studies: Dg Chest 2 View  Result Date: 03/18/2016 CLINICAL DATA:  Dyspnea for several months. History of diabetes, hypertension, coronary artery disease and CABG. EXAM: CHEST  2 VIEW COMPARISON:  11/24/2015 FINDINGS: The heart is top-normal in size. The patient is status post CABG. Small bilateral pleural effusions blunting the lateral costophrenic angles are identified with mild interstitial edema. No pneumonic consolidation. Aortic atherosclerosis is  visualized and left-sided pacemaker apparatus with right atrial and right ventricular leads are seen. No suspicious osseous abnormalities. IMPRESSION: Mild interstitial edema with small bilateral pleural effusions. Status post CABG with right atrial and right ventricular pacing leads noted with left-sided pacemaker apparatus. Electronically Signed   By: Ashley Royalty M.D.   On: 03/18/2016 21:21    Scheduled Meds: . amLODipine  10 mg Oral Daily  . aspirin EC  81 mg Oral Daily  . atorvastatin  40 mg Oral Daily  . carvedilol  12.5 mg Oral BID  . [START ON 03/20/2016] furosemide  40 mg Intravenous BID  . insulin aspart  0-15 Units Subcutaneous TID WC  . insulin aspart  0-5 Units Subcutaneous QHS  . lisinopril  20 mg Oral Daily  . metFORMIN  1,000 mg Oral BID WC  . potassium chloride  10 mEq Oral Daily  . warfarin  7.5 mg Oral Once  . Warfarin - Pharmacist Dosing Inpatient   Does not apply q1800   Continuous Infusions:   LOS: 0 days   Christian Rasmussen, Orpah Melter, MD [facility] Pager [phone]  If 7PM-7AM, please contact night-coverage [email or web address] Password [email or web address] 03/19/2016, 12:54 PM

## 2016-03-19 NOTE — H&P (Signed)
History and Physical    SEAVER FOSSETT [id number] DOB: 09-18-1940 DOA: 03/18/2016  PCP: Monico Blitz, MD  Patient coming from: Home.    Chief Complaint:  DOE.  HPI: Christian Rasmussen is an 75 y.o. male with hx of CAD, s/p CABGx4, aortic stenosis, MR, aflutter s/p ablation, complete HB post op, s/p ppm, mitral valve repair, on chronic anticoagulation, having DOE since surgery, but more so tonight, so he presented to the ER.  He has no fever, chills, nausea, vomiting or CP.  Evaluation in the ER showed EKG with wide complex atrial paced rhythm, INR of 2.7, WBC 14.7 Hb of 12.9 and BNP of 252.  His CXR showed instertial PNA and bilateral pleural effusion, and he was given 80mg  of IV lasix x 1 (his home lasix dose was 40mg  per day), with good diuresis, feeling better.  His Sat however, dipped down to 88 percent with tachycardia on ambulation, and therefore, hospitalist was asked to admit him for likely acute diastolic CHF for further diuresis.   ED Course:  See above.  Rewiew of Systems:  Constitutional: Negative for malaise, fever and chills. No significant weight loss or weight gain Eyes: Negative for eye pain, redness and discharge, diplopia, visual changes, or flashes of light. ENMT: Negative for ear pain, hoarseness, nasal congestion, sinus pressure and sore throat. No headaches; tinnitus, drooling, or problem swallowing. Cardiovascular: Negative for chest pain, palpitations, diaphoresis,  No orthopnea, PND Respiratory: Negative for cough, hemoptysis, wheezing and stridor. No pleuritic chestpain. Gastrointestinal: Negative for diarrhea, constipation,  melena, blood in stool, hematemesis, jaundice and rectal bleeding.    Genitourinary: Negative for frequency, dysuria, incontinence,flank pain and hematuria; Musculoskeletal: Negative for back pain and neck pain. Negative for swelling and trauma.;  Skin: . Negative for pruritus, rash, abrasions, bruising and skin lesion.; ulcerations Neuro: Negative  for headache, lightheadedness and neck stiffness. Negative for weakness, altered level of consciousness , altered mental status, extremity weakness, burning feet, involuntary movement, seizure and syncope.  Psych: negative for anxiety, depression, insomnia, tearfulness, panic attacks, hallucinations, paranoia, suicidal or homicidal ideation    Past Medical History:  Diagnosis Date  . 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 Aortic stenosis 10/14/2015  . CAD (coronary artery disease)    status post Taxus stent patency of RCA 2004, Cardiolite negative for ischemia in 2010.  . Diabetes mellitus ([place])   . Dyslipidemia   . History of kidney stones   . Hypertension   . Mitral regurgitation 10/15/2015   Moderate-severe by intra-operative TEE  . Overweight   . Right bundle branch block (RBBB) with left anterior hemiblock   . 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 Snores   . Typical atrial flutter [facility])    s/p ablation 11-23-2012 by Dr Rayann Heman   Past Surgical History:  Procedure Laterality Date  . ABLATION  11-23-2012   s/p cavotricuspid isthmus ablation by Dr Rayann Heman  . ANKLE SURGERY Left    total ankle replacement  . ATRIAL FLUTTER ABLATION N/A 11/23/2012   Procedure: ATRIAL FLUTTER ABLATION;  Surgeon: Thompson Grayer, MD;  Location: [facility] CATH LAB;  Service: Cardiovascular;  Laterality: N/A;  . CARDIAC CATHETERIZATION N/A 10/13/2015  Procedure: Left Heart Cath and Coronary Angiography;  Surgeon: Kaelan Amble M Martinique, MD;  Location: [place] CV LAB;  Service: Cardiovascular;  Laterality: N/A;  . CATARACT EXTRACTION Right   . CATARACT EXTRACTION W/PHACO Left 09/13/2013    Procedure: CATARACT EXTRACTION PHACO AND INTRAOCULAR LENS PLACEMENT (IOC);  Surgeon: Tonny Branch, MD;  Location: [facility] ORS;  Service: Ophthalmology;  Laterality: Left;  CDE 9.38  . COLONOSCOPY N/A 10/10/2014   Procedure: COLONOSCOPY;  Surgeon: Rogene Houston, MD;  Location: [facility] ENDO SUITE;  Service: Endoscopy;  Laterality: N/A;  830  . CORONARY ANGIOPLASTY     3 stents  . CORONARY ARTERY BYPASS GRAFT N/A 10/15/2015   Procedure: CORONARY ARTERY BYPASS GRAFTING (CABG) X4 LIMA-LAD; SEQ SVG-PD-PL; SVG-OM1 ENDOSCOPIC GREATER SAPHENOUS VEIN HARVEST(EVH) BILAT LOWER EXTREM;  Surgeon: Rexene Alberts, MD;  Location: [facility];  Service: Open Heart Surgery;  Laterality: N/A;  . EP IMPLANTABLE DEVICE N/A 10/20/2015   Procedure: Pacemaker Implant;  Surgeon: Evans Lance, MD;  Location: [place] CV LAB;  Service: Cardiovascular;  Laterality: N/A;  . MITRAL VALVE REPAIR N/A 10/15/2015   Procedure: MITRAL VALVE REPAIR (MVR), # 28 MEMO 3-D RING ANNULOPLASTY AND COMPLEX VALVE REPAIR;  Surgeon: Rexene Alberts, MD;  Location: [place];  Service: Open Heart Surgery;  Laterality: N/A;  . TEE WITHOUT CARDIOVERSION N/A 10/15/2015   Procedure: TRANSESOPHAGEAL ECHOCARDIOGRAM (TEE);  Surgeon: Rexene Alberts, MD;  Location: [place];  Service: Open Heart Surgery;  Laterality: N/A;     reports that he quit smoking about 30 years ago. His smoking use included Cigarettes. He started smoking about 59 years ago. He has a 28.00 pack-year smoking history. He has never used smokeless tobacco. He reports that he drinks alcohol. He reports that he does not use drugs.  No Known Allergies  Family History  Problem Relation Age of Onset  . Other Father 54    Died from MI.  . Other Mother     alive & well  . Leukemia Brother 52    died     Prior to Admission medications   Medication Sig Start Date End Date Taking? Authorizing Provider  amLODipine (NORVASC) 10 MG tablet Take 1 tablet (10 mg total) by mouth daily. 12/18/15   Herminio Commons, MD  aspirin EC 81 MG tablet Take 1 tablet (81 mg total) by mouth daily. 01/04/13   Thompson Grayer, MD  ASTAXANTHIN PO Take 10 mg by mouth daily. Reported on 10/30/2015    Historical Provider, MD  atorvastatin (LIPITOR) 40 MG tablet Take 1 tablet by mouth daily. Reported on 11/24/2015 02/19/13   Historical Provider, MD  carvedilol (COREG) 6.25 MG tablet Take 2 tablets (12.5 mg total) by mouth 2 (two) times daily. 12/04/15   Herminio Commons, MD  ferrous Q000111Q C-folic acid (TRINSICON / FOLTRIN) capsule Take 1 capsule by mouth 2 (two) times daily with a meal. 12/18/15   Herminio Commons, MD  furosemide (LASIX) 40 MG tablet TAKE ONE TABLET BY MOUTH ONCE DAILY 02/13/16   Herminio Commons, MD  lisinopril (PRINIVIL,ZESTRIL) 20 MG tablet Take 1 tablet (20 mg total) by mouth daily. 03/12/11   Ezra Sites, MD  metFORMIN (GLUCOPHAGE) 500 MG tablet Take 1,000 mg by mouth 2 (two) times daily with a meal. Reported on 10/30/2015 08/01/14   Historical Provider, MD  potassium chloride (K-DUR) 10 MEQ tablet Take 1 tablet (10 mEq total) by mouth daily. 01/23/16   Herminio Commons, MD  warfarin (COUMADIN) 5 MG tablet Take coumadin 1 1/2 tablets daily 01/07/16   Herminio Commons, MD    Physical Exam: Vitals:   03/18/16 2042 03/18/16 2211 03/19/16 0031 03/19/16 0058  BP: 146/80 135/76 140/100 129/88  Pulse: 91 95 103 101  Resp: 20 20 (!) 28 20  Temp: 98 F (36.7 C)     TempSrc: Temporal     SpO2: 94% 95% 94% 97%  Weight: 106.1 kg (234 lb)     Height: 5\' 7"  (1.702 m)         Constitutional: NAD, calm, comfortable Vitals:   03/18/16 2042 03/18/16 2211 03/19/16 0031 03/19/16 0058  BP: 146/80 135/76 140/100 129/88  Pulse: 91 95 103 101  Resp: 20 20 (!) 28 20  Temp: 98 F (36.7 C)     TempSrc: Temporal     SpO2: 94% 95% 94% 97%  Weight: 106.1 kg (234 lb)     Height: 5\' 7"  (1.702 m)      Eyes: PERRL, lids and conjunctivae normal ENMT: Mucous membranes are moist.  Posterior pharynx clear of any exudate or lesions.Normal dentition.  Neck: normal, supple, no masses, no thyromegaly Respiratory: no wheezing, bilateral crackles. Normal respiratory effort. No accessory muscle use.  Cardiovascular: Regular rate and rhythm, no murmurs / rubs / gallops. No extremity edema. 2+ pedal pulses. No carotid bruits.  Abdomen: no tenderness, no masses palpated. No hepatosplenomegaly. Bowel sounds positive.  Musculoskeletal: no clubbing / cyanosis. No joint deformity upper and lower extremities. Good ROM, no contractures. Normal muscle tone.  Skin: no rashes, lesions, ulcers. No induration Neurologic: CN 2-12 grossly intact. Sensation intact, DTR normal. Strength 5/5 in all 4.  Psychiatric: Normal judgment and insight. Alert and oriented x 3. Normal mood.     Labs on Admission: I have personally reviewed following labs and imaging studies  CBC:  Recent Labs Lab 03/18/16 2118  WBC 14.7*  NEUTROABS 12.1*  HGB 12.9*  HCT 38.4*  MCV 86.9  PLT 0000000   Basic Metabolic Panel:  Recent Labs Lab 03/18/16 2118  NA 137  K 4.6  CL 105  CO2 25  GLUCOSE 123*  BUN 18  CREATININE 0.68  CALCIUM 9.6   GFR: Estimated Creatinine Clearance: 95.5 mL/min (by C-G formula based on SCr of 0.68 mg/dL). Liver Function Tests:  Recent Labs Lab 03/18/16 2118  AST 16  ALT 15*  ALKPHOS 62  BILITOT 0.8  PROT 7.4  ALBUMIN 4.0   Coagulation Profile:  Recent Labs Lab 03/18/16 0726  INR 2.7   Urine analysis:    Component Value Date/Time   COLORURINE YELLOW 10/22/2015 0854   APPEARANCEUR CLEAR 10/22/2015 0854   LABSPEC 1.022 10/22/2015 0854   PHURINE 7.0 10/22/2015 0854   GLUCOSEU NEGATIVE 10/22/2015 0854   HGBUR NEGATIVE 10/22/2015 0854   [place] NEGATIVE 10/22/2015 0854   Industry 10/22/2015 0854   PROTEINUR NEGATIVE 10/22/2015 0854   NITRITE NEGATIVE 10/22/2015 0854   LEUKOCYTESUR NEGATIVE 10/22/2015 0854      Radiological Exams on  Admission: Dg Chest 2 View  Result Date: 03/18/2016 CLINICAL DATA:  Dyspnea for several months. History of diabetes, hypertension, coronary artery disease and CABG. EXAM: CHEST  2 VIEW COMPARISON:  11/24/2015 FINDINGS: The heart is top-normal in size. The patient is status post CABG. Small bilateral pleural effusions blunting the lateral costophrenic angles are identified with mild interstitial edema. No pneumonic consolidation. Aortic atherosclerosis is visualized and left-sided pacemaker apparatus with right atrial and right ventricular leads are  seen. No suspicious osseous abnormalities. IMPRESSION: Mild interstitial edema with small bilateral pleural effusions. Status post CABG with right atrial and right ventricular pacing leads noted with left-sided pacemaker apparatus. Electronically Signed   By: Ashley Royalty M.D.   On: 03/18/2016 21:21    EKG: Independently reviewed.   Assessment/Plan Principal Problem:   Acute diastolic CHF (congestive heart failure) (HCC) Active Problems:   Atrial flutter (HCC)   NSTEMI (non-ST elevated myocardial infarction) (HCC)   S/P CABG x 4   S/P mitral valve repair + CABG x4    PLAN:   Acute diastolic CHF:  I suspect he has volume accumulation.  Will give Lasix 40mg  IV BID.  Follow I/O and daily weight.  Repeat ECHO as planned.  Will continue with his other cardiac meds.  Aflutter:  Rate controlled, stable with prior ablation, now s/p ppm. Will continue with Coumadin per pharmacy dosing.   CAD, s/p CABG:  Stable.   S/P MVR:  Stable.     DVT prophylaxis: Coumadin.  Code Status: FULL CODE.  Family Communication: None at bedside.  Disposition Plan: To home when appropriate.  Consults called: None.  Admission status: Observation.    Jolita Haefner MD FACP. [facility]  If 7PM-7AM, please contact night-coverage [email or web address] Password [email or web address]  03/19/2016, 1:30 AM

## 2016-03-19 NOTE — Progress Notes (Signed)
ANTICOAGULATION CONSULT NOTE   Pharmacy Consult for coumadin Indication: atrial fibrillation  No Known Allergies  Patient Measurements: Height: 5\' 11"  (180.3 cm) Weight: 231 lb 7.7 oz (105 kg) IBW/kg (Calculated) : 75.3   Vital Signs: Temp: 98 F (36.7 C) (11/10 0940) Temp Source: Oral (11/10 0940) BP: 106/64 (11/10 1000) Pulse Rate: 98 (11/10 1000)  Labs:  Recent Labs  03/18/16 0726 03/18/16 2118 03/19/16 0521 03/19/16 0849  HGB  --  12.9*  --   --   HCT  --  38.4*  --   --   PLT  --  296  --   --   LABPROT  --   --   --  28.0*  INR 2.7  --   --  2.56  CREATININE  --  0.68 0.70  --     Estimated Creatinine Clearance: 101.4 mL/min (by C-G formula based on SCr of 0.7 mg/dL).   Medications:  Prescriptions Prior to Admission  Medication Sig Dispense Refill Last Dose  . amLODipine (NORVASC) 10 MG tablet Take 1 tablet (10 mg total) by mouth daily. 30 tablet 6 03/18/2016 at Unknown time  . aspirin EC 81 MG tablet Take 1 tablet (81 mg total) by mouth daily. 90 tablet 3 03/18/2016 at Unknown time  . ASTAXANTHIN PO Take 10 mg by mouth daily. Reported on 10/30/2015   03/18/2016 at Unknown time  . atorvastatin (LIPITOR) 40 MG tablet Take 1 tablet by mouth daily. Reported on 11/24/2015   03/18/2016 at Unknown time  . carvedilol (COREG) 6.25 MG tablet Take 2 tablets (12.5 mg total) by mouth 2 (two) times daily. 360 tablet 3 03/18/2016 at 1800  . furosemide (LASIX) 40 MG tablet TAKE ONE TABLET BY MOUTH ONCE DAILY 30 tablet 6 03/18/2016 at Unknown time  . lisinopril (PRINIVIL,ZESTRIL) 20 MG tablet Take 1 tablet (20 mg total) by mouth daily. 90 tablet 3 03/18/2016 at Unknown time  . metFORMIN (GLUCOPHAGE) 500 MG tablet Take 1,000 mg by mouth 2 (two) times daily with a meal. Reported on 10/30/2015  2 03/18/2016 at Unknown time  . potassium chloride (K-DUR) 10 MEQ tablet Take 1 tablet (10 mEq total) by mouth daily. 90 tablet 3 03/18/2016 at Unknown time  . warfarin (COUMADIN) 5 MG tablet Take  coumadin 1 1/2 tablets daily 60 tablet 3 03/18/2016 at 1800  . ferrous Q000111Q C-folic acid (TRINSICON / FOLTRIN) capsule Take 1 capsule by mouth 2 (two) times daily with a meal. 60 capsule 6 Unknown at Unknown time    Assessment: 75 yo man admitted with DOE to continue coumadin for afib.  INR is therapeutic this am.   Goal of Therapy:  INR 2-3 Monitor platelets by anticoagulation protocol: Yes   Plan:  Coumadin 7.5 mg po today Daily PT/INR Monitor for bleeding complications  Clide Remmers Poteet 03/19/2016,11:52 AM

## 2016-03-19 NOTE — ED Notes (Signed)
Patient ambulated in the ER. Patient's stated that he started to get short winded. Patient 02 saturation on room air between 88 to 92. When returning to room patient's 02 was 88 heart rate of 114, respirations 28. Patient on 2l at this time 02 sat is 95. Heart rate has decreased to 96 at this time.

## 2016-03-19 NOTE — Care Management Note (Signed)
Case Management Note  Patient Details  Name: Christian Rasmussen MRN: [id number] Date of Birth: Jun 12, 1940  Subjective/Objective:                  Pt admitted with CHF. He is from home, lives with his wife. He is ind with ADL's. He has PCP, Dr. Manuella Ghazi, he drives himself to appointments. He can afford his medications and has no difficulty managing them. he has a scale at home. He plans to return home with self care at DC.   Action/Plan: No CM needs anticipated.   Expected Discharge Date:     03/20/2016             Expected Discharge Plan:  Home/Self Care  In-House Referral:  NA  Discharge planning Services  NA  Post Acute Care Choice:  NA Choice offered to:  NA  Status of Service:  Completed, signed off   Sherald Barge, RN 03/19/2016, 2:02 PM

## 2016-03-19 NOTE — ED Provider Notes (Signed)
[facility] DEPT Provider Note   CSN: [id number] Arrival date & time: 03/18/16  2002     History   Chief Complaint Chief Complaint  Patient presents with  . Shortness of Breath    HPI Christian Rasmussen is a 75 y.o. male who presents to the emergency department with shortness of breath. He has a past medical history of diabetes, NSTEMI. The patient presented in June 2017 with shortness of breath and chest pressure which is when he had his NSTEMI. He also had a mitral valve repair and coronary artery bypass graft. Patient was also given a pacemaker. Patient states that he he is not felt very much improved in terms of his shortness of breath since that time. He had an echocardiogram that showed an ejection fraction of 55-60%. Patient states that he has had progressively worsening shortness of breath. However, over the past several days has become acutely worse. He states that he is unable to walk more than a few feet without gasping for air. He states that he felt nearly stifled and his wife thinks he may have been wheezing as well. These are new symptoms for him. Also, over the past month . His exertional dyspnea has certainly increased. He denies any weight gain or abnormal peripheral edema. He denies cough, fevers, chills, hemoptysis. He denies chest pain.  HPI  Past Medical History:  Diagnosis Date  . 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 Aortic stenosis 10/14/2015  . CAD (coronary artery disease)    status post Taxus stent patency of RCA 2004, Cardiolite negative for ischemia in 2010.  . Diabetes mellitus ([place])   . Dyslipidemia   . History of kidney stones   . Hypertension   . Mitral regurgitation 10/15/2015   Moderate-severe by intra-operative TEE  . Overweight   . Right bundle branch block (RBBB) with left anterior hemiblock   . 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 Snores   . Typical atrial flutter [facility])    s/p ablation 11-23-2012 by Dr Rayann Heman    Patient Active Problem List   Diagnosis Date Noted  . Acute diastolic CHF (congestive heart failure) ([place]) 03/19/2016  . Edema extremities 12/24/2015  . S/P CABG x 3   . 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 S/P mitral valve repair + CABG x4 10/15/2015  . Mitral regurgitation 10/15/2015  . Aortic insufficiency 10/15/2015  . Aortic stenosis 10/14/2015  . NSTEMI (non-ST elevated myocardial infarction) ([place]) 10/12/2015  . Unstable angina pectoris ([place]) 10/12/2015  . Mild aortic stenosis 04/13/2013  . Atrial flutter ([place]) 11/09/2012  . Snoring 11/09/2012  . Right bundle branch block (RBBB) with left anterior hemiblock   . Hypertension   . CAD (coronary artery disease)   . DYSLIPIDEMIA 04/11/2008  . CORONARY ATHEROSCLEROSIS NATIVE CORONARY ARTERY 04/11/2008  . PERCUTANEOUS TRANSLUMINAL CORONARY ANGIOPLASTY, HX OF 04/11/2008    Past Surgical History:  Procedure Laterality Date  . ABLATION  11-23-2012   s/p cavotricuspid isthmus ablation by Dr Rayann Heman  . ANKLE SURGERY Left    total ankle replacement  . ATRIAL FLUTTER ABLATION N/A 11/23/2012   Procedure: ATRIAL FLUTTER ABLATION;  Surgeon: Thompson Grayer, MD;  Location:  [facility] [facility] CATH LAB;  Service: Cardiovascular;  Laterality: N/A;  . CARDIAC CATHETERIZATION N/A 10/13/2015   Procedure: Left Heart Cath and Coronary Angiography;  Surgeon: Peter M Martinique, MD;  Location: [place] CV LAB;  Service: Cardiovascular;  Laterality: N/A;  . CATARACT EXTRACTION Right   . CATARACT EXTRACTION W/PHACO Left 09/13/2013   Procedure: CATARACT EXTRACTION PHACO AND INTRAOCULAR LENS PLACEMENT (IOC);  Surgeon: Tonny Branch, MD;  Location: [facility] ORS;  Service: Ophthalmology;  Laterality:  Left;  CDE 9.38  . COLONOSCOPY N/A 10/10/2014   Procedure: COLONOSCOPY;  Surgeon: Rogene Houston, MD;  Location: [facility] ENDO SUITE;  Service: Endoscopy;  Laterality: N/A;  830  . CORONARY ANGIOPLASTY     3 stents  . CORONARY ARTERY BYPASS GRAFT N/A 10/15/2015   Procedure: CORONARY ARTERY BYPASS GRAFTING (CABG) X4 LIMA-LAD; SEQ SVG-PD-PL; SVG-OM1 ENDOSCOPIC GREATER SAPHENOUS VEIN HARVEST(EVH) BILAT LOWER EXTREM;  Surgeon: Rexene Alberts, MD;  Location: [facility];  Service: Open Heart Surgery;  Laterality: N/A;  . EP IMPLANTABLE DEVICE N/A 10/20/2015   Procedure: Pacemaker Implant;  Surgeon: Evans Lance, MD;  Location: [facility] CV LAB;  Service: Cardiovascular;  Laterality: N/A;  . MITRAL VALVE REPAIR N/A 10/15/2015   Procedure: MITRAL VALVE REPAIR (MVR), # 28 MEMO 3-D RING ANNULOPLASTY AND COMPLEX VALVE REPAIR;  Surgeon: Rexene Alberts, MD;  Location: [place];  Service: Open Heart Surgery;  Laterality: N/A;  . TEE WITHOUT CARDIOVERSION N/A 10/15/2015   Procedure: TRANSESOPHAGEAL ECHOCARDIOGRAM (TEE);  Surgeon: Rexene Alberts, MD;  Location: [place];  Service: Open Heart Surgery;  Laterality: N/A;       Home Medications    Prior to Admission medications   Medication Sig Start Date End Date Taking? Authorizing Provider  amLODipine (NORVASC) 10 MG tablet Take 1 tablet (10 mg total) by mouth daily. 12/18/15   Herminio Commons, MD  aspirin EC 81 MG tablet Take 1 tablet (81 mg total) by mouth daily. 01/04/13   Thompson Grayer, MD  ASTAXANTHIN PO Take 10 mg by mouth daily. Reported on 10/30/2015    Historical Provider, MD  atorvastatin (LIPITOR) 40 MG tablet Take 1 tablet by mouth daily. Reported on 11/24/2015 02/19/13   Historical Provider, MD  carvedilol (COREG) 6.25 MG tablet Take 2 tablets (12.5 mg total) by mouth 2 (two) times daily. 12/04/15   Herminio Commons, MD  ferrous Q000111Q C-folic acid (TRINSICON / FOLTRIN) capsule Take 1 capsule by mouth 2 (two) times daily with a meal. 12/18/15    Herminio Commons, MD  furosemide (LASIX) 40 MG tablet TAKE ONE TABLET BY MOUTH ONCE DAILY 02/13/16   Herminio Commons, MD  lisinopril (PRINIVIL,ZESTRIL) 20 MG tablet Take 1 tablet (20 mg total) by mouth daily. 03/12/11   Ezra Sites, MD  metFORMIN (GLUCOPHAGE) 500 MG tablet Take 1,000 mg by mouth 2 (two) times daily with a meal. Reported on 10/30/2015 08/01/14   Historical Provider, MD  potassium chloride (K-DUR) 10 MEQ tablet Take 1 tablet (10 mEq total) by mouth daily. 01/23/16   Herminio Commons, MD  warfarin (COUMADIN) 5 MG tablet Take coumadin 1 1/2 tablets daily 01/07/16   Herminio Commons, MD    Family History Family History  Problem Relation Age of Onset  . Other Father 36    Died from MI.  . Other Mother     alive & well  . Leukemia Brother 2    died    Social History Social History  Substance  Use Topics  . Smoking status: Former Smoker    Packs/day: 1.00    Years: 28.00    Types: Cigarettes    Start date: 10/21/1956    Quit date: 05/10/1985  . Smokeless tobacco: Never Used  . Alcohol use 0.0 oz/week     Comment: occasional wine     Allergies   Patient has no known allergies.   Review of Systems Review of Systems Ten systems reviewed and are negative for acute change, except as noted in the HPI.    Physical Exam Updated Vital Signs BP 129/88 (BP Location: Right Arm)   Pulse 101   Temp 98 F (36.7 C) (Temporal)   Resp 20   Ht 5\' 7"  (1.702 m)   Wt 106.1 kg   SpO2 97%   BMI 36.65 kg/m   Physical Exam  Constitutional: He appears well-developed and well-nourished. No distress.  HENT:  Head: Normocephalic and atraumatic.  Eyes: Conjunctivae are normal. No scleral icterus.  Neck: Normal range of motion. Neck supple.  Cardiovascular: Normal rate, regular rhythm, normal heart sounds and intact distal pulses.   BL 2+ pitting edema   Pulmonary/Chest: Breath sounds normal. No respiratory distress.   Increased expiratory effort. No wheezes or  audible rails. Decreased breath breath sounds in the bilateral lung bases Patient speaking in full sentences, however, becomes labored when talking for a long time.  Abdominal: Soft. There is no tenderness.  Musculoskeletal: He exhibits no edema.  Neurological: He is alert.  Skin: Skin is warm and dry. He is not diaphoretic.  Psychiatric: His behavior is normal.  Nursing note and vitals reviewed.    ED Treatments / Results  Labs (all labs ordered are listed, but only abnormal results are displayed) Labs Reviewed  CBC WITH DIFFERENTIAL/PLATELET - Abnormal; Notable for the following:       Result Value   WBC 14.7 (*)    Hemoglobin 12.9 (*)    HCT 38.4 (*)    RDW 16.8 (*)    Neutro Abs 12.1 (*)    Monocytes Absolute 1.2 (*)    All other components within normal limits  COMPREHENSIVE METABOLIC PANEL - Abnormal; Notable for the following:    Glucose, Bld 123 (*)    ALT 15 (*)    All other components within normal limits  BRAIN NATRIURETIC PEPTIDE - Abnormal; Notable for the following:    B Natriuretic Peptide 252.0 (*)    All other components within normal limits  I-STAT TROPOININ, ED    EKG  EKG Interpretation  Date/Time:  Thursday March 18 2016 20:37:45 EST Ventricular Rate:  92 PR Interval:  182 QRS Duration: 158 QT Interval:  414 QTC Calculation: 511 R Axis:   -64 Text Interpretation:  Atrial-sensed ventricular-paced rhythm Abnormal ECG No significant change since last tracing Confirmed by [place] ([id number]) on 03/18/2016 11:40:49 PM       Radiology Dg Chest 2 View  Result Date: 03/18/2016 CLINICAL DATA:  Dyspnea for several months. History of diabetes, hypertension, coronary artery disease and CABG. EXAM: CHEST  2 VIEW COMPARISON:  11/24/2015 FINDINGS: The heart is top-normal in size. The patient is status post CABG. Small bilateral pleural effusions blunting the lateral costophrenic angles are identified with mild interstitial edema. No pneumonic  consolidation. Aortic atherosclerosis is visualized and left-sided pacemaker apparatus with right atrial and right ventricular leads are seen. No suspicious osseous abnormalities. IMPRESSION: Mild interstitial edema with small bilateral pleural effusions. Status post CABG with right atrial and  right ventricular pacing leads noted with left-sided pacemaker apparatus. Electronically Signed   By: Ashley Royalty M.D.   On: 03/18/2016 21:21    Procedures Procedures (including critical care time)  Medications Ordered in ED Medications  furosemide (LASIX) injection 80 mg (80 mg Intravenous Given 03/19/16 0022)  nitroGLYCERIN (NITROGLYN) 2 % ointment 1 inch (1 inch Topical Given 03/19/16 0058)     Initial Impression / Assessment and Plan / ED Course  I have reviewed the triage vital signs and the nursing notes.  Pertinent labs & imaging results that were available during my care of the patient were reviewed by me and considered in my medical decision making (see chart for details).  Clinical Course as of Mar 19 138  Thu Mar 18, 2016  2348 DG Chest 2 View [AH]    Clinical Course User Index [AH] Margarita Mail, PA-C   Mr. Feik is a very pleasant 75 year old man here with dyspnea. Chest x-ray shows pulmonary edema and bilateral pleural effusions. Patient was ambulated in the emergency department with oxygen desaturations to 88%. Placed on 2 L and  promptly increased his saturations above 90%. I have given the patient IV Lasix 80 mg and 1 inch of nitro paste as he is also noted to be hypertensive. Given his severe exertional dyspnea and volume overload. I feel that he will need inpatient treatment. Patient seen in shared visit with attending physician. Who agrees with assessment, work up , treatment, and plan for admission.    Final Clinical Impressions(s) / ED Diagnoses   Final diagnoses:  SOB (shortness of breath)  Acute pulmonary edema (HCC)  Hypoxia  Pleural effusion, bilateral    Hypertension, unspecified type    New Prescriptions New Prescriptions   No medications on file     Margarita Mail, PA-C 03/19/16 0139    Ripley Fraise, MD 03/19/16 [phone]

## 2016-03-19 NOTE — Care Management Important Message (Signed)
Important Message  Patient Details  Name: Christian Rasmussen MRN: [id number] Date of Birth: February 28, 1941   Medicare Important Message Given:  Yes    Sherald Barge, RN 03/19/2016, 9:06 AM

## 2016-03-19 NOTE — Progress Notes (Signed)
Patient has been relaxing and comfortable all day. He has not had any SOB while ambulating in room. O2 sats have been stable all day and no pain reported.  Margaret Pyle, RN

## 2016-03-20 DIAGNOSIS — R0902 Hypoxemia: Secondary | ICD-10-CM

## 2016-03-20 DIAGNOSIS — J90 Pleural effusion, not elsewhere classified: Secondary | ICD-10-CM

## 2016-03-20 DIAGNOSIS — I10 Essential (primary) hypertension: Secondary | ICD-10-CM

## 2016-03-20 DIAGNOSIS — Z9889 Other specified postprocedural states: Secondary | ICD-10-CM

## 2016-03-20 DIAGNOSIS — Z951 Presence of aortocoronary bypass graft: Secondary | ICD-10-CM

## 2016-03-20 DIAGNOSIS — I214 Non-ST elevation (NSTEMI) myocardial infarction: Secondary | ICD-10-CM

## 2016-03-20 LAB — BASIC METABOLIC PANEL
Anion gap: 6 (ref 5–15)
BUN: 18 mg/dL (ref 6–20)
CHLORIDE: 104 mmol/L (ref 101–111)
CO2: 25 mmol/L (ref 22–32)
CREATININE: 0.75 mg/dL (ref 0.61–1.24)
Calcium: 9.4 mg/dL (ref 8.9–10.3)
GFR calc non Af Amer: >60 mL/min (ref >60)
Glucose, Bld: 121 mg/dL — ABNORMAL HIGH (ref 65–99)
POTASSIUM: 4.1 mmol/L (ref 3.5–5.1)
SODIUM: 135 mmol/L (ref 135–145)

## 2016-03-20 LAB — PROTIME-INR
INR: 3.1
PROTHROMBIN TIME: 32.6 s — AB (ref 11.4–15.2)

## 2016-03-20 LAB — GLUCOSE, CAPILLARY: Glucose-Capillary: 168 mg/dL — ABNORMAL HIGH (ref 65–99)

## 2016-03-20 MED ORDER — WARFARIN SODIUM 5 MG PO TABS: 5.0000 mg | ORAL_TABLET | Freq: Once | ORAL | Status: DC

## 2016-03-20 MED ORDER — FUROSEMIDE 40 MG PO TABS: 40.0000 mg | ORAL_TABLET | Freq: Two times a day (BID) | ORAL | 0 refills | Status: DC

## 2016-03-20 NOTE — Discharge Summary (Addendum)
Physician Discharge Summary  Christian Rasmussen [id number] DOB: 1940/08/12 DOA: 03/18/2016  PCP: Monico Blitz, MD  Admit date: 03/18/2016 Discharge date: 03/20/2016  Admitted From: Home Disposition:  Home  Recommendations for Outpatient Follow-up:  1. Follow up with PCP in 1-2 weeks 2. Follow up with primary Cardiologist as scheduled on 03/25/16 3. Recommend repeat BMP in one week 4. Please note: patient's home lasix increased from QDday to BID dosing   Discharge Condition:Improved CODE STATUS:Full Diet recommendation: Heart healthy   Brief/Interim Summary: 75 y.o.malewith hx of CAD, s/p CABGx4, aortic stenosis, MR, aflutter s/p ablation, complete HB post op, s/p ppm, mitral valve repair, on chronic anticoagulation, having DOE since surgery, but more so tonight, so he presented to the ER. He has no fever, chills, nausea, vomiting or CP. Evaluation in the ER showed EKG with wide complex atrial paced rhythm, INR of 2.7, WBC 14.7 Hb of 12.9 and BNP of 252. His CXR showed instertial PNA and bilateral pleural effusion, and he was given 80mg  of IV lasix x 1 (his home lasix dose was 40mg  per day), with good diuresis, feeling better. His Sat however, dipped down to 88 percent with tachycardia on ambulation, and therefore, hospitalist was asked to admit him for likely acute diastolic CHF for further diuresis  1. Acutely decompensated diastolic CHF 1. Most recent 2d echo from 10/14/15 with normal LVEF and grade 2 diastolic failure 2. Patient presented with interstitial edema and small B pleural effusions 3. Clinical improvement with IV lasix.  4. Patient is near baseline dry weight (106kg dry), reported 104kg today 5. Patient ambulated on room air without difficulty and was able to maintain O2 sats over 93% 6. Patient's PO lasix was increased to BID dosing at time of discharge, further dose adjustments deferred to Cardiology 2. Aflutter 1. Rate controlled 2. Patient is s/p  pacemaker 3. Continued coumadin 3. CAD 1. Stable 2. Denies chest pain 4. S/p MVR 1. Continue coumadin for now 2. Stable 5. DM 1. Patient on metformin prior to admit 2. Glucose reviewed, stable thus far 6. HTN 1. BP remained stable and controlled 7. HLD 1. Continued atorvastatin as tolerated  Discharge Diagnoses:  Principal Problem:   Acute diastolic CHF (congestive heart failure) (HCC) Active Problems:   Atrial flutter (HCC)   NSTEMI (non-ST elevated myocardial infarction) (HCC)   S/P CABG x 4   S/P mitral valve repair + CABG x4    Discharge Instructions     Medication List    TAKE these medications   amLODipine 10 MG tablet Commonly known as:  NORVASC Take 1 tablet (10 mg total) by mouth daily.   aspirin EC 81 MG tablet Take 1 tablet (81 mg total) by mouth daily.   ASTAXANTHIN PO Take 10 mg by mouth daily. Reported on 10/30/2015   atorvastatin 40 MG tablet Commonly known as:  LIPITOR Take 1 tablet by mouth daily. Reported on 11/24/2015   carvedilol 6.25 MG tablet Commonly known as:  COREG Take 2 tablets (12.5 mg total) by mouth 2 (two) times daily.   ferrous Q000111Q C-folic acid capsule Commonly known as:  TRINSICON / FOLTRIN Take 1 capsule by mouth 2 (two) times daily with a meal.   furosemide 40 MG tablet Commonly known as:  LASIX Take 1 tablet (40 mg total) by mouth 2 (two) times daily. What changed:  See the new instructions.   lisinopril 20 MG tablet Commonly known as:  PRINIVIL,ZESTRIL Take 1 tablet (20 mg total) by mouth daily.  metFORMIN 500 MG tablet Commonly known as:  GLUCOPHAGE Take 1,000 mg by mouth 2 (two) times daily with a meal. Reported on 10/30/2015   potassium chloride 10 MEQ tablet Commonly known as:  K-DUR Take 1 tablet (10 mEq total) by mouth daily.   warfarin 5 MG tablet Commonly known as:  COUMADIN Take coumadin 1 1/2 tablets daily      Follow-up Information    SHAH,ASHISH, MD. Schedule an appointment  as soon as possible for a visit in 2 week(s).   Specialty:  Internal Medicine Contact information: [place] [address] [phone]        Kate Sable, MD Follow up.   Specialty:  Cardiology Why:  as scheduled on 03/25/16 Contact information: [place] [address] [phone]          No Known Allergies    Procedures/Studies: Dg Chest 2 View  Result Date: 03/18/2016 CLINICAL DATA:  Dyspnea for several months. History of diabetes, hypertension, coronary artery disease and CABG. EXAM: CHEST  2 VIEW COMPARISON:  11/24/2015 FINDINGS: The heart is top-normal in size. The patient is status post CABG. Small bilateral pleural effusions blunting the lateral costophrenic angles are identified with mild interstitial edema. No pneumonic consolidation. Aortic atherosclerosis is visualized and left-sided pacemaker apparatus with right atrial and right ventricular leads are seen. No suspicious osseous abnormalities. IMPRESSION: Mild interstitial edema with small bilateral pleural effusions. Status post CABG with right atrial and right ventricular pacing leads noted with left-sided pacemaker apparatus. Electronically Signed   By: Ashley Royalty M.D.   On: 03/18/2016 21:21    Subjective: Feeling better today  Discharge Exam: Vitals:   03/20/16 0800 03/20/16 0900  BP: 134/90 123/80  Pulse: 87 90  Resp: (!) 21 20  Temp:     Vitals:   03/20/16 0750 03/20/16 0800 03/20/16 0900 03/20/16 0927  BP:  134/90 123/80   Pulse: 89 87 90   Resp: 20 (!) 21 20   Temp: 97.2 F (36.2 C)     TempSrc: Oral     SpO2: 96% 96% 95% 92%  Weight:      Height:        General: Pt is alert, awake, not in acute distress Cardiovascular: RRR, S1/S2 +, no rubs, no gallops Respiratory: CTA bilaterally, no wheezing, no rhonchi Abdominal: Soft, NT, ND, bowel sounds + Extremities: no edema, no cyanosis   The results of significant diagnostics from this hospitalization (including  imaging, microbiology, ancillary and laboratory) are listed below for reference.     Microbiology: Recent Results (from the past 240 hour(s))  MRSA PCR Screening     Status: None   Collection Time: 03/19/16  2:20 AM  Result Value Ref Range Status   MRSA by PCR NEGATIVE NEGATIVE Final    Comment:        The GeneXpert MRSA Assay (FDA approved for NASAL specimens only), is one component of a comprehensive MRSA colonization surveillance program. It is not intended to diagnose MRSA infection nor to guide or monitor treatment for MRSA infections.      Labs: BNP (last 3 results)  Recent Labs  03/18/16 2118  BNP [id number]*   Basic Metabolic Panel:  Recent Labs Lab 03/18/16 2118 03/19/16 0521 03/20/16 0458  NA 137 137 135  K 4.6 3.7 4.1  CL 105 102 104  CO2 25 27 25   GLUCOSE 123* 128* 121*  BUN 18 16 18   CREATININE 0.68 0.70 0.75  CALCIUM 9.6 9.4  9.4   Liver Function Tests:  Recent Labs Lab 03/18/16 2118  AST 16  ALT 15*  ALKPHOS 62  BILITOT 0.8  PROT 7.4  ALBUMIN 4.0   No results for input(s): LIPASE, AMYLASE in the last 168 hours. No results for input(s): AMMONIA in the last 168 hours. CBC:  Recent Labs Lab 03/18/16 2118  WBC 14.7*  NEUTROABS 12.1*  HGB 12.9*  HCT 38.4*  MCV 86.9  PLT 296   Cardiac Enzymes: No results for input(s): CKTOTAL, CKMB, CKMBINDEX, TROPONINI in the last 168 hours. BNP: Invalid input(s): POCBNP CBG:  Recent Labs Lab 03/19/16 1204 03/19/16 1619 03/19/16 2231 03/20/16 0748  GLUCAP 96 114* 94 168*   D-Dimer No results for input(s): DDIMER in the last 72 hours. Hgb A1c No results for input(s): HGBA1C in the last 72 hours. Lipid Profile No results for input(s): CHOL, HDL, LDLCALC, TRIG, CHOLHDL, LDLDIRECT in the last 72 hours. Thyroid function studies  Recent Labs  03/18/16 2118  TSH 4.924*   Anemia work up No results for input(s): VITAMINB12, FOLATE, FERRITIN, TIBC, IRON, RETICCTPCT in the last 72  hours. Urinalysis    Component Value Date/Time   COLORURINE YELLOW 10/22/2015 [place] 10/22/2015 0854   LABSPEC 1.022 10/22/2015 0854   PHURINE 7.0 10/22/2015 0854   GLUCOSEU NEGATIVE 10/22/2015 0854   HGBUR NEGATIVE 10/22/2015 0854   BILIRUBINUR NEGATIVE 10/22/2015 0854   KETONESUR NEGATIVE 10/22/2015 0854   PROTEINUR NEGATIVE 10/22/2015 0854   NITRITE NEGATIVE 10/22/2015 0854   LEUKOCYTESUR NEGATIVE 10/22/2015 0854   Sepsis Labs Invalid input(s): PROCALCITONIN,  WBC,  LACTICIDVEN Microbiology Recent Results (from the past 240 hour(s))  MRSA PCR Screening     Status: None   Collection Time: 03/19/16  2:20 AM  Result Value Ref Range Status   MRSA by PCR NEGATIVE NEGATIVE Final    Comment:        The GeneXpert MRSA Assay (FDA approved for NASAL specimens only), is one component of a comprehensive MRSA colonization surveillance program. It is not intended to diagnose MRSA infection nor to guide or monitor treatment for MRSA infections.      SIGNED:   Donne Hazel, MD  [facility] 03/20/2016, 10:14 AM  If 7PM-7AM, please contact night-coverage [email or web address] Password [email or web address]

## 2016-03-20 NOTE — Progress Notes (Signed)
ANTICOAGULATION CONSULT NOTE   Pharmacy Consult for coumadin Indication: atrial fibrillation  No Known Allergies  Patient Measurements: Height: 5\' 11"  (180.3 cm) Weight: 229 lb 8 oz (104.1 kg) IBW/kg (Calculated) : 75.3   Vital Signs: Temp: 97.2 F (36.2 C) (11/11 0750) Temp Source: Oral (11/11 0750) BP: 134/90 (11/11 0800) Pulse Rate: 87 (11/11 0800)  Labs:  Recent Labs  03/18/16 0726 03/18/16 2118 03/19/16 0521 03/19/16 0849 03/20/16 0458  HGB  --  12.9*  --   --   --   HCT  --  38.4*  --   --   --   PLT  --  296  --   --   --   LABPROT  --   --   --  28.0* 32.6*  INR 2.7  --   --  2.56 3.10  CREATININE  --  0.68 0.70  --  0.75    Estimated Creatinine Clearance: 101 mL/min (by C-G formula based on SCr of 0.75 mg/dL).   Medications:  Prescriptions Prior to Admission  Medication Sig Dispense Refill Last Dose  . amLODipine (NORVASC) 10 MG tablet Take 1 tablet (10 mg total) by mouth daily. 30 tablet 6 03/18/2016 at Unknown time  . aspirin EC 81 MG tablet Take 1 tablet (81 mg total) by mouth daily. 90 tablet 3 03/18/2016 at Unknown time  . ASTAXANTHIN PO Take 10 mg by mouth daily. Reported on 10/30/2015   03/18/2016 at Unknown time  . atorvastatin (LIPITOR) 40 MG tablet Take 1 tablet by mouth daily. Reported on 11/24/2015   03/18/2016 at Unknown time  . carvedilol (COREG) 6.25 MG tablet Take 2 tablets (12.5 mg total) by mouth 2 (two) times daily. 360 tablet 3 03/18/2016 at 1800  . furosemide (LASIX) 40 MG tablet TAKE ONE TABLET BY MOUTH ONCE DAILY 30 tablet 6 03/18/2016 at Unknown time  . lisinopril (PRINIVIL,ZESTRIL) 20 MG tablet Take 1 tablet (20 mg total) by mouth daily. 90 tablet 3 03/18/2016 at Unknown time  . metFORMIN (GLUCOPHAGE) 500 MG tablet Take 1,000 mg by mouth 2 (two) times daily with a meal. Reported on 10/30/2015  2 03/18/2016 at Unknown time  . potassium chloride (K-DUR) 10 MEQ tablet Take 1 tablet (10 mEq total) by mouth daily. 90 tablet 3 03/18/2016 at Unknown  time  . warfarin (COUMADIN) 5 MG tablet Take coumadin 1 1/2 tablets daily 60 tablet 3 03/18/2016 at 1800  . ferrous Q000111Q C-folic acid (TRINSICON / FOLTRIN) capsule Take 1 capsule by mouth 2 (two) times daily with a meal. 60 capsule 6 Unknown at Unknown time    Assessment: 75 yo man admitted with DOE to continue coumadin for afib.  INR is 3.10 this am Goal of Therapy:  INR 2-3 Monitor platelets by anticoagulation protocol: Yes   Plan:  Coumadin 5 mg po today Daily PT/INR Monitor for bleeding complications  Clements Toro [place] 03/20/2016,9:03 AM

## 2016-03-20 NOTE — Discharge Instructions (Signed)
Heart Failure  Heart failure means your heart has trouble pumping blood. This makes it hard for your body to work well. Heart failure is usually a long-term (chronic) condition. You must take good care of yourself and follow your doctor's treatment plan.  HOME CARE   Take your heart medicine as told by your doctor.    Do not stop taking medicine unless your doctor tells you to.    Do not skip any dose of medicine.    Refill your medicines before they run out.    Take other medicines only as told by your doctor or pharmacist.   Stay active if told by your doctor. The elderly and people with severe heart failure should talk with a doctor about physical activity.   Eat heart-healthy foods. Choose foods that are without trans fat and are low in saturated fat, cholesterol, and salt (sodium). This includes fresh or frozen fruits and vegetables, fish, lean meats, fat-free or low-fat dairy foods, whole grains, and high-fiber foods. Lentils and dried peas and beans (legumes) are also good choices.   Limit salt if told by your doctor.   Cook in a healthy way. Roast, grill, broil, bake, poach, steam, or stir-fry foods.   Limit fluids as told by your doctor.   Weigh yourself every morning. Do this after you pee (urinate) and before you eat breakfast. Write down your weight to give to your doctor.   Take your blood pressure and write it down if your doctor tells you to.   Ask your doctor how to check your pulse. Check your pulse as told.   Lose weight if told by your doctor.   Stop smoking or chewing tobacco. Do not use gum or patches that help you quit without your doctor's approval.   Schedule and go to doctor visits as told.   Nonpregnant women should have no more than 1 drink a day. Men should have no more than 2 drinks a day. Talk to your doctor about drinking alcohol.   Stop illegal drug use.   Stay current with shots (immunizations).   Manage your health conditions as told by your doctor.   Learn to  manage your stress.   Rest when you are tired.   If it is really hot outside:    Avoid intense activities.    Use air conditioning or fans, or get in a cooler place.    Avoid caffeine and alcohol.    Wear loose-fitting, lightweight, and light-colored clothing.   If it is really cold outside:    Avoid intense activities.    Layer your clothing.    Wear mittens or gloves, a hat, and a scarf when going outside.    Avoid alcohol.   Learn about heart failure and get support as needed.   Get help to maintain or improve your quality of life and your ability to care for yourself as needed.  GET HELP IF:    You gain weight quickly.   You are more short of breath than usual.   You cannot do your normal activities.   You tire easily.   You cough more than normal, especially with activity.   You have any or more puffiness (swelling) in areas such as your hands, feet, ankles, or belly (abdomen).   You cannot sleep because it is hard to breathe.   You feel like your heart is beating fast (palpitations).   You get dizzy or light-headed when you stand up.  GET HELP   RIGHT AWAY IF:    You have trouble breathing.   There is a change in mental status, such as becoming less alert or not being able to focus.   You have chest pain or discomfort.   You faint.  MAKE SURE YOU:    Understand these instructions.   Will watch your condition.   Will get help right away if you are not doing well or get worse.     This information is not intended to replace advice given to you by your health care provider. Make sure you discuss any questions you have with your health care provider.     Document Released: 02/03/2008 Document Revised: 05/17/2014 Document Reviewed: 06/12/2012  Elsevier Interactive Patient Education 2016 [employer] Inc[employer]

## 2016-03-24 DIAGNOSIS — I4891 Unspecified atrial fibrillation: Secondary | ICD-10-CM | POA: Diagnosis not present

## 2016-03-24 DIAGNOSIS — I10 Essential (primary) hypertension: Secondary | ICD-10-CM | POA: Diagnosis not present

## 2016-03-24 DIAGNOSIS — E7800 Pure hypercholesterolemia, unspecified: Secondary | ICD-10-CM | POA: Diagnosis not present

## 2016-03-25 ENCOUNTER — Ambulatory Visit (INDEPENDENT_AMBULATORY_CARE_PROVIDER_SITE_OTHER): Payer: Medicare Other | Admitting: Cardiovascular Disease

## 2016-03-25 ENCOUNTER — Encounter: Payer: Self-pay | Admitting: Cardiovascular Disease

## 2016-03-25 VITALS — BP 124/78 | HR 96 | Ht 71.0 in | Wt 229.0 lb

## 2016-03-25 DIAGNOSIS — Z9889 Other specified postprocedural states: Secondary | ICD-10-CM

## 2016-03-25 DIAGNOSIS — Z5181 Encounter for therapeutic drug level monitoring: Secondary | ICD-10-CM

## 2016-03-25 DIAGNOSIS — I2510 Atherosclerotic heart disease of native coronary artery without angina pectoris: Secondary | ICD-10-CM | POA: Diagnosis not present

## 2016-03-25 DIAGNOSIS — I5033 Acute on chronic diastolic (congestive) heart failure: Secondary | ICD-10-CM

## 2016-03-25 DIAGNOSIS — Z79899 Other long term (current) drug therapy: Secondary | ICD-10-CM | POA: Diagnosis not present

## 2016-03-25 DIAGNOSIS — I509 Heart failure, unspecified: Secondary | ICD-10-CM | POA: Diagnosis not present

## 2016-03-25 DIAGNOSIS — I4510 Unspecified right bundle-branch block: Secondary | ICD-10-CM

## 2016-03-25 DIAGNOSIS — I10 Essential (primary) hypertension: Secondary | ICD-10-CM

## 2016-03-25 DIAGNOSIS — Z950 Presence of cardiac pacemaker: Secondary | ICD-10-CM

## 2016-03-25 DIAGNOSIS — I4892 Unspecified atrial flutter: Secondary | ICD-10-CM | POA: Diagnosis not present

## 2016-03-25 DIAGNOSIS — Z951 Presence of aortocoronary bypass graft: Secondary | ICD-10-CM

## 2016-03-25 DIAGNOSIS — Z09 Encounter for follow-up examination after completed treatment for conditions other than malignant neoplasm: Secondary | ICD-10-CM | POA: Diagnosis not present

## 2016-03-25 NOTE — Progress Notes (Signed)
SUBJECTIVE: [name] returns for follow up for CABG and mitral valve repair. 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 Lovena Le. Recently hospitalized for acute diastolic heart failure. Doing well today and denies chest pain and shortness of breath. Had several questions about his cardiac history.   Review of Systems: As per "subjective", otherwise negative.  No Known Allergies  Current Outpatient Prescriptions  Medication Sig Dispense Refill  . amLODipine (NORVASC) 10 MG tablet Take 1 tablet (10 mg total) by mouth daily. 30 tablet 6  . aspirin EC 81 MG tablet Take 1 tablet (81 mg total) by mouth daily. 90 tablet 3  . ASTAXANTHIN PO Take 10 mg by mouth daily. Reported on 10/30/2015    . atorvastatin (LIPITOR) 40 MG tablet Take 1 tablet by mouth daily. Reported on 11/24/2015    . carvedilol (COREG) 6.25 MG tablet Take 2 tablets (12.5 mg total) by mouth 2 (two) times daily. 360 tablet 3  . furosemide (LASIX) 40 MG tablet Take 1 tablet (40 mg total) by mouth 2 (two) times daily. 30 tablet 0  . lisinopril (PRINIVIL,ZESTRIL) 20 MG tablet Take 1 tablet (20 mg total) by mouth daily. 90 tablet 3  . metFORMIN (GLUCOPHAGE) 500 MG tablet Take 1,000 mg by mouth 2 (two) times daily with a meal. Reported on 10/30/2015  2  . potassium chloride (K-DUR) 10 MEQ tablet Take 1 tablet (10 mEq total) by mouth daily. 90 tablet 3  . warfarin (COUMADIN) 5 MG tablet Take coumadin 1 1/2 tablets daily 60 tablet 3   No current facility-administered medications for this visit.     Past Medical History:  Diagnosis Date  . 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 Aortic stenosis 10/14/2015  . CAD (coronary artery disease)    status post Taxus stent patency of RCA 2004, Cardiolite negative for ischemia in 2010.  . Diabetes mellitus ([place])   . Dyslipidemia   . History of kidney stones   . Hypertension   . Mitral regurgitation 10/15/2015   Moderate-severe by  intra-operative TEE  . Overweight   . Right bundle branch block (RBBB) with left anterior hemiblock   . 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 Snores   . Typical atrial flutter [facility])    s/p ablation 11-23-2012 by Dr Rayann Heman    Past Surgical History:  Procedure Laterality Date  . ABLATION  11-23-2012   s/p cavotricuspid isthmus ablation by Dr Rayann Heman  . ANKLE SURGERY Left    total ankle replacement  . ATRIAL FLUTTER ABLATION N/A 11/23/2012   Procedure: ATRIAL FLUTTER ABLATION;  Surgeon: Thompson Grayer, MD;  Location: [facility] CATH LAB;  Service: Cardiovascular;  Laterality: N/A;  . CARDIAC CATHETERIZATION N/A 10/13/2015   Procedure: Left Heart Cath and Coronary Angiography;  Surgeon: Peter M Martinique, MD;  Location: [place] CV LAB;  Service: Cardiovascular;  Laterality: N/A;  . CATARACT EXTRACTION Right   . CATARACT EXTRACTION W/PHACO Left 09/13/2013   Procedure: CATARACT EXTRACTION PHACO AND INTRAOCULAR LENS PLACEMENT (IOC);  Surgeon: Tonny Branch, MD;  Location: [facility] ORS;  Service: Ophthalmology;  Laterality: Left;  CDE 9.38  . COLONOSCOPY N/A  10/10/2014   Procedure: COLONOSCOPY;  Surgeon: Rogene Houston, MD;  Location: [facility] ENDO SUITE;  Service: Endoscopy;  Laterality: N/A;  830  . CORONARY ANGIOPLASTY     3 stents  . CORONARY ARTERY BYPASS GRAFT N/A 10/15/2015   Procedure: CORONARY ARTERY BYPASS GRAFTING (CABG) X4 LIMA-LAD; SEQ SVG-PD-PL; SVG-OM1 ENDOSCOPIC GREATER SAPHENOUS VEIN HARVEST(EVH) BILAT LOWER EXTREM;  Surgeon: Rexene Alberts, MD;  Location: [place];  Service: Open Heart Surgery;  Laterality: N/A;  . EP IMPLANTABLE DEVICE N/A 10/20/2015   Procedure: Pacemaker Implant;  Surgeon:  Evans Lance, MD;  Location: [place] CV LAB;  Service: Cardiovascular;  Laterality: N/A;  . MITRAL VALVE REPAIR N/A 10/15/2015   Procedure: MITRAL VALVE REPAIR (MVR), # 28 MEMO 3-D RING ANNULOPLASTY AND COMPLEX VALVE REPAIR;  Surgeon: Rexene Alberts, MD;  Location: [place];  Service: Open Heart Surgery;  Laterality: N/A;  . TEE WITHOUT CARDIOVERSION N/A 10/15/2015   Procedure: TRANSESOPHAGEAL ECHOCARDIOGRAM (TEE);  Surgeon: Rexene Alberts, MD;  Location: [place];  Service: Open Heart Surgery;  Laterality: N/A;    Social History   Social History  . Marital status: Married    Spouse name: N/A  . Number of children: N/A  . Years of education: N/A   Occupational History  . 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 Smoking status: Former Smoker    Packs/day: 1.00    Years: 28.00    Types: Cigarettes    Start date: 10/21/1956    Quit date: 05/10/1985  . Smokeless tobacco: Never Used  . Alcohol use 0.0 oz/week     Comment: occasional wine  . Drug use: No  . Sexual activity: Yes    Birth control/ protection: None   Other Topics Concern  . Not on file   Social History Narrative   Lives in [place] with spouse.   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 Lungs: Clear to auscultation bilaterally with normal respiratory effort. CV: Nondisplaced PMI. Regular rate and rhythm, normal S1/S2, no S3/S4, I/VI early systolic murmur over RUSB. Trace right perinankle edema.  Abdomen: Soft, nontender, obese, no distention.  Neurologic: Alert and oriented x 3.  Psych: Normal affect. Skin: Normal. Musculoskeletal: No gross deformities. Extremities: No clubbing or cyanosis.     ECG: Most recent ECG reviewed.      ASSESSMENT AND PLAN:  1. CAD s/p 4-v CABG: Continue aspirin, Lipitor,  Coreg, and lisinopril. Will repeat echocardiogram.  2. S/p mitral valve repair: Stable. Repeat echo.  3. S/p PPM for CHB: Stable. F/u with Dr. Lovena Le.  4. Essential HTN: Controlled. No changes.  5. Hyperlipidemia: continue Lipitor.  6. Chronic diastolic heart failure: Euvolemic. Continue Lasix at present dose.  7. Atrial flutter: No additional recurrences. Will stop warfarin.  Dispo: fu 3 months.  Time spent: 40 minutes, of which greater than 50% was spent reviewing symptoms, relevant blood tests and studies, and discussing management plan with the patient.   Kate Sable, M.D., F.A.C.C.

## 2016-03-25 NOTE — Patient Instructions (Signed)
Medication Instructions:   Stop Warfarin.  All future INR checks have been cancelled.  Continue all other medications.    Labwork: none  Testing/Procedures:  Your physician has requested that you have an echocardiogram. Echocardiography is a painless test that uses sound waves to create images of your heart. It provides your doctor with information about the size and shape of your heart and how well your heart's chambers and valves are working. This procedure takes approximately one hour. There are no restrictions for this procedure.  Office will contact with results via phone or letter.    Follow-Up: 3 months   Any Other Special Instructions Will Be Listed Below (If Applicable).  If you need a refill on your cardiac medications before your next appointment, please call your pharmacy.

## 2016-03-29 ENCOUNTER — Other Ambulatory Visit: Payer: Self-pay | Admitting: Cardiovascular Disease

## 2016-03-29 MED ORDER — FUROSEMIDE 40 MG PO TABS: 40.0000 mg | ORAL_TABLET | Freq: Two times a day (BID) | ORAL | 3 refills | Status: DC

## 2016-03-29 NOTE — Telephone Encounter (Signed)
Patient need Rx sent into [facility] in [place] for his lasix 60 pr 120

## 2016-03-29 NOTE — Telephone Encounter (Signed)
Medication sent to pharmacy  

## 2016-03-30 ENCOUNTER — Other Ambulatory Visit: Payer: Self-pay | Admitting: *Deleted

## 2016-03-30 MED ORDER — FUROSEMIDE 40 MG PO TABS: 40.0000 mg | ORAL_TABLET | Freq: Two times a day (BID) | ORAL | 3 refills | Status: DC

## 2016-04-08 ENCOUNTER — Ambulatory Visit (INDEPENDENT_AMBULATORY_CARE_PROVIDER_SITE_OTHER): Payer: Medicare Other

## 2016-04-08 ENCOUNTER — Other Ambulatory Visit: Payer: Self-pay

## 2016-04-08 DIAGNOSIS — I5033 Acute on chronic diastolic (congestive) heart failure: Secondary | ICD-10-CM

## 2016-04-08 LAB — ECHOCARDIOGRAM COMPLETE
AO mean calculated velocity dopler: 166 cm/s
AOPV: 0.47 m/s
AV Area mean vel: 1.21 cm2
AV Mean grad: 13 mmHg
AV peak Index: 0.61
AV pk vel: 232 cm/s
AV vel: 1.39
AVA: 1.39 cm2
AVAREAMEANVIN: 0.54 cm2/m2
AVAREAVTI: 1.35 cm2
AVAREAVTIIND: 0.62 cm2/m2
AVCELMEANRAT: 0.43
AVPG: 22 mmHg
AVPHT: 684 ms
CHL CUP LVOT MV VTI INDEX: 0.41 cm2/m2
CHL CUP LVOT MV VTI: 0.92
CHL CUP TV REG PEAK VELOCITY: 319 cm/s
E decel time: 362 msec
E/e' ratio: 32.73
FS: 29 % (ref 28–44)
IV/PV OW: 0.86
LA ID, A-P, ES: 32 mm
LA diam index: 1.43 cm/m2
LA vol A4C: 55.4 ml
LA vol index: 37.9 mL/m2
LA vol: 84.6 mL
LEFT ATRIUM END SYS DIAM: 32 mm
LV E/e' medial: 32.73
LV E/e'average: 32.73
LV PW d: 11 mm — AB (ref 0.6–1.1)
LV SIMPSON'S DISK: 38
LV dias vol index: 57 mL/m2
LV e' LATERAL: 6.05 cm/s
LV sys vol: 78 mL — AB (ref 21–61)
LVDIAVOL: 126 mL (ref 62–150)
LVOT VTI: 22.4 cm
LVOT area: 2.84 cm2
LVOT peak vel: 110 cm/s
LVOTD: 19 mm
LVOTSV: 64 mL
LVOTVTI: 0.49 cm
LVSYSVOLIN: 35 mL/m2
MV Annulus VTI: 68.9 cm
MV Dec: 362
MV M vel: 97.9
MV Peak grad: 16 mmHg
MV pk E vel: 198 m/s
MVAP: 1.05 cm2
MVG: 6 mmHg
MVPKAVEL: 151 m/s
MVSPHT: 211 ms
RV sys press: 44 mmHg
Stroke v: 48 ml
TAPSE: 11.7 mm
TDI e' lateral: 6.05
TDI e' medial: 3.44
TR max vel: 319 cm/s
VTI: 45.7 cm
Valve area index: 0.62

## 2016-04-13 ENCOUNTER — Telehealth: Payer: Self-pay | Admitting: Cardiovascular Disease

## 2016-04-13 DIAGNOSIS — I5033 Acute on chronic diastolic (congestive) heart failure: Secondary | ICD-10-CM

## 2016-04-13 NOTE — Telephone Encounter (Signed)
PERCERT VERIFICATION:   Christian Rasmussen had a recent Echo . Dr. Bronson Ing is ordering a limited echo with contrast. Scheduled for 04-16-16 at [facility].  Patient is concerned that his insurance will not cover.

## 2016-04-13 NOTE — Telephone Encounter (Signed)
Christian Rasmussen called requesting test results of recent echo   Please call the cell # [phone].

## 2016-04-13 NOTE — Telephone Encounter (Signed)
Notes Recorded by [name], LPN on 624THL at 624THL PM EST Patient notified and verbalized understanding. Copy to pmd. ------  Notes Recorded by Herminio Commons, MD on 04/08/2016 at 3:05 PM EST Repeat a limited echo with contrast for more accurate determination of LVEF.

## 2016-04-13 NOTE — Telephone Encounter (Signed)
New order will be put in and sent to [facility] for scheduling.

## 2016-04-16 ENCOUNTER — Ambulatory Visit (HOSPITAL_COMMUNITY)
Admission: RE | Admit: 2016-04-16 | Discharge: 2016-04-16 | Disposition: A | Discharge status: Home / Self Care | Payer: Medicare Other | Source: Ambulatory Visit | Attending: Cardiovascular Disease | Admitting: Cardiovascular Disease

## 2016-04-16 ENCOUNTER — Other Ambulatory Visit: Payer: Self-pay

## 2016-04-16 DIAGNOSIS — I5021 Acute systolic (congestive) heart failure: Secondary | ICD-10-CM

## 2016-04-16 DIAGNOSIS — I2510 Atherosclerotic heart disease of native coronary artery without angina pectoris: Secondary | ICD-10-CM | POA: Insufficient documentation

## 2016-04-16 DIAGNOSIS — I252 Old myocardial infarction: Secondary | ICD-10-CM | POA: Insufficient documentation

## 2016-04-16 DIAGNOSIS — I5033 Acute on chronic diastolic (congestive) heart failure: Secondary | ICD-10-CM

## 2016-04-16 DIAGNOSIS — E785 Hyperlipidemia, unspecified: Secondary | ICD-10-CM | POA: Insufficient documentation

## 2016-04-16 DIAGNOSIS — Z951 Presence of aortocoronary bypass graft: Secondary | ICD-10-CM | POA: Insufficient documentation

## 2016-04-16 MED ORDER — PERFLUTREN LIPID MICROSPHERE
1.0000 mL | INTRAVENOUS | Status: AC | PRN
  Administered 2016-04-16: 1 mL via INTRAVENOUS

## 2016-04-16 NOTE — Progress Notes (Signed)
*  PRELIMINARY RESULTS* Echocardiogram 2D Echocardiogram limited with definity has been performed.  Leavy Cella 04/16/2016, 10:25 AM

## 2016-04-19 ENCOUNTER — Telehealth: Payer: Self-pay | Admitting: Cardiovascular Disease

## 2016-04-19 NOTE — Telephone Encounter (Signed)
Per echo result, pt was notified of result today by Christian Rasmussen

## 2016-04-19 NOTE — Telephone Encounter (Signed)
Christian Rasmussen called wanting test results of most recent Echo.

## 2016-04-22 DIAGNOSIS — I4891 Unspecified atrial fibrillation: Secondary | ICD-10-CM | POA: Diagnosis not present

## 2016-04-22 DIAGNOSIS — I10 Essential (primary) hypertension: Secondary | ICD-10-CM | POA: Diagnosis not present

## 2016-04-22 DIAGNOSIS — E7800 Pure hypercholesterolemia, unspecified: Secondary | ICD-10-CM | POA: Diagnosis not present

## 2016-04-26 ENCOUNTER — Ambulatory Visit (INDEPENDENT_AMBULATORY_CARE_PROVIDER_SITE_OTHER): Payer: Medicare Other | Admitting: *Deleted

## 2016-04-26 ENCOUNTER — Other Ambulatory Visit: Payer: Self-pay | Admitting: *Deleted

## 2016-04-26 DIAGNOSIS — I4892 Unspecified atrial flutter: Secondary | ICD-10-CM | POA: Diagnosis not present

## 2016-04-26 MED ORDER — AMLODIPINE BESYLATE 10 MG PO TABS: 10.0000 mg | ORAL_TABLET | Freq: Every day | ORAL | 3 refills | Status: DC

## 2016-04-26 NOTE — Progress Notes (Signed)
Remote pacemaker transmission.   

## 2016-04-28 ENCOUNTER — Encounter (HOSPITAL_COMMUNITY): Payer: Self-pay | Admitting: Emergency Medicine

## 2016-04-28 ENCOUNTER — Emergency Department (HOSPITAL_COMMUNITY): Payer: Medicare Other

## 2016-04-28 ENCOUNTER — Emergency Department (HOSPITAL_COMMUNITY)
Admission: EM | Admit: 2016-04-28 | Discharge: 2016-04-29 | Disposition: A | Discharge status: Home / Self Care | Payer: Medicare Other | Attending: Emergency Medicine | Admitting: Emergency Medicine

## 2016-04-28 ENCOUNTER — Encounter: Payer: Self-pay | Admitting: Cardiology

## 2016-04-28 DIAGNOSIS — Z7982 Long term (current) use of aspirin: Secondary | ICD-10-CM | POA: Insufficient documentation

## 2016-04-28 DIAGNOSIS — Z87891 Personal history of nicotine dependence: Secondary | ICD-10-CM | POA: Diagnosis not present

## 2016-04-28 DIAGNOSIS — N132 Hydronephrosis with renal and ureteral calculous obstruction: Secondary | ICD-10-CM | POA: Diagnosis not present

## 2016-04-28 DIAGNOSIS — Z952 Presence of prosthetic heart valve: Secondary | ICD-10-CM | POA: Diagnosis not present

## 2016-04-28 DIAGNOSIS — Z7984 Long term (current) use of oral hypoglycemic drugs: Secondary | ICD-10-CM | POA: Diagnosis not present

## 2016-04-28 DIAGNOSIS — N201 Calculus of ureter: Secondary | ICD-10-CM | POA: Insufficient documentation

## 2016-04-28 DIAGNOSIS — Z951 Presence of aortocoronary bypass graft: Secondary | ICD-10-CM | POA: Diagnosis not present

## 2016-04-28 DIAGNOSIS — I110 Hypertensive heart disease with heart failure: Secondary | ICD-10-CM | POA: Insufficient documentation

## 2016-04-28 DIAGNOSIS — R1031 Right lower quadrant pain: Secondary | ICD-10-CM | POA: Diagnosis present

## 2016-04-28 DIAGNOSIS — N23 Unspecified renal colic: Secondary | ICD-10-CM

## 2016-04-28 DIAGNOSIS — I5031 Acute diastolic (congestive) heart failure: Secondary | ICD-10-CM | POA: Diagnosis not present

## 2016-04-28 DIAGNOSIS — I10 Essential (primary) hypertension: Secondary | ICD-10-CM | POA: Diagnosis not present

## 2016-04-28 DIAGNOSIS — I2510 Atherosclerotic heart disease of native coronary artery without angina pectoris: Secondary | ICD-10-CM | POA: Diagnosis not present

## 2016-04-28 DIAGNOSIS — N1339 Other hydronephrosis: Secondary | ICD-10-CM | POA: Diagnosis not present

## 2016-04-28 DIAGNOSIS — Z79899 Other long term (current) drug therapy: Secondary | ICD-10-CM | POA: Insufficient documentation

## 2016-04-28 LAB — URINALYSIS, ROUTINE W REFLEX MICROSCOPIC
BILIRUBIN URINE: NEGATIVE
Glucose, UA: NEGATIVE mg/dL
Ketones, ur: NEGATIVE mg/dL
LEUKOCYTES UA: NEGATIVE
NITRITE: NEGATIVE
PH: 5 (ref 5.0–8.0)
Protein, ur: NEGATIVE mg/dL
SPECIFIC GRAVITY, URINE: 1.016 (ref 1.005–1.030)
WBC, UA: NONE SEEN WBC/hpf (ref 0–5)

## 2016-04-28 LAB — COMPREHENSIVE METABOLIC PANEL
ALT: 17 U/L (ref 17–63)
AST: 17 U/L (ref 15–41)
Albumin: 4.4 g/dL (ref 3.5–5.0)
Alkaline Phosphatase: 74 U/L (ref 38–126)
Anion gap: 8 (ref 5–15)
BILIRUBIN TOTAL: 0.5 mg/dL (ref 0.3–1.2)
BUN: 32 mg/dL — AB (ref 6–20)
CO2: 28 mmol/L (ref 22–32)
Calcium: 10.3 mg/dL (ref 8.9–10.3)
Chloride: 100 mmol/L — ABNORMAL LOW (ref 101–111)
Creatinine, Ser: 1.34 mg/dL — ABNORMAL HIGH (ref 0.61–1.24)
GFR calc Af Amer: 59 mL/min — ABNORMAL LOW (ref >60)
GFR, EST NON AFRICAN AMERICAN: 51 mL/min — AB (ref >60)
Glucose, Bld: 181 mg/dL — ABNORMAL HIGH (ref 65–99)
POTASSIUM: 4.3 mmol/L (ref 3.5–5.1)
Sodium: 136 mmol/L (ref 135–145)
Total Protein: 7.8 g/dL (ref 6.5–8.1)

## 2016-04-28 LAB — CBC
HEMATOCRIT: 40.7 % (ref 39.0–52.0)
Hemoglobin: 13.4 g/dL (ref 13.0–17.0)
MCH: 29.5 pg (ref 26.0–34.0)
MCHC: 32.9 g/dL (ref 30.0–36.0)
MCV: 89.6 fL (ref 78.0–100.0)
PLATELETS: 300 10*3/uL (ref 150–400)
RBC: 4.54 MIL/uL (ref 4.22–5.81)
RDW: 15.5 % (ref 11.5–15.5)
WBC: 18.6 10*3/uL — AB (ref 4.0–10.5)

## 2016-04-28 LAB — LIPASE, BLOOD: Lipase: 32 U/L (ref 11–51)

## 2016-04-28 NOTE — ED Notes (Signed)
Pt reports rlq abd discomfort that started approx 1730 tonight, states he was nauseated, but has not vomited or had any diarrhea.  Pt has tenderness to palpation but no rebound tenderness.

## 2016-04-28 NOTE — ED Triage Notes (Signed)
Pt reports RLQ pain that started approx 3-4 hours ago. Pt denies fever. Denies emesis.

## 2016-04-28 NOTE — ED Provider Notes (Signed)
[facility] DEPT Provider Note   CSN: [id number] Arrival date & time: 04/28/16  1955  By signing my name below, I, Jeanell Sparrow, attest that this documentation has been prepared under the direction and in the presence of Ripley Fraise, MD . Electronically Signed: Jeanell Sparrow, Scribe. 04/28/2016. 11:27 PM.  History   Chief Complaint Chief Complaint  Patient presents with  . Abdominal Pain   The history is provided by the patient. No language interpreter was used.  Abdominal Pain   This is a new problem. The current episode started 6 to 12 hours ago. The problem occurs constantly. The problem has been gradually worsening. The pain is associated with an unknown factor. The pain is located in the RLQ. The pain is at a severity of 0/10. The patient is experiencing no pain. Associated symptoms include nausea. Pertinent negatives include diarrhea, vomiting and dysuria. Nothing aggravates the symptoms. Nothing relieves the symptoms. Past workup does not include surgery.   HPI Comments: Christian Rasmussen is a 75 y.o. male with a PMHx of kidney stones who presents to the Emergency Department complaining of intermittent moderate RLQ pain that started earlier today. He states he started having a gradual onset of gradually worsening pain today. He currently reports no pain or modifying factors. He describes the pain as different from prior kidney stones. He reports associated nausea. He denies any prior hx of similar complaint, hx of abdominal surgeries, vomiting, dysuria, cough, diarrhea, back pain, flank pain, use of blood thinners, or other complaints.     PCP: Monico Blitz, MD  Past Medical History:  Diagnosis Date  . 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 Aortic stenosis 10/14/2015  . CAD (coronary artery disease)    status post Taxus stent patency of RCA 2004, Cardiolite negative for ischemia in 2010.  . Diabetes mellitus ([place])   . Dyslipidemia   . History of kidney stones    . Hypertension   . Mitral regurgitation 10/15/2015   Moderate-severe by intra-operative TEE  . Overweight   . Right bundle branch block (RBBB) with left anterior hemiblock   . 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 Snores   . Typical atrial flutter [facility])    s/p ablation 11-23-2012 by Dr Rayann Heman    Patient Active Problem List   Diagnosis Date Noted  . Hypoxia   . Pleural effusion, bilateral   . Acute diastolic CHF (congestive heart failure) ([place]) 03/19/2016  . Edema extremities 12/24/2015  . S/P CABG x 3   . 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 S/P mitral valve repair + CABG x4 10/15/2015  . Mitral regurgitation 10/15/2015  . Aortic insufficiency 10/15/2015  . Aortic stenosis 10/14/2015  . NSTEMI (non-ST elevated myocardial infarction) ([place]) 10/12/2015  . Unstable angina pectoris ([place]) 10/12/2015  . Mild aortic stenosis 04/13/2013  . Atrial flutter ([place]) 11/09/2012  . Snoring 11/09/2012  . Right bundle branch block (RBBB) with left anterior hemiblock   . Hypertension   . CAD (coronary artery disease)   . DYSLIPIDEMIA 04/11/2008  . CORONARY ATHEROSCLEROSIS NATIVE CORONARY ARTERY 04/11/2008  . PERCUTANEOUS TRANSLUMINAL CORONARY ANGIOPLASTY, HX OF 04/11/2008    Past Surgical History:  Procedure  Laterality Date  . ABLATION  11-23-2012   s/p cavotricuspid isthmus ablation by Dr Rayann Heman  . ANKLE SURGERY Left    total ankle replacement  . ATRIAL FLUTTER ABLATION N/A 11/23/2012   Procedure: ATRIAL FLUTTER ABLATION;  Surgeon: Thompson Grayer, MD;  Location: [facility] LAB;  Service: Cardiovascular;  Laterality: N/A;  . CARDIAC CATHETERIZATION N/A 10/13/2015   Procedure: Left Heart Cath  and Coronary Angiography;  Surgeon: Peter M Martinique, MD;  Location: [facility] CV LAB;  Service: Cardiovascular;  Laterality: N/A;  . CATARACT EXTRACTION Right   . CATARACT EXTRACTION W/PHACO Left 09/13/2013   Procedure: CATARACT EXTRACTION PHACO AND INTRAOCULAR LENS PLACEMENT (IOC);  Surgeon: Tonny Branch, MD;  Location: [facility] ORS;  Service: Ophthalmology;  Laterality: Left;  CDE 9.38  . COLONOSCOPY N/A 10/10/2014   Procedure: COLONOSCOPY;  Surgeon: Rogene Houston, MD;  Location: [facility] ENDO SUITE;  Service: Endoscopy;  Laterality: N/A;  830  . CORONARY ANGIOPLASTY     3 stents  . CORONARY ARTERY BYPASS GRAFT N/A 10/15/2015   Procedure: CORONARY ARTERY BYPASS GRAFTING (CABG) X4 LIMA-LAD; SEQ SVG-PD-PL; SVG-OM1 ENDOSCOPIC GREATER SAPHENOUS VEIN HARVEST(EVH) BILAT LOWER EXTREM;  Surgeon: Rexene Alberts, MD;  Location: [facility];  Service: Open Heart Surgery;  Laterality: N/A;  . EP IMPLANTABLE DEVICE N/A 10/20/2015   Procedure: Pacemaker Implant;  Surgeon: Evans Lance, MD;  Location: [facility] CV LAB;  Service: Cardiovascular;  Laterality: N/A;  . MITRAL VALVE REPAIR N/A 10/15/2015   Procedure: MITRAL VALVE REPAIR (MVR), # 28 MEMO 3-D RING ANNULOPLASTY AND COMPLEX VALVE REPAIR;  Surgeon: Rexene Alberts, MD;  Location: [facility];  Service: Open Heart Surgery;  Laterality: N/A;  . TEE WITHOUT CARDIOVERSION N/A 10/15/2015   Procedure: TRANSESOPHAGEAL ECHOCARDIOGRAM (TEE);  Surgeon: Rexene Alberts, MD;  Location: [facility];  Service: Open Heart Surgery;  Laterality: N/A;       Home Medications    Prior to Admission medications   Medication Sig Start Date End Date Taking? Authorizing Provider  amLODipine (NORVASC) 10 MG tablet Take 1 tablet (10 mg total) by mouth daily. 04/26/16  Yes Herminio Commons, MD  aspirin EC 81 MG tablet Take 1 tablet (81 mg total) by mouth daily. 01/04/13  Yes Thompson Grayer, MD  ASTAXANTHIN PO Take 10 mg by mouth daily. Reported on 10/30/2015   Yes Historical Provider, MD  atorvastatin  (LIPITOR) 40 MG tablet Take 1 tablet by mouth daily. Reported on 11/24/2015 02/19/13  Yes Historical Provider, MD  carvedilol (COREG) 6.25 MG tablet Take 2 tablets (12.5 mg total) by mouth 2 (two) times daily. 12/04/15  Yes Herminio Commons, MD  furosemide (LASIX) 40 MG tablet Take 1 tablet (40 mg total) by mouth 2 (two) times daily. 03/30/16  Yes Herminio Commons, MD  lisinopril (PRINIVIL,ZESTRIL) 20 MG tablet Take 1 tablet (20 mg total) by mouth daily. 03/12/11  Yes Ezra Sites, MD  metFORMIN (GLUCOPHAGE) 500 MG tablet Take 1,000 mg by mouth 2 (two) times daily with a meal. Reported on 10/30/2015 08/01/14  Yes Historical Provider, MD  potassium chloride (K-DUR) 10 MEQ tablet Take 1 tablet (10 mEq total) by mouth daily. 01/23/16  Yes Herminio Commons, MD    Family History Family History  Problem Relation Age of Onset  . Other Father 17    Died from MI.  . Other Mother     alive & well  . Leukemia Brother 2    died    Social  History Social History  Substance Use Topics  . Smoking status: Former Smoker    Packs/day: 1.00    Years: 28.00    Types: Cigarettes    Start date: 10/21/1956    Quit date: 05/10/1985  . Smokeless tobacco: Never Used  . Alcohol use 0.0 oz/week     Comment: occasional wine     Allergies   Patient has no known allergies.   Review of Systems Review of Systems  Respiratory: Negative for cough.   Gastrointestinal: Positive for abdominal pain (RLQ) and nausea. Negative for diarrhea and vomiting.  Genitourinary: Negative for dysuria and flank pain.  Musculoskeletal: Negative for back pain.  Hematological: Does not bruise/bleed easily.  All other systems reviewed and are negative.    Physical Exam Updated Vital Signs BP 131/63 (BP Location: Left Arm)   Pulse 96   Temp 97.8 F (36.6 C) (Oral)   Resp 17   Ht 5' 11.6" (1.819 m)   Wt 230 lb (104.3 kg)   SpO2 94%   BMI 31.54 kg/m   Physical Exam  Nursing note and vitals  reviewed. CONSTITUTIONAL: Well developed/well nourished HEAD: Normocephalic/atraumatic EYES: EOMI/PERRL ENMT: Mucous membranes moist NECK: supple no meningeal signs SPINE/BACK:entire spine nontender CV: S1/S2 noted LUNGS: Lungs are clear to auscultation bilaterally, no apparent distress ABDOMEN: soft, mild RLQ TTP, no rebound or guarding, bowel sounds noted throughout abdomen GU:no cva tenderness, no hernia, no scrotal TTP, chaperone present for exam NEURO: Pt is awake/alert/appropriate, moves all extremitiesx4.  No facial droop.   EXTREMITIES: pulses normal/equal, full ROM SKIN: warm, color normal PSYCH: no abnormalities of mood noted, alert and oriented to situation  ED Treatments / Results  DIAGNOSTIC STUDIES: Oxygen Saturation is 94% on RA, normal by my interpretation.    COORDINATION OF CARE: 11:31 PM- Pt advised of plan for treatment and pt agrees.  Labs (all labs ordered are listed, but only abnormal results are displayed) Labs Reviewed  COMPREHENSIVE METABOLIC PANEL - Abnormal; Notable for the following:       Result Value   Chloride 100 (*)    Glucose, Bld 181 (*)    BUN 32 (*)    Creatinine, Ser 1.34 (*)    GFR calc non Af Amer 51 (*)    GFR calc Af Amer 59 (*)    All other components within normal limits  CBC - Abnormal; Notable for the following:    WBC 18.6 (*)    All other components within normal limits  URINALYSIS, ROUTINE W REFLEX MICROSCOPIC - Abnormal; Notable for the following:    Hgb urine dipstick LARGE (*)    Bacteria, UA RARE (*)    All other components within normal limits  LIPASE, BLOOD    EKG  EKG Interpretation None       Radiology Ct Abdomen Pelvis W Contrast  Result Date: 04/29/2016 CLINICAL DATA:  75 year old male with right lower quadrant abdominal pain. EXAM: CT ABDOMEN AND PELVIS WITH CONTRAST TECHNIQUE: Multidetector CT imaging of the abdomen and pelvis was performed using the standard protocol following bolus administration  of intravenous contrast. CONTRAST:  131mL ISOVUE-300 IOPAMIDOL (ISOVUE-300) INJECTION 61% COMPARISON:  None. FINDINGS: Lower chest: Partially visualized small left pleural effusion with associated partial compressive atelectasis of the left lower lobe. There is partially visualized mechanical mitral valve. Advanced coronary vascular calcification and CABG procedure as well as cardiac pacemaker lead in the right ventricle. No intra-abdominal free air or free fluid. Hepatobiliary: Mild diffuse fatty infiltration of the liver. Small  scattered calcified granuloma. No intrahepatic biliary ductal dilatation. The gallbladder is contracted and grossly unremarkable. Pancreas: Unremarkable. No pancreatic ductal dilatation or surrounding inflammatory changes. Spleen: Normal in size without focal abnormality. Adrenals/Urinary Tract: The adrenal glands appear unremarkable. There is a 5 mm distal right ureteral stone with mild right hydronephrosis. There is a 12 mm nonobstructing or partially obstructing stone in the right renal pelvis. Nonobstructing bilateral renal calculi measure 6 mm in the upper pole of the left kidney. There is no hydronephrosis on the left. Right renal perinephric stranding as well as asymmetrically decreased uptake and excretion of contrast by the right kidney noted. Correlation with urinalysis recommended to exclude superimposed UTI. The urinary bladder is unremarkable. Stomach/Bowel: There is sigmoid diverticulosis with muscular hypertrophy. Moderate stool noted throughout the colon. Thickened loops of proximal small bowel in the left hemiabdomen may be related to underdistention or represent mild enteritis. Clinical correlation is recommended. There is submucosal fat deposit in the distal small bowel and the wall of the terminal ileum which may be sequela of chronic inflammation. There is no evidence of bowel obstruction. The appendix is not visualized and may be surgically absent. Vascular/Lymphatic:  There is advanced aortoiliac atherosclerotic disease. The origins of the celiac axis, SMA, IMA as well as the origins of the renal arteries are patent. The SMV, splenic vein, and main portal vein are patent. No portal venous gas identified. There is no adenopathy. Reproductive: The prostate and seminal vesicles are grossly unremarkable Other: There is a small fat containing left inguinal hernia. There is focal protrusion of sigmoid below the left inguinal ligament. Musculoskeletal: Osteopenia with multilevel degenerative changes of the spine and hips. No acute fractures. Sixty IMPRESSION: A 5 mm distal right ureteral stone with mild right hydronephrosis. Correlation with urinalysis recommended to exclude superimposed UTI. Other small nonobstructing bilateral renal calculi. Mild thickening of the small bowel loop in the left hemiabdomen may be related to underdistention or represent enteritis. Clinical correlation is recommended. No bowel obstruction. Sigmoid diverticulosis. Partially visualized small left pleural effusion. Electronically Signed   By: Anner Crete M.D.   On: 04/29/2016 00:42    Procedures Procedures (including critical care time)  Medications Ordered in ED Medications  iopamidol (ISOVUE-300) 61 % injection 100 mL (100 mLs Intravenous Contrast Given 04/29/16 0015)     Initial Impression / Assessment and Plan / ED Course  I have reviewed the triage vital signs and the nursing notes.  Pertinent labs & imaging results that were available during my care of the patient were reviewed by me and considered in my medical decision making (see chart for details).  Clinical Course     Due to abrupt pain, RLQ tenderness and elevated WBC, CT imaging ordered Pt with ureteral stone Pt improved He would like to be discharged Referred to urology Advised to hold lasix for 24 hrs due to mild renal insufficiency Also instructed to hold metformin due to IV contrast   Final Clinical  Impressions(s) / ED Diagnoses   Final diagnoses:  Ureteral colic  Ureteral stone    New Prescriptions Discharge Medication List as of 04/29/2016  1:17 AM    START taking these medications   Details  HYDROcodone-acetaminophen (NORCO/VICODIN) 5-325 MG tablet Take 1 tablet by mouth every 6 (six) hours as needed for severe pain., Starting Thu 04/29/2016, Print       I personally performed the services described in this documentation, which was scribed in my presence. The recorded information has been reviewed and is  accurate.        Ripley Fraise, MD 04/29/16 [phone]

## 2016-04-29 DIAGNOSIS — N132 Hydronephrosis with renal and ureteral calculous obstruction: Secondary | ICD-10-CM | POA: Diagnosis not present

## 2016-04-29 LAB — CUP PACEART REMOTE DEVICE CHECK
Brady Statistic RA Percent Paced: 0 %
Brady Statistic RV Percent Paced: 99 %
Date Time Interrogation Session: 20171221100154
Implantable Lead Implant Date: 20170612
Implantable Lead Location: 753859
Implantable Lead Model: 7741
Implantable Lead Serial Number: 760165
Lead Channel Pacing Threshold Amplitude: 0.8 V
Lead Channel Sensing Intrinsic Amplitude: 16.8 mV
Lead Channel Setting Pacing Amplitude: 1.2 V
Lead Channel Setting Pacing Amplitude: 3.5 V
Lead Channel Setting Pacing Pulse Width: 0.4 ms
Lead Channel Setting Sensing Sensitivity: 2.5 mV
MDC IDC LEAD IMPLANT DT: 20170612
MDC IDC LEAD LOCATION: 753860
MDC IDC LEAD SERIAL: 733074
MDC IDC MSMT LEADCHNL RA IMPEDANCE VALUE: 697 Ohm
MDC IDC MSMT LEADCHNL RA PACING THRESHOLD AMPLITUDE: 0.7 V
MDC IDC MSMT LEADCHNL RA PACING THRESHOLD PULSEWIDTH: 0.4 ms
MDC IDC MSMT LEADCHNL RA SENSING INTR AMPL: 3.5 mV
MDC IDC MSMT LEADCHNL RV IMPEDANCE VALUE: 679 Ohm
MDC IDC MSMT LEADCHNL RV PACING THRESHOLD PULSEWIDTH: 0.4 ms
MDC IDC PG IMPLANT DT: 20170612
MDC IDC PG SERIAL: 724121

## 2016-04-29 MED ORDER — HYDROCODONE-ACETAMINOPHEN 5-325 MG PO TABS: 1.0000 | ORAL_TABLET | Freq: Four times a day (QID) | ORAL | 0 refills | Status: DC | PRN

## 2016-04-29 MED ORDER — IOPAMIDOL (ISOVUE-300) INJECTION 61%
100.0000 mL | Freq: Once | INTRAVENOUS | Status: AC | PRN
  Administered 2016-04-29: 100 mL via INTRAVENOUS

## 2016-04-29 NOTE — Discharge Instructions (Signed)
PLEASE HOLD YOUR LASIX UNTIL Friday MORNING PLEASE HOLD YOUR METFORMIN UNTIL Saturday MORNING

## 2016-05-05 DIAGNOSIS — Z125 Encounter for screening for malignant neoplasm of prostate: Secondary | ICD-10-CM | POA: Diagnosis not present

## 2016-05-05 DIAGNOSIS — Z0000 Encounter for general adult medical examination without abnormal findings: Secondary | ICD-10-CM | POA: Diagnosis not present

## 2016-05-05 DIAGNOSIS — Z79899 Other long term (current) drug therapy: Secondary | ICD-10-CM | POA: Diagnosis not present

## 2016-05-05 DIAGNOSIS — Z299 Encounter for prophylactic measures, unspecified: Secondary | ICD-10-CM | POA: Diagnosis not present

## 2016-05-05 DIAGNOSIS — E7800 Pure hypercholesterolemia, unspecified: Secondary | ICD-10-CM | POA: Diagnosis not present

## 2016-05-05 DIAGNOSIS — Z7189 Other specified counseling: Secondary | ICD-10-CM | POA: Diagnosis not present

## 2016-05-05 DIAGNOSIS — R5383 Other fatigue: Secondary | ICD-10-CM | POA: Diagnosis not present

## 2016-05-05 DIAGNOSIS — Z6832 Body mass index (BMI) 32.0-32.9, adult: Secondary | ICD-10-CM | POA: Diagnosis not present

## 2016-05-05 DIAGNOSIS — Z1211 Encounter for screening for malignant neoplasm of colon: Secondary | ICD-10-CM | POA: Diagnosis not present

## 2016-05-05 DIAGNOSIS — Z1389 Encounter for screening for other disorder: Secondary | ICD-10-CM | POA: Diagnosis not present

## 2016-05-05 DIAGNOSIS — E1151 Type 2 diabetes mellitus with diabetic peripheral angiopathy without gangrene: Secondary | ICD-10-CM | POA: Diagnosis not present

## 2016-05-11 DIAGNOSIS — H43813 Vitreous degeneration, bilateral: Secondary | ICD-10-CM | POA: Diagnosis not present

## 2016-05-11 DIAGNOSIS — H40013 Open angle with borderline findings, low risk, bilateral: Secondary | ICD-10-CM | POA: Diagnosis not present

## 2016-05-11 DIAGNOSIS — Z961 Presence of intraocular lens: Secondary | ICD-10-CM | POA: Diagnosis not present

## 2016-05-11 DIAGNOSIS — H35031 Hypertensive retinopathy, right eye: Secondary | ICD-10-CM | POA: Diagnosis not present

## 2016-05-11 DIAGNOSIS — H3540 Unspecified peripheral retinal degeneration: Secondary | ICD-10-CM | POA: Diagnosis not present

## 2016-05-11 DIAGNOSIS — Z9841 Cataract extraction status, right eye: Secondary | ICD-10-CM | POA: Diagnosis not present

## 2016-05-11 DIAGNOSIS — H26492 Other secondary cataract, left eye: Secondary | ICD-10-CM | POA: Diagnosis not present

## 2016-05-11 DIAGNOSIS — E113292 Type 2 diabetes mellitus with mild nonproliferative diabetic retinopathy without macular edema, left eye: Secondary | ICD-10-CM | POA: Diagnosis not present

## 2016-05-11 DIAGNOSIS — Z7984 Long term (current) use of oral hypoglycemic drugs: Secondary | ICD-10-CM | POA: Diagnosis not present

## 2016-05-11 DIAGNOSIS — H524 Presbyopia: Secondary | ICD-10-CM | POA: Diagnosis not present

## 2016-05-11 DIAGNOSIS — I10 Essential (primary) hypertension: Secondary | ICD-10-CM | POA: Diagnosis not present

## 2016-05-11 DIAGNOSIS — H35032 Hypertensive retinopathy, left eye: Secondary | ICD-10-CM | POA: Diagnosis not present

## 2016-05-12 DIAGNOSIS — E7800 Pure hypercholesterolemia, unspecified: Secondary | ICD-10-CM | POA: Diagnosis not present

## 2016-05-12 DIAGNOSIS — I4891 Unspecified atrial fibrillation: Secondary | ICD-10-CM | POA: Diagnosis not present

## 2016-05-12 DIAGNOSIS — I10 Essential (primary) hypertension: Secondary | ICD-10-CM | POA: Diagnosis not present

## 2016-06-03 DIAGNOSIS — Z961 Presence of intraocular lens: Secondary | ICD-10-CM | POA: Diagnosis not present

## 2016-06-03 DIAGNOSIS — H35032 Hypertensive retinopathy, left eye: Secondary | ICD-10-CM | POA: Diagnosis not present

## 2016-06-03 DIAGNOSIS — Z9841 Cataract extraction status, right eye: Secondary | ICD-10-CM | POA: Diagnosis not present

## 2016-06-03 DIAGNOSIS — H3540 Unspecified peripheral retinal degeneration: Secondary | ICD-10-CM | POA: Diagnosis not present

## 2016-06-03 DIAGNOSIS — H26492 Other secondary cataract, left eye: Secondary | ICD-10-CM | POA: Diagnosis not present

## 2016-06-03 DIAGNOSIS — H40013 Open angle with borderline findings, low risk, bilateral: Secondary | ICD-10-CM | POA: Diagnosis not present

## 2016-06-03 DIAGNOSIS — Z7984 Long term (current) use of oral hypoglycemic drugs: Secondary | ICD-10-CM | POA: Diagnosis not present

## 2016-06-03 DIAGNOSIS — I10 Essential (primary) hypertension: Secondary | ICD-10-CM | POA: Diagnosis not present

## 2016-06-03 DIAGNOSIS — H524 Presbyopia: Secondary | ICD-10-CM | POA: Diagnosis not present

## 2016-06-03 DIAGNOSIS — H35031 Hypertensive retinopathy, right eye: Secondary | ICD-10-CM | POA: Diagnosis not present

## 2016-06-03 DIAGNOSIS — E113292 Type 2 diabetes mellitus with mild nonproliferative diabetic retinopathy without macular edema, left eye: Secondary | ICD-10-CM | POA: Diagnosis not present

## 2016-06-03 DIAGNOSIS — H43813 Vitreous degeneration, bilateral: Secondary | ICD-10-CM | POA: Diagnosis not present

## 2016-06-17 DIAGNOSIS — I4891 Unspecified atrial fibrillation: Secondary | ICD-10-CM | POA: Diagnosis not present

## 2016-06-17 DIAGNOSIS — E7800 Pure hypercholesterolemia, unspecified: Secondary | ICD-10-CM | POA: Diagnosis not present

## 2016-06-17 DIAGNOSIS — I10 Essential (primary) hypertension: Secondary | ICD-10-CM | POA: Diagnosis not present

## 2016-06-25 ENCOUNTER — Ambulatory Visit (INDEPENDENT_AMBULATORY_CARE_PROVIDER_SITE_OTHER): Payer: Medicare Other | Admitting: Cardiovascular Disease

## 2016-06-25 ENCOUNTER — Encounter: Payer: Self-pay | Admitting: Cardiovascular Disease

## 2016-06-25 VITALS — BP 115/75 | HR 86 | Ht 71.0 in | Wt 229.2 lb

## 2016-06-25 DIAGNOSIS — I4892 Unspecified atrial flutter: Secondary | ICD-10-CM | POA: Diagnosis not present

## 2016-06-25 DIAGNOSIS — I25708 Atherosclerosis of coronary artery bypass graft(s), unspecified, with other forms of angina pectoris: Secondary | ICD-10-CM

## 2016-06-25 DIAGNOSIS — I5042 Chronic combined systolic (congestive) and diastolic (congestive) heart failure: Secondary | ICD-10-CM

## 2016-06-25 DIAGNOSIS — Z951 Presence of aortocoronary bypass graft: Secondary | ICD-10-CM

## 2016-06-25 DIAGNOSIS — Z9889 Other specified postprocedural states: Secondary | ICD-10-CM

## 2016-06-25 DIAGNOSIS — I10 Essential (primary) hypertension: Secondary | ICD-10-CM

## 2016-06-25 DIAGNOSIS — I209 Angina pectoris, unspecified: Secondary | ICD-10-CM

## 2016-06-25 DIAGNOSIS — I350 Nonrheumatic aortic (valve) stenosis: Secondary | ICD-10-CM

## 2016-06-25 DIAGNOSIS — Z950 Presence of cardiac pacemaker: Secondary | ICD-10-CM

## 2016-06-25 NOTE — Patient Instructions (Signed)
Your physician wants you to follow-up in: [place]. Bronson Ing  You will receive a reminder letter in the mail two months in advance. If you don't receive a letter, please call our office to schedule the follow-up appointment.  Your physician has recommended you make the following change in your medication:   DECREASE LASIX 40 MG DAILY   MONITOR YOUR WEIGHTS DAILY IF INCREASING TAKE LASIX 40 MG TWICE DAILY   Thank you for choosing [facility]!!

## 2016-06-25 NOTE — Progress Notes (Signed)
SUBJECTIVE: Christian Rasmussen returns for follow up for CABG, chronic systolic and diastolic heart failure, and mitral valve repair. 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 Lovena Le. He wants to decrease his Lasix dose if possible. He denies chest pain, leg swelling, orthopnea, palpitations, dizziness, and shortness of breath.  Echocardiogram showed mildly reduced left ventricular systolic function, LVEF [id number]. There was mild aortic stenosis and regurgitation. There was a moderate gradient of 6 mmHg across the mitral valve with annuloplasty ring present.   Review of Systems: As per "subjective", otherwise negative.  No Known Allergies  Current Outpatient Prescriptions  Medication Sig Dispense Refill  . amLODipine (NORVASC) 10 MG tablet Take 1 tablet (10 mg total) by mouth daily. 90 tablet 3  . aspirin EC 81 MG tablet Take 1 tablet (81 mg total) by mouth daily. 90 tablet 3  . ASTAXANTHIN PO Take 10 mg by mouth daily. Reported on 10/30/2015    . atorvastatin (LIPITOR) 40 MG tablet Take 1 tablet by mouth daily. Reported on 11/24/2015    . carvedilol (COREG) 6.25 MG tablet Take 2 tablets (12.5 mg total) by mouth 2 (two) times daily. 360 tablet 3  . furosemide (LASIX) 40 MG tablet Take 40 mg by mouth daily.    Marland Kitchen lisinopril (PRINIVIL,ZESTRIL) 20 MG tablet Take 1 tablet (20 mg total) by mouth daily. 90 tablet 3  . metFORMIN (GLUCOPHAGE) 500 MG tablet Take 1,000 mg by mouth 2 (two) times daily with a meal. Reported on 10/30/2015  2  . potassium chloride (K-DUR) 10 MEQ tablet Take 1 tablet (10 mEq total) by mouth daily. 90 tablet 3  . HYDROcodone-acetaminophen (NORCO/VICODIN) 5-325 MG tablet Take 1 tablet by mouth every 6 (six) hours as needed for severe pain. (Patient not taking: Reported on 06/25/2016) 6 tablet 0   No current facility-administered medications for this visit.     Past Medical History:  Diagnosis Date  . 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 Aortic stenosis 10/14/2015  . CAD (coronary artery disease)    status post Taxus stent patency of RCA 2004, Cardiolite negative for ischemia in 2010.  . Diabetes mellitus ([place])   . Dyslipidemia   . History of kidney stones   . Hypertension   . Mitral regurgitation 10/15/2015   Moderate-severe by intra-operative TEE  . Overweight   . Right bundle branch block (RBBB) with left anterior hemiblock   . 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 Snores   . Typical atrial flutter [facility])    s/p ablation 11-23-2012 by Dr Rayann Heman    Past Surgical History:  Procedure Laterality Date  . ABLATION  11-23-2012   s/p cavotricuspid isthmus ablation by Dr Rayann Heman  . ANKLE SURGERY Left    total ankle replacement  . ATRIAL FLUTTER ABLATION N/A 11/23/2012   Procedure: ATRIAL FLUTTER ABLATION;  Surgeon: Thompson Grayer, MD;  Location: [facility] CATH LAB;  Service: Cardiovascular;  Laterality: N/A;  . CARDIAC CATHETERIZATION N/A 10/13/2015   Procedure: Left Heart Cath and Coronary Angiography;  Surgeon: Peter M Martinique, MD;  Location: [place] CV LAB;  Service: Cardiovascular;  Laterality: N/A;  .  CATARACT EXTRACTION Right   . CATARACT EXTRACTION W/PHACO Left 09/13/2013   Procedure: CATARACT EXTRACTION PHACO AND INTRAOCULAR LENS PLACEMENT (IOC);  Surgeon: Tonny Branch, MD;  Location: [facility] ORS;  Service: Ophthalmology;  Laterality: Left;  CDE 9.38  . COLONOSCOPY N/A 10/10/2014   Procedure: COLONOSCOPY;  Surgeon: Rogene Houston, MD;  [facility] ENDO [facility];  Service: Endoscopy;  Laterality: N/A;  830  . CORONARY ANGIOPLASTY     3 stents  . CORONARY ARTERY BYPASS GRAFT N/A 10/15/2015   Procedure: CORONARY  ARTERY BYPASS GRAFTING (CABG) X4 LIMA-LAD; SEQ SVG-PD-PL; SVG-OM1 ENDOSCOPIC GREATER SAPHENOUS VEIN HARVEST(EVH) BILAT LOWER EXTREM;  Surgeon: Rexene Alberts, MD;  Location: [place];  Service: Open Heart Surgery;  Laterality: N/A;  . EP IMPLANTABLE DEVICE N/A 10/20/2015   Procedure: Pacemaker Implant;  Surgeon: Evans Lance, MD;  Location: [facility] CV LAB;  Service: Cardiovascular;  Laterality: N/A;  . MITRAL VALVE REPAIR N/A 10/15/2015   Procedure: MITRAL VALVE REPAIR (MVR), # 28 MEMO 3-D RING ANNULOPLASTY AND COMPLEX VALVE REPAIR;  Surgeon: Rexene Alberts, MD;  Location: [place];  Service: Open Heart Surgery;  Laterality: N/A;  . TEE WITHOUT CARDIOVERSION N/A 10/15/2015   Procedure: TRANSESOPHAGEAL ECHOCARDIOGRAM (TEE);  Surgeon: Rexene Alberts, MD;  Location: [facility] [place];  Service: Open Heart Surgery;  Laterality: N/A;    Social History   Social History  . Marital status: Married    Spouse name: N/A  . Number of children: N/A  . Years of education: N/A   Occupational History  . 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 Smoking status: Former Smoker    Packs/day: 1.00    Years: 28.00    Types: Cigarettes    Start date: 10/21/1956    Quit date: 05/10/1985  . Smokeless tobacco: Never Used  . Alcohol use 0.0 oz/week     Comment: occasional wine  . Drug use: No  . Sexual activity: Yes    Birth control/ protection: None   Other Topics Concern  . Not on file   Social History Narrative   Lives in [place] with spouse.   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 Lungs: Clear to auscultation bilaterally with normal respiratory effort. CV: Nondisplaced PMI. Regular rate and rhythm, normal S1/S2, no S3/S4, I/VI early systolic murmur over RUSB. Trace right perinankle edema.  Abdomen:  Soft, nontender, obese, no distention.  Neurologic: Alert and oriented x 3.  Psych: Normal affect. Skin: Normal. Musculoskeletal: No gross deformities. Extremities: No clubbing or cyanosis.      ECG: Most recent ECG reviewed.      ASSESSMENT AND PLAN: 1. CAD s/p 4-v CABG: Continue aspirin, Lipitor, Coreg, and lisinopril.   2. S/p mitral valve repair: Moderate gradient of 6 mmHg. Will monitor.  3. S/p PPM for CHB: Stable. F/u with Dr. Lovena Le.  4. Essential HTN: Controlled. No changes.  5. Hyperlipidemia: continue Lipitor.  6. Chronic systolic and diastolic heart failure, EF 45-50%: Euvolemic. Continue Lasix at reduced dose of 40 mg daily as per his request. He has been instructed to weigh himself daily. Should he develop leg swelling, shortness of breath, or weight gain of 3 pounds within 24 hours, he should resume Lasix 40 mg  twice daily indefinitely.  7. Atrial flutter: No additional recurrences. No longer on warfarin.  8. Aortic stenosis and regurgitation: Mild. Will monitor.  Dispo: fu 6 months.   Kate Sable, M.D., F.A.C.C.

## 2016-07-08 ENCOUNTER — Telehealth: Payer: Self-pay | Admitting: Cardiovascular Disease

## 2016-07-08 ENCOUNTER — Telehealth: Payer: Self-pay

## 2016-07-08 DIAGNOSIS — I4891 Unspecified atrial fibrillation: Secondary | ICD-10-CM

## 2016-07-08 NOTE — Telephone Encounter (Signed)
-----   Message from Herminio Commons, MD sent at 07/07/2016 10:35 AM EST ----- Would check a CBC. If Hgb ok, can restart warfarin and have him follow up in our anticoagulation clinic.  ----- Message ----- From: Wanda Plump, RN Sent: 07/06/2016   3:21 PM To: Herminio Commons, MD, Iven Finn, LPN  Good Afternoon Dr. Lindon Romp,   Mr. Alessio has had some atrial fibrillation/ flutter episodes recently, some episodes have lasted up to an hour. Dr. Tanna Furry recommendation is to restart his Coumadin, but Dr. Lovena Le is going to leave the decision to restart up to you. Please call if you have any questions.  Thank You Theodoro Doing RN [phone]

## 2016-07-08 NOTE — Telephone Encounter (Signed)
Pt on road,I spoke with wife, she will relay message to patient and I have placed lab slip to [facility] for CBC

## 2016-07-08 NOTE — Telephone Encounter (Signed)
Called, pt unavailable. Left voice message for pt to call back.  

## 2016-07-08 NOTE — Telephone Encounter (Signed)
Patient had cbc drawn at [facility]  in [place] earlier today  Per Dr Bronson Ing: "Would check a CBC.If HGB ok,  can restart warfarin and have him follow up in our anticoagulation clinic."      I will route to Edrick Oh RN, Coumadin Nurse and have her contact patient when CBC results are back   Patient may be reached on cell [phone]

## 2016-07-08 NOTE — Telephone Encounter (Signed)
Patient has gone and done his blood work.  Would like to know what needs to be done now

## 2016-07-09 LAB — CBC
HEMATOCRIT: 35.2 % — AB (ref 37.5–51.0)
HEMOGLOBIN: 11.8 g/dL — AB (ref 13.0–17.7)
MCH: 29 pg (ref 26.6–33.0)
MCHC: 33.5 g/dL (ref 31.5–35.7)
MCV: 87 fL (ref 79–97)
Platelets: 306 10*3/uL (ref 150–379)
RBC: 4.07 x10E6/uL — AB (ref 4.14–5.80)
RDW: 15.7 % — ABNORMAL HIGH (ref 12.3–15.4)
WBC: 9.8 10*3/uL (ref 3.4–10.8)

## 2016-07-12 ENCOUNTER — Encounter: Payer: Self-pay | Admitting: *Deleted

## 2016-07-12 ENCOUNTER — Other Ambulatory Visit: Payer: Self-pay | Admitting: Cardiology

## 2016-07-12 MED ORDER — WARFARIN SODIUM 5 MG PO TABS: ORAL_TABLET | ORAL | 6 refills | Status: DC

## 2016-07-12 NOTE — Telephone Encounter (Signed)
Spoke with pt this morning.  OK to restart coumadin was OK'd by Dr Raliegh Ip.  Pt will start coumadin at previous therapeutic dose of 7.5mg  daily.  RX sent to [facility].  INR appt made for 3/15.  Pt verbalized understanding.

## 2016-07-12 NOTE — Patient Instructions (Signed)
Received message from Dr Bronson Ing that CBC is stable and patient can restart warfarin.  Spoke with pt.  Start warfarin 7.5mg  daily (previous dose) and recheck on 07/22/16.  Rx sent to [facility].

## 2016-07-15 DIAGNOSIS — I4892 Unspecified atrial flutter: Secondary | ICD-10-CM | POA: Diagnosis not present

## 2016-07-15 DIAGNOSIS — E1151 Type 2 diabetes mellitus with diabetic peripheral angiopathy without gangrene: Secondary | ICD-10-CM | POA: Diagnosis not present

## 2016-07-15 DIAGNOSIS — Z950 Presence of cardiac pacemaker: Secondary | ICD-10-CM | POA: Diagnosis not present

## 2016-07-15 DIAGNOSIS — Z713 Dietary counseling and surveillance: Secondary | ICD-10-CM | POA: Diagnosis not present

## 2016-07-15 DIAGNOSIS — M79606 Pain in leg, unspecified: Secondary | ICD-10-CM | POA: Diagnosis not present

## 2016-07-15 DIAGNOSIS — I10 Essential (primary) hypertension: Secondary | ICD-10-CM | POA: Diagnosis not present

## 2016-07-15 DIAGNOSIS — I471 Supraventricular tachycardia: Secondary | ICD-10-CM | POA: Diagnosis not present

## 2016-07-15 DIAGNOSIS — Z6832 Body mass index (BMI) 32.0-32.9, adult: Secondary | ICD-10-CM | POA: Diagnosis not present

## 2016-07-15 DIAGNOSIS — E7800 Pure hypercholesterolemia, unspecified: Secondary | ICD-10-CM | POA: Diagnosis not present

## 2016-07-15 DIAGNOSIS — Z299 Encounter for prophylactic measures, unspecified: Secondary | ICD-10-CM | POA: Diagnosis not present

## 2016-07-20 DIAGNOSIS — I4891 Unspecified atrial fibrillation: Secondary | ICD-10-CM | POA: Diagnosis not present

## 2016-07-20 DIAGNOSIS — I10 Essential (primary) hypertension: Secondary | ICD-10-CM | POA: Diagnosis not present

## 2016-07-20 DIAGNOSIS — E7800 Pure hypercholesterolemia, unspecified: Secondary | ICD-10-CM | POA: Diagnosis not present

## 2016-07-22 ENCOUNTER — Ambulatory Visit (INDEPENDENT_AMBULATORY_CARE_PROVIDER_SITE_OTHER): Payer: Medicare Other | Admitting: *Deleted

## 2016-07-22 DIAGNOSIS — Z9889 Other specified postprocedural states: Secondary | ICD-10-CM | POA: Diagnosis not present

## 2016-07-22 DIAGNOSIS — Z5181 Encounter for therapeutic drug level monitoring: Secondary | ICD-10-CM | POA: Diagnosis not present

## 2016-07-22 DIAGNOSIS — I25708 Atherosclerosis of coronary artery bypass graft(s), unspecified, with other forms of angina pectoris: Secondary | ICD-10-CM | POA: Diagnosis not present

## 2016-07-22 DIAGNOSIS — I4892 Unspecified atrial flutter: Secondary | ICD-10-CM | POA: Diagnosis not present

## 2016-07-22 LAB — POCT INR: INR: 2.2

## 2016-07-26 ENCOUNTER — Ambulatory Visit (INDEPENDENT_AMBULATORY_CARE_PROVIDER_SITE_OTHER): Payer: Medicare Other | Admitting: *Deleted

## 2016-07-26 DIAGNOSIS — I4892 Unspecified atrial flutter: Secondary | ICD-10-CM | POA: Diagnosis not present

## 2016-07-26 NOTE — Progress Notes (Signed)
Remote pacemaker transmission.   

## 2016-07-27 LAB — CUP PACEART REMOTE DEVICE CHECK
Implantable Lead Location: 753859
Implantable Lead Model: 7741
Implantable Lead Model: 7742
Implantable Lead Serial Number: 733074
Implantable Pulse Generator Implant Date: 20170612
Lead Channel Impedance Value: 665 Ohm
Lead Channel Pacing Threshold Amplitude: 0.7 V
Lead Channel Pacing Threshold Amplitude: 0.8 V
Lead Channel Pacing Threshold Pulse Width: 0.4 ms
Lead Channel Pacing Threshold Pulse Width: 0.4 ms
Lead Channel Sensing Intrinsic Amplitude: 3.8 mV
MDC IDC LEAD IMPLANT DT: 20170612
MDC IDC LEAD IMPLANT DT: 20170612
MDC IDC LEAD LOCATION: 753860
MDC IDC LEAD SERIAL: 760165
MDC IDC MSMT LEADCHNL RA IMPEDANCE VALUE: 640 Ohm
MDC IDC PG SERIAL: 724121
MDC IDC SESS DTM: 20180320153719
MDC IDC STAT BRADY RA PERCENT PACED: 0 %
MDC IDC STAT BRADY RV PERCENT PACED: 97 %

## 2016-07-28 ENCOUNTER — Encounter: Payer: Self-pay | Admitting: Cardiology

## 2016-08-12 ENCOUNTER — Ambulatory Visit (INDEPENDENT_AMBULATORY_CARE_PROVIDER_SITE_OTHER): Payer: Medicare Other | Admitting: *Deleted

## 2016-08-12 DIAGNOSIS — Z299 Encounter for prophylactic measures, unspecified: Secondary | ICD-10-CM | POA: Diagnosis not present

## 2016-08-12 DIAGNOSIS — I509 Heart failure, unspecified: Secondary | ICD-10-CM | POA: Diagnosis not present

## 2016-08-12 DIAGNOSIS — I471 Supraventricular tachycardia: Secondary | ICD-10-CM | POA: Diagnosis not present

## 2016-08-12 DIAGNOSIS — Z713 Dietary counseling and surveillance: Secondary | ICD-10-CM | POA: Diagnosis not present

## 2016-08-12 DIAGNOSIS — I2510 Atherosclerotic heart disease of native coronary artery without angina pectoris: Secondary | ICD-10-CM | POA: Diagnosis not present

## 2016-08-12 DIAGNOSIS — I4892 Unspecified atrial flutter: Secondary | ICD-10-CM

## 2016-08-12 DIAGNOSIS — Z950 Presence of cardiac pacemaker: Secondary | ICD-10-CM | POA: Diagnosis not present

## 2016-08-12 DIAGNOSIS — I25708 Atherosclerosis of coronary artery bypass graft(s), unspecified, with other forms of angina pectoris: Secondary | ICD-10-CM

## 2016-08-12 DIAGNOSIS — Z6832 Body mass index (BMI) 32.0-32.9, adult: Secondary | ICD-10-CM | POA: Diagnosis not present

## 2016-08-12 DIAGNOSIS — I10 Essential (primary) hypertension: Secondary | ICD-10-CM | POA: Diagnosis not present

## 2016-08-12 DIAGNOSIS — Z5181 Encounter for therapeutic drug level monitoring: Secondary | ICD-10-CM | POA: Diagnosis not present

## 2016-08-12 DIAGNOSIS — Z87891 Personal history of nicotine dependence: Secondary | ICD-10-CM | POA: Diagnosis not present

## 2016-08-12 DIAGNOSIS — Z9889 Other specified postprocedural states: Secondary | ICD-10-CM | POA: Diagnosis not present

## 2016-08-12 DIAGNOSIS — E1151 Type 2 diabetes mellitus with diabetic peripheral angiopathy without gangrene: Secondary | ICD-10-CM | POA: Diagnosis not present

## 2016-08-12 DIAGNOSIS — E7800 Pure hypercholesterolemia, unspecified: Secondary | ICD-10-CM | POA: Diagnosis not present

## 2016-08-12 LAB — POCT INR: INR: 3.5

## 2016-08-19 DIAGNOSIS — I4891 Unspecified atrial fibrillation: Secondary | ICD-10-CM | POA: Diagnosis not present

## 2016-08-19 DIAGNOSIS — E7800 Pure hypercholesterolemia, unspecified: Secondary | ICD-10-CM | POA: Diagnosis not present

## 2016-08-19 DIAGNOSIS — I10 Essential (primary) hypertension: Secondary | ICD-10-CM | POA: Diagnosis not present

## 2016-08-26 ENCOUNTER — Ambulatory Visit (INDEPENDENT_AMBULATORY_CARE_PROVIDER_SITE_OTHER): Payer: Medicare Other | Admitting: *Deleted

## 2016-08-26 DIAGNOSIS — I25708 Atherosclerosis of coronary artery bypass graft(s), unspecified, with other forms of angina pectoris: Secondary | ICD-10-CM | POA: Diagnosis not present

## 2016-08-26 DIAGNOSIS — I4892 Unspecified atrial flutter: Secondary | ICD-10-CM

## 2016-08-26 DIAGNOSIS — Z9889 Other specified postprocedural states: Secondary | ICD-10-CM

## 2016-08-26 DIAGNOSIS — Z5181 Encounter for therapeutic drug level monitoring: Secondary | ICD-10-CM | POA: Diagnosis not present

## 2016-08-26 LAB — POCT INR: INR: 1.9

## 2016-09-08 DIAGNOSIS — E7800 Pure hypercholesterolemia, unspecified: Secondary | ICD-10-CM | POA: Diagnosis not present

## 2016-09-08 DIAGNOSIS — I4891 Unspecified atrial fibrillation: Secondary | ICD-10-CM | POA: Diagnosis not present

## 2016-09-08 DIAGNOSIS — I10 Essential (primary) hypertension: Secondary | ICD-10-CM | POA: Diagnosis not present

## 2016-09-09 ENCOUNTER — Ambulatory Visit (INDEPENDENT_AMBULATORY_CARE_PROVIDER_SITE_OTHER): Payer: Medicare Other | Admitting: *Deleted

## 2016-09-09 DIAGNOSIS — I25708 Atherosclerosis of coronary artery bypass graft(s), unspecified, with other forms of angina pectoris: Secondary | ICD-10-CM

## 2016-09-09 DIAGNOSIS — Z5181 Encounter for therapeutic drug level monitoring: Secondary | ICD-10-CM

## 2016-09-09 DIAGNOSIS — Z9889 Other specified postprocedural states: Secondary | ICD-10-CM | POA: Diagnosis not present

## 2016-09-09 DIAGNOSIS — I4892 Unspecified atrial flutter: Secondary | ICD-10-CM | POA: Diagnosis not present

## 2016-09-09 LAB — POCT INR: INR: 1.5

## 2016-09-16 ENCOUNTER — Ambulatory Visit (INDEPENDENT_AMBULATORY_CARE_PROVIDER_SITE_OTHER): Payer: Medicare Other | Admitting: *Deleted

## 2016-09-16 DIAGNOSIS — Z5181 Encounter for therapeutic drug level monitoring: Secondary | ICD-10-CM | POA: Diagnosis not present

## 2016-09-16 DIAGNOSIS — I4892 Unspecified atrial flutter: Secondary | ICD-10-CM | POA: Diagnosis not present

## 2016-09-16 DIAGNOSIS — I25708 Atherosclerosis of coronary artery bypass graft(s), unspecified, with other forms of angina pectoris: Secondary | ICD-10-CM | POA: Diagnosis not present

## 2016-09-16 LAB — POCT INR: INR: 2.5

## 2016-09-23 ENCOUNTER — Telehealth: Payer: Self-pay | Admitting: Cardiovascular Disease

## 2016-09-23 NOTE — Telephone Encounter (Signed)
Patient has bought the [place] and would like to know if he will be able to do this diet

## 2016-09-23 NOTE — Telephone Encounter (Signed)
Fine

## 2016-09-23 NOTE — Telephone Encounter (Signed)
Message fwd to provider for answer.

## 2016-09-23 NOTE — Telephone Encounter (Signed)
Patient notified

## 2016-09-30 ENCOUNTER — Ambulatory Visit (INDEPENDENT_AMBULATORY_CARE_PROVIDER_SITE_OTHER): Payer: Medicare Other | Admitting: *Deleted

## 2016-09-30 DIAGNOSIS — I25708 Atherosclerosis of coronary artery bypass graft(s), unspecified, with other forms of angina pectoris: Secondary | ICD-10-CM

## 2016-09-30 DIAGNOSIS — I4892 Unspecified atrial flutter: Secondary | ICD-10-CM

## 2016-09-30 DIAGNOSIS — Z5181 Encounter for therapeutic drug level monitoring: Secondary | ICD-10-CM | POA: Diagnosis not present

## 2016-09-30 DIAGNOSIS — Z9889 Other specified postprocedural states: Secondary | ICD-10-CM | POA: Diagnosis not present

## 2016-09-30 LAB — POCT INR: INR: 2.4

## 2016-10-21 ENCOUNTER — Ambulatory Visit (INDEPENDENT_AMBULATORY_CARE_PROVIDER_SITE_OTHER): Payer: Medicare Other | Admitting: *Deleted

## 2016-10-21 DIAGNOSIS — Z9889 Other specified postprocedural states: Secondary | ICD-10-CM | POA: Diagnosis not present

## 2016-10-21 DIAGNOSIS — Z5181 Encounter for therapeutic drug level monitoring: Secondary | ICD-10-CM | POA: Diagnosis not present

## 2016-10-21 DIAGNOSIS — I25708 Atherosclerosis of coronary artery bypass graft(s), unspecified, with other forms of angina pectoris: Secondary | ICD-10-CM

## 2016-10-21 DIAGNOSIS — I4892 Unspecified atrial flutter: Secondary | ICD-10-CM | POA: Diagnosis not present

## 2016-10-21 LAB — POCT INR: INR: 2.7

## 2016-10-25 ENCOUNTER — Ambulatory Visit (INDEPENDENT_AMBULATORY_CARE_PROVIDER_SITE_OTHER): Payer: Medicare Other | Admitting: Thoracic Surgery (Cardiothoracic Vascular Surgery)

## 2016-10-25 ENCOUNTER — Encounter: Payer: Self-pay | Admitting: Thoracic Surgery (Cardiothoracic Vascular Surgery)

## 2016-10-25 ENCOUNTER — Ambulatory Visit (INDEPENDENT_AMBULATORY_CARE_PROVIDER_SITE_OTHER): Payer: Medicare Other | Admitting: *Deleted

## 2016-10-25 VITALS — BP 124/78 | HR 85 | Resp 16 | Ht 71.0 in | Wt 222.0 lb

## 2016-10-25 DIAGNOSIS — I4891 Unspecified atrial fibrillation: Secondary | ICD-10-CM | POA: Diagnosis not present

## 2016-10-25 DIAGNOSIS — I340 Nonrheumatic mitral (valve) insufficiency: Secondary | ICD-10-CM | POA: Diagnosis not present

## 2016-10-25 DIAGNOSIS — Z951 Presence of aortocoronary bypass graft: Secondary | ICD-10-CM

## 2016-10-25 DIAGNOSIS — I25708 Atherosclerosis of coronary artery bypass graft(s), unspecified, with other forms of angina pectoris: Secondary | ICD-10-CM

## 2016-10-25 DIAGNOSIS — I350 Nonrheumatic aortic (valve) stenosis: Secondary | ICD-10-CM | POA: Diagnosis not present

## 2016-10-25 DIAGNOSIS — Z9889 Other specified postprocedural states: Secondary | ICD-10-CM

## 2016-10-25 NOTE — Progress Notes (Signed)
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 Postoperatively the patient required permanent pacemaker placement secondary to the development of complete heart block. Early in his postoperative course he was also noted to have some atrial flutter underneath his pacemaker. He was last seen in follow-up in our office on 01/26/2016 at which time he was doing well. In early November 2017 the patient was hospitalized briefly with acute exacerbation of chronic diastolic congestive heart failure which developed after the patient stop taking oral Lasix. Transthoracic echocardiogram performed at that time revealed mild aortic stenosis, intact mitral valve repair with no significant residual mitral regurgitation and left ventricular ejection fraction estimated 40-50%. Shortly after that he was seen in follow-up by Dr. Bronson Ing on 03/26/2016.  He had a limited echocardiogram performed 04/16/2016 revealing left ventricular ejection fraction estimated 45-50%. Valve function was not reassessed at that time.  Coumadin was stopped but subsequent interrogation of his pacemaker has apparently demonstrated the presence of occasional episodes of atrial flutter, and Coumadin was resumed for stroke prophylaxis. The patient returns to our office today for routine follow-up. He reports that he is doing quite well.  He  admits that he is not very active physically but he denies any problems with exertional shortness of breath with ordinary activities. He states that he only gets short of breath if he really pushes himself, which he admits that he never does. He states that his diabetes has been under good control and overall he feels quite well. He never has any chest pain or chest tightness either with activity or at rest.   Current Outpatient Prescriptions  Medication Sig Dispense Refill  . amLODipine (NORVASC) 10 MG tablet Take 1 tablet (10 mg total) by mouth daily. 90 tablet 3  . aspirin EC 81 MG tablet Take 1 tablet (81 mg total) by mouth daily. 90 tablet 3  . ASTAXANTHIN PO Take 10 mg by mouth daily. Reported on 10/30/2015    . atorvastatin (LIPITOR) 40 MG tablet Take 1 tablet by mouth daily. Reported on 11/24/2015    . carvedilol (COREG) 6.25 MG tablet Take 2 tablets (12.5 mg total) by mouth 2 (two) times daily. 360 tablet 3  . furosemide (LASIX) 40 MG tablet Take 40 mg by mouth 2 (two) times daily.     Marland Kitchen lisinopril (PRINIVIL,ZESTRIL) 20 MG tablet Take 1 tablet (20 mg total) by mouth daily. 90 tablet 3  . metFORMIN (GLUCOPHAGE) 500 MG tablet Take 1,000 mg by mouth 2 (two) times daily with a meal. Reported on 10/30/2015  2  . potassium chloride (K-DUR) 10 MEQ tablet Take 1 tablet (10 mEq total) by mouth daily. 90 tablet 3  . warfarin (COUMADIN) 5 MG tablet Take 1 1/2 tablets daily 45 tablet 6   No current facility-administered medications for this visit.       Physical Exam:  BP 124/78 (BP Location: Right Arm, Patient Position: Sitting, Cuff Size: Large)   Pulse 85   Resp 16   Ht 5\' 11"  (1.803 m)   Wt 222 lb (100.7 kg)   SpO2 95% Comment: RA  BMI 30.96 kg/m   General:  Well-appearing  Chest:   Clear to auscultation  CV:   Regular rate and rhythm without murmur  Incisions:  Completely healed  Abdomen:  Soft nontender  Extremities:  Warm and well-perfused  Diagnostic Tests:  Transthoracic  Echocardiography  Patient:    Braddock, Servellon MR #:       [id number] Study Date: 04/08/2016 Gender:     M Age:        22 Height:     180.3 cm Weight:     103.9 kg BSA:        2.31 m^2 Pt. Status: Room:   SONOGRAPHER  [facility]  ATTENDING    Kate Sable, MD  [place], MD  [place], MD  PERFORMING   [facility] [place]  cc:  ------------------------------------------------------------------- LV EF: 40% -   50%  ------------------------------------------------------------------- History:   PMH:   Atrial flutter.  Coronary artery disease. RBBB. Risk factors:  Hypertension. Dyslipidemia.  ------------------------------------------------------------------- Study Conclusions  - Left ventricle: The cavity size was normal. Wall thickness was   increased in a pattern of mild LVH. Systolic function was mildly   reduced. The estimated ejection fraction was in the range of 40%   to 50%. Difficult LVEF assessment, endocardium poorly visualized.   Likely low normal to mildly decreased. Consider limited study   with echocontrast to more precisely describe. Unable to assess   diastolic function in setting of MV ring. - Aortic valve: There was mild stenosis. There was mild   regurgitation. Mean gradient (S): 13 mm Hg. Valve area (VTI):   1.54 cm^2. - Mitral valve: 28 mm Sorin Memo 3D ring annuloplasty is present.   There is a moderate gradient across the MV, mean gradient 6 mmHg.   Mean gradient (D): 6 mm Hg. - Left atrium: The atrium was mildly dilated. - Atrial septum: No defect or patent foramen ovale was identified. - Pulmonary arteries: Systolic pressure was moderately increased.   PA peak pressure: 44 mm Hg (S). - Technically difficult study. Consider echocontrast limited study   to better clarify LVEF.  ------------------------------------------------------------------- Labs, prior tests, procedures, and surgery: Ablation.   Echocardiography (June 2017).     EF was 55%. Aortic valve: peak gradient of 28 mm Hg. Permanent pacemaker system implantation. Valve surgery.     Mitral annuloplasty.  ------------------------------------------------------------------- Study data:  Comparison was made to the study of June 2017.  Study status:  Routine.  Procedure:  The patient reported no pain pre or post test. Transthoracic echocardiography. Image quality was fair. The study was technically difficult, as a result of poor acoustic windows and body habitus.  Study completion:  There were no complications.          Transthoracic echocardiography.  M-mode, complete 2D, spectral Doppler, and color Doppler.  Birthdate: Patient birthdate: 1941-03-17.  Age:  Patient is 76 yr old.  Sex: Gender: male.    BMI: 32 kg/m^2.  Blood pressure:     24/78 Patient status:  Outpatient.  Study date:  Study date: 04/08/2016. Study time: 09:50 AM.  -------------------------------------------------------------------  ------------------------------------------------------------------- Left ventricle:  The cavity size was normal. Wall thickness was increased in a pattern of mild  LVH. Systolic function was mildly reduced. The estimated ejection fraction was in the range of 40% to 50%. Difficult LVEF assessment, endocardium poorly visualized. Likely low normal to mildly decreased. Consider limited study with echocontrast to more precisely describe. Images were inadequate for LV wall motion assessment. Unable to assess diastolic function in setting of MV ring.  ------------------------------------------------------------------- Aortic valve:  Not well visualized. Uncertain number of leaflets. Doppler:   There was mild stenosis.   There was mild regurgitation.    VTI ratio of LVOT to aortic valve: 0.49. Valve area (VTI): 1.54 cm^2. Indexed valve area (VTI): 0.67 cm^2/m^2. Peak velocity ratio of LVOT to aortic valve: 0.47.    Mean gradient  (S): 13 mm Hg. Peak gradient (S): 22 mm Hg.  ------------------------------------------------------------------- Aorta:  Aortic root: The aortic root was normal in size.  ------------------------------------------------------------------- Mitral valve:  28 mm Sorin Memo 3D ring annuloplasty is present. Not well visualized.  Doppler:  There was no significant regurgitation. There is a moderate gradient across the MV, mean gradient 6 mmHg.    Mean gradient (D): 6 mm Hg. Peak gradient (D): 16 mm Hg.  ------------------------------------------------------------------- Left atrium:  The atrium was mildly dilated.  ------------------------------------------------------------------- Atrial septum:  No defect or patent foramen ovale was identified.   ------------------------------------------------------------------- Right ventricle:  The cavity size was normal. Wall thickness was normal. Systolic function was low normal.  ------------------------------------------------------------------- Pulmonic valve:   Not well visualized.  ------------------------------------------------------------------- Tricuspid valve:   Normal thickness leaflets.  Doppler:   There was no evidence for stenosis.   There was mild regurgitation.  ------------------------------------------------------------------- Pulmonary artery:   Systolic pressure was moderately increased.  ------------------------------------------------------------------- Right atrium:  The atrium was normal in size.  ------------------------------------------------------------------- Pericardium:  There was no pericardial effusion.  ------------------------------------------------------------------- Systemic veins: Inferior vena cava: The vessel was normal in size. The respirophasic diameter changes were in the normal range (>= 50%), consistent with normal central venous  pressure.  ------------------------------------------------------------------- Measurements   Left ventricle                         Value           Reference  LV ID, ED, PLAX chordal                48.6   mm       43 - 52  LV ID, ES, PLAX chordal                34.3   mm       23 - 38  LV fx shortening, PLAX chordal         29     %        >=29  LV PW thickness, ED                    11     mm       ---------  IVS/LV PW ratio, ED                    1               <=1.3  LV ejection time                       270    ms       ---------    Ventricular septum  Value           Reference  IVS thickness, ED                      11     mm       ---------    LVOT                                   Value           Reference  LVOT ID, S                             20     mm       ---------  LVOT area                              3.14   cm^2     ---------  LVOT peak velocity, S                  110    cm/s     ---------  LVOT mean velocity, S                  71     cm/s     ---------  LVOT VTI, S                            22.4   cm       ---------    Aortic valve                           Value           Reference  Aortic valve peak velocity, S          233.71 cm/s     ---------  Aortic valve mean velocity, S          173.23 cm/s     ---------  Aortic valve VTI, S                    45.75  cm       ---------  Aortic mean gradient, S                13     mm Hg    ---------  Aortic peak gradient, S                22     mm Hg    ---------  VTI ratio, LVOT/AV                     0.49            ---------  Aortic valve area, VTI                 1.54   cm^2     ---------  Aortic valve area/bsa, VTI             0.67   cm^2/m^2 ---------  Velocity ratio, peak, LVOT/AV          0.47            ---------    Aorta  Value           Reference  Aortic root ID, ED                     34     mm       ---------    Left atrium                             Value           Reference  LA ID, A-P, ES                         32     mm       ---------  LA volume/bsa, S                       30     ml/m^2   ---------    Mitral valve                           Value           Reference  Mitral E-wave peak velocity            198    cm/s     ---------  Mitral A-wave peak velocity            151    cm/s     ---------  Mitral mean velocity, D                97.9   cm/s     ---------  Mitral pressure half-time              211    ms       ---------  Mitral mean gradient, D                6      mm Hg    ---------  Mitral peak gradient, D                16     mm Hg    ---------  Mitral E/A ratio, peak                 1.3             ---------    Pulmonary arteries                     Value           Reference  PA pressure, S, DP             (H)     44     mm Hg    <=30    Tricuspid valve                        Value           Reference  Tricuspid regurg peak velocity         319    cm/s     ---------  Tricuspid peak RV-RA gradient          41     mm Hg    ---------    Right ventricle  Value           Reference  TAPSE                                  16     mm       ---------  Legend: (L)  and  (H)  mark values outside specified reference range.  ------------------------------------------------------------------- Prepared and Electronically Authenticated by  Kerry Hough, M.D. 2017-11-30T15:02:47   Transthoracic Echocardiography  Patient:    Royer, Cristobal MR #:       [id number] Study Date: 04/16/2016 Gender:     M Age:        75 Height: Weight: BSA: Pt. Status: Room:   SONOGRAPHER  [facility]  ATTENDING    Kate Sable, MD  [place], MD  [place], MD  PERFORMING   [facility]  cc:  ------------------------------------------------------------------- LV EF: 45% -    50%  ------------------------------------------------------------------- Indications:      CHF - 428.0.  ------------------------------------------------------------------- History:   PMH:  Mitral Valve Repair, RBBB.  Atrial fibrillation. Atrial flutter.  Coronary artery disease.  Coronary artery disease.  PMH:   Myocardial infarction.  Risk factors:  Dyslipidemia.  ------------------------------------------------------------------- Study Conclusions  - Limited study with echocontrast to evaluate LV systolic function. - Left ventricle: Systolic function was mildly reduced. The   estimated ejection fraction was in the range of 45% to 50%. - Septal motion is consistent with paced rhythm.  ------------------------------------------------------------------- Labs, prior tests, procedures, and surgery: Coronary artery bypass grafting.  ------------------------------------------------------------------- Study data:  Comparison was made to the study of 04/08/2016.  Study status:  Routine.  Procedure:  Transthoracic echocardiography. Image quality was adequate. Intravenous contrast (Definity) was administered.          Transthoracic echocardiography.  M-mode, limited 2D, limited spectral Doppler, and color Doppler. Birthdate:  Patient birthdate: 04-Nov-1940.  Age:  Patient is 76 yr old.  Sex:  Gender: male.  Blood pressure:     135/80  Patient status:  Outpatient.  Study date:  Study date: 04/16/2016. Study time: 09:20 AM.  Location:  Echo laboratory.  -------------------------------------------------------------------  ------------------------------------------------------------------- Left ventricle:  Systolic function was mildly reduced. The estimated ejection fraction was in the range of 45% to 50%.   ------------------------------------------------------------------- Prepared and Electronically Authenticated by  Kerry Hough,  M.D. 2017-12-08T11:59:44   Impression:  Patient is doing very well approximately one year status post mitral valve repair and coronary artery bypass grafting.  Plan:  We have not recommended any changes to the patient's current medications.  The patient has been reminded regarding the many benefits of regular exercise, heart healthy diet, and strict control of diabetes.  The patient has also been reminded regarding the importance of dental hygiene and the lifelong need for antibiotic prophylaxis for all dental cleanings and other related invasive procedures.  In the future he will call and return to see [place] only should specific problems or questions arise.  I spent in excess of 15 minutes during the conduct of this office consultation and >50% of this time involved direct face-to-face encounter with the patient for counseling and/or coordination of their care.    Valentina Gu. Roxy Manns, MD 10/25/2016 11:16 AM

## 2016-10-25 NOTE — Progress Notes (Signed)
Remote pacemaker transmission.   

## 2016-10-25 NOTE — Patient Instructions (Addendum)
Continue all previous medications without any changes at this time  You may resume unrestricted physical activity without any particular limitations at this time.  Make every effort to keep your diabetes under very tight control.  Follow up closely with your primary care physician or endocrinologist and strive to keep their hemoglobin A1c levels as low as possible, preferably near or below 6.0.  The long term benefits of strict control of diabetes are far reaching and critically important for your overall health and survival.  Make every effort to stay physically active, get some type of exercise on a regular basis, and stick to a "heart healthy diet".  The long term benefits for regular exercise and a healthy diet are critically important to your overall health and wellbeing.  Endocarditis is a potentially serious infection of heart valves or inside lining of the heart.  It occurs more commonly in patients with diseased heart valves (such as patient's with aortic or mitral valve disease) and in patients who have undergone heart valve repair or replacement.  Certain surgical and dental procedures may put you at risk, such as dental cleaning, other dental procedures, or any surgery involving the respiratory, urinary, gastrointestinal tract, gallbladder or prostate gland.   To minimize your chances for develooping endocarditis, maintain good oral health and seek prompt medical attention for any infections involving the mouth, teeth, gums, skin or urinary tract.    Always notify your doctor or dentist about your underlying heart valve condition before having any invasive procedures. You will need to take antibiotics before certain procedures, including all routine dental cleanings or other dental procedures.  Your cardiologist or dentist should prescribe these antibiotics for you to be taken ahead of time.

## 2016-10-26 LAB — CUP PACEART REMOTE DEVICE CHECK
Battery Remaining Longevity: 144 mo
Implantable Lead Implant Date: 20170612
Implantable Lead Location: 753860
Implantable Lead Model: 7741
Implantable Lead Model: 7742
Implantable Lead Serial Number: 760165
Implantable Pulse Generator Implant Date: 20170612
Lead Channel Impedance Value: 687 Ohm
Lead Channel Impedance Value: 730 Ohm
Lead Channel Pacing Threshold Amplitude: 0.9 V
Lead Channel Pacing Threshold Pulse Width: 0.4 ms
Lead Channel Setting Pacing Amplitude: 1.3 V
Lead Channel Setting Pacing Amplitude: 3.5 V
Lead Channel Setting Sensing Sensitivity: 2.5 mV
MDC IDC LEAD IMPLANT DT: 20170612
MDC IDC LEAD LOCATION: 753859
MDC IDC LEAD SERIAL: 733074
MDC IDC MSMT BATTERY REMAINING PERCENTAGE: 100 %
MDC IDC MSMT LEADCHNL RA PACING THRESHOLD AMPLITUDE: 0.7 V
MDC IDC MSMT LEADCHNL RA PACING THRESHOLD PULSEWIDTH: 0.4 ms
MDC IDC PG SERIAL: 724121
MDC IDC SESS DTM: 20180618084700
MDC IDC SET LEADCHNL RV PACING PULSEWIDTH: 0.4 ms
MDC IDC STAT BRADY RA PERCENT PACED: 0 %
MDC IDC STAT BRADY RV PERCENT PACED: 98 %

## 2016-10-28 ENCOUNTER — Encounter: Payer: Self-pay | Admitting: Cardiology

## 2016-11-08 ENCOUNTER — Telehealth: Payer: Self-pay | Admitting: Cardiology

## 2016-11-08 NOTE — Telephone Encounter (Signed)
Patient called and wanted to know what he needed to do if he felt dizzy. Instructed pt to send a manual transmission w/ his home monitor when he becomes dizzy. Pt verbalized understanding.

## 2016-11-12 DIAGNOSIS — I509 Heart failure, unspecified: Secondary | ICD-10-CM | POA: Diagnosis not present

## 2016-11-12 DIAGNOSIS — Z950 Presence of cardiac pacemaker: Secondary | ICD-10-CM | POA: Diagnosis not present

## 2016-11-12 DIAGNOSIS — I2510 Atherosclerotic heart disease of native coronary artery without angina pectoris: Secondary | ICD-10-CM | POA: Diagnosis not present

## 2016-11-12 DIAGNOSIS — Z299 Encounter for prophylactic measures, unspecified: Secondary | ICD-10-CM | POA: Diagnosis not present

## 2016-11-12 DIAGNOSIS — E1165 Type 2 diabetes mellitus with hyperglycemia: Secondary | ICD-10-CM | POA: Diagnosis not present

## 2016-11-12 DIAGNOSIS — Z6832 Body mass index (BMI) 32.0-32.9, adult: Secondary | ICD-10-CM | POA: Diagnosis not present

## 2016-11-12 DIAGNOSIS — I10 Essential (primary) hypertension: Secondary | ICD-10-CM | POA: Diagnosis not present

## 2016-11-12 DIAGNOSIS — E7800 Pure hypercholesterolemia, unspecified: Secondary | ICD-10-CM | POA: Diagnosis not present

## 2016-11-12 DIAGNOSIS — E1151 Type 2 diabetes mellitus with diabetic peripheral angiopathy without gangrene: Secondary | ICD-10-CM | POA: Diagnosis not present

## 2016-11-12 DIAGNOSIS — I4892 Unspecified atrial flutter: Secondary | ICD-10-CM | POA: Diagnosis not present

## 2016-11-12 DIAGNOSIS — I471 Supraventricular tachycardia: Secondary | ICD-10-CM | POA: Diagnosis not present

## 2016-11-18 ENCOUNTER — Ambulatory Visit (INDEPENDENT_AMBULATORY_CARE_PROVIDER_SITE_OTHER): Payer: Medicare Other | Admitting: *Deleted

## 2016-11-18 DIAGNOSIS — L578 Other skin changes due to chronic exposure to nonionizing radiation: Secondary | ICD-10-CM | POA: Diagnosis not present

## 2016-11-18 DIAGNOSIS — I4892 Unspecified atrial flutter: Secondary | ICD-10-CM

## 2016-11-18 DIAGNOSIS — I25708 Atherosclerosis of coronary artery bypass graft(s), unspecified, with other forms of angina pectoris: Secondary | ICD-10-CM | POA: Diagnosis not present

## 2016-11-18 DIAGNOSIS — L430 Hypertrophic lichen planus: Secondary | ICD-10-CM | POA: Diagnosis not present

## 2016-11-18 DIAGNOSIS — D485 Neoplasm of uncertain behavior of skin: Secondary | ICD-10-CM | POA: Diagnosis not present

## 2016-11-18 DIAGNOSIS — Z5181 Encounter for therapeutic drug level monitoring: Secondary | ICD-10-CM

## 2016-11-18 DIAGNOSIS — Z9889 Other specified postprocedural states: Secondary | ICD-10-CM | POA: Diagnosis not present

## 2016-11-18 DIAGNOSIS — L814 Other melanin hyperpigmentation: Secondary | ICD-10-CM | POA: Diagnosis not present

## 2016-11-18 DIAGNOSIS — D225 Melanocytic nevi of trunk: Secondary | ICD-10-CM | POA: Diagnosis not present

## 2016-11-18 DIAGNOSIS — L821 Other seborrheic keratosis: Secondary | ICD-10-CM | POA: Diagnosis not present

## 2016-11-18 DIAGNOSIS — L570 Actinic keratosis: Secondary | ICD-10-CM | POA: Diagnosis not present

## 2016-11-18 DIAGNOSIS — D1801 Hemangioma of skin and subcutaneous tissue: Secondary | ICD-10-CM | POA: Diagnosis not present

## 2016-11-18 LAB — POCT INR: INR: 3

## 2016-12-09 ENCOUNTER — Other Ambulatory Visit: Payer: Self-pay | Admitting: *Deleted

## 2016-12-09 DIAGNOSIS — I4892 Unspecified atrial flutter: Secondary | ICD-10-CM

## 2016-12-09 DIAGNOSIS — Z5181 Encounter for therapeutic drug level monitoring: Secondary | ICD-10-CM

## 2016-12-16 ENCOUNTER — Ambulatory Visit (INDEPENDENT_AMBULATORY_CARE_PROVIDER_SITE_OTHER): Payer: Medicare Other | Admitting: *Deleted

## 2016-12-16 DIAGNOSIS — I4892 Unspecified atrial flutter: Secondary | ICD-10-CM | POA: Diagnosis not present

## 2016-12-16 DIAGNOSIS — Z5181 Encounter for therapeutic drug level monitoring: Secondary | ICD-10-CM

## 2016-12-16 DIAGNOSIS — I25708 Atherosclerosis of coronary artery bypass graft(s), unspecified, with other forms of angina pectoris: Secondary | ICD-10-CM | POA: Diagnosis not present

## 2016-12-16 LAB — POCT INR: INR: 3.2

## 2016-12-21 DIAGNOSIS — I4891 Unspecified atrial fibrillation: Secondary | ICD-10-CM | POA: Diagnosis not present

## 2016-12-21 DIAGNOSIS — E7800 Pure hypercholesterolemia, unspecified: Secondary | ICD-10-CM | POA: Diagnosis not present

## 2016-12-21 DIAGNOSIS — I10 Essential (primary) hypertension: Secondary | ICD-10-CM | POA: Diagnosis not present

## 2016-12-30 ENCOUNTER — Ambulatory Visit (INDEPENDENT_AMBULATORY_CARE_PROVIDER_SITE_OTHER): Payer: Medicare Other | Admitting: *Deleted

## 2016-12-30 DIAGNOSIS — I25708 Atherosclerosis of coronary artery bypass graft(s), unspecified, with other forms of angina pectoris: Secondary | ICD-10-CM

## 2016-12-30 DIAGNOSIS — Z5181 Encounter for therapeutic drug level monitoring: Secondary | ICD-10-CM | POA: Diagnosis not present

## 2016-12-30 DIAGNOSIS — I4892 Unspecified atrial flutter: Secondary | ICD-10-CM | POA: Diagnosis not present

## 2016-12-30 LAB — POCT INR: INR: 3.4

## 2017-01-06 ENCOUNTER — Ambulatory Visit (INDEPENDENT_AMBULATORY_CARE_PROVIDER_SITE_OTHER): Payer: Medicare Other | Admitting: Cardiovascular Disease

## 2017-01-06 ENCOUNTER — Encounter: Payer: Self-pay | Admitting: Cardiovascular Disease

## 2017-01-06 VITALS — BP 120/60 | HR 82 | Ht 71.0 in | Wt 232.0 lb

## 2017-01-06 DIAGNOSIS — I209 Angina pectoris, unspecified: Secondary | ICD-10-CM

## 2017-01-06 DIAGNOSIS — I5042 Chronic combined systolic (congestive) and diastolic (congestive) heart failure: Secondary | ICD-10-CM | POA: Diagnosis not present

## 2017-01-06 DIAGNOSIS — I10 Essential (primary) hypertension: Secondary | ICD-10-CM | POA: Diagnosis not present

## 2017-01-06 DIAGNOSIS — I350 Nonrheumatic aortic (valve) stenosis: Secondary | ICD-10-CM

## 2017-01-06 DIAGNOSIS — I4892 Unspecified atrial flutter: Secondary | ICD-10-CM | POA: Diagnosis not present

## 2017-01-06 DIAGNOSIS — Z950 Presence of cardiac pacemaker: Secondary | ICD-10-CM | POA: Diagnosis not present

## 2017-01-06 DIAGNOSIS — I4891 Unspecified atrial fibrillation: Secondary | ICD-10-CM

## 2017-01-06 DIAGNOSIS — Z9889 Other specified postprocedural states: Secondary | ICD-10-CM | POA: Diagnosis not present

## 2017-01-06 DIAGNOSIS — I25708 Atherosclerosis of coronary artery bypass graft(s), unspecified, with other forms of angina pectoris: Secondary | ICD-10-CM

## 2017-01-06 DIAGNOSIS — Z951 Presence of aortocoronary bypass graft: Secondary | ICD-10-CM

## 2017-01-06 DIAGNOSIS — Z5181 Encounter for therapeutic drug level monitoring: Secondary | ICD-10-CM

## 2017-01-06 NOTE — Patient Instructions (Signed)

## 2017-01-06 NOTE — Progress Notes (Signed)
SUBJECTIVE: Mr. Grau returns for follow up for CABG, chronic systolic and diastolic heart failure, and mitral valve repair. 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 Lovena Le.  Echocardiogram showed mildly reduced left ventricular systolic function, LVEF 67-61%. There was mild aortic stenosis and regurgitation. There was a moderate gradient of 6 mmHg across the mitral valve with annuloplasty ring present.  He denies chest pain. He has occasional shortness of breath. He said he does not exercise at all.  He has had a traumatic year. There are only daughter committed suicide on May 25. Her 78 year old son lives with he and his wife.  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   Review of Systems: As per "subjective", otherwise negative.  No Known Allergies  Current Outpatient Prescriptions  Medication Sig Dispense Refill  . amLODipine (NORVASC) 10 MG tablet Take 1 tablet (10 mg total) by mouth daily. 90 tablet 3  . aspirin EC 81 MG tablet Take 1 tablet (81 mg total) by mouth daily. 90 tablet 3  . ASTAXANTHIN PO Take 10 mg by mouth daily. Reported on 10/30/2015    . atorvastatin (LIPITOR) 40 MG tablet Take 1 tablet by mouth daily. Reported on 11/24/2015    . carvedilol (COREG) 6.25 MG tablet Take 2 tablets (12.5 mg total) by mouth 2 (two) times daily. 360 tablet 3  . co-enzyme Q-10 30 MG capsule Take 100 mg by mouth 3 (three) times daily.    . cyanocobalamin 1000 MCG tablet Take 1,000 mcg by mouth daily.    . furosemide (LASIX) 40 MG tablet Take 40 mg by mouth 2 (two) times daily.     Marland Kitchen lisinopril (PRINIVIL,ZESTRIL) 20 MG tablet Take 1 tablet (20 mg total) by mouth daily. 90 tablet 3  . metFORMIN (GLUCOPHAGE) 500 MG tablet Take 1,000 mg by mouth 2 (two) times daily with a meal. Reported on 10/30/2015  2  . potassium chloride (K-DUR) 10 MEQ tablet Take 1 tablet (10 mEq total) by mouth daily. 90 tablet 3  . warfarin (COUMADIN) 5 MG tablet Take 1 1/2 tablets  daily 45 tablet 6   No current facility-administered medications for this visit.     Past Medical History:  Diagnosis Date  . 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 Aortic stenosis 10/14/2015  . CAD (coronary artery disease)    status post Taxus stent patency of RCA 2004, Cardiolite negative for ischemia in 2010.  . Diabetes mellitus ([place])   . Dyslipidemia   . History of kidney stones   . Hypertension   . Mitral regurgitation 10/15/2015   Moderate-severe by intra-operative TEE  . Overweight   . Right bundle branch block (RBBB) with left anterior hemiblock   . 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 Snores   . Typical atrial flutter [facility])    s/p ablation 11-23-2012 by Dr Rayann Heman    Past Surgical History:  Procedure Laterality Date  . ABLATION  11-23-2012   s/p cavotricuspid isthmus ablation by Dr Rayann Heman  . ANKLE SURGERY Left    total ankle replacement  . ATRIAL FLUTTER ABLATION N/A 11/23/2012   Procedure: ATRIAL  FLUTTER ABLATION;  Surgeon: Thompson Grayer, MD;  Location: [facility] CATH LAB;  Service: Cardiovascular;  Laterality: N/A;  . CARDIAC CATHETERIZATION N/A 10/13/2015   Procedure: Left Heart Cath and Coronary Angiography;  Surgeon: Peter M Martinique, MD;  Location: [place] CV LAB;  Service: Cardiovascular;  Laterality: N/A;  . CATARACT EXTRACTION Right   . CATARACT EXTRACTION W/PHACO Left 09/13/2013   Procedure: CATARACT EXTRACTION PHACO AND INTRAOCULAR LENS PLACEMENT (IOC);  Surgeon: Tonny Branch, MD;  Location: [facility] ORS;  Service: Ophthalmology;  Laterality: Left;  CDE 9.38  . COLONOSCOPY N/A 10/10/2014   Procedure: COLONOSCOPY;  Surgeon: Rogene Houston, MD;   Location: [facility] ENDO SU[facility];  Service: Endoscopy;  Laterality: N/A;  830  . CORONARY ANGIOPLASTY     3 stents  . CORONARY ARTERY BYPASS GRAFT N/A 10/15/2015   Procedure: CORONARY ARTERY BYPASS GRAFTING (CABG) X4 LIMA-LAD; SEQ SVG-PD-PL; SVG-OM1 ENDOSCOPIC GREATER SAPHENOUS VEIN HARVEST(EVH) BILAT LOWER EXTREM;  Surgeon: Rexene Alberts, MD;  Location: [place];  Service: Open Heart Surgery;  Laterality: N/A;  . EP IMPLANTABLE DEVICE N/A 10/20/2015   Procedure: Pacemaker Implant;  Surgeon: Evans Lance, MD;  Location: [place] CV LAB;  Service: Cardiovascular;  Laterality: N/A;  . MITRAL VALVE REPAIR N/A 10/15/2015   Procedure: MITRAL VALVE REPAIR (MVR), # 28 MEMO 3-D RING ANNULOPLASTY AND COMPLEX VALVE REPAIR;  Surgeon: Rexene Alberts, MD;  Location: [place];  Service: Open Heart Surgery;  Laterality: N/A;  . TEE WITHOUT CARDIOVERSION N/A 10/15/2015   Procedure: TRANSESOPHAGEAL ECHOCARDIOGRAM (TEE);  Surgeon: Rexene Alberts, MD;  Location: [place];  Service: Open Heart Surgery;  Laterality: N/A;    Social History   Social History  . Marital status: Married    Spouse name: N/A  . Number of children: N/A  . Years of education: N/A   Occupational History  . 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 Smoking status: Former Smoker    Packs/day: 1.00    Years: 28.00    Types: Cigarettes    Start date: 10/21/1956    Quit date: 05/10/1985  . Smokeless tobacco: Never Used  . Alcohol use 0.0 oz/week     Comment: occasional wine  . Drug use: No  . Sexual activity: Yes    Birth control/ protection: None   Other Topics Concern  . Not on file   Social History Narrative   Lives in [place] with spouse.   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 Neck: No JVD, no thyromegaly. Lungs: Clear to auscultation bilaterally with normal respiratory effort. CV: Nondisplaced PMI.  Regular rate and rhythm, normal S1/S2, no S3/S4, I/VI early systolic murmur over RUSB. Trace right perinankleedema.  Abdomen: Soft, nontender, obese, no distention.  Neurologic: Alert and oriented.  Psych: Normal affect. Skin: Normal. Musculoskeletal: No gross deformities.    ECG: Most recent ECG reviewed.   Labs: Lab Results  Component Value Date/Time   K 4.3 04/28/2016 09:58 PM   BUN 32 (H) 04/28/2016 09:58 PM   CREATININE 1.34 (H) 04/28/2016 09:58 PM   ALT 17 04/28/2016  09:58 PM   TSH 4.924 (H) 03/18/2016 09:18 PM   HGB 11.8 (L) 07/08/2016 10:41 AM     Lipids: Lab Results  Component Value Date/Time   LDLCALC 43 10/14/2015 04:31 AM   CHOL 108 10/14/2015 04:31 AM   TRIG 157 (H) 10/14/2015 04:31 AM   HDL 34 (L) 10/14/2015 04:31 AM       ASSESSMENT AND PLAN:  1. CAD s/p 4-v CABG: Continue aspirin, Lipitor, Coreg, and lisinopril.   2. S/p mitral valve repair: Moderate gradient of 6 mmHg. Will monitor.  3. S/p PPM for CHB: Stable. F/u with Dr. Lovena Le.  4. Essential HTN: Controlled. No changes.  5. Hyperlipidemia: continue Lipitor.  6. Chronic systolic and diastolic heart failure, EF 45-50%: Euvolemic. Continue Lasix 40 mg daily. He has been instructed to weigh himself daily. Should he develop leg swelling, shortness of breath, or weight gain of 3 pounds within 24 hours, he can take an extra 40 mg.  7. Atrial flutter: Anticoagulated withwarfarin.  8. Aortic stenosis and regurgitation: Mild. Will monitor.     Disposition: Follow up 6 months.   Kate Sable, M.D., F.A.C.C.

## 2017-01-13 ENCOUNTER — Ambulatory Visit (INDEPENDENT_AMBULATORY_CARE_PROVIDER_SITE_OTHER): Payer: Medicare Other | Admitting: *Deleted

## 2017-01-13 DIAGNOSIS — I4892 Unspecified atrial flutter: Secondary | ICD-10-CM

## 2017-01-13 DIAGNOSIS — Z5181 Encounter for therapeutic drug level monitoring: Secondary | ICD-10-CM | POA: Diagnosis not present

## 2017-01-13 LAB — POCT INR: INR: 1.9

## 2017-01-16 ENCOUNTER — Other Ambulatory Visit: Payer: Self-pay | Admitting: Cardiovascular Disease

## 2017-01-20 DIAGNOSIS — I10 Essential (primary) hypertension: Secondary | ICD-10-CM | POA: Diagnosis not present

## 2017-01-20 DIAGNOSIS — E7800 Pure hypercholesterolemia, unspecified: Secondary | ICD-10-CM | POA: Diagnosis not present

## 2017-01-20 DIAGNOSIS — I4891 Unspecified atrial fibrillation: Secondary | ICD-10-CM | POA: Diagnosis not present

## 2017-01-24 ENCOUNTER — Ambulatory Visit (INDEPENDENT_AMBULATORY_CARE_PROVIDER_SITE_OTHER): Payer: Medicare Other | Admitting: *Deleted

## 2017-01-24 DIAGNOSIS — I4892 Unspecified atrial flutter: Secondary | ICD-10-CM

## 2017-01-24 NOTE — Progress Notes (Signed)
Remote pacemaker transmission.   

## 2017-01-25 ENCOUNTER — Other Ambulatory Visit (HOSPITAL_COMMUNITY)
Admission: RE | Admit: 2017-01-25 | Discharge: 2017-01-25 | Disposition: A | Discharge status: Home / Self Care | Payer: Medicare Other | Source: Ambulatory Visit | Attending: Cardiovascular Disease | Admitting: Cardiovascular Disease

## 2017-01-25 ENCOUNTER — Telehealth: Payer: Self-pay | Admitting: Cardiovascular Disease

## 2017-01-25 ENCOUNTER — Ambulatory Visit (HOSPITAL_COMMUNITY)
Admission: RE | Admit: 2017-01-25 | Discharge: 2017-01-25 | Disposition: A | Discharge status: Home / Self Care | Payer: Medicare Other | Source: Ambulatory Visit | Attending: Cardiovascular Disease | Admitting: Cardiovascular Disease

## 2017-01-25 DIAGNOSIS — R0602 Shortness of breath: Secondary | ICD-10-CM

## 2017-01-25 DIAGNOSIS — Z951 Presence of aortocoronary bypass graft: Secondary | ICD-10-CM | POA: Diagnosis not present

## 2017-01-25 DIAGNOSIS — I5031 Acute diastolic (congestive) heart failure: Secondary | ICD-10-CM

## 2017-01-25 DIAGNOSIS — I509 Heart failure, unspecified: Secondary | ICD-10-CM | POA: Diagnosis not present

## 2017-01-25 DIAGNOSIS — I878 Other specified disorders of veins: Secondary | ICD-10-CM | POA: Insufficient documentation

## 2017-01-25 DIAGNOSIS — I10 Essential (primary) hypertension: Secondary | ICD-10-CM

## 2017-01-25 LAB — BRAIN NATRIURETIC PEPTIDE: B NATRIURETIC PEPTIDE 5: 271 pg/mL — AB (ref 0.0–100.0)

## 2017-01-25 NOTE — Telephone Encounter (Addendum)
Patient notified.  He will go to [facility] today to do lab & x-ray.  He will stop by office to pick up orders to take with him on the way.

## 2017-01-25 NOTE — Telephone Encounter (Signed)
Mr. Prokop called requested to speak with Dr. Bronson Ing .Did not want to give a reason. Explained that our Triage nurse Would call him.  Please call [phone].

## 2017-01-25 NOTE — Telephone Encounter (Signed)
Patient calling with c/o feeling like he has fluid in his lungs.  Stated that he remembered that it felt this way before prior to having initial issues and his bypass 4 years ago.  Stated he used to take Furosemide 40mg  twice a day, but has been doing daily x last 2-3 months.  No weight gain, dizziness, or chest pain.  Does notice SOB with exertion though.  Seems to be progressively getting more noticeable.  He questions if he needs to be seen.  Stated that he did hear some funny noises coming from his lungs which made him more concerned last evening.

## 2017-01-25 NOTE — Telephone Encounter (Signed)
Let's get a chest xray and BNP. Have him weigh daily.

## 2017-01-26 ENCOUNTER — Encounter: Payer: Self-pay | Admitting: Cardiology

## 2017-01-26 NOTE — Telephone Encounter (Signed)
Patient called.

## 2017-01-26 NOTE — Addendum Note (Signed)
Addended by: [name] on: 01/26/2017 04:59 PM   Modules accepted: Orders

## 2017-01-26 NOTE — Telephone Encounter (Signed)
Notes recorded by [name], LPN on 0/26/3785 at 8:85 PM EDT Patient notified. Copy to pmd. Patient request to take Lasix 40mg  BID x 4 days. Stated he took BID yesterday & planning on today as well, but does not feel any different. Confirmed with Dr. Bronson Ing that this would be okay, but needs BMET early Friday morning. Order will be printed & put up front for patient pick up. States he will go to lab across from [facility].

## 2017-01-26 NOTE — Telephone Encounter (Signed)
CHEST X-RAY -  Notes recorded by Herminio Commons, MD on 01/26/2017 at 12:39 PM EDT Mild amount of fluid in lungs. Can take 40 mg BID of Lasix for 2 days.   BNP -  Notes recorded by Herminio Commons, MD on 01/26/2017 at 12:39 PM EDT Mildly elevated indicative of fluid buildup in lungs. Addressed in result note for chest xray.

## 2017-01-27 ENCOUNTER — Encounter: Payer: Self-pay | Admitting: Internal Medicine

## 2017-01-27 ENCOUNTER — Ambulatory Visit (INDEPENDENT_AMBULATORY_CARE_PROVIDER_SITE_OTHER): Payer: Medicare Other | Admitting: *Deleted

## 2017-01-27 ENCOUNTER — Ambulatory Visit (INDEPENDENT_AMBULATORY_CARE_PROVIDER_SITE_OTHER): Payer: Medicare Other | Admitting: Internal Medicine

## 2017-01-27 ENCOUNTER — Other Ambulatory Visit: Payer: Self-pay | Admitting: *Deleted

## 2017-01-27 VITALS — BP 126/68 | HR 90 | Ht 71.0 in | Wt 230.0 lb

## 2017-01-27 DIAGNOSIS — I4892 Unspecified atrial flutter: Secondary | ICD-10-CM

## 2017-01-27 DIAGNOSIS — Z5181 Encounter for therapeutic drug level monitoring: Secondary | ICD-10-CM | POA: Diagnosis not present

## 2017-01-27 DIAGNOSIS — I442 Atrioventricular block, complete: Secondary | ICD-10-CM | POA: Diagnosis not present

## 2017-01-27 DIAGNOSIS — Z951 Presence of aortocoronary bypass graft: Secondary | ICD-10-CM | POA: Diagnosis not present

## 2017-01-27 DIAGNOSIS — I10 Essential (primary) hypertension: Secondary | ICD-10-CM

## 2017-01-27 DIAGNOSIS — I25708 Atherosclerosis of coronary artery bypass graft(s), unspecified, with other forms of angina pectoris: Secondary | ICD-10-CM

## 2017-01-27 LAB — POCT INR: INR: 2.6

## 2017-01-27 NOTE — Progress Notes (Signed)
HPI [name] returns today for follow-up. He is a very pleasant 76 year old man with a history of complete heart block, status post mitral valve surgery, diastolic heart failure, and obesity. In the interim, he admits to sodium indiscretion, and increasing dyspnea with exertion. His wife who is with him today states that he has become very sedentary. No chest pain, no syncope. No Known Allergies   Current Outpatient Prescriptions  Medication Sig Dispense Refill  . amLODipine (NORVASC) 10 MG tablet Take 1 tablet (10 mg total) by mouth daily. 90 tablet 3  . aspirin EC 81 MG tablet Take 1 tablet (81 mg total) by mouth daily. 90 tablet 3  . ASTAXANTHIN PO Take 10 mg by mouth daily. Reported on 10/30/2015    . atorvastatin (LIPITOR) 40 MG tablet Take 1 tablet by mouth daily. Reported on 11/24/2015    . carvedilol (COREG) 6.25 MG tablet Take 2 tablets (12.5 mg total) by mouth 2 (two) times daily. 360 tablet 3  . co-enzyme Q-10 30 MG capsule Take 100 mg by mouth 3 (three) times daily.    . cyanocobalamin 1000 MCG tablet Take 1,000 mcg by mouth daily.    . furosemide (LASIX) 40 MG tablet Take 40 mg by mouth daily.     Marland Kitchen KLOR-CON M10 10 MEQ tablet TAKE ONE TABLET BY MOUTH ONCE DAILY 90 tablet 1  . lisinopril (PRINIVIL,ZESTRIL) 20 MG tablet Take 1 tablet (20 mg total) by mouth daily. 90 tablet 3  . metFORMIN (GLUCOPHAGE) 500 MG tablet Take 1,000 mg by mouth 2 (two) times daily with a meal. Reported on 10/30/2015  2  . warfarin (COUMADIN) 5 MG tablet Take 1 1/2 tablets daily 45 tablet 6   No current facility-administered medications for this visit.      Past Medical History:  Diagnosis Date  . 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 Aortic stenosis 10/14/2015  . CAD (coronary artery disease)    status post Taxus stent patency of RCA 2004, Cardiolite negative for ischemia in 2010.  . Diabetes mellitus ([place])   . Dyslipidemia   . History of kidney stones   . Hypertension   .  Mitral regurgitation 10/15/2015   Moderate-severe by intra-operative TEE  . Overweight   . Right bundle branch block (RBBB) with left anterior hemiblock   . 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 Snores   . Typical atrial flutter ([place])    s/p ablation 11-23-2012 by Dr Rayann Heman    ROS:   All systems reviewed and negative except as noted in the HPI.   Past Surgical History:  Procedure Laterality Date  . ABLATION  11-23-2012   s/p cavotricuspid isthmus ablation by Dr Rayann Heman  . ANKLE SURGERY Left    total ankle replacement  . ATRIAL FLUTTER ABLATION N/A 11/23/2012   Procedure: ATRIAL FLUTTER ABLATION;  Surgeon: Thompson Grayer, MD;  Location: [facility] CATH LAB;  Service: Cardiovascular;  Laterality: N/A;  . CARDIAC CATHETERIZATION N/A 10/13/2015   Procedure: Left Heart Cath and Coronary Angiography;  Surgeon: Peter M Martinique, MD;  Location: [place] CV LAB;  Service: Cardiovascular;  Laterality: N/A;  . CATARACT EXTRACTION Right   .  CATARACT EXTRACTION W/PHACO Left 09/13/2013   Procedure: CATARACT EXTRACTION PHACO AND INTRAOCULAR LENS PLACEMENT (IOC);  Surgeon: Tonny Branch, MD;  Location: [facility] ORS;  Service: Ophthalmology;  Laterality: Left;  CDE 9.38  . COLONOSCOPY N/A 10/10/2014   Procedure: COLONOSCOPY;  Surgeon: Rogene Houston, MD;  [facility];  Service: Endoscopy;  Laterality: N/A;  830  . CORONARY ANGIOPLASTY     3 stents  . CORONARY ARTERY BYPASS GRAFT N/A 10/15/2015   Procedure: CORONARY ARTERY BYPASS GRAFTING (CABG) X4 LIMA-LAD; SEQ SVG-PD-PL; SVG-OM1 ENDOSCOPIC GREATER SAPHENOUS VEIN HARVEST(EVH) BILAT LOWER EXTREM;  Surgeon: Rexene Alberts, MD;  Location: [place];   Service: Open Heart Surgery;  Laterality: N/A;  . EP IMPLANTABLE DEVICE N/A 10/20/2015   Procedure: Pacemaker Implant;  Surgeon: Evans Lance, MD;  Location: [place] CV LAB;  Service: Cardiovascular;  Laterality: N/A;  . MITRAL VALVE REPAIR N/A 10/15/2015   Procedure: MITRAL VALVE REPAIR (MVR), # 28 MEMO 3-D RING ANNULOPLASTY AND COMPLEX VALVE REPAIR;  Surgeon: Rexene Alberts, MD;  Location: [place];  Service: Open Heart Surgery;  Laterality: N/A;  . TEE WITHOUT CARDIOVERSION N/A 10/15/2015   Procedure: TRANSESOPHAGEAL ECHOCARDIOGRAM (TEE);  Surgeon: Rexene Alberts, MD;  Location: [place];  Service: Open Heart Surgery;  Laterality: N/A;     Family History  Problem Relation Age of Onset  . Other Father 23       Died from MI.  . Other Mother        alive & well  . Leukemia Brother 105       died     Social History   Social History  . Marital status: Married    Spouse name: N/A  . Number of children: N/A  . Years of education: N/A   Occupational History  . 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 Smoking status: Former Smoker    Packs/day: 1.00    Years: 28.00    Types: Cigarettes    Start date: 10/21/1956    Quit date: 05/10/1985  . Smokeless tobacco: Never Used  . Alcohol use 0.0 oz/week     Comment: occasional wine  . Drug use: No  . Sexual activity: Yes    Birth control/ protection: None   Other Topics Concern  . Not on file   Social History Narrative   Lives in [place] with spouse.   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 HEART:  Regular rate rhythm, no murmurs, no rubs, no clicks Abd:  soft, positive bowel sounds, no  organomegally, no rebound, no guarding Ext:  2 plus pulses, no edema, no cyanosis, no clubbing Skin:  No rashes no nodules Neuro:  CN II through XII intact, motor grossly intact  EKG - none today  DEVICE  Normal device function.  See PaceArt for details.   Assess/Plan: 1. Complete heart block - he is asymptomatic status post pacemaker insertion. He has no underlying escape rhythm. 2. Chronic diastolic heart failure - he will continue his diuretic therapy, and is encouraged to reduce his salt intake.  Also, I have asked the patient to increase his physical activity, and lose weight. 3. Permanent pacemaker - his [place] pacemaker has been interrogated today and found to be working normally. He is approximately 10 years of battery longevity. 4. Paroxysmal atrial fibrillation - he is maintaining sinus rhythm very nicely. He will continue chronic warfarin therapy.  [name], M.D.

## 2017-01-27 NOTE — Patient Instructions (Addendum)
Medication Instructions:  Your physician recommends that you continue on your current medications as directed. Please refer to the Current Medication list given to you today.  Labwork: None ordered.  Testing/Procedures: None ordered.  Follow-Up: Your physician wants you to follow-up in: one year with Dr. Lovena Le.   You will receive a reminder letter in the mail two months in advance. If you don't receive a letter, please call our office to schedule the follow-up appointment.  Remote monitoring is used to monitor your Pacemaker from home. This monitoring reduces the number of office visits required to check your device to one time per year. It allows [place] to keep an eye on the functioning of your device to ensure it is working properly. You are scheduled for a device check from home on 04/25/2017. You may send your transmission at any time that day. If you have a wireless device, the transmission will be sent automatically. After your physician reviews your transmission, you will receive a postcard with your next transmission date.  [facility] [phone]  Reduce your salt intake  Any Other Special Instructions Will Be Listed Below (If Applicable).     If you need a refill on your cardiac medications before your next appointment, please call your pharmacy.

## 2017-01-28 ENCOUNTER — Other Ambulatory Visit (HOSPITAL_COMMUNITY)
Admission: RE | Admit: 2017-01-28 | Discharge: 2017-01-28 | Disposition: A | Discharge status: Home / Self Care | Payer: Medicare Other | Source: Ambulatory Visit | Attending: Cardiovascular Disease | Admitting: Cardiovascular Disease

## 2017-01-28 ENCOUNTER — Telehealth: Payer: Self-pay | Admitting: *Deleted

## 2017-01-28 DIAGNOSIS — I10 Essential (primary) hypertension: Secondary | ICD-10-CM | POA: Diagnosis not present

## 2017-01-28 LAB — BASIC METABOLIC PANEL
Anion gap: 10 (ref 5–15)
BUN: 29 mg/dL — AB (ref 6–20)
CALCIUM: 10.1 mg/dL (ref 8.9–10.3)
CO2: 29 mmol/L (ref 22–32)
CREATININE: 0.98 mg/dL (ref 0.61–1.24)
Chloride: 103 mmol/L (ref 101–111)
GFR calc Af Amer: >60 mL/min (ref >60)
Glucose, Bld: 147 mg/dL — ABNORMAL HIGH (ref 65–99)
Potassium: 4.7 mmol/L (ref 3.5–5.1)
Sodium: 142 mmol/L (ref 135–145)

## 2017-01-28 NOTE — Telephone Encounter (Signed)
-----   Message from Herminio Commons, MD sent at 01/28/2017  9:45 AM EDT ----- Kidney function is normal.

## 2017-01-28 NOTE — Telephone Encounter (Signed)
Pt aware - says he has been taking lasix 40 mg bid instead of daily - does he continue bid or daily? Last phone note says to resume daily but pt says he was told was ok to take lasix bid

## 2017-01-31 NOTE — Telephone Encounter (Signed)
As per my office note 8/30: Continue Lasix 40 mg daily. Hehas been instructed to weigh himself daily. Should he develop leg swelling, shortness of breath, or weight gain of 3 pounds within 24 hours, he can take an extra 40 mg.

## 2017-01-31 NOTE — Telephone Encounter (Signed)
New message     Pt followed up through the answering service, he was returning call he would like to know what next course of action is

## 2017-01-31 NOTE — Telephone Encounter (Signed)
Pt voiced understanding - says he misunderstood how he was to take lasix at his last appt - appreciative of clarification

## 2017-02-05 LAB — CUP PACEART INCLINIC DEVICE CHECK
Brady Statistic RA Percent Paced: 1 % — CL
Brady Statistic RV Percent Paced: 98 %
Date Time Interrogation Session: 20180929115746
Implantable Lead Location: 753859
Implantable Lead Serial Number: 760165
Lead Channel Setting Pacing Amplitude: 1.2 V
Lead Channel Setting Pacing Amplitude: 3.5 V
Lead Channel Setting Pacing Pulse Width: 0.4 ms
Lead Channel Setting Sensing Sensitivity: 2.5 mV
MDC IDC LEAD IMPLANT DT: 20170612
MDC IDC LEAD IMPLANT DT: 20170612
MDC IDC LEAD LOCATION: 753860
MDC IDC LEAD SERIAL: 733074
MDC IDC MSMT LEADCHNL RA PACING THRESHOLD AMPLITUDE: 1 V
MDC IDC MSMT LEADCHNL RA PACING THRESHOLD PULSEWIDTH: 0.4 ms
MDC IDC MSMT LEADCHNL RA SENSING INTR AMPL: 4.1 mV
MDC IDC MSMT LEADCHNL RV PACING THRESHOLD AMPLITUDE: 0.9 V
MDC IDC MSMT LEADCHNL RV PACING THRESHOLD PULSEWIDTH: 0.4 ms
MDC IDC PG IMPLANT DT: 20170612
Pulse Gen Serial Number: 724121

## 2017-02-10 ENCOUNTER — Ambulatory Visit (INDEPENDENT_AMBULATORY_CARE_PROVIDER_SITE_OTHER): Payer: Medicare Other | Admitting: *Deleted

## 2017-02-10 DIAGNOSIS — I4892 Unspecified atrial flutter: Secondary | ICD-10-CM | POA: Diagnosis not present

## 2017-02-10 DIAGNOSIS — Z5181 Encounter for therapeutic drug level monitoring: Secondary | ICD-10-CM | POA: Diagnosis not present

## 2017-02-10 LAB — POCT INR: INR: 2.7

## 2017-02-12 DIAGNOSIS — Z23 Encounter for immunization: Secondary | ICD-10-CM | POA: Diagnosis not present

## 2017-02-16 DIAGNOSIS — I10 Essential (primary) hypertension: Secondary | ICD-10-CM | POA: Diagnosis not present

## 2017-02-16 DIAGNOSIS — I4891 Unspecified atrial fibrillation: Secondary | ICD-10-CM | POA: Diagnosis not present

## 2017-02-16 DIAGNOSIS — E7800 Pure hypercholesterolemia, unspecified: Secondary | ICD-10-CM | POA: Diagnosis not present

## 2017-02-18 DIAGNOSIS — E1151 Type 2 diabetes mellitus with diabetic peripheral angiopathy without gangrene: Secondary | ICD-10-CM | POA: Diagnosis not present

## 2017-02-18 DIAGNOSIS — E1165 Type 2 diabetes mellitus with hyperglycemia: Secondary | ICD-10-CM | POA: Diagnosis not present

## 2017-02-18 DIAGNOSIS — I509 Heart failure, unspecified: Secondary | ICD-10-CM | POA: Diagnosis not present

## 2017-02-18 DIAGNOSIS — Z299 Encounter for prophylactic measures, unspecified: Secondary | ICD-10-CM | POA: Diagnosis not present

## 2017-02-18 DIAGNOSIS — I471 Supraventricular tachycardia: Secondary | ICD-10-CM | POA: Diagnosis not present

## 2017-02-22 LAB — CUP PACEART REMOTE DEVICE CHECK
Battery Remaining Longevity: 120 mo
Implantable Lead Implant Date: 20170612
Implantable Lead Location: 753860
Implantable Lead Model: 7742
Implantable Pulse Generator Implant Date: 20170612
Lead Channel Setting Pacing Amplitude: 1.2 V
Lead Channel Setting Pacing Amplitude: 3.5 V
MDC IDC LEAD IMPLANT DT: 20170612
MDC IDC LEAD LOCATION: 753859
MDC IDC LEAD SERIAL: 733074
MDC IDC LEAD SERIAL: 760165
MDC IDC SESS DTM: 20181016090321
MDC IDC SET LEADCHNL RV PACING PULSEWIDTH: 0.4 ms
MDC IDC SET LEADCHNL RV SENSING SENSITIVITY: 2.5 mV
MDC IDC STAT BRADY RV PERCENT PACED: 99 %
Pulse Gen Serial Number: 724121

## 2017-02-27 ENCOUNTER — Other Ambulatory Visit: Payer: Self-pay | Admitting: Cardiovascular Disease

## 2017-03-03 ENCOUNTER — Ambulatory Visit (INDEPENDENT_AMBULATORY_CARE_PROVIDER_SITE_OTHER): Payer: Medicare Other | Admitting: *Deleted

## 2017-03-03 DIAGNOSIS — I4892 Unspecified atrial flutter: Secondary | ICD-10-CM | POA: Diagnosis not present

## 2017-03-03 DIAGNOSIS — Z5181 Encounter for therapeutic drug level monitoring: Secondary | ICD-10-CM | POA: Diagnosis not present

## 2017-03-03 LAB — POCT INR: INR: 3

## 2017-03-22 DIAGNOSIS — E7800 Pure hypercholesterolemia, unspecified: Secondary | ICD-10-CM | POA: Diagnosis not present

## 2017-03-22 DIAGNOSIS — I10 Essential (primary) hypertension: Secondary | ICD-10-CM | POA: Diagnosis not present

## 2017-03-22 DIAGNOSIS — I4891 Unspecified atrial fibrillation: Secondary | ICD-10-CM | POA: Diagnosis not present

## 2017-03-23 ENCOUNTER — Ambulatory Visit (INDEPENDENT_AMBULATORY_CARE_PROVIDER_SITE_OTHER): Payer: Medicare Other | Admitting: *Deleted

## 2017-03-23 DIAGNOSIS — I4892 Unspecified atrial flutter: Secondary | ICD-10-CM | POA: Diagnosis not present

## 2017-03-23 DIAGNOSIS — Z5181 Encounter for therapeutic drug level monitoring: Secondary | ICD-10-CM | POA: Diagnosis not present

## 2017-03-23 LAB — POCT INR: INR: 2.8

## 2017-03-25 DIAGNOSIS — I509 Heart failure, unspecified: Secondary | ICD-10-CM | POA: Diagnosis not present

## 2017-03-25 DIAGNOSIS — E1165 Type 2 diabetes mellitus with hyperglycemia: Secondary | ICD-10-CM | POA: Diagnosis not present

## 2017-03-25 DIAGNOSIS — Z6832 Body mass index (BMI) 32.0-32.9, adult: Secondary | ICD-10-CM | POA: Diagnosis not present

## 2017-03-25 DIAGNOSIS — I4892 Unspecified atrial flutter: Secondary | ICD-10-CM | POA: Diagnosis not present

## 2017-03-25 DIAGNOSIS — Z299 Encounter for prophylactic measures, unspecified: Secondary | ICD-10-CM | POA: Diagnosis not present

## 2017-03-25 DIAGNOSIS — E1151 Type 2 diabetes mellitus with diabetic peripheral angiopathy without gangrene: Secondary | ICD-10-CM | POA: Diagnosis not present

## 2017-04-01 DIAGNOSIS — I509 Heart failure, unspecified: Secondary | ICD-10-CM | POA: Diagnosis not present

## 2017-04-01 DIAGNOSIS — Z6832 Body mass index (BMI) 32.0-32.9, adult: Secondary | ICD-10-CM | POA: Diagnosis not present

## 2017-04-01 DIAGNOSIS — J90 Pleural effusion, not elsewhere classified: Secondary | ICD-10-CM | POA: Diagnosis not present

## 2017-04-01 DIAGNOSIS — Z299 Encounter for prophylactic measures, unspecified: Secondary | ICD-10-CM | POA: Diagnosis not present

## 2017-04-01 DIAGNOSIS — I10 Essential (primary) hypertension: Secondary | ICD-10-CM | POA: Diagnosis not present

## 2017-04-01 DIAGNOSIS — E1165 Type 2 diabetes mellitus with hyperglycemia: Secondary | ICD-10-CM | POA: Diagnosis not present

## 2017-04-01 DIAGNOSIS — R0602 Shortness of breath: Secondary | ICD-10-CM | POA: Diagnosis not present

## 2017-04-01 DIAGNOSIS — I4892 Unspecified atrial flutter: Secondary | ICD-10-CM | POA: Diagnosis not present

## 2017-04-04 DIAGNOSIS — M25551 Pain in right hip: Secondary | ICD-10-CM | POA: Diagnosis not present

## 2017-04-04 DIAGNOSIS — I10 Essential (primary) hypertension: Secondary | ICD-10-CM | POA: Diagnosis not present

## 2017-04-04 DIAGNOSIS — I509 Heart failure, unspecified: Secondary | ICD-10-CM | POA: Diagnosis not present

## 2017-04-04 DIAGNOSIS — Z299 Encounter for prophylactic measures, unspecified: Secondary | ICD-10-CM | POA: Diagnosis not present

## 2017-04-04 DIAGNOSIS — E1165 Type 2 diabetes mellitus with hyperglycemia: Secondary | ICD-10-CM | POA: Diagnosis not present

## 2017-04-04 DIAGNOSIS — M47816 Spondylosis without myelopathy or radiculopathy, lumbar region: Secondary | ICD-10-CM | POA: Diagnosis not present

## 2017-04-04 DIAGNOSIS — Z6832 Body mass index (BMI) 32.0-32.9, adult: Secondary | ICD-10-CM | POA: Diagnosis not present

## 2017-04-04 DIAGNOSIS — M160 Bilateral primary osteoarthritis of hip: Secondary | ICD-10-CM | POA: Diagnosis not present

## 2017-04-08 DIAGNOSIS — I10 Essential (primary) hypertension: Secondary | ICD-10-CM | POA: Diagnosis not present

## 2017-04-08 DIAGNOSIS — Z6832 Body mass index (BMI) 32.0-32.9, adult: Secondary | ICD-10-CM | POA: Diagnosis not present

## 2017-04-08 DIAGNOSIS — Z299 Encounter for prophylactic measures, unspecified: Secondary | ICD-10-CM | POA: Diagnosis not present

## 2017-04-08 DIAGNOSIS — M79606 Pain in leg, unspecified: Secondary | ICD-10-CM | POA: Diagnosis not present

## 2017-04-13 DIAGNOSIS — I509 Heart failure, unspecified: Secondary | ICD-10-CM | POA: Diagnosis not present

## 2017-04-13 DIAGNOSIS — M79606 Pain in leg, unspecified: Secondary | ICD-10-CM | POA: Diagnosis not present

## 2017-04-13 DIAGNOSIS — Z299 Encounter for prophylactic measures, unspecified: Secondary | ICD-10-CM | POA: Diagnosis not present

## 2017-04-13 DIAGNOSIS — Z6831 Body mass index (BMI) 31.0-31.9, adult: Secondary | ICD-10-CM | POA: Diagnosis not present

## 2017-04-13 DIAGNOSIS — E7800 Pure hypercholesterolemia, unspecified: Secondary | ICD-10-CM | POA: Diagnosis not present

## 2017-04-13 DIAGNOSIS — I10 Essential (primary) hypertension: Secondary | ICD-10-CM | POA: Diagnosis not present

## 2017-04-14 ENCOUNTER — Ambulatory Visit (INDEPENDENT_AMBULATORY_CARE_PROVIDER_SITE_OTHER): Payer: Medicare Other | Admitting: *Deleted

## 2017-04-14 DIAGNOSIS — I4892 Unspecified atrial flutter: Secondary | ICD-10-CM | POA: Diagnosis not present

## 2017-04-14 DIAGNOSIS — Z5181 Encounter for therapeutic drug level monitoring: Secondary | ICD-10-CM | POA: Diagnosis not present

## 2017-04-14 LAB — POCT INR: INR: 4

## 2017-04-19 ENCOUNTER — Other Ambulatory Visit: Payer: Self-pay | Admitting: Cardiovascular Disease

## 2017-04-25 ENCOUNTER — Ambulatory Visit (INDEPENDENT_AMBULATORY_CARE_PROVIDER_SITE_OTHER): Payer: Medicare Other | Admitting: *Deleted

## 2017-04-25 DIAGNOSIS — I442 Atrioventricular block, complete: Secondary | ICD-10-CM

## 2017-04-25 NOTE — Progress Notes (Signed)
Remote pacemaker transmission.   

## 2017-04-27 ENCOUNTER — Encounter: Payer: Self-pay | Admitting: Cardiology

## 2017-04-27 DIAGNOSIS — M79606 Pain in leg, unspecified: Secondary | ICD-10-CM | POA: Diagnosis not present

## 2017-04-27 DIAGNOSIS — Z6831 Body mass index (BMI) 31.0-31.9, adult: Secondary | ICD-10-CM | POA: Diagnosis not present

## 2017-04-27 DIAGNOSIS — E7800 Pure hypercholesterolemia, unspecified: Secondary | ICD-10-CM | POA: Diagnosis not present

## 2017-04-27 DIAGNOSIS — I509 Heart failure, unspecified: Secondary | ICD-10-CM | POA: Diagnosis not present

## 2017-04-27 DIAGNOSIS — Z299 Encounter for prophylactic measures, unspecified: Secondary | ICD-10-CM | POA: Diagnosis not present

## 2017-04-27 DIAGNOSIS — I10 Essential (primary) hypertension: Secondary | ICD-10-CM | POA: Diagnosis not present

## 2017-04-27 DIAGNOSIS — E1165 Type 2 diabetes mellitus with hyperglycemia: Secondary | ICD-10-CM | POA: Diagnosis not present

## 2017-04-27 NOTE — Progress Notes (Signed)
Letter  

## 2017-04-28 ENCOUNTER — Ambulatory Visit (INDEPENDENT_AMBULATORY_CARE_PROVIDER_SITE_OTHER): Payer: Medicare Other | Admitting: *Deleted

## 2017-04-28 DIAGNOSIS — Z5181 Encounter for therapeutic drug level monitoring: Secondary | ICD-10-CM | POA: Diagnosis not present

## 2017-04-28 DIAGNOSIS — I4892 Unspecified atrial flutter: Secondary | ICD-10-CM | POA: Diagnosis not present

## 2017-04-28 LAB — CUP PACEART REMOTE DEVICE CHECK
Date Time Interrogation Session: 20181220082752
Implantable Lead Implant Date: 20170612
Implantable Lead Location: 753859
Implantable Lead Model: 7741
Implantable Lead Model: 7742
Implantable Lead Serial Number: 733074
MDC IDC LEAD IMPLANT DT: 20170612
MDC IDC LEAD LOCATION: 753860
MDC IDC LEAD SERIAL: 760165
MDC IDC PG IMPLANT DT: 20170612
Pulse Gen Serial Number: 724121

## 2017-04-28 LAB — POCT INR: INR: 2.7

## 2017-05-06 DIAGNOSIS — Z1211 Encounter for screening for malignant neoplasm of colon: Secondary | ICD-10-CM | POA: Diagnosis not present

## 2017-05-06 DIAGNOSIS — R5383 Other fatigue: Secondary | ICD-10-CM | POA: Diagnosis not present

## 2017-05-06 DIAGNOSIS — Z1339 Encounter for screening examination for other mental health and behavioral disorders: Secondary | ICD-10-CM | POA: Diagnosis not present

## 2017-05-06 DIAGNOSIS — Z125 Encounter for screening for malignant neoplasm of prostate: Secondary | ICD-10-CM | POA: Diagnosis not present

## 2017-05-06 DIAGNOSIS — I509 Heart failure, unspecified: Secondary | ICD-10-CM | POA: Diagnosis not present

## 2017-05-06 DIAGNOSIS — Z1331 Encounter for screening for depression: Secondary | ICD-10-CM | POA: Diagnosis not present

## 2017-05-06 DIAGNOSIS — Z7189 Other specified counseling: Secondary | ICD-10-CM | POA: Diagnosis not present

## 2017-05-06 DIAGNOSIS — E7800 Pure hypercholesterolemia, unspecified: Secondary | ICD-10-CM | POA: Diagnosis not present

## 2017-05-06 DIAGNOSIS — Z299 Encounter for prophylactic measures, unspecified: Secondary | ICD-10-CM | POA: Diagnosis not present

## 2017-05-06 DIAGNOSIS — Z0000 Encounter for general adult medical examination without abnormal findings: Secondary | ICD-10-CM | POA: Diagnosis not present

## 2017-05-06 DIAGNOSIS — Z6830 Body mass index (BMI) 30.0-30.9, adult: Secondary | ICD-10-CM | POA: Diagnosis not present

## 2017-05-06 DIAGNOSIS — Z79899 Other long term (current) drug therapy: Secondary | ICD-10-CM | POA: Diagnosis not present

## 2017-05-09 ENCOUNTER — Other Ambulatory Visit: Payer: Self-pay | Admitting: *Deleted

## 2017-05-09 MED ORDER — WARFARIN SODIUM 5 MG PO TABS: ORAL_TABLET | ORAL | 6 refills | Status: DC

## 2017-05-12 ENCOUNTER — Ambulatory Visit (INDEPENDENT_AMBULATORY_CARE_PROVIDER_SITE_OTHER): Payer: Medicare Other | Admitting: *Deleted

## 2017-05-12 DIAGNOSIS — I4892 Unspecified atrial flutter: Secondary | ICD-10-CM

## 2017-05-12 DIAGNOSIS — Z5181 Encounter for therapeutic drug level monitoring: Secondary | ICD-10-CM

## 2017-05-12 LAB — POCT INR: INR: 1.9

## 2017-05-12 NOTE — Patient Instructions (Signed)
Take coumadin 2 tablets tonight and 1 1/2 tablets tomorrow night then resume 1 1/2 tablets daily except 1 tablet on Mondays, Wednesdays and Fridays Recheck in 2 weeks

## 2017-05-23 DIAGNOSIS — E1165 Type 2 diabetes mellitus with hyperglycemia: Secondary | ICD-10-CM | POA: Diagnosis not present

## 2017-05-23 DIAGNOSIS — Z79899 Other long term (current) drug therapy: Secondary | ICD-10-CM | POA: Diagnosis not present

## 2017-05-23 DIAGNOSIS — Z6830 Body mass index (BMI) 30.0-30.9, adult: Secondary | ICD-10-CM | POA: Diagnosis not present

## 2017-05-23 DIAGNOSIS — I509 Heart failure, unspecified: Secondary | ICD-10-CM | POA: Diagnosis not present

## 2017-05-23 DIAGNOSIS — Z299 Encounter for prophylactic measures, unspecified: Secondary | ICD-10-CM | POA: Diagnosis not present

## 2017-05-23 DIAGNOSIS — E1151 Type 2 diabetes mellitus with diabetic peripheral angiopathy without gangrene: Secondary | ICD-10-CM | POA: Diagnosis not present

## 2017-05-23 DIAGNOSIS — M79606 Pain in leg, unspecified: Secondary | ICD-10-CM | POA: Diagnosis not present

## 2017-05-23 DIAGNOSIS — Z87891 Personal history of nicotine dependence: Secondary | ICD-10-CM | POA: Diagnosis not present

## 2017-05-23 DIAGNOSIS — I10 Essential (primary) hypertension: Secondary | ICD-10-CM | POA: Diagnosis not present

## 2017-05-23 DIAGNOSIS — I4892 Unspecified atrial flutter: Secondary | ICD-10-CM | POA: Diagnosis not present

## 2017-05-25 ENCOUNTER — Ambulatory Visit (INDEPENDENT_AMBULATORY_CARE_PROVIDER_SITE_OTHER): Payer: Medicare Other | Admitting: *Deleted

## 2017-05-25 DIAGNOSIS — Z5181 Encounter for therapeutic drug level monitoring: Secondary | ICD-10-CM | POA: Diagnosis not present

## 2017-05-25 DIAGNOSIS — I4892 Unspecified atrial flutter: Secondary | ICD-10-CM

## 2017-05-25 LAB — POCT INR: INR: 1.8

## 2017-05-25 NOTE — Patient Instructions (Signed)
Take 1 extra coumadin today then increase dose to 1 1/2 tablets daily except 1 tablet on Mondays,  Recheck in 10 days

## 2017-06-02 ENCOUNTER — Ambulatory Visit (INDEPENDENT_AMBULATORY_CARE_PROVIDER_SITE_OTHER): Payer: Medicare Other | Admitting: *Deleted

## 2017-06-02 DIAGNOSIS — I4892 Unspecified atrial flutter: Secondary | ICD-10-CM

## 2017-06-02 DIAGNOSIS — Z5181 Encounter for therapeutic drug level monitoring: Secondary | ICD-10-CM | POA: Diagnosis not present

## 2017-06-02 LAB — POCT INR: INR: 2.5

## 2017-06-02 NOTE — Patient Instructions (Signed)
Continue coumadin 1 1/2 tablets daily except 1 tablet on Mondays,  Recheck in 2 weeks

## 2017-06-12 ENCOUNTER — Other Ambulatory Visit: Payer: Self-pay | Admitting: Cardiovascular Disease

## 2017-06-13 ENCOUNTER — Ambulatory Visit (INDEPENDENT_AMBULATORY_CARE_PROVIDER_SITE_OTHER): Payer: Medicare Other | Admitting: Orthopedic Surgery

## 2017-06-13 ENCOUNTER — Encounter: Payer: Self-pay | Admitting: Orthopedic Surgery

## 2017-06-13 ENCOUNTER — Ambulatory Visit (INDEPENDENT_AMBULATORY_CARE_PROVIDER_SITE_OTHER): Payer: Medicare Other

## 2017-06-13 VITALS — BP 125/72 | HR 95 | Ht 71.0 in | Wt 220.0 lb

## 2017-06-13 DIAGNOSIS — M5410 Radiculopathy, site unspecified: Secondary | ICD-10-CM

## 2017-06-13 DIAGNOSIS — M5136 Other intervertebral disc degeneration, lumbar region: Secondary | ICD-10-CM

## 2017-06-13 DIAGNOSIS — M7021 Olecranon bursitis, right elbow: Secondary | ICD-10-CM | POA: Diagnosis not present

## 2017-06-13 NOTE — Patient Instructions (Addendum)
Elbow Bursitis Elbow bursitis is inflammation of the fluid-filled sac (bursa) between the tip of your elbow bone (olecranon) and your skin. Elbow bursitis may also be called olecranon bursitis. Normally, the olecranon bursa has only a small amount of fluid in it to cushion and protect your elbow bone. Elbow bursitis causes fluid to build up inside the bursa. Over time, this swelling and inflammation can cause pain when you bend or lean on your elbow. What are the causes? Elbow bursitis may be caused by:  Elbow injury (acute trauma).  Leaning on hard surfaces for long periods of time.  Infection from an injury that breaks the skin near your elbow.  A bone growth (spur) that forms at the tip of your elbow.  A medical condition that causes inflammation in your body, such as gout or rheumatoid arthritis.  The cause may also be unknown. What are the signs or symptoms? The first sign of elbow bursitis is usually swelling over the tip of your elbow. This can grow to be the size of a golf ball. This may start suddenly or develop gradually. You may also have:  Pain when bending or leaning on your elbow.  Restricted movement of your elbow.  If your bursitis is caused by an infection, symptoms may also include:  Redness, warmth, and tenderness of the elbow.  Drainage of pus from the swollen area over your elbow, if the skin breaks open.  How is this diagnosed? Your health care provider may be able to diagnose elbow bursitis based on your signs and symptoms, especially if you have recently been injured. Your health care provider will also do a physical exam. This may include:  X-rays to look for a bone spur or a bone fracture.  Draining fluid from the bursa to test it for infection.  Blood tests to rule out gout or rheumatoid arthritis.  How is this treated? Treatment for elbow bursitis depends on the cause. Treatment may include:  Medicines. These may include: ? Over-the-counter  medicines to relieve pain and inflammation. ? Antibiotic medicines to fight infection. ? Injections of anti-inflammatory medicines (steroids).  Wrapping your elbow with a bandage.  Draining fluid from the bursa.  Wearing elbow pads.  If your bursitis does not get better with treatment, surgery may be needed to remove the bursa. Follow these instructions at home:  Take medicines only as directed by your health care provider.  If you were prescribed an antibiotic medicine, finish all of it even if you start to feel better.  If your bursitis is caused by an injury, rest your elbow and wear your bandage as directed by your health care provider. You may alsoapply ice to the injured area as directed by your health care provider: ? Put ice in a plastic bag. ? Place a towel between your skin and the bag. ? Leave the ice on for 20 minutes, 2-3 times per day.  Avoid any activities that cause elbow pain.  Use elbow pads or elbow wraps to cushion your elbow. Contact a health care provider if:  You have a fever.  Your symptoms do not get better with treatment.  Your pain or swelling gets worse.  Your elbow pain or swelling goes away and then returns.  You have drainage of pus from the swollen area over your elbow. This information is not intended to replace advice given to you by your health care provider. Make sure you discuss any questions you have with your health care provider. Document Released:   05/26/2006 Document Revised: 10/02/2015 Document Reviewed: 01/02/2014 Elsevier Interactive Patient Education  2018 [employer]. Degenerative Disk Disease Degenerative disk disease is a condition caused by the changes that occur in spinal disks as you grow older. Spinal disks are soft and compressible disks located between the bones of your spine (vertebrae). These disks act like shock absorbers. Degenerative disk disease can affect the whole spine. However, the neck and lower back are most  commonly affected. Many changes can occur in the spinal disks with aging, such as:  The spinal disks may dry and shrink.  Small tears may occur in the tough, outer covering of the disk (annulus).  The disk space may become smaller due to loss of water.  Abnormal growths in the bone (spurs) may occur. This can put pressure on the nerve roots exiting the spinal canal, causing pain.  The spinal canal may become narrowed.  What increases the risk?  Being overweight.  Having a family history of degenerative disk disease.  Smoking.  There is increased risk if you are doing heavy lifting or have a sudden injury. What are the signs or symptoms? Symptoms vary from person to person and may include:  Pain that varies in intensity. Some people have no pain, while others have severe pain. The location of the pain depends on the part of your backbone that is affected. ? You will have neck or arm pain if a disk in the neck area is affected. ? You will have pain in your back, buttocks, or legs if a disk in the lower back is affected.  Pain that becomes worse while bending, reaching up, or with twisting movements.  Pain that may start gradually and then get worse as time passes. It may also start after a major or minor injury.  Numbness or tingling in the arms or legs.  How is this diagnosed? Your health care provider will ask you about your symptoms and about activities or habits that may cause the pain. He or she may also ask about any injuries, diseases, or treatments you have had. Your health care provider will examine you to check for the range of movement that is possible in the affected area, to check for strength in your extremities, and to check for sensation in the areas of the arms and legs supplied by different nerve roots. You may also have:  An X-ray of the spine.  Other imaging tests, such as MRI.  How is this treated? Your health care provider will advise you on the best plan  for treatment. Treatment may include:  Medicines.  Rehabilitation exercises.  Follow these instructions at home:  Follow proper lifting and walking techniques as advised by your health care provider.  Maintain good posture.  Exercise regularly as advised by your health care provider.  Perform relaxation exercises.  Change your sitting, standing, and sleeping habits as advised by your health care provider.  Change positions frequently.  Lose weight or maintain a healthy weight as advised by your health care provider.  Do not use any tobacco products, including cigarettes, chewing tobacco, or electronic cigarettes. If you need help quitting, ask your health care provider.  Wear supportive footwear.  Take medicines only as directed by your health care provider. Contact a health care provider if:  Your pain does not go away within 1-4 weeks.  You have significant appetite or weight loss. Get help right away if:  Your pain is severe.  You notice weakness in your arms, hands, or  legs.  You begin to lose control of your bladder or bowel movements.  You have fevers or night sweats. This information is not intended to replace advice given to you by your health care provider. Make sure you discuss any questions you have with your health care provider. Document Released: 02/21/2007 Document Revised: 10/02/2015 Document Reviewed: 08/28/2013 Elsevier Interactive Patient Education  [employer].

## 2017-06-13 NOTE — Addendum Note (Signed)
Addended byCandice Camp on: 06/13/2017 10:15 AM   Modules accepted: Orders

## 2017-06-13 NOTE — Progress Notes (Signed)
NEW PATIENT:  Chief Complaint  Patient presents with  . Leg Pain    RIGHT leg pain down side of right hip into right calf      77 year old male comes in for evaluation of his right hip and right leg as well as a new onset swelling in  As far as the hip and leg go he complains of dull aching constant right hip pain several weeks ago which has subsequently resolved and now has given him the residual of not being able to stand for long times.  He did take some tramadol he cannot take NSAIDs he is on warfarin  His right elbow has a swollen area which is large nontender non-erythematous present with no history of trauma     Review of Systems  Constitutional: Negative for fever.  Respiratory: Negative for shortness of breath.   Cardiovascular: Negative for chest pain.  Skin: Negative.   Neurological: Negative for tingling and sensory change.     Past Medical History:  Diagnosis Date  . 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 Aortic stenosis 10/14/2015  . CAD (coronary artery disease)    status post Taxus stent patency of RCA 2004, Cardiolite negative for ischemia in 2010.  . Diabetes mellitus ([place])   . Dyslipidemia   . History of kidney stones   . Hypertension   . Mitral regurgitation 10/15/2015   Moderate-severe by intra-operative TEE  . Overweight   . Right bundle branch block (RBBB) with left anterior hemiblock   . 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 Snores   . Typical atrial flutter [facility])    s/p ablation 11-23-2012 by Dr Rayann Heman   Past Surgical History:  Procedure Laterality Date  . ABLATION  11-23-2012   s/p cavotricuspid isthmus ablation  by Dr Rayann Heman  . ANKLE SURGERY Left    total ankle replacement  . ATRIAL FLUTTER ABLATION N/A 11/23/2012   Procedure: ATRIAL FLUTTER ABLATION;  Surgeon: Thompson Grayer, MD;  Location: [facility] CATH LAB;  Service: Cardiovascular;  Laterality: N/A;  . CARDIAC CATHETERIZATION N/A 10/13/2015   Procedure: Left Heart Cath and Coronary Angiography;  Surgeon: Peter M Martinique, MD;  Location: [place] CV LAB;  Service: Cardiovascular;  Laterality: N/A;  . CATARACT EXTRACTION Right   . CATARACT EXTRACTION W/PHACO Left 09/13/2013   Procedure: CATARACT EXTRACTION PHACO AND INTRAOCULAR LENS PLACEMENT (IOC);  Surgeon: Tonny [place], MD;  Location: [facility] ORS;  Service: Ophthalmology;  Laterality: Left;  CDE 9.38  . COLONOSCOPY N/A 10/10/2014   Procedure: COLONOSCOPY;  Surgeon: Rogene [place], MD;  Location: [facility] ENDO SUITE;  Service: Endoscopy;  Laterality: N/A;  830  . CORONARY ANGIOPLASTY     3 stents  . CORONARY ARTERY BYPASS GRAFT N/A 10/15/2015   Procedure: CORONARY ARTERY BYPASS GRAFTING (CABG) X4 LIMA-LAD; SEQ SVG-PD-PL; SVG-OM1 ENDOSCOPIC GREATER SAPHENOUS VEIN HARVEST(EVH) BILAT LOWER EXTREM;  Surgeon: Rexene Alberts, MD;  Location: [place];  Service: Open Heart Surgery;  Laterality: N/A;  . EP IMPLANTABLE DEVICE N/A 10/20/2015   Procedure: Pacemaker Implant;  Surgeon: Evans Lance, MD;  Location: [place] CV LAB;  Service: Cardiovascular;  Laterality: N/A;  . MITRAL VALVE REPAIR N/A  10/15/2015   Procedure: MITRAL VALVE REPAIR (MVR), # 28 MEMO 3-D RING ANNULOPLASTY AND COMPLEX VALVE REPAIR;  Surgeon: Rexene Alberts, MD;  Location: [place];  Service: Open Heart Surgery;  Laterality: N/A;  . TEE WITHOUT CARDIOVERSION N/A 10/15/2015   Procedure: TRANSESOPHAGEAL ECHOCARDIOGRAM (TEE);  Surgeon: Rexene Alberts, MD;  Location: [place];  Service: Open Heart Surgery;  Laterality: N/A;   Family History  Problem Relation Age of Onset  . Other Father 62       Died from MI.  . Other Mother        alive & well  . Leukemia Brother 56        died    Social History   Tobacco Use  . Smoking status: Former Smoker    Packs/day: 1.00    Years: 28.00    Pack years: 28.00    Types: Cigarettes    Start date: 10/21/1956    Last attempt to quit: 05/10/1985    Years since quitting: 32.1  . Smokeless tobacco: Never Used  Substance Use Topics  . Alcohol use: Yes    Alcohol/week: 0.0 oz    Comment: occasional wine  . Drug use: No     BP 125/72   Pulse 95   Ht 5\' 11"  (1.803 m)   Wt 220 lb (99.8 kg)   BMI 30.68 kg/m   Physical Exam  Constitutional: He is oriented to person, place, and time. He appears well-developed and well-nourished.  Vital signs have been reviewed and are stable. Gen. appearance the patient is well-developed and well-nourished with normal grooming and hygiene.   Musculoskeletal:  GAIT IS normal nothing specific  Neurological: He is alert and oriented to person, place, and time.  Skin: Skin is warm and dry. No erythema.  Psychiatric: He has a normal mood and affect.  Vitals reviewed.  Orthopedic exam  Right elbow large swollen bursa nontender full range of motion elbow normal strength in the right elbow .  Inspection palpation of the lumbar spine shows tenderness on the right side he does have decreased flexion extension some pain with extension  The right and left lower extremity inspection palpation were normal full range of motion in both hips both hips are stable strength in both legs was normal skin in both legs normal pulses and temperature normal in both legs sensation normal in both legs reflexes normal straight leg raise is negative in both legs no pathologic reflexes Radiology report dictated by Dr. [facility]  Chief complaint lower back pain with right leg pain  Sagittal and coronal plane are abnormal.  He has multilevel degenerative disc changes with flattened lumbar curvature coronal plane malalignment apex at L3.  Severe changes at L4-5 but changes noted  from L3 through S1  Facet arthritis of L3 through S1 primary joint space narrowing L3-L4 calcification of vessels anterior to the lumbar spine.  Impression spondylosis lumbar spine severe with severe joint space narrowing and arthrosis of L3-4 disc space and L3 through S1 facet joints  Aspiration  right elbow  Bursitis right elbow  I aspirated the right elbow and was able to obtain 23cc amber fluid.  Verbal consent obtained, timeout completed  Compression wrap 7 days   Start PT for the back come back as needed

## 2017-06-15 DIAGNOSIS — R262 Difficulty in walking, not elsewhere classified: Secondary | ICD-10-CM | POA: Diagnosis not present

## 2017-06-15 DIAGNOSIS — M79604 Pain in right leg: Secondary | ICD-10-CM | POA: Diagnosis not present

## 2017-06-15 DIAGNOSIS — M6281 Muscle weakness (generalized): Secondary | ICD-10-CM | POA: Diagnosis not present

## 2017-06-15 DIAGNOSIS — M549 Dorsalgia, unspecified: Secondary | ICD-10-CM | POA: Diagnosis not present

## 2017-06-16 ENCOUNTER — Ambulatory Visit (INDEPENDENT_AMBULATORY_CARE_PROVIDER_SITE_OTHER): Payer: Medicare Other | Admitting: *Deleted

## 2017-06-16 DIAGNOSIS — Z5181 Encounter for therapeutic drug level monitoring: Secondary | ICD-10-CM | POA: Diagnosis not present

## 2017-06-16 DIAGNOSIS — M6281 Muscle weakness (generalized): Secondary | ICD-10-CM | POA: Diagnosis not present

## 2017-06-16 DIAGNOSIS — I4892 Unspecified atrial flutter: Secondary | ICD-10-CM

## 2017-06-16 DIAGNOSIS — R262 Difficulty in walking, not elsewhere classified: Secondary | ICD-10-CM | POA: Diagnosis not present

## 2017-06-16 DIAGNOSIS — M79604 Pain in right leg: Secondary | ICD-10-CM | POA: Diagnosis not present

## 2017-06-16 DIAGNOSIS — M549 Dorsalgia, unspecified: Secondary | ICD-10-CM | POA: Diagnosis not present

## 2017-06-16 LAB — POCT INR: INR: 3

## 2017-06-16 NOTE — Patient Instructions (Signed)
Continue coumadin 1 1/2 tablets daily except 1 tablet on Mondays,  Recheck in 3 weeks

## 2017-06-17 DIAGNOSIS — M549 Dorsalgia, unspecified: Secondary | ICD-10-CM | POA: Diagnosis not present

## 2017-06-17 DIAGNOSIS — M79604 Pain in right leg: Secondary | ICD-10-CM | POA: Diagnosis not present

## 2017-06-17 DIAGNOSIS — M6281 Muscle weakness (generalized): Secondary | ICD-10-CM | POA: Diagnosis not present

## 2017-06-17 DIAGNOSIS — R262 Difficulty in walking, not elsewhere classified: Secondary | ICD-10-CM | POA: Diagnosis not present

## 2017-06-21 DIAGNOSIS — R262 Difficulty in walking, not elsewhere classified: Secondary | ICD-10-CM | POA: Diagnosis not present

## 2017-06-21 DIAGNOSIS — M549 Dorsalgia, unspecified: Secondary | ICD-10-CM | POA: Diagnosis not present

## 2017-06-21 DIAGNOSIS — M6281 Muscle weakness (generalized): Secondary | ICD-10-CM | POA: Diagnosis not present

## 2017-06-21 DIAGNOSIS — M79604 Pain in right leg: Secondary | ICD-10-CM | POA: Diagnosis not present

## 2017-06-22 DIAGNOSIS — M79604 Pain in right leg: Secondary | ICD-10-CM | POA: Diagnosis not present

## 2017-06-22 DIAGNOSIS — M549 Dorsalgia, unspecified: Secondary | ICD-10-CM | POA: Diagnosis not present

## 2017-06-22 DIAGNOSIS — R262 Difficulty in walking, not elsewhere classified: Secondary | ICD-10-CM | POA: Diagnosis not present

## 2017-06-22 DIAGNOSIS — M6281 Muscle weakness (generalized): Secondary | ICD-10-CM | POA: Diagnosis not present

## 2017-06-24 DIAGNOSIS — R262 Difficulty in walking, not elsewhere classified: Secondary | ICD-10-CM | POA: Diagnosis not present

## 2017-06-24 DIAGNOSIS — M79604 Pain in right leg: Secondary | ICD-10-CM | POA: Diagnosis not present

## 2017-06-24 DIAGNOSIS — M6281 Muscle weakness (generalized): Secondary | ICD-10-CM | POA: Diagnosis not present

## 2017-06-24 DIAGNOSIS — M549 Dorsalgia, unspecified: Secondary | ICD-10-CM | POA: Diagnosis not present

## 2017-06-27 ENCOUNTER — Encounter: Payer: Self-pay | Admitting: Orthopedic Surgery

## 2017-06-27 ENCOUNTER — Ambulatory Visit (INDEPENDENT_AMBULATORY_CARE_PROVIDER_SITE_OTHER): Payer: Medicare Other | Admitting: Orthopedic Surgery

## 2017-06-27 VITALS — BP 139/78 | HR 91 | Ht 71.0 in | Wt 220.0 lb

## 2017-06-27 DIAGNOSIS — M7021 Olecranon bursitis, right elbow: Secondary | ICD-10-CM | POA: Diagnosis not present

## 2017-06-27 NOTE — Progress Notes (Signed)
Progress Note   Patient ID: Christian Rasmussen, male   DOB: 12/12/1940, 77 y.o.   MRN: [id number]  Chief Complaint  Patient presents with  . Elbow Problem    follow up bursitis s/p aspiration 06/13/17 , fluid has returned    77 year old male presents for reevaluation of swelling of his right elbow status post right elbow aspiration of olecranon bursa     Review of Systems  Constitutional: Negative for fever.   Current Meds  Medication Sig  . amLODipine (NORVASC) 10 MG tablet TAKE ONE TABLET BY MOUTH ONCE DAILY  . aspirin EC 81 MG tablet Take 1 tablet (81 mg total) by mouth daily.  . ASTAXANTHIN PO Take 10 mg by mouth daily. Reported on 10/30/2015  . atorvastatin (LIPITOR) 40 MG tablet Take 1 tablet by mouth daily. Reported on 11/24/2015  . carvedilol (COREG) 6.25 MG tablet TAKE 2 TABLETS TWICE DAILY  . co-enzyme Q-10 30 MG capsule Take 100 mg by mouth 3 (three) times daily.  . cyanocobalamin 1000 MCG tablet Take 1,000 mcg by mouth daily.  . furosemide (LASIX) 40 MG tablet Take 40 mg by mouth daily.   Marland Kitchen KLOR-CON M10 10 MEQ tablet TAKE 1 TABLET BY MOUTH ONCE DAILY  . lisinopril (PRINIVIL,ZESTRIL) 20 MG tablet Take 1 tablet (20 mg total) by mouth daily.  . metFORMIN (GLUCOPHAGE) 500 MG tablet Take 1,000 mg by mouth 2 (two) times daily with a meal. Reported on 10/30/2015  . warfarin (COUMADIN) 5 MG tablet Take 1 1/2 tablets daily except 1 tablet on Mondays, Wednesdays and Fridays    No Known Allergies   BP 139/78   Pulse 91   Ht 5\' 11"  (1.803 m)   Wt 220 lb (99.8 kg)   BMI 30.68 kg/m   Physical Exam  Constitutional: He is oriented to person, place, and time. He appears well-developed and well-nourished.  Vital signs have been reviewed and are stable. Gen. appearance the patient is well-developed and well-nourished with normal grooming and hygiene.   Musculoskeletal:  Large swelling over the olecranon bursa the skin is red there is no tenderness.  The elbow is stable.  Neurovascular exam  is intact.  Neurological: He is alert and oriented to person, place, and time.  Skin: Skin is warm and dry. No erythema.  Psychiatric: He has a normal mood and affect.  Vitals reviewed.    Medical decision-making Encounter Diagnosis  Name Primary?  Marland Kitchen Olecranon bursitis of right elbow Yes      Aspiration right elbow  Verbal consent was given to aspirate the right elbow.  The elbow was cleaned and anesthetized and elbow was aspirated approximately 8 cc of red fluid  The patient will come back in a week he will use an Ace bandage for the next 3 days he will avoid pressure on his elbow     Arther Abbott, MD 06/27/2017 10:46 AM

## 2017-06-27 NOTE — Patient Instructions (Addendum)
Ace wrap 72 hours return 1 week Elbow Bursitis A bursa is a fluid-filled sac that covers and protects a joint. Bursitis is when the fluid-filled sac gets puffy and sore (inflamed). Elbow bursitis, also called olecranon bursitis, happens over your elbow. This may be caused by:  Injury (acute trauma) to your elbow.  Leaning on hard surfaces for long periods of time.  Infection from an injury that breaks the skin near your elbow.  A bone growth (spur) that forms at the tip of your elbow.  A medical condition that causes inflammation in your body, such as: ? Gout. ? Rheumatoid arthritis.  Sometimes the cause is not known. Follow these instructions at home:  Take medicines only as told by your doctor.  If you were prescribed an antibiotic medicine, finish all of it even if you start to feel better.  If your bursitis is caused by an injury, rest your elbow and wear your bandage as told by your doctor. You may also apply ice to the injured area as told by your doctor: ? Put ice in a plastic bag. ? Place a towel between your skin and the bag. ? Leave the ice on for 20 minutes, 2-3 times per day.  Do not do any activities that cause pain to your elbow.  Use elbow pads or wraps to cushion your elbow. Contact a doctor if:  You have a fever.  Your symptoms do not get better with treatment.  Your pain or swelling gets worse.  Your pain or swelling goes away and then comes back.  You have drainage of pus from the swollen area over your elbow. This information is not intended to replace advice given to you by your health care provider. Make sure you discuss any questions you have with your health care provider. Document Released: 10/14/2009 Document Revised: 10/02/2015 Document Reviewed: 01/02/2014 Elsevier Interactive Patient Education  [employer].

## 2017-06-28 DIAGNOSIS — M549 Dorsalgia, unspecified: Secondary | ICD-10-CM | POA: Diagnosis not present

## 2017-06-28 DIAGNOSIS — M79604 Pain in right leg: Secondary | ICD-10-CM | POA: Diagnosis not present

## 2017-06-28 DIAGNOSIS — R262 Difficulty in walking, not elsewhere classified: Secondary | ICD-10-CM | POA: Diagnosis not present

## 2017-06-28 DIAGNOSIS — M6281 Muscle weakness (generalized): Secondary | ICD-10-CM | POA: Diagnosis not present

## 2017-06-29 DIAGNOSIS — R262 Difficulty in walking, not elsewhere classified: Secondary | ICD-10-CM | POA: Diagnosis not present

## 2017-06-29 DIAGNOSIS — M549 Dorsalgia, unspecified: Secondary | ICD-10-CM | POA: Diagnosis not present

## 2017-06-29 DIAGNOSIS — M79604 Pain in right leg: Secondary | ICD-10-CM | POA: Diagnosis not present

## 2017-06-29 DIAGNOSIS — M6281 Muscle weakness (generalized): Secondary | ICD-10-CM | POA: Diagnosis not present

## 2017-07-01 ENCOUNTER — Ambulatory Visit (INDEPENDENT_AMBULATORY_CARE_PROVIDER_SITE_OTHER): Payer: Medicare Other | Admitting: Cardiovascular Disease

## 2017-07-01 ENCOUNTER — Encounter: Payer: Self-pay | Admitting: Cardiovascular Disease

## 2017-07-01 ENCOUNTER — Encounter: Payer: Self-pay | Admitting: *Deleted

## 2017-07-01 VITALS — BP 118/62 | HR 94 | Ht 71.0 in | Wt 216.0 lb

## 2017-07-01 DIAGNOSIS — Z9889 Other specified postprocedural states: Secondary | ICD-10-CM | POA: Diagnosis not present

## 2017-07-01 DIAGNOSIS — M6281 Muscle weakness (generalized): Secondary | ICD-10-CM | POA: Diagnosis not present

## 2017-07-01 DIAGNOSIS — R0602 Shortness of breath: Secondary | ICD-10-CM | POA: Diagnosis not present

## 2017-07-01 DIAGNOSIS — I350 Nonrheumatic aortic (valve) stenosis: Secondary | ICD-10-CM

## 2017-07-01 DIAGNOSIS — I5042 Chronic combined systolic (congestive) and diastolic (congestive) heart failure: Secondary | ICD-10-CM | POA: Diagnosis not present

## 2017-07-01 DIAGNOSIS — Z951 Presence of aortocoronary bypass graft: Secondary | ICD-10-CM | POA: Diagnosis not present

## 2017-07-01 DIAGNOSIS — I4892 Unspecified atrial flutter: Secondary | ICD-10-CM

## 2017-07-01 DIAGNOSIS — R262 Difficulty in walking, not elsewhere classified: Secondary | ICD-10-CM | POA: Diagnosis not present

## 2017-07-01 DIAGNOSIS — Z950 Presence of cardiac pacemaker: Secondary | ICD-10-CM

## 2017-07-01 DIAGNOSIS — M549 Dorsalgia, unspecified: Secondary | ICD-10-CM | POA: Diagnosis not present

## 2017-07-01 DIAGNOSIS — R942 Abnormal results of pulmonary function studies: Secondary | ICD-10-CM

## 2017-07-01 DIAGNOSIS — I10 Essential (primary) hypertension: Secondary | ICD-10-CM

## 2017-07-01 DIAGNOSIS — M79604 Pain in right leg: Secondary | ICD-10-CM | POA: Diagnosis not present

## 2017-07-01 NOTE — Patient Instructions (Addendum)
Medication Instructions:  Continue all current medications.  Labwork: none  Testing/Procedures: none  Follow-Up: Your physician wants you to follow up in:  1 year.  You will receive a reminder letter in the mail one-two months in advance.  If you don't receive a letter, please call our office to schedule the follow up appointment   Any Other Special Instructions Will Be Listed Below (If Applicable). You have been referred to:  Dr. Lake Bells [facility] Pulmonology)  If you need a refill on your cardiac medications before your next appointment, please call your pharmacy.

## 2017-07-01 NOTE — Progress Notes (Signed)
SUBJECTIVE: Christian Rasmussen returns for follow up for CABG, chronic systolic and diastolic heart failure,and mitral valve repair. 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 Lovena Le.  Echocardiogram showed mildly reduced left ventricular systolic function, LVEF 16-10%. There was mild aortic stenosis and regurgitation. There was a moderate gradient of 6 mmHg across the mitral valve with annuloplasty ring present.  ECG performed today which I reviewed demonstrates a paced ventricular rhythm.  He continues to experience shortness of breath with exertion which has been unchanged since prior to undergoing bypass surgery.  These specific symptoms never improved.  He took Lasix 40 mg twice daily for the past 3 days and his weight is down 4 pounds in the past 2-1/2 weeks, but the symptoms of shortness of breath remain.  Pulmonary function testing on 10/14/15 did not demonstrate COPD but did allude to a possible interstitial process such as pulmonary fibrosis versus an inflammatory process.  He denies chest pain and palpitations.   Soc Hx: Their only daughter committed suicide on Oct 01, 2016. Her 52 year old son lives with the patient and his wife.   Review of Systems: As per "subjective", otherwise negative.  No Known Allergies  Current Outpatient Medications  Medication Sig Dispense Refill  . amLODipine (NORVASC) 10 MG tablet TAKE ONE TABLET BY MOUTH ONCE DAILY 90 tablet 3  . aspirin EC 81 MG tablet Take 1 tablet (81 mg total) by mouth daily. 90 tablet 3  . ASTAXANTHIN PO Take 10 mg by mouth daily. Reported on 10/30/2015    . atorvastatin (LIPITOR) 40 MG tablet Take 1 tablet by mouth daily. Reported on 11/24/2015    . carvedilol (COREG) 6.25 MG tablet TAKE 2 TABLETS TWICE DAILY 360 tablet 3  . co-enzyme Q-10 30 MG capsule Take 100 mg by mouth 3 (three) times daily.    . cyanocobalamin 1000 MCG tablet Take 1,000 mcg by mouth daily.    . furosemide (LASIX) 40 MG  tablet Take 40 mg by mouth daily.     Marland Kitchen KLOR-CON M10 10 MEQ tablet TAKE 1 TABLET BY MOUTH ONCE DAILY 90 tablet 3  . lisinopril (PRINIVIL,ZESTRIL) 20 MG tablet Take 1 tablet (20 mg total) by mouth daily. 90 tablet 3  . metFORMIN (GLUCOPHAGE) 500 MG tablet Take 1,000 mg by mouth 2 (two) times daily with a meal. Reported on 10/30/2015  2  . warfarin (COUMADIN) 5 MG tablet Take 1 1/2 tablets daily except 1 tablet on Mondays, Wednesdays and Fridays (Patient taking differently: TAKE AS DIRECTED BY COUMADIN NURSE) 45 tablet 6   No current facility-administered medications for this visit.     Past Medical History:  Diagnosis Date  . 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 Aortic stenosis 10/14/2015  . CAD (coronary artery disease)    status post Taxus stent patency of RCA 2004, Cardiolite negative for ischemia in 2010.  . Diabetes mellitus ([place])   . Dyslipidemia   . History of kidney stones   . Hypertension   . Mitral regurgitation 10/15/2015   Moderate-severe by intra-operative TEE  . Overweight   . Right bundle branch block (RBBB) with left anterior hemiblock   . 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 S/P mitral valve repair 10/15/2015   Complex valvuloplasty including artificial Gore-tex neochord placement x6, decalcification of posterior annulus, autologous pericardial patch augmentation of posterior  leaflet, and 28 mm Sorin Memo 3D ring annuloplasty  . Snores   . Typical atrial flutter [facility])    s/p ablation 11-23-2012 by Dr Rayann Heman    Past Surgical History:  Procedure Laterality Date  . ABLATION  11-23-2012   s/p cavotricuspid isthmus ablation by Dr Rayann Heman  . ANKLE SURGERY Left    total ankle replacement  . ATRIAL FLUTTER ABLATION N/A 11/23/2012   Procedure: ATRIAL FLUTTER ABLATION;  Surgeon: Thompson Grayer, MD;  Location: [facility] CATH LAB;  Service: Cardiovascular;  Laterality: N/A;  . CARDIAC CATHETERIZATION N/A  10/13/2015   Procedure: Left Heart Cath and Coronary Angiography;  Surgeon: Peter M Martinique, MD;  Location: [place] CV LAB;  Service: Cardiovascular;  Laterality: N/A;  . CATARACT EXTRACTION Right   . CATARACT EXTRACTION W/PHACO Left 09/13/2013   Procedure: CATARACT EXTRACTION PHACO AND INTRAOCULAR LENS PLACEMENT (IOC);  Surgeon: Tonny Branch, MD;  Location: [facility] ORS;  Service: Ophthalmology;  Laterality: Left;  CDE 9.38  . COLONOSCOPY N/A 10/10/2014   Procedure: COLONOSCOPY;  Surgeon: Rogene Houston, MD;  Location: [facility] ENDO SUITE;  Service: Endoscopy;  Laterality: N/A;  830  . CORONARY ANGIOPLASTY     3 stents  . CORONARY ARTERY BYPASS GRAFT N/A 10/15/2015   Procedure: CORONARY ARTERY BYPASS GRAFTING (CABG) X4 LIMA-LAD; SEQ SVG-PD-PL; SVG-OM1 ENDOSCOPIC GREATER SAPHENOUS VEIN HARVEST(EVH) BILAT LOWER EXTREM;  Surgeon: Rexene Alberts, MD;  Location: [facility];  Service: Open Heart Surgery;  Laterality: N/A;  . EP IMPLANTABLE DEVICE N/A 10/20/2015   Procedure: Pacemaker Implant;  Surgeon: Evans Lance, MD;  Location: [place] CV LAB;  Service: Cardiovascular;  Laterality: N/A;  . MITRAL VALVE REPAIR N/A 10/15/2015   Procedure: MITRAL VALVE REPAIR (MVR), # 28 MEMO 3-D RING ANNULOPLASTY AND COMPLEX VALVE REPAIR;  Surgeon: Rexene Alberts, MD;  Location: [place];  Service: Open Heart Surgery;  Laterality: N/A;  . TEE WITHOUT CARDIOVERSION N/A 10/15/2015   Procedure: TRANSESOPHAGEAL ECHOCARDIOGRAM (TEE);  Surgeon: Rexene Alberts, MD;  Location: [place];  Service: Open Heart Surgery;  Laterality: N/A;    Social History   Socioeconomic History  . Marital status: Married    Spouse name: Not on file  . Number of children: Not on file  . Years of education: Not on file  . Highest education level: Not on file  Social Needs  . Financial resource strain: Not on file  . Food insecurity - worry: Not on file  . Food insecurity - inability: Not on file  . Transportation needs - medical: Not on file  . Transportation  needs - non-medical: Not on file  Occupational History  . 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 Smoking status: Former Smoker    Packs/day: 1.00    Years: 28.00    Pack years: 28.00    Types: Cigarettes    Start date: 10/21/1956    Last attempt to quit: 05/10/1985    Years since quitting: 32.1  . Smokeless tobacco: Never Used  Substance and Sexual Activity  . Alcohol use: Yes    Alcohol/week: 0.0 oz    Comment: occasional wine  . Drug use: No  . Sexual activity: Yes    Birth control/protection: None  Other Topics Concern  . Not on file  Social History Narrative   Lives in [place] with spouse.   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 Neck: No JVD, no thyromegaly. Lungs: Clear to auscultation bilaterally with normal respiratory effort. CV: Regular rate and rhythm with premature contractions appreciated, normal S1/S2, no S3/S4, no murmur. I/VI early systolic murmur over RUSB. Trace right perinankleedema.  Abdomen: Soft, nontender, no distention.  Neurologic: Alert and oriented.  Psych: Normal affect. Skin: Normal. Musculoskeletal: No gross deformities.    ECG: Most recent ECG reviewed.   Labs: Lab Results  Component Value Date/Time   K 4.7 01/28/2017 07:57 AM   BUN 29 (H) 01/28/2017 07:57 AM   CREATININE 0.98 01/28/2017 07:57 AM   ALT 17 04/28/2016 09:58 PM   TSH 4.924 (H) 03/18/2016 09:18 PM   HGB 11.8 (L) 07/08/2016 10:41 AM     Lipids: Lab Results  Component Value Date/Time   LDLCALC 43 10/14/2015 04:31 AM   CHOL 108 10/14/2015 04:31 AM   TRIG 157 (H) 10/14/2015 04:31 AM   HDL 34 (L) 10/14/2015 04:31 AM       ASSESSMENT AND PLAN: 1.  CAD s/p 4-v CABG: Symptomatically stable.  Continue aspirin, Lipitor, Coreg, and lisinopril.   2. S/p mitral valve repair: Moderate gradient of 6 mmHg. Will monitor.  3. S/p PPM for CHB: Stable.  Device is functioning normally.  F/u with Dr. Lovena Le.  4. Essential HTN: Controlled. No changes.  5. Hyperlipidemia: continue Lipitor.  6. Chronic systolic and diastolic heart failure, EF 45-50%: Euvolemic. Continue Lasix 40 mg daily. Hehas been instructed to weigh himself daily. Should he develop leg swelling, shortness of breath, or weight gain of 3 pounds within 24 hours, he can take an extra 40 mg.  7. Atrial flutter: Anticoagulated withwarfarin.  8. Aortic stenosis and regurgitation: Mild. Will monitor.  9.  Shortness of breath:He continues to experience shortness of breath with exertion which has been unchanged since prior to undergoing bypass surgery.  These specific symptoms never improved.  He took Lasix 40 mg twice daily for the past 3 days and his weight is down 4 pounds in the past 2-1/2 weeks, but the symptoms of shortness of breath remain. Pulmonary function testing on 10/14/15 did not demonstrate COPD but did allude to a possible interstitial process such as pulmonary fibrosis versus an inflammatory process. I will make a referral to pulmonary (Dr. Lake Bells).   Disposition: Follow up with Dr. Lovena Le in 01/2018. Follow up with me in one year.   Kate Sable, M.D., F.A.C.C.

## 2017-07-04 ENCOUNTER — Ambulatory Visit (INDEPENDENT_AMBULATORY_CARE_PROVIDER_SITE_OTHER): Payer: Medicare Other | Admitting: Orthopedic Surgery

## 2017-07-04 VITALS — BP 103/60 | HR 82 | Ht 71.0 in | Wt 216.0 lb

## 2017-07-04 DIAGNOSIS — M549 Dorsalgia, unspecified: Secondary | ICD-10-CM | POA: Diagnosis not present

## 2017-07-04 DIAGNOSIS — M7021 Olecranon bursitis, right elbow: Secondary | ICD-10-CM

## 2017-07-04 DIAGNOSIS — R262 Difficulty in walking, not elsewhere classified: Secondary | ICD-10-CM | POA: Diagnosis not present

## 2017-07-04 DIAGNOSIS — M6281 Muscle weakness (generalized): Secondary | ICD-10-CM | POA: Diagnosis not present

## 2017-07-04 DIAGNOSIS — M79604 Pain in right leg: Secondary | ICD-10-CM | POA: Diagnosis not present

## 2017-07-04 NOTE — Progress Notes (Signed)
Chief Complaint  Patient presents with  . Follow-up    Recheck on right elbow.    Follow-up after 2 aspirations right elbow for olecranon bursitis  Patient's swelling is much less.  I did aspirate 7 cc of red fluid  He will wear an Ace bandage for week and call us if the fluid comes back  Verbal consent.  Timeout taken.  Right elbow aspiration ethyl chloride alcohol used to skin prep 18-gauge needle 10 cc syringe 7 cc of bloody fluid obtained no complications

## 2017-07-05 ENCOUNTER — Telehealth: Payer: Self-pay | Admitting: Orthopedic Surgery

## 2017-07-05 NOTE — Telephone Encounter (Signed)
Patient has questions about Stem Cells Therapy. Do you think he would benefit from it.    Please advise.  He would like for you to call him about this @ [phone].

## 2017-07-06 DIAGNOSIS — M549 Dorsalgia, unspecified: Secondary | ICD-10-CM | POA: Diagnosis not present

## 2017-07-06 DIAGNOSIS — M79604 Pain in right leg: Secondary | ICD-10-CM | POA: Diagnosis not present

## 2017-07-06 DIAGNOSIS — R262 Difficulty in walking, not elsewhere classified: Secondary | ICD-10-CM | POA: Diagnosis not present

## 2017-07-06 DIAGNOSIS — M6281 Muscle weakness (generalized): Secondary | ICD-10-CM | POA: Diagnosis not present

## 2017-07-07 ENCOUNTER — Ambulatory Visit (INDEPENDENT_AMBULATORY_CARE_PROVIDER_SITE_OTHER): Payer: Medicare Other | Admitting: *Deleted

## 2017-07-07 DIAGNOSIS — I4892 Unspecified atrial flutter: Secondary | ICD-10-CM | POA: Diagnosis not present

## 2017-07-07 DIAGNOSIS — Z5181 Encounter for therapeutic drug level monitoring: Secondary | ICD-10-CM | POA: Diagnosis not present

## 2017-07-07 LAB — POCT INR: INR: 2.8

## 2017-07-07 NOTE — Patient Instructions (Signed)
Continue coumadin 1 1/2 tablets daily except 1 tablet on Mondays,  Recheck in 3 weeks

## 2017-07-11 DIAGNOSIS — M79604 Pain in right leg: Secondary | ICD-10-CM | POA: Diagnosis not present

## 2017-07-11 DIAGNOSIS — M6281 Muscle weakness (generalized): Secondary | ICD-10-CM | POA: Diagnosis not present

## 2017-07-11 DIAGNOSIS — M549 Dorsalgia, unspecified: Secondary | ICD-10-CM | POA: Diagnosis not present

## 2017-07-11 DIAGNOSIS — R262 Difficulty in walking, not elsewhere classified: Secondary | ICD-10-CM | POA: Diagnosis not present

## 2017-07-25 ENCOUNTER — Ambulatory Visit (INDEPENDENT_AMBULATORY_CARE_PROVIDER_SITE_OTHER): Payer: Medicare Other | Admitting: *Deleted

## 2017-07-25 DIAGNOSIS — I442 Atrioventricular block, complete: Secondary | ICD-10-CM

## 2017-07-25 DIAGNOSIS — M79605 Pain in left leg: Secondary | ICD-10-CM | POA: Diagnosis not present

## 2017-07-25 DIAGNOSIS — M79604 Pain in right leg: Secondary | ICD-10-CM | POA: Diagnosis not present

## 2017-07-25 NOTE — Progress Notes (Signed)
Remote pacemaker transmission.   

## 2017-07-26 LAB — CUP PACEART REMOTE DEVICE CHECK
Implantable Lead Implant Date: 20170612
Implantable Lead Location: 753859
Implantable Lead Location: 753860
Implantable Lead Model: 7741
Implantable Lead Model: 7742
Lead Channel Setting Pacing Amplitude: 3.5 V
Lead Channel Setting Pacing Pulse Width: 0.4 ms
MDC IDC LEAD IMPLANT DT: 20170612
MDC IDC LEAD SERIAL: 733074
MDC IDC LEAD SERIAL: 760165
MDC IDC PG IMPLANT DT: 20170612
MDC IDC PG SERIAL: 724121
MDC IDC SESS DTM: 20190319092911
MDC IDC SET LEADCHNL RV PACING AMPLITUDE: 1.2 V
MDC IDC SET LEADCHNL RV SENSING SENSITIVITY: 2.5 mV

## 2017-07-27 ENCOUNTER — Encounter: Payer: Self-pay | Admitting: Cardiology

## 2017-07-28 ENCOUNTER — Ambulatory Visit (INDEPENDENT_AMBULATORY_CARE_PROVIDER_SITE_OTHER): Payer: Medicare Other | Admitting: *Deleted

## 2017-07-28 DIAGNOSIS — H43813 Vitreous degeneration, bilateral: Secondary | ICD-10-CM | POA: Diagnosis not present

## 2017-07-28 DIAGNOSIS — Z5181 Encounter for therapeutic drug level monitoring: Secondary | ICD-10-CM | POA: Diagnosis not present

## 2017-07-28 DIAGNOSIS — I4892 Unspecified atrial flutter: Secondary | ICD-10-CM

## 2017-07-28 LAB — POCT INR: INR: 2.6

## 2017-07-28 NOTE — Patient Instructions (Signed)
Continue coumadin 1 1/2 tablets daily except 1 tablet on Mondays,  Recheck in 3 weeks

## 2017-08-04 ENCOUNTER — Other Ambulatory Visit (INDEPENDENT_AMBULATORY_CARE_PROVIDER_SITE_OTHER): Payer: Medicare Other

## 2017-08-04 ENCOUNTER — Encounter: Payer: Self-pay | Admitting: Pulmonary Disease

## 2017-08-04 ENCOUNTER — Telehealth: Payer: Self-pay | Admitting: Pulmonary Disease

## 2017-08-04 ENCOUNTER — Ambulatory Visit (INDEPENDENT_AMBULATORY_CARE_PROVIDER_SITE_OTHER): Payer: Medicare Other | Admitting: Pulmonary Disease

## 2017-08-04 VITALS — BP 124/70 | HR 91 | Ht 71.0 in | Wt 218.0 lb

## 2017-08-04 DIAGNOSIS — R0600 Dyspnea, unspecified: Secondary | ICD-10-CM

## 2017-08-04 LAB — CBC WITH DIFFERENTIAL/PLATELET
BASOS ABS: 0.1 10*3/uL (ref 0.0–0.1)
Basophils Relative: 0.9 % (ref 0.0–3.0)
Eosinophils Absolute: 0.1 10*3/uL (ref 0.0–0.7)
Eosinophils Relative: 1.2 % (ref 0.0–5.0)
HCT: 37.2 % — ABNORMAL LOW (ref 39.0–52.0)
Hemoglobin: 12.2 g/dL — ABNORMAL LOW (ref 13.0–17.0)
LYMPHS ABS: 1 10*3/uL (ref 0.7–4.0)
Lymphocytes Relative: 8.6 % — ABNORMAL LOW (ref 12.0–46.0)
MCHC: 32.8 g/dL (ref 30.0–36.0)
MCV: 87.7 fl (ref 78.0–100.0)
MONO ABS: 0.9 10*3/uL (ref 0.1–1.0)
Monocytes Relative: 8 % (ref 3.0–12.0)
NEUTROS ABS: 9.7 10*3/uL — AB (ref 1.4–7.7)
Neutrophils Relative %: 81.3 % — ABNORMAL HIGH (ref 43.0–77.0)
PLATELETS: 399 10*3/uL (ref 150.0–400.0)
RBC: 4.24 Mil/uL (ref 4.22–5.81)
RDW: 17.2 % — ABNORMAL HIGH (ref 11.5–15.5)
WBC: 11.9 10*3/uL — ABNORMAL HIGH (ref 4.0–10.5)

## 2017-08-04 NOTE — Progress Notes (Signed)
Subjective:   PATIENT ID: Christian Rasmussen GENDER: male DOB: [birth year], MRN: [id number]  Synopsis: Referred in 07/2017 for dyspnea, he has a past medical history significant for systolic heart failure, coronary artery disease and mitral valve repair in 2017 and pacer placed.  He also has atrial fibrillation.  HPI  Chief Complaint  Patient presents with  . Consult    Referred for abn pft by Dr. Bronson Ing.     [place] was sent to me for evaluation of dyspnea.   > started 2017 > hasn't improved much since 2017 > he first noticed it while walking up a boat ramp > they went to the hospital and the next day he had bypass surgery > he has a history of coronary disease in his family > he had dyspnea despite the surgery after just climbing his driveway > He has been trying to exercise but this has been hard > he has lost 20 pounds since surgery   He went to cardiac rehab in 2017 but had a hard time doing a treadmill due to dyspnea.  He was moved to the hand pedaling device and a recumbant.  He cannot walk further than about 10 minutes intermittently without stopping.  Sometimes walking 40-50 feet will make him short of breath.  He can carry in groceries.  He doesn't do yardwork.  He can slowly climb stairs.    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  He smoked as a teenager until age 67 1-2 ppd.    He doesn't get bronchitis or pneumonia.  He was never told he had any lung problems, no family history of lung problems but there is a rich history of cardiac disease.   Past Medical History:  Diagnosis Date  . 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 Aortic stenosis 10/14/2015  . CAD (coronary artery disease)    status post Taxus stent patency of RCA 2004, Cardiolite negative for ischemia in 2010.  . Diabetes mellitus ([place])   . Dyslipidemia   . History of kidney stones   . Hypertension   . Mitral regurgitation 10/15/2015   Moderate-severe by intra-operative TEE  .  Overweight   . Right bundle branch block (RBBB) with left anterior hemiblock   . 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 Snores   . Typical atrial flutter [facility])    s/p ablation 11-23-2012 by Dr Rayann Heman     Family History  Problem Relation Age of Onset  . Other Father 10       Died from MI.  . Other Mother        alive & well  . Leukemia Brother 43       died     Social History   Socioeconomic History  . Marital status: Married    Spouse name: Not on file  . Number of children: Not on file  . Years of education: Not on file  . Highest education level: Not on file  Occupational History  . 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 Financial resource strain: Not on file  . Food insecurity:  Worry: Not on file    Inability: Not on file  . Transportation needs:    Medical: Not on file    Non-medical: Not on file  Tobacco Use  . Smoking status: Former Smoker    Packs/day: 1.00    Years: 28.00    Pack years: 28.00    Types: Cigarettes    Start date: 10/21/1956    Last attempt to quit: 05/10/1985    Years since quitting: 32.2  . Smokeless tobacco: Never Used  Substance and Sexual Activity  . Alcohol use: Yes    Alcohol/week: 0.0 oz    Comment: occasional wine  . Drug use: No  . Sexual activity: Yes    Birth control/protection: None  Lifestyle  . Physical activity:    Days per week: Not on file    Minutes per session: Not on file  . Stress: Not on file  Relationships  . Social connections:    Talks on phone: Not on file    Gets together: Not on file    Attends religious service: Not on file    Active member  of club or organization: Not on file    Attends meetings of clubs or organizations: Not on file    Relationship status: Not on file  . Intimate partner violence:    Fear of current or ex partner: Not on file    Emotionally abused: Not on file    Physically abused: Not on file    Forced sexual activity: Not on file  Other Topics Concern  . Not on file  Social History Narrative   Lives in [place] with spouse.   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 amLODipine (NORVASC) 10 MG tablet TAKE ONE TABLET BY MOUTH ONCE DAILY 90 tablet 3  . aspirin EC 81 MG tablet Take 1 tablet (81 mg total) by mouth daily. 90 tablet 3  . ASTAXANTHIN PO Take 10 mg by mouth daily. Reported on 10/30/2015    . atorvastatin (LIPITOR) 40 MG tablet Take 1 tablet by mouth daily. Reported on 11/24/2015    . carvedilol (COREG) 6.25 MG tablet TAKE 2 TABLETS TWICE DAILY 360 tablet 3  . co-enzyme Q-10 30 MG capsule Take 100 mg by mouth 3 (three) times daily.    . cyanocobalamin 1000 MCG tablet Take 1,000 mcg by mouth daily.    . furosemide (LASIX) 40 MG tablet Take 40 mg by mouth daily.     Marland Kitchen KLOR-CON M10 10 MEQ tablet TAKE 1 TABLET BY MOUTH ONCE DAILY 90 tablet 3  . lisinopril (PRINIVIL,ZESTRIL) 20 MG tablet Take 1 tablet (20 mg total) by mouth daily. 90 tablet 3  . metFORMIN (GLUCOPHAGE) 500 MG tablet Take 1,000 mg by mouth 2 (two) times daily with a meal. Reported on 10/30/2015  2  . warfarin (COUMADIN) 5 MG tablet Take 1 1/2 tablets daily except 1 tablet on Mondays, Wednesdays and Fridays (Patient taking differently: TAKE AS DIRECTED BY COUMADIN NURSE) 45 tablet 6   No facility-administered medications prior to visit.     ROS    Objective:  Physical Exam   Vitals:   08/04/17 1004  BP: 124/70  Pulse: 91  SpO2: 94%  Weight: 218 lb (98.9 kg)  Height: 5\' 11"  (1.803 m)   RA  Gen: well appearing, no acute distress HENT: NCAT,  OP clear, neck supple without masses Eyes: PERRL,  EOMi Lymph: no cervical lymphadenopathy PULM: Crackles bases noted B, normal effort CV: RRR, no mgr, no JVD GI: BS+, soft, nontender, no hsm Derm: no rash or skin breakdown MSK: normal bulk and tone Neuro: A&Ox4, CN II-XII intact, strength 5/5 in all 4 extremities Psyche: normal mood and affect   CBC    Component Value Date/Time   WBC 9.8 07/08/2016 1041   WBC 18.6 (H) 04/28/2016 2158   RBC 4.07 (L) 07/08/2016 1041   RBC 4.54 04/28/2016 2158   HGB 11.8 (L) 07/08/2016 1041   HCT 35.2 (L) 07/08/2016 1041   PLT 306 07/08/2016 1041   MCV 87 07/08/2016 1041   MCH 29.0 07/08/2016 1041   MCH 29.5 04/28/2016 2158   MCHC 33.5 07/08/2016 1041   MCHC 32.9 04/28/2016 2158   RDW 15.7 (H) 07/08/2016 1041   LYMPHSABS 1.3 03/18/2016 2118   MONOABS 1.2 (H) 03/18/2016 2118   EOSABS 0.2 03/18/2016 2118   BASOSABS 0.1 03/18/2016 2118     Chest imaging: Feb 2019 chest imaging shows a pacemaker, cardiomegaly, chronic appearing left-sided pleural effusion  PFT: June 2017 PFT: Ratio 76%, FEV1 2.2 L 66% predicted, FVC 2.83 L 63% predicted, total lung capacity 5.4 L 74% predicted, DLCO 21.1 mL 62% predicted  Labs:  Path:  Echo:  Heart Catheterization:  Cardiology records from February 2019 reviewed where he was seen for coronary artery disease, chronic systolic heart failure, and a history of mitral valve repair.  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  Dr. Lovena Le placed a pacemaker.  He has atrial fibrillation.     Assessment & Plan:   No diagnosis found.  Discussion: [name] is here to see me today for evaluation of shortness of breath.  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  He is also overweight and likely out of shape.  So the causes of shortness of  breath here are many.  I do not see evidence of COPD based on the prior lung function test.  This all being said, when the heart is not feeling well and if he has congestive heart failure this can cause restrictive lung disease.  Now that his cardiac condition has been optimized I think it is best to repeat a lung function test and get a high-resolution CT scan of the chest to assess further for an underlying lung disease.  We also need to repeat a CBC to see if he is still anemic.  Ultimately, if we cannot find evidence of an underlying lung disease or anemia then we really need to focus on exercise and conditioning.    Plan: Shortness of breath: As described today this can be due to heart problems lung problems blood problems, neuromuscular problems and many other conditions Were going to check to see what is going on by checking a lung function test and a high-resolution CT scan of the chest.  These 2 tests will allow [place] to know if there is evidence of an underlying lung disease We will check your oxygen level while you are walking today. We will check a complete blood count to check your hemoglobin value to see if you are still anemic Ultimately we will likely need to get you in a controlled exercise regimen  Follow up in 2-4 weeks to go over these results   Current Outpatient  Medications:  .  amLODipine (NORVASC) 10 MG tablet, TAKE ONE TABLET BY MOUTH ONCE DAILY, Disp: 90 tablet, Rfl: 3 .  aspirin EC 81 MG tablet, Take 1 tablet (81 mg total) by mouth daily., Disp: 90 tablet, Rfl: 3 .  ASTAXANTHIN PO, Take 10 mg by mouth daily. Reported on 10/30/2015, Disp: , Rfl:  .  atorvastatin (LIPITOR) 40 MG tablet, Take 1 tablet by mouth daily. Reported on 11/24/2015, Disp: , Rfl:  .  carvedilol (COREG) 6.25 MG tablet, TAKE 2 TABLETS TWICE DAILY, Disp: 360 tablet, Rfl: 3 .  co-enzyme Q-10 30 MG capsule, Take 100 mg by mouth 3 (three) times daily., Disp: , Rfl:  .  cyanocobalamin 1000 MCG tablet, Take  1,000 mcg by mouth daily., Disp: , Rfl:  .  furosemide (LASIX) 40 MG tablet, Take 40 mg by mouth daily. , Disp: , Rfl:  .  KLOR-CON M10 10 MEQ tablet, TAKE 1 TABLET BY MOUTH ONCE DAILY, Disp: 90 tablet, Rfl: 3 .  lisinopril (PRINIVIL,ZESTRIL) 20 MG tablet, Take 1 tablet (20 mg total) by mouth daily., Disp: 90 tablet, Rfl: 3 .  metFORMIN (GLUCOPHAGE) 500 MG tablet, Take 1,000 mg by mouth 2 (two) times daily with a meal. Reported on 10/30/2015, Disp: , Rfl: 2 .  warfarin (COUMADIN) 5 MG tablet, Take 1 1/2 tablets daily except 1 tablet on Mondays, Wednesdays and Fridays (Patient taking differently: TAKE AS DIRECTED BY COUMADIN NURSE), Disp: 45 tablet, Rfl: 6

## 2017-08-04 NOTE — Telephone Encounter (Signed)
Called and spoke with patient, he is aware nothing further needed.  

## 2017-08-04 NOTE — Patient Instructions (Signed)
Shortness of breath: As described today this can be due to heart problems lung problems blood problems, neuromuscular problems and many other conditions Were going to check to see what is going on by checking a lung function test and a high-resolution CT scan of the chest.  These 2 tests will allow [place] to know if there is evidence of an underlying lung disease We will check your oxygen level while you are walking today. We will check a complete blood count to check your hemoglobin value to see if you are still anemic Ultimately we will likely need to get you in a controlled exercise regimen  Follow up in 2-4 weeks to go over these results

## 2017-08-18 ENCOUNTER — Ambulatory Visit (INDEPENDENT_AMBULATORY_CARE_PROVIDER_SITE_OTHER): Payer: Medicare Other | Admitting: *Deleted

## 2017-08-18 ENCOUNTER — Ambulatory Visit (HOSPITAL_COMMUNITY)
Admission: RE | Admit: 2017-08-18 | Discharge: 2017-08-18 | Disposition: A | Discharge status: Home / Self Care | Payer: Medicare Other | Source: Ambulatory Visit | Attending: Pulmonary Disease | Admitting: Pulmonary Disease

## 2017-08-18 DIAGNOSIS — I4892 Unspecified atrial flutter: Secondary | ICD-10-CM | POA: Diagnosis not present

## 2017-08-18 DIAGNOSIS — Z5181 Encounter for therapeutic drug level monitoring: Secondary | ICD-10-CM

## 2017-08-18 DIAGNOSIS — R918 Other nonspecific abnormal finding of lung field: Secondary | ICD-10-CM | POA: Diagnosis not present

## 2017-08-18 DIAGNOSIS — J90 Pleural effusion, not elsewhere classified: Secondary | ICD-10-CM | POA: Diagnosis not present

## 2017-08-18 DIAGNOSIS — I700 Atherosclerosis of aorta: Secondary | ICD-10-CM | POA: Insufficient documentation

## 2017-08-18 DIAGNOSIS — I281 Aneurysm of pulmonary artery: Secondary | ICD-10-CM | POA: Insufficient documentation

## 2017-08-18 DIAGNOSIS — R0600 Dyspnea, unspecified: Secondary | ICD-10-CM | POA: Insufficient documentation

## 2017-08-18 DIAGNOSIS — J439 Emphysema, unspecified: Secondary | ICD-10-CM | POA: Diagnosis not present

## 2017-08-18 LAB — POCT INR: INR: 3.3

## 2017-08-18 NOTE — Patient Instructions (Signed)
Took coumadin this morning.  Take coumadin 1/2 tablet tomorrow night then resume 1 1/2 tablets daily except 1 tablet on Mondays,  Recheck in 2 weeks

## 2017-08-23 ENCOUNTER — Ambulatory Visit (INDEPENDENT_AMBULATORY_CARE_PROVIDER_SITE_OTHER): Payer: Medicare Other | Admitting: Adult Health

## 2017-08-23 ENCOUNTER — Ambulatory Visit (INDEPENDENT_AMBULATORY_CARE_PROVIDER_SITE_OTHER): Payer: Medicare Other | Admitting: Pulmonary Disease

## 2017-08-23 ENCOUNTER — Encounter: Payer: Self-pay | Admitting: Adult Health

## 2017-08-23 DIAGNOSIS — R0600 Dyspnea, unspecified: Secondary | ICD-10-CM

## 2017-08-23 DIAGNOSIS — R0609 Other forms of dyspnea: Secondary | ICD-10-CM

## 2017-08-23 DIAGNOSIS — R918 Other nonspecific abnormal finding of lung field: Secondary | ICD-10-CM | POA: Diagnosis not present

## 2017-08-23 DIAGNOSIS — R911 Solitary pulmonary nodule: Secondary | ICD-10-CM

## 2017-08-23 LAB — PULMONARY FUNCTION TEST
DL/VA % pred: 86 %
DL/VA: 4.04 ml/min/mmHg/L
DLCO COR % PRED: 44 %
DLCO cor: 15.11 ml/min/mmHg
DLCO unc % pred: 41 %
DLCO unc: 13.97 ml/min/mmHg
FEF 25-75 POST: 1.35 L/s
FEF 25-75 Pre: 1.13 L/sec
FEF2575-%Change-Post: 19 %
FEF2575-%Pred-Post: 57 %
FEF2575-%Pred-Pre: 48 %
FEV1-%CHANGE-POST: 5 %
FEV1-%Pred-Post: 57 %
FEV1-%Pred-Pre: 54 %
FEV1-POST: 1.85 L
FEV1-Pre: 1.76 L
FEV1FVC-%Change-Post: 1 %
FEV1FVC-%PRED-PRE: 99 %
FEV6-%Change-Post: 3 %
FEV6-%PRED-PRE: 57 %
FEV6-%Pred-Post: 59 %
FEV6-POST: 2.47 L
FEV6-Pre: 2.4 L
FEV6FVC-%Change-Post: 0 %
FEV6FVC-%PRED-POST: 104 %
FEV6FVC-%Pred-Pre: 105 %
FVC-%CHANGE-POST: 4 %
FVC-%PRED-PRE: 54 %
FVC-%Pred-Post: 57 %
FVC-POST: 2.52 L
FVC-PRE: 2.42 L
POST FEV6/FVC RATIO: 98 %
Post FEV1/FVC ratio: 73 %
Pre FEV1/FVC ratio: 73 %
Pre FEV6/FVC Ratio: 99 %
RV % pred: 78 %
RV: 2.05 L
TLC % PRED: 60 %
TLC: 4.37 L

## 2017-08-23 NOTE — Progress Notes (Signed)
PFT done today. 

## 2017-08-23 NOTE — Assessment & Plan Note (Signed)
Scattered very small lung nodules on CT scan.  Largest measuring 5 mm.  We will follow-up CT chest in 1 year.

## 2017-08-23 NOTE — Patient Instructions (Addendum)
Advance activity as tolerated  I will be touch later today regarding pleural effusion .  CT chest in 1 year to follow lung nodules  Follow up with Dr. Lake Bells in 2-3 months and As needed     Late add : pt wants to hold on thoracentesis for now , will proceed with echo .

## 2017-08-23 NOTE — Assessment & Plan Note (Addendum)
Chronic dyspnea since 2017 seems to be progressive over the last year.  Suspect this is multifactorial with underlying systolic and diastolic heart failure, obesity, deconditioning, mild emphysema, mild anemia and chronic left pleural effusion. 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  There is no airflow obstruction.  CT chest shows mild anemia with no ILD.  Patient continues to have a left chronic pleural effusion that is been present since June 2017. Anemia is mild and stable. Patient appears euvolemic without evidence of fluid overload.  He is on Lasix 40 mg do not feel that elevated dose will be beneficial to help with left pleural effusion. Would get a 2D echo to make sure her EF has not decreased since 2017. Consider  left-sided thoracentesis- pt wants to think about this and get back to [place] with his decision .  Patient would benefit from pulmonary rehab.  Patient declines at this time but does state that he will try to do more activities at home.  Plan  Patient Instructions  Advance activity as tolerated  I will be touch later today regarding pleural effusion .  CT chest in 1 year to follow lung nodules  Follow up with Dr. Lake Bells in 2-3 months and As needed

## 2017-08-23 NOTE — Progress Notes (Signed)
Reviewed, agree 

## 2017-08-23 NOTE — Addendum Note (Signed)
Addended by: Parke Poisson E on: 08/23/2017 05:01 PM   Modules accepted: Orders

## 2017-08-23 NOTE — Progress Notes (Signed)
@Patient  ID: Christian Rasmussen, male    DOB: [birth year], 77 y.o.   MRN: [id number]  Chief Complaint  Patient presents with  . Follow-up    dyspnea     Referring provider: Monico Blitz, MD  HPI: 77 year old male former smoker seen for pulmonary consult August 04, 2017 for dyspnea. 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 Fib  TEST  June 2017 PFT: Ratio 76%, FEV1 2.2 L 66% predicted, FVC 2.83 L 63% predicted, total lung capacity 5.4 L 74% predicted, DLCO 21.1 mL 62% predicted  2D echo December 2017 showed EF 45-50%.   08/23/2017 Follow up: Dyspnea  Pt returns for a 2 week follow up . Seen last office visit for pulmonary consult for ongoing dyspnea.  Patient says in June 2017 he developed significant shortness of breath.  He underwent CABG x4 mitral valve repair and pacemaker placement.  Patient says that despite all of this he continued to have shortness of breath with activity.  This seems to have progressed over the last year.  He is somewhat sedentary and says this is mainly because he gets short of breath and wears out with minimum activity.  Patient was set up for a pulmonary workup with PFTs and a CT chest along with labs.  Results are as follows  PFTs done today August 23, 2017 showed moderate restriction FEV1 at 57%, ratio 73, FVC 57%, no significant bronchodilator response, DLCO 41%.  This has declined since June 2017.  CT chest April 2019 showed no ILD, small solid pulmonary nodules in the right lung largest 5 mm.  Mild emphysema , bilateral pleural effusions left greater than right . We discussed that he will need a CT chest in 1 year for follow-up to follow lung nodules Labs were unrevealing with stable mild anemia.  Eosinophils were normal  Patient denies any significant cough wheezing.  No chest pain.   Has chronic ankle edema that he says is stable on Lasix 40 mg  daily..  Chest x-ray and CT reviewed shows a chronic left pleural effusion since 10/2015 .  Last 2D echo was in December 2017 that showed an EF of 45-50%.  No Known Allergies  Immunization History  Administered Date(s) Administered  . Influenza,inj,quad, With Preservative 02/12/2017  . Pneumococcal Conjugate-13 05/10/2016  . Zoster Recombinat (Shingrix) 03/10/2017, 06/16/2017    Past Medical History:  Diagnosis Date  . 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 Aortic stenosis 10/14/2015  . CAD (coronary artery disease)    status post Taxus stent patency of RCA 2004, Cardiolite negative for ischemia in 2010.  . Diabetes mellitus ([place])   . Dyslipidemia   . History of kidney stones   . Hypertension   . Mitral regurgitation 10/15/2015   Moderate-severe by intra-operative TEE  . Overweight   . Right bundle branch block (RBBB) with left anterior hemiblock   . 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 Snores   . Typical atrial flutter [facility])    s/p ablation 11-23-2012 by Dr Rayann Heman    Tobacco History: Social History   Tobacco Use  Smoking Status Former Smoker  . Packs/day: 1.00  . Years: 28.00  . Pack years: 28.00  . Types: Cigarettes  . Start date: 10/21/1956  . Last attempt to quit: 05/10/1985  . Years since quitting: 32.3  Smokeless Tobacco Never Used   Counseling given: Not Answered   Outpatient Encounter Medications as of 08/23/2017  Medication Sig  . amLODipine (NORVASC) 10 MG tablet TAKE ONE TABLET BY MOUTH ONCE DAILY  . aspirin EC 81 MG tablet Take 1 tablet (81 mg total) by mouth daily.  . ASTAXANTHIN PO Take 10 mg by mouth daily. Reported on  10/30/2015  . atorvastatin (LIPITOR) 40 MG tablet Take 1 tablet by mouth daily. Reported on 11/24/2015  . carvedilol (COREG) 6.25 MG tablet TAKE 2 TABLETS TWICE DAILY  . co-enzyme Q-10 30 MG capsule Take 100 mg by mouth 3 (three) times daily.  . cyanocobalamin 1000 MCG tablet Take 1,000 mcg by mouth daily.  . furosemide (LASIX) 40 MG tablet Take 40 mg by mouth daily.   Marland Kitchen KLOR-CON M10 10 MEQ tablet TAKE 1 TABLET BY MOUTH ONCE DAILY  . lisinopril (PRINIVIL,ZESTRIL) 20 MG tablet Take 1 tablet (20 mg total) by mouth daily.  . metFORMIN (GLUCOPHAGE) 500 MG tablet Take 1,000 mg by mouth 2 (two) times daily with a meal. Reported on 10/30/2015  . warfarin (COUMADIN) 5 MG tablet Take 1 1/2 tablets daily except 1 tablet on Mondays, Wednesdays and Fridays (Patient taking differently: TAKE AS DIRECTED BY COUMADIN NURSE)   No facility-administered encounter medications on file as of 08/23/2017.      Review of Systems  Constitutional:   No  weight loss, night sweats,  Fevers, chills,  +fatigue, or  lassitude.  HEENT:   No headaches,  Difficulty swallowing,  Tooth/dental problems, or  Sore throat,                No sneezing, itching, ear ache, nasal congestion, post nasal drip,   CV:  No chest pain,  Orthopnea, PND, swelling in lower extremities, anasarca, dizziness, palpitations, syncope.   GI  No heartburn, indigestion, abdominal pain, nausea, vomiting, diarrhea, change in bowel habits, loss of appetite, bloody stools.   Resp:    No chest wall deformity  Skin: no rash or lesions.  GU: no dysuria, change in color of urine, no urgency or frequency.  No flank pain, no hematuria   MS:  No joint pain or swelling.  No decreased range of motion.  No back pain.    Physical Exam  BP 124/68 (BP Location: Left Arm, Cuff Size: Normal)   Pulse 88   Ht 5\' 11"  (1.803 m)   Wt 221 lb (100.2 kg)   SpO2 93%   BMI 30.82 kg/m   GEN: A/Ox3; pleasant , NAD, obese    HEENT:  [place]/AT,  EACs-clear, TMs-wnl,  NOSE-clear, THROAT-clear, no lesions, no postnasal drip or exudate noted.   NECK:  Supple w/ fair ROM; no JVD; normal carotid impulses w/o bruits; no thyromegaly or nodules palpated; no lymphadenopathy.    RESP decreased breath sounds in the bases ,  no accessory muscle use, no dullness to percussion  CARD:  RRR, no m/r/g,tr -1  peripheral edema, pulses intact, no cyanosis or clubbing.  GI:   Soft & nt; nml bowel sounds; no organomegaly or masses detected.   Musco: Warm bil, no deformities or joint swelling noted.   Neuro: alert, no focal deficits noted.    Skin: Warm, no lesions or rashes  Lab Results:  CBC  BMET   BNP  ProBNP No results found for: PROBNP  Imaging: Ct Chest High Resolution  Result Date: 08/19/2017 CLINICAL DATA:  Chronic dyspnea. History of CABG, aortic insufficiency and mitral regurgitation with mitral valve repair. EXAM: CT CHEST WITHOUT CONTRAST TECHNIQUE: Multidetector CT imaging of the chest was performed following the standard protocol without intravenous contrast. High resolution imaging of the lungs, as well as inspiratory and expiratory imaging, was performed. COMPARISON:  04/01/2017 chest radiograph. FINDINGS: Cardiovascular: Normal heart size. No significant pericardial fluid/thickening. Left main and 3 vessel coronary atherosclerosis status post CABG. 2 lead left subclavian pacemaker with lead tips terminating in the right atrium and right ventricular apex. Atherosclerotic nonaneurysmal thoracic aorta. Dilated main pulmonary artery (3.5 cm diameter). Mediastinum/Nodes: No discrete thyroid nodules. Unremarkable esophagus. No pathologically enlarged axillary, mediastinal or gross hilar lymph nodes, noting limited sensitivity for the detection of hilar adenopathy on this noncontrast study. Lungs/Pleura: No pneumothorax. Trace dependent right pleural effusion. Small dependent left pleural effusion. Mild centrilobular and paraseptal emphysema with diffuse  bronchial wall thickening. Mild dependent left lower lobe compressive atelectasis. A few scattered solid pulmonary nodules in the right lung, largest 5 mm in the right middle lobe (series 5/image 104). No acute consolidative airspace disease or lung masses. No significant lobular air trapping on the expiration sequence. Mild interlobular septal thickening in both lungs. No significant regions of subpleural reticulation, ground-glass attenuation, parenchymal banding, traction bronchiectasis, architectural distortion or frank honeycombing. Upper abdomen: Scattered tiny granulomatous calcifications in the liver are stable. Musculoskeletal: No aggressive appearing focal osseous lesions. Mild thoracic spondylosis. Intact sternotomy wires. IMPRESSION: 1. No evidence of interstitial lung disease. 2. Scattered small solid pulmonary nodules in the right lung, largest 5 mm. No follow-up needed if patient is low-risk (and has no known or suspected primary neoplasm). Non-contrast chest CT can be considered in 12 months if patient is high-risk. This recommendation follows the consensus statement: Guidelines for Management of Incidental Pulmonary Nodules Detected on CT Images:From the Fleischner Society 2017; published online before print (10.1148/radiol.[id number]). 3. Trace right and small left dependent pleural effusions. 4. Dilated main pulmonary artery, suggesting pulmonary arterial hypertension. 5. Mild interlobular septal thickening throughout the lungs, suggesting mild pulmonary edema. Aortic Atherosclerosis (ICD10-I70.0) and Emphysema (ICD10-J43.9). Electronically Signed   By: Ilona Sorrel M.D.   On: 08/19/2017 10:12     Assessment & Plan:   Dyspnea Chronic dyspnea since 2017 seems to be progressive over the last year.  Suspect this is multifactorial with underlying systolic and diastolic heart failure, obesity, deconditioning, mild emphysema, mild anemia and chronic left pleural effusion. PFT does show moderate  restrictive lung disease.  There is no airflow obstruction.  CT chest shows mild anemia with no ILD.  Patient continues to have a left chronic pleural effusion that is been present since June 2017. Anemia is mild and stable. Patient appears euvolemic without evidence of fluid overload.  He is on Lasix 40 mg do not feel that elevated dose will be beneficial to help with left pleural effusion. Would get a 2D echo to make sure her EF has not decreased since 2017. Consider  left-sided thoracentesis Patient would benefit from pulmonary rehab.  Patient declines at this time but does state that he will try to do more activities at home.  Plan  Patient Instructions  Advance activity as tolerated  I will be touch later today regarding pleural effusion .  CT chest in 1 year to follow lung nodules  Follow  up with Dr. Lake Bells in 2-3 months and As needed        Lung nodules Scattered very small lung nodules on CT scan.  Largest measuring 5 mm.  We will follow-up CT chest in 1 year.     Rexene Edison, NP 08/23/2017

## 2017-08-25 ENCOUNTER — Other Ambulatory Visit: Payer: Self-pay | Admitting: Cardiovascular Disease

## 2017-08-25 MED ORDER — AMLODIPINE BESYLATE 10 MG PO TABS: 10.0000 mg | ORAL_TABLET | Freq: Every day | ORAL | 3 refills | Status: DC

## 2017-08-25 NOTE — Telephone Encounter (Signed)
°*  STAT* If patient is at the pharmacy, call can be transferred to refill team.   1. Which medications need to be refilled?  amLODipine (NORVASC) 10 MG tablet    2. Which pharmacy/location (including street and city if local pharmacy) is medication to be sent to? [facility] [place]   3. Do they need a 30 day or 90 day supply? [place]

## 2017-08-25 NOTE — Telephone Encounter (Signed)
Patient informed - taken care of.

## 2017-08-26 ENCOUNTER — Other Ambulatory Visit (HOSPITAL_COMMUNITY): Payer: Medicare Other

## 2017-08-26 ENCOUNTER — Ambulatory Visit (HOSPITAL_COMMUNITY)
Admission: RE | Admit: 2017-08-26 | Discharge: 2017-08-26 | Disposition: A | Discharge status: Home / Self Care | Payer: Medicare Other | Source: Ambulatory Visit | Attending: Adult Health | Admitting: Adult Health

## 2017-08-26 DIAGNOSIS — Z951 Presence of aortocoronary bypass graft: Secondary | ICD-10-CM | POA: Insufficient documentation

## 2017-08-26 DIAGNOSIS — R0609 Other forms of dyspnea: Secondary | ICD-10-CM | POA: Insufficient documentation

## 2017-08-26 DIAGNOSIS — I350 Nonrheumatic aortic (valve) stenosis: Secondary | ICD-10-CM | POA: Diagnosis not present

## 2017-08-26 DIAGNOSIS — I4510 Unspecified right bundle-branch block: Secondary | ICD-10-CM | POA: Insufficient documentation

## 2017-08-26 DIAGNOSIS — I4892 Unspecified atrial flutter: Secondary | ICD-10-CM | POA: Insufficient documentation

## 2017-08-26 DIAGNOSIS — I252 Old myocardial infarction: Secondary | ICD-10-CM | POA: Diagnosis not present

## 2017-08-26 DIAGNOSIS — Z87891 Personal history of nicotine dependence: Secondary | ICD-10-CM | POA: Insufficient documentation

## 2017-08-26 DIAGNOSIS — Z01818 Encounter for other preprocedural examination: Secondary | ICD-10-CM | POA: Diagnosis not present

## 2017-08-26 DIAGNOSIS — I2510 Atherosclerotic heart disease of native coronary artery without angina pectoris: Secondary | ICD-10-CM | POA: Insufficient documentation

## 2017-08-26 MED ORDER — PERFLUTREN LIPID MICROSPHERE
1.0000 mL | INTRAVENOUS | Status: AC | PRN
  Administered 2017-08-26: 2 mL via INTRAVENOUS

## 2017-08-26 NOTE — Progress Notes (Signed)
*  PRELIMINARY RESULTS* Echocardiogram 2D Echocardiogram with definity has been performed.  Leavy Cella 08/26/2017, 1:26 PM

## 2017-08-29 ENCOUNTER — Telehealth: Payer: Self-pay | Admitting: Pulmonary Disease

## 2017-08-29 ENCOUNTER — Telehealth: Payer: Self-pay | Admitting: Cardiovascular Disease

## 2017-08-29 DIAGNOSIS — Z87891 Personal history of nicotine dependence: Secondary | ICD-10-CM | POA: Diagnosis not present

## 2017-08-29 DIAGNOSIS — J90 Pleural effusion, not elsewhere classified: Secondary | ICD-10-CM | POA: Diagnosis not present

## 2017-08-29 DIAGNOSIS — I2510 Atherosclerotic heart disease of native coronary artery without angina pectoris: Secondary | ICD-10-CM | POA: Diagnosis not present

## 2017-08-29 DIAGNOSIS — I509 Heart failure, unspecified: Secondary | ICD-10-CM | POA: Diagnosis not present

## 2017-08-29 DIAGNOSIS — Z6831 Body mass index (BMI) 31.0-31.9, adult: Secondary | ICD-10-CM | POA: Diagnosis not present

## 2017-08-29 DIAGNOSIS — I10 Essential (primary) hypertension: Secondary | ICD-10-CM | POA: Diagnosis not present

## 2017-08-29 DIAGNOSIS — I4892 Unspecified atrial flutter: Secondary | ICD-10-CM | POA: Diagnosis not present

## 2017-08-29 DIAGNOSIS — Z299 Encounter for prophylactic measures, unspecified: Secondary | ICD-10-CM | POA: Diagnosis not present

## 2017-08-29 DIAGNOSIS — E1165 Type 2 diabetes mellitus with hyperglycemia: Secondary | ICD-10-CM | POA: Diagnosis not present

## 2017-08-29 NOTE — Telephone Encounter (Signed)
Spoke with pt, he would like his results from his echocardiogram. Please advise. Marland Kitchen

## 2017-08-29 NOTE — Telephone Encounter (Signed)
Patient called asking results of the Echo he had done on  08/26/17

## 2017-08-29 NOTE — Telephone Encounter (Signed)
The echo was done on  Friday and has not been reviewed by the ordering provider. Left voicemail informing patient of this as well as that it was ordered by pulmonary and would need to be discussed with them.

## 2017-08-30 ENCOUNTER — Ambulatory Visit (INDEPENDENT_AMBULATORY_CARE_PROVIDER_SITE_OTHER): Payer: Medicare Other | Admitting: *Deleted

## 2017-08-30 DIAGNOSIS — Z5181 Encounter for therapeutic drug level monitoring: Secondary | ICD-10-CM | POA: Diagnosis not present

## 2017-08-30 DIAGNOSIS — I4892 Unspecified atrial flutter: Secondary | ICD-10-CM

## 2017-08-30 LAB — POCT INR: INR: 2.6

## 2017-08-30 NOTE — Telephone Encounter (Signed)
Notes recorded by Melvenia Needles, NP on 08/30/2017 at 12:34 PM EDT Echo shows improved EF at 50-55%, apex hypokinetic , AV mod calcified , MV mod to severe gradient across, PAP 76mmHg .  Would follow up with Cardiology - prev MV repair  Cont with ov recs Please contact office for sooner follow up if symptoms do not improve or worsen or seek emergency care    Tried to call x 2 , pt phone cut out both times  ------------------  atc pt, no answer and vm full.  Wcb.

## 2017-08-30 NOTE — Telephone Encounter (Signed)
Tried to call x 2  Please see results

## 2017-08-30 NOTE — Telephone Encounter (Signed)
Patient would like for Dr Bronson Ing to look over his test and contact him in reference to it.   Said Pulmonary has given him results but he was told he may want to discuss results with his cardiologist

## 2017-08-30 NOTE — Telephone Encounter (Signed)
Pt is returning call. Cb is [phone]

## 2017-08-30 NOTE — Telephone Encounter (Signed)
Pt called back, reviewed results.  Advised pt to follow up with cardiology, and pt expressed understanding. Pt has an additional question while on phone- states that he has not heard anything regarding the thoracentesis that was discussed- states he wants to know about the size of the needle, the expected pain from the procedure, what labs can be tested from the fluid drawn.  Discussed this verbally with TP after our phone conversation, states she will call the patient to discuss further.    Routing to TP for follow-up.

## 2017-08-30 NOTE — Patient Instructions (Signed)
Continue coumadin 1 1/2 tablets daily except 1 tablet on Mondays,  Recheck in 2 weeks

## 2017-08-30 NOTE — Telephone Encounter (Signed)
Pt doing better, dyspnea is some better.  Wants to wait for now on thoracentesis  Will discuss with Cards on echo  Appears stable to slightly improved EF . , PAP is no change ,  MV gradient is elevated ( prev MV repair , will discuss with cards )_  Please contact office for sooner follow up if symptoms do not improve or worsen or seek emergency care

## 2017-09-01 NOTE — Telephone Encounter (Signed)
Patient notified and verbalized understanding. 

## 2017-09-01 NOTE — Telephone Encounter (Signed)
Will send to Dr. Bronson Ing to have him review echo and see if patient needs appointment

## 2017-09-01 NOTE — Telephone Encounter (Signed)
Mr. Scull called today requesting to get results of a recent test.  Please call his cell #

## 2017-09-01 NOTE — Telephone Encounter (Signed)
Left ventricular systolic function has improved, the gradient across the mitral valve has increased which may be contributing to his symptoms.  Please make a referral to structural heart team.

## 2017-09-11 NOTE — H&P (View-Only) (Signed)
Valve Clinic Consult Note  Chief Complaint  Patient presents with  . 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 He underwent mitral valve repair and 4V CABG in June 2017. He has had dyspnea since his bypass with no improvement on Lasix. He has been seen in the pulmonary office by DR. McQuaid. PFTs April 2019 with moderate restrictive lung disease. CT chest April 2019 with no interstitial lung disease but there was mild emphysema with bilateral pleural effusions. Echo arranged by the pulmonary team. No plan to address pleural effusions. Echo 08/26/17 with moderate LVH, LVEF=50-55% with possible hypokinesis of the apex, moderate aortic stenosis with mean gradient of 11 mmHg, DVI 0.28. The mitral valve leaflets are thickened. Mitral valve ring in place. Mean gradient across the mitral valve is 8 mmHg with HR of 77 bpm. No MR. Dr. Bronson Ing was notified of the echo results and referred the patient to the valve clinic for further workup. He has a permanent pacemaker in place. He is on coumadin with history of atrial fib/flutter.   The patient tells me today he has daily dyspnea with minimal exertion. This is unchanged from his surgery in 2017. In fact, his dyspnea never improved after his CABG and mitral valve repair. He has no chest pain. He has no LE edema, dizziness, near syncope or syncope.   Primary Care Physician: Monico Blitz, MD Primary Cardiologist: Dr. Bronson Ing  Past Medical History:  Diagnosis Date  . 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 Aortic  stenosis 10/14/2015  . CAD (coronary artery disease)    status post Taxus stent patency of RCA 2004, Cardiolite negative for ischemia in 2010.  . Diabetes mellitus ([place])   . Dyslipidemia   . History of kidney stones   . Hypertension   . Mitral regurgitation 10/15/2015   Moderate-severe by intra-operative TEE  . Overweight   . Right bundle branch block (RBBB) with left anterior hemiblock   . 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 Snores   . Typical atrial flutter [facility])    s/p ablation 11-23-2012 by Dr Rayann Heman    Past Surgical History:  Procedure Laterality Date  . ABLATION  11-23-2012   s/p cavotricuspid isthmus ablation by Dr Rayann Heman  . ANKLE SURGERY Left    total ankle replacement  . ATRIAL FLUTTER ABLATION N/A 11/23/2012   Procedure: ATRIAL FLUTTER ABLATION;  Surgeon: Thompson Grayer, MD;  [facility] C[facility] LAB;  Service: Cardiovascular;  Laterality: N/A;  . CARDIAC CATHETERIZATION N/A 10/13/2015   Procedure: Left Heart Cath and Coronary Angiography;  Surgeon: Peter M Martinique, MD;  Location: [place] CV LAB;  Service: Cardiovascular;  Laterality: N/A;  . CATARACT EXTRACTION Right   . CATARACT EXTRACTION W/PHACO Left 09/13/2013  Procedure: CATARACT EXTRACTION PHACO AND INTRAOCULAR LENS PLACEMENT (IOC);  Surgeon: Tonny Branch, MD;  Location: [facility] ORS;  Service: Ophthalmology;  Laterality: Left;  CDE 9.38  . COLONOSCOPY N/A 10/10/2014   Procedure: COLONOSCOPY;  Surgeon: Rogene Houston, MD;  Location: [facility] ENDO SUITE;  Service: Endoscopy;  Laterality: N/A;  830  . CORONARY ANGIOPLASTY     3 stents  . CORONARY ARTERY BYPASS GRAFT N/A 10/15/2015   Procedure: CORONARY ARTERY BYPASS GRAFTING  (CABG) X4 LIMA-LAD; SEQ SVG-PD-PL; SVG-OM1 ENDOSCOPIC GREATER SAPHENOUS VEIN HARVEST(EVH) BILAT LOWER EXTREM;  Surgeon: Rexene Alberts, MD;  Location: [place];  Service: Open Heart Surgery;  Laterality: N/A;  . EP IMPLANTABLE DEVICE N/A 10/20/2015   Procedure: Pacemaker Implant;  Surgeon: Evans Lance, MD;  Location: [place] CV LAB;  Service: Cardiovascular;  Laterality: N/A;  . MITRAL VALVE REPAIR N/A 10/15/2015   Procedure: MITRAL VALVE REPAIR (MVR), # 28 MEMO 3-D RING ANNULOPLASTY AND COMPLEX VALVE REPAIR;  Surgeon: Rexene Alberts, MD;  Location: [place];  Service: Open Heart Surgery;  Laterality: N/A;  . TEE WITHOUT CARDIOVERSION N/A 10/15/2015   Procedure: TRANSESOPHAGEAL ECHOCARDIOGRAM (TEE);  Surgeon: Rexene Alberts, MD;  Location: [place];  Service: Open Heart Surgery;  Laterality: N/A;    Current Outpatient Medications  Medication Sig Dispense Refill  . amLODipine (NORVASC) 10 MG tablet Take 1 tablet (10 mg total) by mouth daily. 90 tablet 3  . aspirin EC 81 MG tablet Take 1 tablet (81 mg total) by mouth daily. 90 tablet 3  . ASTAXANTHIN PO Take 10 mg by mouth daily. Reported on 10/30/2015    . atorvastatin (LIPITOR) 40 MG tablet Take 1 tablet by mouth daily. Reported on 11/24/2015    . carvedilol (COREG) 6.25 MG tablet TAKE 2 TABLETS TWICE DAILY 360 tablet 3  . co-enzyme Q-10 30 MG capsule Take 100 mg by mouth 3 (three) times daily.    . furosemide (LASIX) 40 MG tablet Take 40 mg by mouth daily.     Marland Kitchen KLOR-CON M10 10 MEQ tablet TAKE 1 TABLET BY MOUTH ONCE DAILY 90 tablet 3  . lisinopril (PRINIVIL,ZESTRIL) 20 MG tablet Take 1 tablet (20 mg total) by mouth daily. 90 tablet 3  . metFORMIN (GLUCOPHAGE) 500 MG tablet Take 1,000 mg by mouth 2 (two) times daily with a meal. Reported on 10/30/2015  2  . warfarin (COUMADIN) 5 MG tablet Take 1 1/2 tablets daily except 1 tablet on Mondays, Wednesdays and Fridays (Patient taking differently: TAKE AS DIRECTED BY COUMADIN NURSE) 45 tablet 6   No  current facility-administered medications for this visit.     No Known Allergies  Social History   Socioeconomic History  . Marital status: Married    Spouse name: Not on file  . Number of children: 1  . Years of education: Not on file  . Highest education level: Not on file  Occupational History  . 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 Financial resource strain: Not on file  . Food insecurity:    Worry: Not on file    Inability: Not on file  . Transportation needs:    Medical: Not on file    Non-medical: Not on file  Tobacco Use  . Smoking status: Former Smoker    Packs/day: 1.00    Years: 28.00    Pack years: 28.00    Types: Cigarettes    Start date: 10/21/1956    Last attempt to quit: 05/10/1985  Years since quitting: 32.3  . Smokeless tobacco: Never Used  Substance and Sexual Activity  . Alcohol use: Yes    Alcohol/week: 0.0 oz    Comment: occasional wine  . Drug use: No  . Sexual activity: Yes    Birth control/protection: None  Lifestyle  . Physical activity:    Days per week: Not on file    Minutes per session: Not on file  . Stress: Not on file  Relationships  . Social connections:    Talks on phone: Not on file    Gets together: Not on file    Attends religious service: Not on file    Active member of club or organization: Not on file    Attends meetings of clubs or organizations: Not on file    Relationship status: Not on file  . Intimate partner violence:    Fear of current or ex partner: Not on file    Emotionally abused: Not on file    Physically abused: Not on file    Forced sexual activity: Not on file  Other Topics Concern  . Not on file  Social History Narrative   Lives in [place] with spouse.   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 Other Father 31       Died from MI.  . Other Mother        alive & well  . Leukemia Brother 65       died    Review of Systems:  As  stated in the HPI and otherwise negative.   BP 108/60   Pulse 63   Ht 5\' 11"  (1.803 m)   Wt 220 lb (99.8 kg)   SpO2 96%   BMI 30.68 kg/m   Physical Examination: General: Well developed, well nourished, NAD  HEENT: OP clear, mucus membranes moist  SKIN: warm, dry. No rashes. Neuro: No focal deficits  Musculoskeletal: Muscle strength 5/5 all ext  Psychiatric: Mood and affect normal  Neck: No JVD, no carotid bruits, no thyromegaly, no lymphadenopathy.  Lungs:Clear bilaterally, no wheezes, rhonci, crackles Cardiovascular: Regular rate and rhythm. 2/6 Systolic murmur noted at the LUSB. I could not appreciate a diastolic murmur Abdomen:Soft. Bowel sounds present. Non-tender.  Extremities: No lower extremity edema. Pulses are 2 + in the bilateral DP/PT.  Echo 08/26/17: - Left ventricle: The cavity size was normal. Wall thickness was   increased in a pattern of moderate LVH. Systolic function was   normal. The estimated ejection fraction was in the range of 50%   to 55%. Poor visualization and ventricular conduction delay make   evaluation of LVEF difficult. The apex appears hypokinetic,   though poorly visualized. - Aortic valve: Moderately calcified annulus. Trileaflet;   moderately thickened leaflets. Valve area (VTI): 1.35 cm^2. Valve   area (Vmax): 1 cm^2. Valve area (Vmean): 0.96 cm^2. - Mitral valve: Moderate to severe gradient acroess the MV. Mean   grad 8 mmHg. Heart rate is 77 bpm. Valve area by pressure   half-time: 1.88 cm^2. Valve area by continuity equation (using   LVOT flow): 1.32 cm^2. - Left atrium: The atrium was mildly dilated. - Right ventricle: The cavity size was mildly dilated. Systolic   function was mildly reduced. - Atrial septum: No defect or patent foramen ovale was identified. - Pulmonary arteries: Systolic pressure was moderately increased.   PA peak pressure: 44 mm Hg (S). - Technically difficult study. Echocontrast was used  to enhance    visualization.  Left ventricle:  The cavity size was normal. Wall thickness was increased in a pattern of moderate LVH. Systolic function was normal. The estimated ejection fraction was in the range of 50% to 55%. Poor visualization and ventricular conduction delay make evaluation of LVEF difficult. Cannot evaluate diastolic function in setting of MV anular ring. Images were inadequate for LV wall motion assessment. The apex appears hypokinetic, though poorly visualized.  ------------------------------------------------------------------- Aortic valve:   Moderately calcified annulus. Trileaflet; moderately thickened leaflets.  Doppler:     VTI ratio of LVOT to aortic valve: 0.39. Valve area (VTI): 1.35 cm^2. Indexed valve area (VTI): 0.6 cm^2/m^2. Peak velocity ratio of LVOT to aortic valve: 0.29. Valve area (Vmax): 1 cm^2. Indexed valve area (Vmax): 0.44 cm^2/m^2. Mean velocity ratio of LVOT to aortic valve: 0.28. Valve area (Vmean): 0.96 cm^2. Indexed valve area (Vmean): 0.42 cm^2/m^2.    Mean gradient (S): 11 mm Hg. Peak gradient (S): 22 mm Hg.  ------------------------------------------------------------------- Aorta:  Aortic root: The aortic root was normal in size.  ------------------------------------------------------------------- Mitral valve:  There is a MV anular ring present.  Moderately thickened leaflets .  Doppler:  There was no significant regurgitation. Moderate to severe gradient acroess the MV. Mean grad 8 mmHg. Heart rate is 77 bpm.    Valve area by pressure half-time: 1.88 cm^2. Indexed valve area by pressure half-time: 0.83 cm^2/m^2. Valve area by continuity equation (using LVOT flow): 1.32 cm^2. Indexed valve area by continuity equation (using LVOT flow): 0.58 cm^2/m^2.    Mean gradient (D): 8 mm Hg. Peak gradient (D): 15 mm Hg.  ------------------------------------------------------------------- Left atrium:  The atrium was mildly  dilated.  ------------------------------------------------------------------- Atrial septum:  Poorly visualized. No defect or patent foramen ovale was identified.  ------------------------------------------------------------------- Right ventricle:  Not well visualized. The cavity size was mildly dilated. Systolic function was mildly reduced.  ------------------------------------------------------------------- Pulmonic valve:   Not well visualized.  Doppler:   There was no evidence for stenosis.   There was no significant regurgitation.   ------------------------------------------------------------------- Pulmonary artery:   Systolic pressure was moderately increased.   ------------------------------------------------------------------- Pericardium:  There was no pericardial effusion.  ------------------------------------------------------------------- Systemic veins: Inferior vena cava: The vessel was normal in size. The respirophasic diameter changes were in the normal range (= 50%), consistent with normal central venous pressure.  ------------------------------------------------------------------- Measurements   Left ventricle                          Value           Reference  LV ID, ED, PLAX chordal                 46.4   mm       43 - 52  LV ID, ES, PLAX chordal                 32.6   mm       23 - 38  LV fx shortening, PLAX chordal          30     %        >=29  LV PW thickness, ED                     12.7   mm       ----------  IVS/LV PW ratio, ED  0.93            <=1.3  Stroke volume, 2D                       53     ml       ----------  Stroke volume/bsa, 2D                   23     ml/m^2   ----------  LV filling time, D, DP                  390    ms       ----------  LV e&', lateral                          4.95   cm/s     ----------  LV E/e&', lateral                        39.6            ----------  LV e&', medial                            4.81   cm/s     ----------  LV E/e&', medial                         40.75           ----------  LV e&', average                          4.88   cm/s     ----------  LV E/e&', average                        40.16           ----------  LV ejection time                        310    ms       ----------    Ventricular septum                      Value           Reference  IVS thickness, ED                       11.8   mm       ----------    LVOT                                    Value           Reference  LVOT ID, S                              21     mm       ----------  LVOT area                               3.46  cm^2     ----------  LVOT peak velocity, S                   68.72  cm/s     ----------  LVOT mean velocity, S                   43.1   cm/s     ----------  LVOT VTI, S                             18.62  cm       ----------  Stroke volume (SV), LVOT DP             64.5   ml       ----------  Stroke index (SV/bsa), LVOT DP          28.4   ml/m^2   ----------    Aortic valve                            Value           Reference  Aortic valve peak velocity, S           236.06 cm/s     ----------  Aortic valve mean velocity, S           155.74 cm/s     ----------  Aortic valve VTI, S                     48.26  cm       ----------  Aortic mean gradient, S                 11     mm Hg    ----------  Aortic peak gradient, S                 22     mm Hg    ----------  VTI ratio, LVOT/AV                      0.39            ----------  Aortic valve area, VTI                  1.35   cm^2     ----------  Aortic valve area/bsa, VTI              0.6    cm^2/m^2 ----------  Velocity ratio, peak, LVOT/AV           0.29            ----------  Aortic valve area, peak                 1      cm^2     ----------  velocity  Aortic valve area/bsa, peak             0.44   cm^2/m^2 ----------  velocity  Velocity ratio, mean, LVOT/AV           0.28            ----------  Aortic valve area, mean                  0.96   cm^2     ----------  velocity  Aortic valve area/bsa, mean  0.42   cm^2/m^2 ----------  velocity    Aorta                                   Value           Reference  Aortic root ID, ED                      30     mm       ----------    Left atrium                             Value           Reference  LA ID, A-P, ES                          35     mm       ----------  LA ID/bsa, A-P                          1.54   cm/m^2   <=2.2  LA volume, S                            54.1   ml       ----------  LA volume/bsa, S                        23.9   ml/m^2   ----------  LA volume, ES, 1-p A4C                  63.3   ml       ----------  LA volume/bsa, ES, 1-p A4C              27.9   ml/m^2   ----------  LA volume, ES, 1-p A2C                  38.3   ml       ----------  LA volume/bsa, ES, 1-p A2C              16.9   ml/m^2   ----------    Mitral valve                            Value           Reference  Mitral E-wave peak velocity             196    cm/s     ----------  Mitral A-wave peak velocity             150    cm/s     ----------  Mitral mean velocity, D                 136    cm/s     ----------  Mitral deceleration time        (H)     356    ms       150 - 230  Mitral pressure half-time               117    ms       ----------  Mitral mean gradient, D                 8      mm Hg    ----------  Mitral peak gradient, D                 15     mm Hg    ----------  Mitral E/A ratio, peak                  1.3             ----------  Mitral valve area, PHT, DP              1.88   cm^2     ----------  Mitral valve area/bsa, PHT, DP          0.83   cm^2/m^2 ----------  Mitral valve area, LVOT                 1.32   cm^2     ----------  continuity  Mitral valve area/bsa, LVOT             0.58   cm^2/m^2 ----------  continuity  Mitral annulus VTI, D                   57.2   cm       ----------    Pulmonary arteries                      Value            Reference  PA pressure, S, DP              (H)     44     mm Hg    <=30    Tricuspid valve                         Value           Reference  Tricuspid regurg peak velocity          321    cm/s     ----------  Tricuspid peak RV-RA gradient           41     mm Hg    ----------    Right atrium                            Value           Reference  RA ID, S-I, ES, A4C                     48.2   mm       34 - 49  RA area, ES, A4C                        17.8   cm^2     8.3 - 19.5  RA volume, ES, A/L                      53     ml       ----------  RA volume/bsa, ES, A/L                  23.4   ml/m^2   ----------    Systemic veins  Value           Reference  Estimated CVP                           3      mm Hg    ----------    Right ventricle                         Value           Reference  TAPSE                                   14.6   mm       ----------  RV pressure, S, DP              (H)     44     mm Hg    <=30  RV s&', lateral, S                       8.45   cm/s     ----------  EKG:  EKG is not ordered today. The ekg ordered today demonstrates   Recent Labs: 01/25/2017: B Natriuretic Peptide 271.0 01/28/2017: BUN 29; Creatinine, Ser 0.98; Potassium 4.7; Sodium 142 08/04/2017: Hemoglobin 12.2; Platelets 399.0   Lipid Panel    Component Value Date/Time   CHOL 108 10/14/2015 0431   TRIG 157 (H) 10/14/2015 0431   HDL 34 (L) 10/14/2015 0431   CHOLHDL 3.2 10/14/2015 0431   VLDL 31 10/14/2015 0431   LDLCALC 43 10/14/2015 0431     Wt Readings from Last 3 Encounters:  09/12/17 220 lb (99.8 kg)  08/23/17 221 lb (100.2 kg)  08/04/17 218 lb (98.9 kg)     Other studies Reviewed: Additional studies/ records that were reviewed today include: . Review of the above records demonstrates:    Assessment and Plan:   1. Mitral valve disease: He is s/p mitral valve repair in June of 2017. He had an echo in November 2017 that showed a gradient of 6 mmHg across  this mitral valve. Most recent echo April 2019 showed a slight increase in the mitral valve gradient, up to 8 mmHg. It would be uncommon for his mitral valve to exhibit severe stenosis this soon after the repair. I suspect that his dyspnea is related to his anemia, pleural effusions and underlying lung disease. He is not volume overloaded on exam today. I will arrange a TEE to better define the degree of mitral valve stenosis and aortic valve stenosis and will discuss with Dr. Roxy Manns following the TEE. The TEE will be planned this Friday May 10th at [facility] Risks and benefits of procedure reviewed with pt.   2. Aortic stenosis: He has mild to moderate AS by echo April 2019. Will further assess by TEE  3. Dyspnea on exertion: His dyspnea is likely multifactorial with restrictive lung disease, bilateral pleural effusions, diastolic dysfunction and valve disease. If workup of his valves is unrevealing, will need to consider f/u in Pulmonary clinic for management of pleural effusion.   Current medicines are reviewed at length with the patient today.  The patient does not have concerns regarding medicines.  The following changes have been made:  no change  Labs/ tests ordered today include:   Orders Placed This Encounter  Procedures  .  Basic Metabolic Panel (BMET)  . CBC     Disposition:   FU will be planned based on his TEE results.    Signed, Lauree Chandler, MD 09/12/2017 9:54 AM    [facility] [place], [place], [place]  [address] Phone: [phone]; Fax: [phone]

## 2017-09-11 NOTE — Progress Notes (Signed)
Valve Clinic Consult Note  Chief Complaint  Patient presents with  . 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 He underwent mitral valve repair and 4V CABG in June 2017. He has had dyspnea since his bypass with no improvement on Lasix. He has been seen in the pulmonary office by DR. McQuaid. PFTs April 2019 with moderate restrictive lung disease. CT chest April 2019 with no interstitial lung disease but there was mild emphysema with bilateral pleural effusions. Echo arranged by the pulmonary team. No plan to address pleural effusions. Echo 08/26/17 with moderate LVH, LVEF=50-55% with possible hypokinesis of the apex, moderate aortic stenosis with mean gradient of 11 mmHg, DVI 0.28. The mitral valve leaflets are thickened. Mitral valve ring in place. Mean gradient across the mitral valve is 8 mmHg with HR of 77 bpm. No MR. Dr. Bronson Ing was notified of the echo results and referred the patient to the valve clinic for further workup. He has a permanent pacemaker in place. He is on coumadin with history of atrial fib/flutter.   The patient tells me today he has daily dyspnea with minimal exertion. This is unchanged from his surgery in 2017. In fact, his dyspnea never improved after his CABG and mitral valve repair. He has no chest pain. He has no LE edema, dizziness, near syncope or syncope.   Primary Care Physician: Monico Blitz, MD Primary Cardiologist: Dr. Bronson Ing  Past Medical History:  Diagnosis Date  . 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 Aortic  stenosis 10/14/2015  . CAD (coronary artery disease)    status post Taxus stent patency of RCA 2004, Cardiolite negative for ischemia in 2010.  . Diabetes mellitus ([place])   . Dyslipidemia   . History of kidney stones   . Hypertension   . Mitral regurgitation 10/15/2015   Moderate-severe by intra-operative TEE  . Overweight   . Right bundle branch block (RBBB) with left anterior hemiblock   . 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 Snores   . Typical atrial flutter [facility])    s/p ablation 11-23-2012 by Dr Rayann Heman    Past Surgical History:  Procedure Laterality Date  . ABLATION  11-23-2012   s/p cavotricuspid isthmus ablation by Dr Rayann Heman  . ANKLE SURGERY Left    total ankle replacement  . ATRIAL FLUTTER ABLATION N/A 11/23/2012   Procedure: ATRIAL FLUTTER ABLATION;  Surgeon: Thompson Grayer, MD;  Location: [facility] CATH LAB;  Service: Cardiovascular;  Laterality: N/A;  . CARDIAC CATHETERIZATION N/A 10/13/2015   Procedure: Left Heart Cath and Coronary Angiography;  Surgeon: Peter M Martinique, MD;  Location: [place] CV LAB;  Service: Cardiovascular;  Laterality: N/A;  . CATARACT EXTRACTION Right   . CATARACT EXTRACTION W/PHACO Left 09/13/2013  Procedure: CATARACT EXTRACTION PHACO AND INTRAOCULAR LENS PLACEMENT (IOC);  Surgeon: Tonny Branch, MD;  Location: [facility] ORS;  Service: Ophthalmology;  Laterality: Left;  CDE 9.38  . COLONOSCOPY N/A 10/10/2014   Procedure: COLONOSCOPY;  Surgeon: Rogene Houston, MD;  Location: [facility] ENDO SUITE;  Service: Endoscopy;  Laterality: N/A;  830  . CORONARY ANGIOPLASTY     3 stents  . CORONARY ARTERY BYPASS GRAFT N/A 10/15/2015   Procedure: CORONARY ARTERY BYPASS GRAFTING  (CABG) X4 LIMA-LAD; SEQ SVG-PD-PL; SVG-OM1 ENDOSCOPIC GREATER SAPHENOUS VEIN HARVEST(EVH) BILAT LOWER EXTREM;  Surgeon: Rexene Alberts, MD;  Location: [place];  Service: Open Heart Surgery;  Laterality: N/A;  . EP IMPLANTABLE DEVICE N/A 10/20/2015   Procedure: Pacemaker Implant;  Surgeon: Evans Lance, MD;  Location: [place] CV LAB;  Service: Cardiovascular;  Laterality: N/A;  . MITRAL VALVE REPAIR N/A 10/15/2015   Procedure: MITRAL VALVE REPAIR (MVR), # 28 MEMO 3-D RING ANNULOPLASTY AND COMPLEX VALVE REPAIR;  Surgeon: Rexene Alberts, MD;  Location: [place];  Service: Open Heart Surgery;  Laterality: N/A;  . TEE WITHOUT CARDIOVERSION N/A 10/15/2015   Procedure: TRANSESOPHAGEAL ECHOCARDIOGRAM (TEE);  Surgeon: Rexene Alberts, MD;  Location: [place];  Service: Open Heart Surgery;  Laterality: N/A;    Current Outpatient Medications  Medication Sig Dispense Refill  . amLODipine (NORVASC) 10 MG tablet Take 1 tablet (10 mg total) by mouth daily. 90 tablet 3  . aspirin EC 81 MG tablet Take 1 tablet (81 mg total) by mouth daily. 90 tablet 3  . ASTAXANTHIN PO Take 10 mg by mouth daily. Reported on 10/30/2015    . atorvastatin (LIPITOR) 40 MG tablet Take 1 tablet by mouth daily. Reported on 11/24/2015    . carvedilol (COREG) 6.25 MG tablet TAKE 2 TABLETS TWICE DAILY 360 tablet 3  . co-enzyme Q-10 30 MG capsule Take 100 mg by mouth 3 (three) times daily.    . furosemide (LASIX) 40 MG tablet Take 40 mg by mouth daily.     Marland Kitchen KLOR-CON M10 10 MEQ tablet TAKE 1 TABLET BY MOUTH ONCE DAILY 90 tablet 3  . lisinopril (PRINIVIL,ZESTRIL) 20 MG tablet Take 1 tablet (20 mg total) by mouth daily. 90 tablet 3  . metFORMIN (GLUCOPHAGE) 500 MG tablet Take 1,000 mg by mouth 2 (two) times daily with a meal. Reported on 10/30/2015  2  . warfarin (COUMADIN) 5 MG tablet Take 1 1/2 tablets daily except 1 tablet on Mondays, Wednesdays and Fridays (Patient taking differently: TAKE AS DIRECTED BY COUMADIN NURSE) 45 tablet 6   No  current facility-administered medications for this visit.     No Known Allergies  Social History   Socioeconomic History  . Marital status: Married    Spouse name: Not on file  . Number of children: 1  . Years of education: Not on file  . Highest education level: Not on file  Occupational History  . 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 Financial resource strain: Not on file  . Food insecurity:    Worry: Not on file    Inability: Not on file  . Transportation needs:    Medical: Not on file    Non-medical: Not on file  Tobacco Use  . Smoking status: Former Smoker    Packs/day: 1.00    Years: 28.00    Pack years: 28.00    Types: Cigarettes    Start date: 10/21/1956    Last attempt to quit: 05/10/1985  Years since quitting: 32.3  . Smokeless tobacco: Never Used  Substance and Sexual Activity  . Alcohol use: Yes    Alcohol/week: 0.0 oz    Comment: occasional wine  . Drug use: No  . Sexual activity: Yes    Birth control/protection: None  Lifestyle  . Physical activity:    Days per week: Not on file    Minutes per session: Not on file  . Stress: Not on file  Relationships  . Social connections:    Talks on phone: Not on file    Gets together: Not on file    Attends religious service: Not on file    Active member of club or organization: Not on file    Attends meetings of clubs or organizations: Not on file    Relationship status: Not on file  . Intimate partner violence:    Fear of current or ex partner: Not on file    Emotionally abused: Not on file    Physically abused: Not on file    Forced sexual activity: Not on file  Other Topics Concern  . Not on file  Social History Narrative   Lives in [place] with [place].   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 Other Father 10       Died from MI.  . Other Mother        alive & well  . Leukemia Brother 54       died    Review of Systems:  As  stated in the HPI and otherwise negative.   BP 108/60   Pulse 63   Ht 5\' 11"  (1.803 m)   Wt 220 lb (99.8 kg)   SpO2 96%   BMI 30.68 kg/m   Physical Examination: General: Well developed, well nourished, NAD  HEENT: OP clear, mucus membranes moist  SKIN: warm, dry. No rashes. Neuro: No focal deficits  Musculoskeletal: Muscle strength 5/5 all ext  Psychiatric: Mood and affect normal  Neck: No JVD, no carotid bruits, no thyromegaly, no lymphadenopathy.  Lungs:Clear bilaterally, no wheezes, rhonci, crackles Cardiovascular: Regular rate and rhythm. 2/6 Systolic murmur noted at the LUSB. I could not appreciate a diastolic murmur Abdomen:Soft. Bowel sounds present. Non-tender.  Extremities: No lower extremity edema. Pulses are 2 + in the bilateral DP/PT.  Echo 08/26/17: - Left ventricle: The cavity size was normal. Wall thickness was   increased in a pattern of moderate LVH. Systolic function was   normal. The estimated ejection fraction was in the range of 50%   to 55%. Poor visualization and ventricular conduction delay make   evaluation of LVEF difficult. The apex appears hypokinetic,   though poorly visualized. - Aortic valve: Moderately calcified annulus. Trileaflet;   moderately thickened leaflets. Valve area (VTI): 1.35 cm^2. Valve   area (Vmax): 1 cm^2. Valve area (Vmean): 0.96 cm^2. - Mitral valve: Moderate to severe gradient acroess the MV. Mean   grad 8 mmHg. Heart rate is 77 bpm. Valve area by pressure   half-time: 1.88 cm^2. Valve area by continuity equation (using   LVOT flow): 1.32 cm^2. - Left atrium: The atrium was mildly dilated. - Right ventricle: The cavity size was mildly dilated. Systolic   function was mildly reduced. - Atrial septum: No defect or patent foramen ovale was identified. - Pulmonary arteries: Systolic pressure was moderately increased.   PA peak pressure: 44 mm Hg (S). - Technically difficult study. Echocontrast was used  to enhance    visualization.  Left ventricle:  The cavity size was normal. Wall thickness was increased in a pattern of moderate LVH. Systolic function was normal. The estimated ejection fraction was in the range of 50% to 55%. Poor visualization and ventricular conduction delay make evaluation of LVEF difficult. Cannot evaluate diastolic function in setting of MV anular ring. Images were inadequate for LV wall motion assessment. The apex appears hypokinetic, though poorly visualized.  ------------------------------------------------------------------- Aortic valve:   Moderately calcified annulus. Trileaflet; moderately thickened leaflets.  Doppler:     VTI ratio of LVOT to aortic valve: 0.39. Valve area (VTI): 1.35 cm^2. Indexed valve area (VTI): 0.6 cm^2/m^2. Peak velocity ratio of LVOT to aortic valve: 0.29. Valve area (Vmax): 1 cm^2. Indexed valve area (Vmax): 0.44 cm^2/m^2. Mean velocity ratio of LVOT to aortic valve: 0.28. Valve area (Vmean): 0.96 cm^2. Indexed valve area (Vmean): 0.42 cm^2/m^2.    Mean gradient (S): 11 mm Hg. Peak gradient (S): 22 mm Hg.  ------------------------------------------------------------------- Aorta:  Aortic root: The aortic root was normal in size.  ------------------------------------------------------------------- Mitral valve:  There is a MV anular ring present.  Moderately thickened leaflets .  Doppler:  There was no significant regurgitation. Moderate to severe gradient acroess the MV. Mean grad 8 mmHg. Heart rate is 77 bpm.    Valve area by pressure half-time: 1.88 cm^2. Indexed valve area by pressure half-time: 0.83 cm^2/m^2. Valve area by continuity equation (using LVOT flow): 1.32 cm^2. Indexed valve area by continuity equation (using LVOT flow): 0.58 cm^2/m^2.    Mean gradient (D): 8 mm Hg. Peak gradient (D): 15 mm Hg.  ------------------------------------------------------------------- Left atrium:  The atrium was mildly  dilated.  ------------------------------------------------------------------- Atrial septum:  Poorly visualized. No defect or patent foramen ovale was identified.  ------------------------------------------------------------------- Right ventricle:  Not well visualized. The cavity size was mildly dilated. Systolic function was mildly reduced.  ------------------------------------------------------------------- Pulmonic valve:   Not well visualized.  Doppler:   There was no evidence for stenosis.   There was no significant regurgitation.   ------------------------------------------------------------------- Pulmonary artery:   Systolic pressure was moderately increased.   ------------------------------------------------------------------- Pericardium:  There was no pericardial effusion.  ------------------------------------------------------------------- Systemic veins: Inferior vena cava: The vessel was normal in size. The respirophasic diameter changes were in the normal range (= 50%), consistent with normal central venous pressure.  ------------------------------------------------------------------- Measurements   Left ventricle                          Value           Reference  LV ID, ED, PLAX chordal                 46.4   mm       43 - 52  LV ID, ES, PLAX chordal                 32.6   mm       23 - 38  LV fx shortening, PLAX chordal          30     %        >=29  LV PW thickness, ED                     12.7   mm       ----------  IVS/LV PW ratio, ED  0.93            <=1.3  Stroke volume, 2D                       53     ml       ----------  Stroke volume/bsa, 2D                   23     ml/m^2   ----------  LV filling time, D, DP                  390    ms       ----------  LV e&', lateral                          4.95   cm/s     ----------  LV E/e&', lateral                        39.6            ----------  LV e&', medial                            4.81   cm/s     ----------  LV E/e&', medial                         40.75           ----------  LV e&', average                          4.88   cm/s     ----------  LV E/e&', average                        40.16           ----------  LV ejection time                        310    ms       ----------    Ventricular septum                      Value           Reference  IVS thickness, ED                       11.8   mm       ----------    LVOT                                    Value           Reference  LVOT ID, S                              21     mm       ----------  LVOT area                               3.46  cm^2     ----------  LVOT peak velocity, S                   68.72  cm/s     ----------  LVOT mean velocity, S                   43.1   cm/s     ----------  LVOT VTI, S                             18.62  cm       ----------  Stroke volume (SV), LVOT DP             64.5   ml       ----------  Stroke index (SV/bsa), LVOT DP          28.4   ml/m^2   ----------    Aortic valve                            Value           Reference  Aortic valve peak velocity, S           236.06 cm/s     ----------  Aortic valve mean velocity, S           155.74 cm/s     ----------  Aortic valve VTI, S                     48.26  cm       ----------  Aortic mean gradient, S                 11     mm Hg    ----------  Aortic peak gradient, S                 22     mm Hg    ----------  VTI ratio, LVOT/AV                      0.39            ----------  Aortic valve area, VTI                  1.35   cm^2     ----------  Aortic valve area/bsa, VTI              0.6    cm^2/m^2 ----------  Velocity ratio, peak, LVOT/AV           0.29            ----------  Aortic valve area, peak                 1      cm^2     ----------  velocity  Aortic valve area/bsa, peak             0.44   cm^2/m^2 ----------  velocity  Velocity ratio, mean, LVOT/AV           0.28            ----------  Aortic valve area, mean                  0.96   cm^2     ----------  velocity  Aortic valve area/bsa, mean  0.42   cm^2/m^2 ----------  velocity    Aorta                                   Value           Reference  Aortic root ID, ED                      30     mm       ----------    Left atrium                             Value           Reference  LA ID, A-P, ES                          35     mm       ----------  LA ID/bsa, A-P                          1.54   cm/m^2   <=2.2  LA volume, S                            54.1   ml       ----------  LA volume/bsa, S                        23.9   ml/m^2   ----------  LA volume, ES, 1-p A4C                  63.3   ml       ----------  LA volume/bsa, ES, 1-p A4C              27.9   ml/m^2   ----------  LA volume, ES, 1-p A2C                  38.3   ml       ----------  LA volume/bsa, ES, 1-p A2C              16.9   ml/m^2   ----------    Mitral valve                            Value           Reference  Mitral E-wave peak velocity             196    cm/s     ----------  Mitral A-wave peak velocity             150    cm/s     ----------  Mitral mean velocity, D                 136    cm/s     ----------  Mitral deceleration time        (H)     356    ms       150 - 230  Mitral pressure half-time               117    ms       ----------  Mitral mean gradient, D                 8      mm Hg    ----------  Mitral peak gradient, D                 15     mm Hg    ----------  Mitral E/A ratio, peak                  1.3             ----------  Mitral valve area, PHT, DP              1.88   cm^2     ----------  Mitral valve area/bsa, PHT, DP          0.83   cm^2/m^2 ----------  Mitral valve area, LVOT                 1.32   cm^2     ----------  continuity  Mitral valve area/bsa, LVOT             0.58   cm^2/m^2 ----------  continuity  Mitral annulus VTI, D                   57.2   cm       ----------    Pulmonary arteries                      Value            Reference  PA pressure, S, DP              (H)     44     mm Hg    <=30    Tricuspid valve                         Value           Reference  Tricuspid regurg peak velocity          321    cm/s     ----------  Tricuspid peak RV-RA gradient           41     mm Hg    ----------    Right atrium                            Value           Reference  RA ID, S-I, ES, A4C                     48.2   mm       34 - 49  RA area, ES, A4C                        17.8   cm^2     8.3 - 19.5  RA volume, ES, A/L                      53     ml       ----------  RA volume/bsa, ES, A/L                  23.4   ml/m^2   ----------    Systemic veins  Value           Reference  Estimated CVP                           3      mm Hg    ----------    Right ventricle                         Value           Reference  TAPSE                                   14.6   mm       ----------  RV pressure, S, DP              (H)     44     mm Hg    <=30  RV s&', lateral, S                       8.45   cm/s     ----------  EKG:  EKG is not ordered today. The ekg ordered today demonstrates   Recent Labs: 01/25/2017: B Natriuretic Peptide 271.0 01/28/2017: BUN 29; Creatinine, Ser 0.98; Potassium 4.7; Sodium 142 08/04/2017: Hemoglobin 12.2; Platelets 399.0   Lipid Panel    Component Value Date/Time   CHOL 108 10/14/2015 0431   TRIG 157 (H) 10/14/2015 0431   HDL 34 (L) 10/14/2015 0431   CHOLHDL 3.2 10/14/2015 0431   VLDL 31 10/14/2015 0431   LDLCALC 43 10/14/2015 0431     Wt Readings from Last 3 Encounters:  09/12/17 220 lb (99.8 kg)  08/23/17 221 lb (100.2 kg)  08/04/17 218 lb (98.9 kg)     Other studies Reviewed: Additional studies/ records that were reviewed today include: . Review of the above records demonstrates:    Assessment and Plan:   1. Mitral valve disease: He is s/p mitral valve repair in June of 2017. He had an echo in November 2017 that showed a gradient of 6 mmHg across  this mitral valve. Most recent echo April 2019 showed a slight increase in the mitral valve gradient, up to 8 mmHg. It would be uncommon for his mitral valve to exhibit severe stenosis this soon after the repair. I suspect that his dyspnea is related to his anemia, pleural effusions and underlying lung disease. He is not volume overloaded on exam today. I will arrange a TEE to better define the degree of mitral valve stenosis and aortic valve stenosis and will discuss with Dr. Roxy Manns following the TEE. The TEE will be planned this Friday May 10th at [facility]. Risks and benefits of procedure reviewed with pt.   2. Aortic stenosis: He has mild to moderate AS by echo April 2019. Will further assess by TEE  3. Dyspnea on exertion: His dyspnea is likely multifactorial with restrictive lung disease, bilateral pleural effusions, diastolic dysfunction and valve disease. If workup of his valves is unrevealing, will need to consider f/u in Pulmonary clinic for management of pleural effusion.   Current medicines are reviewed at length with the patient today.  The patient does not have concerns regarding medicines.  The following changes have been made:  no change  Labs/ tests ordered today include:   Orders Placed This Encounter  Procedures  .  Basic Metabolic Panel (BMET)  . CBC     Disposition:   FU will be planned based on his TEE results.    Signed, Lauree Chandler, MD 09/12/2017 9:54 AM    [facility] [address], [place], [place]  [address] Phone: [phone]; Fax: [phone]

## 2017-09-12 ENCOUNTER — Telehealth: Payer: Self-pay | Admitting: Cardiovascular Disease

## 2017-09-12 ENCOUNTER — Ambulatory Visit (INDEPENDENT_AMBULATORY_CARE_PROVIDER_SITE_OTHER): Payer: Medicare Other | Admitting: Cardiovascular Disease

## 2017-09-12 ENCOUNTER — Encounter: Payer: Self-pay | Admitting: Cardiovascular Disease

## 2017-09-12 VITALS — BP 108/60 | HR 63 | Ht 71.0 in | Wt 220.0 lb

## 2017-09-12 DIAGNOSIS — I350 Nonrheumatic aortic (valve) stenosis: Secondary | ICD-10-CM

## 2017-09-12 DIAGNOSIS — R0609 Other forms of dyspnea: Secondary | ICD-10-CM

## 2017-09-12 DIAGNOSIS — I059 Rheumatic mitral valve disease, unspecified: Secondary | ICD-10-CM | POA: Diagnosis not present

## 2017-09-12 NOTE — Telephone Encounter (Signed)
I spoke with pt.  He does not need to reschedule procedure but is asking when he would go home after TEE.  I told him he would be able to go home early that afternoon.  He will be at hospital for procedure as planned on 5/10

## 2017-09-12 NOTE — Patient Instructions (Addendum)
Medication Instructions:  Your physician recommends that you continue on your current medications as directed. Please refer to the Current Medication list given to you today.   Labwork: BMP and CBC    Testing/Procedures: Your physician has requested that you have a TEE. During a TEE, sound waves are used to create images of your heart. It provides your doctor with information about the size and shape of your heart and how well your heart's chambers and valves are working. In this test, a transducer is attached to the end of a flexible tube that's guided down your throat and into your esophagus (the tube leading from you mouth to your stomach) to get a more detailed image of your heart. You are not awake for the procedure. Please see the instructions below.  For further information please visit [email or web address].  Scheduled for May 10,2019    Follow-Up: to be arranged after procedure.    Any Other Special Instructions Will Be Listed Below (If Applicable).    You are scheduled for a TEE on May 10,2019 with Dr. Meda Coffee.  Please arrive at the [facility] (Main Entrance A) at [facility]: [address] [place], [place] [address] at 8 AM   DIET: Nothing to eat or drink after midnight except a sip of water with medications    Labs: done in office on 5/6   You must have a responsible person to drive you home and stay in the waiting area during your procedure. Failure to do so could result in cancellation.  Bring your insurance cards.  *Special Note: Every effort is made to have your procedure done on time. Occasionally there are emergencies that occur at the hospital that may cause delays. Please be patient if a delay does occur.    If you need a refill on your cardiac medications before your next appointment, please call your pharmacy.

## 2017-09-12 NOTE — Telephone Encounter (Signed)
New message    Patient calling to reschedule 5/10 procedure

## 2017-09-13 ENCOUNTER — Ambulatory Visit (INDEPENDENT_AMBULATORY_CARE_PROVIDER_SITE_OTHER): Payer: Medicare Other | Admitting: *Deleted

## 2017-09-13 DIAGNOSIS — I4892 Unspecified atrial flutter: Secondary | ICD-10-CM

## 2017-09-13 DIAGNOSIS — Z5181 Encounter for therapeutic drug level monitoring: Secondary | ICD-10-CM | POA: Diagnosis not present

## 2017-09-13 LAB — CBC
HEMATOCRIT: 38.2 % (ref 37.5–51.0)
Hemoglobin: 12.9 g/dL — ABNORMAL LOW (ref 13.0–17.7)
MCH: 29.3 pg (ref 26.6–33.0)
MCHC: 33.8 g/dL (ref 31.5–35.7)
MCV: 87 fL (ref 79–97)
NRBC: 1 % — ABNORMAL HIGH (ref 0–0)
PLATELETS: 323 10*3/uL (ref 150–379)
RBC: 4.4 x10E6/uL (ref 4.14–5.80)
RDW: 16.1 % — AB (ref 12.3–15.4)
WBC: 12.9 10*3/uL — ABNORMAL HIGH (ref 3.4–10.8)

## 2017-09-13 LAB — BASIC METABOLIC PANEL
BUN / CREAT RATIO: 20 (ref 10–24)
BUN: 21 mg/dL (ref 8–27)
CALCIUM: 10.1 mg/dL (ref 8.6–10.2)
CO2: 22 mmol/L (ref 20–29)
CREATININE: 1.05 mg/dL (ref 0.76–1.27)
Chloride: 102 mmol/L (ref 96–106)
GFR calc non Af Amer: 70 mL/min/{1.73_m2} (ref >59)
GFR, EST AFRICAN AMERICAN: 80 mL/min/{1.73_m2} (ref >59)
GLUCOSE: 88 mg/dL (ref 65–99)
POTASSIUM: 4.8 mmol/L (ref 3.5–5.2)
SODIUM: 142 mmol/L (ref 134–144)

## 2017-09-13 LAB — POCT INR: INR: 3.4

## 2017-09-13 NOTE — Patient Instructions (Signed)
Take coumadin 1/2 tablet tonight then decrease dose to 1 1/2 tablets daily except 1 tablet on Mondays and Fridays Recheck in 2 weeks

## 2017-09-16 ENCOUNTER — Ambulatory Visit (HOSPITAL_BASED_OUTPATIENT_CLINIC_OR_DEPARTMENT_OTHER): Payer: Medicare Other

## 2017-09-16 ENCOUNTER — Encounter (HOSPITAL_COMMUNITY)
Admission: RE | Disposition: A | Discharge status: Home / Self Care | Payer: Self-pay | Source: Ambulatory Visit | Attending: Cardiology

## 2017-09-16 ENCOUNTER — Ambulatory Visit (HOSPITAL_COMMUNITY)
Admission: RE | Admit: 2017-09-16 | Discharge: 2017-09-16 | Disposition: A | Discharge status: Home / Self Care | Payer: Medicare Other | Source: Ambulatory Visit | Attending: Cardiology | Admitting: Cardiology

## 2017-09-16 ENCOUNTER — Other Ambulatory Visit: Payer: Self-pay

## 2017-09-16 ENCOUNTER — Encounter (HOSPITAL_COMMUNITY): Payer: Self-pay

## 2017-09-16 DIAGNOSIS — I110 Hypertensive heart disease with heart failure: Secondary | ICD-10-CM | POA: Diagnosis not present

## 2017-09-16 DIAGNOSIS — I342 Nonrheumatic mitral (valve) stenosis: Secondary | ICD-10-CM | POA: Diagnosis not present

## 2017-09-16 DIAGNOSIS — I2510 Atherosclerotic heart disease of native coronary artery without angina pectoris: Secondary | ICD-10-CM | POA: Insufficient documentation

## 2017-09-16 DIAGNOSIS — I080 Rheumatic disorders of both mitral and aortic valves: Secondary | ICD-10-CM | POA: Diagnosis not present

## 2017-09-16 DIAGNOSIS — I5042 Chronic combined systolic (congestive) and diastolic (congestive) heart failure: Secondary | ICD-10-CM | POA: Diagnosis not present

## 2017-09-16 DIAGNOSIS — J984 Other disorders of lung: Secondary | ICD-10-CM | POA: Diagnosis not present

## 2017-09-16 DIAGNOSIS — Z6830 Body mass index (BMI) 30.0-30.9, adult: Secondary | ICD-10-CM | POA: Insufficient documentation

## 2017-09-16 DIAGNOSIS — Z7901 Long term (current) use of anticoagulants: Secondary | ICD-10-CM | POA: Insufficient documentation

## 2017-09-16 DIAGNOSIS — E119 Type 2 diabetes mellitus without complications: Secondary | ICD-10-CM | POA: Diagnosis not present

## 2017-09-16 DIAGNOSIS — Z87891 Personal history of nicotine dependence: Secondary | ICD-10-CM | POA: Insufficient documentation

## 2017-09-16 DIAGNOSIS — I4510 Unspecified right bundle-branch block: Secondary | ICD-10-CM | POA: Diagnosis not present

## 2017-09-16 DIAGNOSIS — Z951 Presence of aortocoronary bypass graft: Secondary | ICD-10-CM | POA: Diagnosis not present

## 2017-09-16 DIAGNOSIS — E669 Obesity, unspecified: Secondary | ICD-10-CM | POA: Insufficient documentation

## 2017-09-16 DIAGNOSIS — I350 Nonrheumatic aortic (valve) stenosis: Secondary | ICD-10-CM

## 2017-09-16 DIAGNOSIS — I059 Rheumatic mitral valve disease, unspecified: Secondary | ICD-10-CM

## 2017-09-16 DIAGNOSIS — Z952 Presence of prosthetic heart valve: Secondary | ICD-10-CM | POA: Diagnosis not present

## 2017-09-16 DIAGNOSIS — E785 Hyperlipidemia, unspecified: Secondary | ICD-10-CM | POA: Diagnosis not present

## 2017-09-16 DIAGNOSIS — I359 Nonrheumatic aortic valve disorder, unspecified: Secondary | ICD-10-CM

## 2017-09-16 DIAGNOSIS — Z7982 Long term (current) use of aspirin: Secondary | ICD-10-CM | POA: Diagnosis not present

## 2017-09-16 DIAGNOSIS — Z950 Presence of cardiac pacemaker: Secondary | ICD-10-CM | POA: Diagnosis not present

## 2017-09-16 DIAGNOSIS — Z7984 Long term (current) use of oral hypoglycemic drugs: Secondary | ICD-10-CM | POA: Insufficient documentation

## 2017-09-16 HISTORY — PX: TEE WITHOUT CARDIOVERSION: SHX5443

## 2017-09-16 LAB — GLUCOSE, CAPILLARY: Glucose-Capillary: 122 mg/dL — ABNORMAL HIGH (ref 65–99)

## 2017-09-16 SURGERY — ECHOCARDIOGRAM, TRANSESOPHAGEAL
Anesthesia: Moderate Sedation

## 2017-09-16 MED ORDER — SODIUM CHLORIDE 0.9 % IV SOLN
INTRAVENOUS | Status: DC
  Administered 2017-09-16: 500 mL via INTRAVENOUS

## 2017-09-16 MED ORDER — MIDAZOLAM HCL 5 MG/ML IJ SOLN
INTRAMUSCULAR | Status: AC
  Filled 2017-09-16: qty 2

## 2017-09-16 MED ORDER — FENTANYL CITRATE (PF) 100 MCG/2ML IJ SOLN
INTRAMUSCULAR | Status: AC
  Filled 2017-09-16: qty 2

## 2017-09-16 MED ORDER — BUTAMBEN-TETRACAINE-BENZOCAINE 2-2-14 % EX AERO
INHALATION_SPRAY | CUTANEOUS | Status: DC | PRN
  Administered 2017-09-16: 2 via TOPICAL

## 2017-09-16 MED ORDER — FENTANYL CITRATE (PF) 100 MCG/2ML IJ SOLN
INTRAMUSCULAR | Status: DC | PRN
  Administered 2017-09-16 (×3): 25 ug via INTRAVENOUS

## 2017-09-16 MED ORDER — MIDAZOLAM HCL 10 MG/2ML IJ SOLN
INTRAMUSCULAR | Status: DC | PRN
  Administered 2017-09-16 (×3): 2 mg via INTRAVENOUS

## 2017-09-16 NOTE — Interval H&P Note (Signed)
History and Physical Interval Note:  09/16/2017 8:27 AM  Christian Rasmussen  has presented today for surgery, with the diagnosis of MITRAL VALVE DISEAE  The various methods of treatment have been discussed with the patient and family. After consideration of risks, benefits and other options for treatment, the patient has consented to  Procedure(s): TRANSESOPHAGEAL ECHOCARDIOGRAM (TEE) (N/A) as a surgical intervention .  The patient's history has been reviewed, patient examined, no change in status, stable for surgery.  I have reviewed the patient's chart and labs.  Questions were answered to the patient's satisfaction.     Ena Dawley

## 2017-09-16 NOTE — Progress Notes (Signed)
  Echocardiogram Echocardiogram Transesophageal has been performed.  Jennette Dubin 09/16/2017, 10:12 AM

## 2017-09-16 NOTE — Discharge Instructions (Signed)

## 2017-09-16 NOTE — CV Procedure (Signed)
     Transesophageal Echocardiogram Note  BETH SPACKMAN [id number] 08-14-1940  Procedure: Transesophageal Echocardiogram Indications: mitral valve disease, aortic valve disease  Procedure Details Consent: Obtained Time Out: Verified patient identification, verified procedure, site/side was marked, verified correct patient position, special equipment/implants available, Radiology Safety Procedures followed,  medications/allergies/relevent history reviewed, required imaging and test results available.  Performed  Medications: During this procedure the patient is administered a total of Versed 6 mg and Fentanyl 75 mcg to achieve and maintain moderate conscious sedation.  The patient's heart rate, blood pressure, and oxygen saturation are monitored continuously during the procedure. The period of conscious sedation is 35 minutes, of which I was present face-to-face 100% of this time.  S/P mitral valve repair. Leaflets appear thickened but open well, traced MVA > 3 cm2.  Transmitral gradients are in 8-11 mmHg range (HR 93 BPM), however this most certainly doesn't represent severe mitral stenosis, other factors such as anemia might be contributing to high gradient.   Aortic valve is moderately thickened and calcified, predominantly in the non-coronary leaflet that is fixed, left and right coronary leaflets are fairly mobile, there is mild to moderate aortic stenosis and moderate aortic regurgitation.   RVSP 49 mmHg, consistent with moderate pulmonary hypertension.   Complications: No apparent complications Patient did tolerate procedure well.  Ena Dawley, MD, [facility] 09/16/2017, 8:58 AM

## 2017-09-18 ENCOUNTER — Encounter (HOSPITAL_COMMUNITY): Payer: Self-pay | Admitting: Cardiology

## 2017-09-27 ENCOUNTER — Ambulatory Visit (INDEPENDENT_AMBULATORY_CARE_PROVIDER_SITE_OTHER): Payer: Medicare Other | Admitting: *Deleted

## 2017-09-27 DIAGNOSIS — Z5181 Encounter for therapeutic drug level monitoring: Secondary | ICD-10-CM | POA: Diagnosis not present

## 2017-09-27 DIAGNOSIS — I4892 Unspecified atrial flutter: Secondary | ICD-10-CM | POA: Diagnosis not present

## 2017-09-27 LAB — POCT INR: INR: 2.4 (ref 2.0–3.0)

## 2017-09-27 NOTE — Patient Instructions (Signed)
Continue coumadin 1 1/2 tablets daily except 1 tablet on Mondays and Fridays Recheck in 3 weeks

## 2017-10-18 ENCOUNTER — Ambulatory Visit (INDEPENDENT_AMBULATORY_CARE_PROVIDER_SITE_OTHER): Payer: Medicare Other | Admitting: *Deleted

## 2017-10-18 DIAGNOSIS — Z5181 Encounter for therapeutic drug level monitoring: Secondary | ICD-10-CM

## 2017-10-18 DIAGNOSIS — I4892 Unspecified atrial flutter: Secondary | ICD-10-CM

## 2017-10-18 LAB — POCT INR: INR: 2.4 (ref 2.0–3.0)

## 2017-10-18 MED ORDER — WARFARIN SODIUM 5 MG PO TABS: ORAL_TABLET | ORAL | 3 refills | Status: DC

## 2017-10-18 NOTE — Patient Instructions (Signed)
Continue coumadin 1 1/2 tablets daily except 1 tablet on Mondays and Fridays Recheck in 4 weeks 

## 2017-10-24 ENCOUNTER — Ambulatory Visit (INDEPENDENT_AMBULATORY_CARE_PROVIDER_SITE_OTHER): Payer: Medicare Other | Admitting: *Deleted

## 2017-10-24 DIAGNOSIS — R001 Bradycardia, unspecified: Secondary | ICD-10-CM

## 2017-10-24 NOTE — Progress Notes (Signed)
Remote pacemaker transmission.   

## 2017-10-31 ENCOUNTER — Other Ambulatory Visit: Payer: Self-pay | Admitting: Cardiovascular Disease

## 2017-11-02 ENCOUNTER — Ambulatory Visit (INDEPENDENT_AMBULATORY_CARE_PROVIDER_SITE_OTHER): Payer: Medicare Other | Admitting: Pulmonary Disease

## 2017-11-02 ENCOUNTER — Encounter: Payer: Self-pay | Admitting: Pulmonary Disease

## 2017-11-02 VITALS — BP 112/64 | HR 88 | Ht 71.0 in | Wt 222.0 lb

## 2017-11-02 DIAGNOSIS — R400 Somnolence: Secondary | ICD-10-CM | POA: Diagnosis not present

## 2017-11-02 DIAGNOSIS — R0609 Other forms of dyspnea: Secondary | ICD-10-CM | POA: Diagnosis not present

## 2017-11-02 DIAGNOSIS — I272 Pulmonary hypertension, unspecified: Secondary | ICD-10-CM | POA: Diagnosis not present

## 2017-11-02 DIAGNOSIS — J90 Pleural effusion, not elsewhere classified: Secondary | ICD-10-CM

## 2017-11-02 NOTE — Patient Instructions (Signed)
Pulmonary hypertension: I will talk to your cardiologist about whether or not we need to do a right heart catheterization  Daytime sleepiness: We will arrange for a sleep study to look for evidence of obstructive sleep apnea  Centrilobular emphysema: At this time nothing further necessary to do, it is important for you to get a flu shot every year to prevent getting a severe episode of bronchitis  Moderate mitral stenosis and atrial regurgitation: Discussed with cardiology  We will see you back in about 4 weeks to go over the results of the sleep study

## 2017-11-02 NOTE — Progress Notes (Signed)
Subjective:   PATIENT ID: Christian Rasmussen GENDER: male DOB: [birth year], MRN: [id number]  Synopsis: Referred in 07/2017 for dyspnea, he has a past medical history significant for systolic heart failure, coronary artery disease and mitral valve repair in 2017 and pacer placed.  He also has atrial fibrillation.  He smoked cigarettes until age 77.  He was found on a high-resolution CT scan in 2019 to have mild centrilobular emphysema and a left-sided pleural effusion which seems to have been present since his coronary artery bypass surgery in 2017.  HPI  Chief Complaint  Patient presents with  . Follow-up    ROV, continues to have shortness of breath, plan for thoracentisis    Mr. Cho is here for follow-up with his shortness of breath.  Since the last visit he was found to have a left-sided pleural effusion.  He has also been seen by cardiology and had a TEE in May 2019 which showed moderate stenosis, LVEF 50 to 55%.  He says that he is not exercising routinely.  He does not have problems with cough.  He still feels short of breath when he climbs a hill. His wife says that he snores some, she has not noticed him having periods of apnea.  He does nap "all day long".  Past Medical History:  Diagnosis Date  . 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 Aortic stenosis 10/14/2015  . CAD (coronary artery disease)    status post Taxus stent patency of RCA 2004, Cardiolite negative for ischemia in 2010.  . Diabetes mellitus ([place])   . Dyslipidemia   . History of kidney stones   . Hypertension   . Mitral regurgitation 10/15/2015   Moderate-severe by intra-operative TEE  . Overweight   . Right bundle branch block (RBBB) with left anterior hemiblock   . 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 Snores   . Typical atrial flutter [facility])    s/p ablation 11-23-2012 by Dr Rayann Heman      Review of Systems  Constitutional: Positive for malaise/fatigue. Negative for chills and diaphoresis.  HENT: Negative for congestion, ear discharge and nosebleeds.   Respiratory: Positive for shortness of breath. Negative for cough and wheezing.   Cardiovascular: Negative for chest pain, claudication and leg swelling.      Objective:  Physical Exam   Vitals:   11/02/17 1039  BP: 112/64  Pulse: 88  SpO2: 91%  Weight: 222 lb (100.7 kg)  Height: 5\' 11"  (1.803 m)   RA  Gen: well appearing HENT: OP clear, TM's clear, neck supple PULM: Diminished left base, CTA B, normal percussion CV: RRR, no mgr, trace edema GI: BS+, soft, nontender Derm: no cyanosis or rash Psyche: normal mood and affect   CBC    Component Value Date/Time   WBC 12.9 (H) 09/12/2017 0958   WBC 11.9 (H) 08/04/2017 1118   RBC 4.40 09/12/2017 0958   RBC 4.24 08/04/2017 1118   HGB 12.9 (L) 09/12/2017 0958   HCT 38.2 09/12/2017 0958   PLT 323 09/12/2017 0958   MCV 87 09/12/2017 0958   MCH 29.3 09/12/2017 0958   MCH 29.5 04/28/2016 2158  MCHC 33.8 09/12/2017 0958   MCHC 32.8 08/04/2017 1118   RDW 16.1 (H) 09/12/2017 0958   LYMPHSABS 1.0 08/04/2017 1118   MONOABS 0.9 08/04/2017 1118   EOSABS 0.1 08/04/2017 1118   BASOSABS 0.1 08/04/2017 1118     Chest imaging: Feb 2019 chest imaging shows a pacemaker, cardiomegaly, chronic appearing left-sided pleural effusion April 2019 high-resolution CT chest images independently reviewed showing no evidence of interstitial lung disease, some mild centrilobular emphysema, small pleural effusion noted on the left  PFT: June 2017 PFT: Ratio 76%, FEV1 2.2 L 66% predicted, FVC 2.83 L 63% predicted, total lung capacity 5.4 L 74% predicted, DLCO 21.1 mL  62% predicted  Labs:  Path:  Echo: Sep 16, 2017 trans-esophageal echocardiogram showed an LVEF of 50 to 55%, PA pressure estimate of 49 mmHg, moderate aortic regurgitation and moderate mitral stenosis   Heart Catheterization:  Cardiology records from February 2019 reviewed where he was seen for coronary artery disease, chronic systolic heart failure, and a history of mitral valve repair.  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  Dr. Lovena Le placed a pacemaker.  He has atrial fibrillation.     Assessment & Plan:   Daytime somnolence - Plan: Home sleep test  Pleural effusion - Plan: US THORACENTESIS ASP PLEURAL SPACE W/IMG GUIDE  Dyspnea on exertion  Pulmonary hypertension, low resistance (HCC)  Discussion: Mr. Lajuan Lines has mild centrilobular emphysema and a small pleural effusion.  While these certainly could contribute to his shortness of breath, neither seem significant enough to be the primary cause for his dyspnea.  I think that his dyspnea is a complicated problem and is likely related to these underlying lung problems, probably somewhat related to his moderate mitral stenosis and moderate aortic regurgitation and pulmonary hypertension, and predominantly due to being overweight and deconditioned.  I told him today that no matter what the plan for him in the future may be that exercise is incredibly important.  I think he probably needs exercise and a monitored environment like cardiac rehab.  I am going to try to sample the fluid from his left lung, though being cautious because it has been present for several years.  Typically after being present for this long the fluid will be very unlikely to able to be drained completely as the likelihood of lung entrapment is quite high.  However I think it is worthwhile to sample the fluid to make sure were not dealing with some sort of occult infection or malignancy (very unlikely).  Because of the pulmonary hypertension  I am going to arrange for a sleep study, he certainly has symptoms to support that.  However, this all being said with the pulmonary hypertension seen on the transesophageal echocardiogram I think he likely needs to have a right heart catheterization.  I will discuss this with his cardiologist.  Plan: Pulmonary hypertension: I will talk to your cardiologist about whether or not we need to do a right heart catheterization  Daytime sleepiness: We will arrange for a sleep study to look for evidence of obstructive sleep apnea  Centrilobular emphysema: At this time nothing further necessary to do, it is important for you to get a flu shot every year to prevent getting a severe episode of bronchitis  Moderate mitral stenosis and atrial regurgitation: Discussed with cardiology  We will see you back in about 4 weeks to go over the results of the sleep study  Current Outpatient Medications:  .  amLODipine (  NORVASC) 10 MG tablet, Take 1 tablet (10 mg total) by mouth daily., Disp: 90 tablet, Rfl: 3 .  aspirin EC 81 MG tablet, Take 1 tablet (81 mg total) by mouth daily., Disp: 90 tablet, Rfl: 3 .  ASTAXANTHIN PO, Take 10 mg by mouth daily. , Disp: , Rfl:  .  atorvastatin (LIPITOR) 40 MG tablet, Take 40 mg by mouth daily. , Disp: , Rfl:  .  carvedilol (COREG) 6.25 MG tablet, TAKE 2 TABLETS TWICE DAILY, Disp: 360 tablet, Rfl: 3 .  Coenzyme Q10 100 MG capsule, Take 100 mg by mouth 3 (three) times daily., Disp: , Rfl:  .  furosemide (LASIX) 40 MG tablet, Take 1 tablet (40 mg total) by mouth daily., Disp: 90 tablet, Rfl: 3 .  KLOR-CON M10 10 MEQ tablet, TAKE 1 TABLET BY MOUTH ONCE DAILY, Disp: 90 tablet, Rfl: 3 .  lisinopril (PRINIVIL,ZESTRIL) 20 MG tablet, Take 1 tablet (20 mg total) by mouth daily., Disp: 90 tablet, Rfl: 3 .  metFORMIN (GLUCOPHAGE) 500 MG tablet, Take 1,000 mg by mouth 2 (two) times daily with a meal. , Disp: , Rfl: 2 .  warfarin (COUMADIN) 5 MG tablet, Take 1 1/2 tablets daily except 1  tablet on Mondays and Fridays, Disp: 135 tablet, Rfl: 3

## 2017-11-03 ENCOUNTER — Telehealth: Payer: Self-pay | Admitting: *Deleted

## 2017-11-03 ENCOUNTER — Telehealth: Payer: Self-pay | Admitting: Pulmonary Disease

## 2017-11-03 NOTE — Telephone Encounter (Signed)
BQ is out of the office at this time. RA please advise when patient needs to stop Coumadin for US Thoracentesis on 11-09-17.  Thanks.

## 2017-11-03 NOTE — Telephone Encounter (Signed)
Pt called to inform me he is scheduled for thoracentesis on 11/09/17.  Was told to take his last dose of coumadin on 6/28 and restart night of procedure if ok with interventional MD.

## 2017-11-03 NOTE — Telephone Encounter (Signed)
Called and spoke with pt and he is aware of RA recs to stop the coumadin 4 days prior to the procedure on July 3.  Nothing further is needed.

## 2017-11-03 NOTE — Telephone Encounter (Signed)
The Coumadin is for atrial fibrillation. Can stop 4 days prior to procedure

## 2017-11-04 ENCOUNTER — Telehealth: Payer: Self-pay | Admitting: Pulmonary Disease

## 2017-11-04 NOTE — Telephone Encounter (Signed)
Rigoberto Noel, MD  to [facility] Triage Pool       11/03/17 3:31 PM  Note    The Coumadin is for atrial fibrillation. Can stop 4 days prior to procedure            Pt states he has already gotten the Coumadin concerns worked out and no further questions or concerns at this time.

## 2017-11-05 LAB — CUP PACEART REMOTE DEVICE CHECK
Date Time Interrogation Session: 20190629225515
Implantable Lead Implant Date: 20170612
Implantable Lead Location: 753859
Implantable Lead Model: 7741
Implantable Lead Serial Number: 760165
MDC IDC LEAD IMPLANT DT: 20170612
MDC IDC LEAD LOCATION: 753860
MDC IDC LEAD SERIAL: 733074
MDC IDC PG IMPLANT DT: 20170612
Pulse Gen Serial Number: 724121

## 2017-11-08 ENCOUNTER — Telehealth: Payer: Self-pay | Admitting: Pulmonary Disease

## 2017-11-08 ENCOUNTER — Ambulatory Visit (HOSPITAL_COMMUNITY): Payer: Medicare Other

## 2017-11-08 NOTE — Telephone Encounter (Signed)
Megan want's to give you a different  number her other number is not working  [phone]

## 2017-11-08 NOTE — Telephone Encounter (Signed)
Called and spoke with Christian Rasmussen with [facility] at phone [phone] She advised that she has already spoken with BQ regarding pt's labs for Thoroesthesias tomorrow. She states that everything is correct, and nothing further needed at this time.

## 2017-11-09 ENCOUNTER — Ambulatory Visit (HOSPITAL_COMMUNITY)
Admission: RE | Admit: 2017-11-09 | Discharge: 2017-11-09 | Disposition: A | Discharge status: Home / Self Care | Payer: Medicare Other | Source: Ambulatory Visit | Attending: Diagnostic Radiology | Admitting: Diagnostic Radiology

## 2017-11-09 ENCOUNTER — Ambulatory Visit (HOSPITAL_COMMUNITY): Payer: Medicare Other

## 2017-11-09 ENCOUNTER — Ambulatory Visit (HOSPITAL_COMMUNITY)
Admission: RE | Admit: 2017-11-09 | Discharge: 2017-11-09 | Disposition: A | Discharge status: Home / Self Care | Payer: Medicare Other | Source: Ambulatory Visit | Attending: Pulmonary Disease | Admitting: Pulmonary Disease

## 2017-11-09 DIAGNOSIS — Z9889 Other specified postprocedural states: Secondary | ICD-10-CM | POA: Insufficient documentation

## 2017-11-09 DIAGNOSIS — R846 Abnormal cytological findings in specimens from respiratory organs and thorax: Secondary | ICD-10-CM | POA: Diagnosis not present

## 2017-11-09 DIAGNOSIS — J90 Pleural effusion, not elsewhere classified: Secondary | ICD-10-CM | POA: Diagnosis not present

## 2017-11-09 DIAGNOSIS — I700 Atherosclerosis of aorta: Secondary | ICD-10-CM | POA: Insufficient documentation

## 2017-11-09 LAB — BODY FLUID CELL COUNT WITH DIFFERENTIAL
EOS FL: 0 %
Lymphs, Fluid: 57 %
MONOCYTE-MACROPHAGE-SEROUS FLUID: 37 % — AB (ref 50–90)
NEUTROPHIL FLUID: 6 % (ref 0–25)
OTHER CELLS FL: REACTIVE %
Total Nucleated Cell Count, Fluid: 673 cu mm (ref 0–1000)

## 2017-11-09 LAB — GRAM STAIN: GRAM STAIN: NONE SEEN

## 2017-11-09 LAB — LACTATE DEHYDROGENASE, PLEURAL OR PERITONEAL FLUID: LD FL: 107 U/L — AB (ref 3–23)

## 2017-11-09 LAB — GLUCOSE, PLEURAL OR PERITONEAL FLUID: Glucose, Fluid: 117 mg/dL

## 2017-11-09 LAB — ALBUMIN, PLEURAL OR PERITONEAL FLUID: ALBUMIN FL: 2.3 g/dL

## 2017-11-09 LAB — PROTEIN, PLEURAL OR PERITONEAL FLUID: Total protein, fluid: 3.5 g/dL

## 2017-11-09 NOTE — Sedation Documentation (Signed)
Patient denies pain and is resting comfortably.  

## 2017-11-09 NOTE — Sedation Documentation (Signed)
ED Provider at bedside. 

## 2017-11-09 NOTE — Procedures (Signed)
PreOperative Dx: LEFT pleural effusion Postoperative Dx: LEFT pleural effusion Procedure:   US guided LEFT thoracentesis Radiologist:  Thornton Papas Anesthesia:  10 ml of 1% lidocaine Specimen:  500 mL of amber colored fluid EBL:   < 1 ml Complications: None

## 2017-11-09 NOTE — Sedation Documentation (Signed)
Vital signs stable. 

## 2017-11-14 LAB — CULTURE, BODY FLUID W GRAM STAIN -BOTTLE

## 2017-11-14 LAB — CULTURE, BODY FLUID-BOTTLE: CULTURE: NO GROWTH

## 2017-11-18 DIAGNOSIS — G4733 Obstructive sleep apnea (adult) (pediatric): Secondary | ICD-10-CM | POA: Diagnosis not present

## 2017-11-19 DIAGNOSIS — G4733 Obstructive sleep apnea (adult) (pediatric): Secondary | ICD-10-CM | POA: Diagnosis not present

## 2017-11-21 ENCOUNTER — Telehealth: Payer: Self-pay | Admitting: Pulmonary Disease

## 2017-11-21 NOTE — Telephone Encounter (Signed)
patient stop by clinic to drop off home sleep study; have appt with NP Warner Mccreedy tomorrow (7/16) for f/u on thoroesthesias; pt states if he could get results and talk with provider over phone the appointment will not be needed; appointment still booked; can cancel if not needed; patient contact # [phone]

## 2017-11-21 NOTE — Telephone Encounter (Signed)
Sleep study awaiting to be read. MD Young has sleep study will try to get it read today. Will call patient this afternoon if results available.

## 2017-11-21 NOTE — Telephone Encounter (Signed)
I do not know the patient.  Patient was intended to be following up with me in 2 weeks.  Looks like patient is Christian Rasmussen received his thoracentesis results.  Not showing any results from his home sleep study.  I would assume that we are following up with the patient based off of making sure the pleural effusions have not returned.  I will have [place] follow-up with Burman Nieves and Dr. Lake Bells regarding this patient.  Wyn Quaker FNP

## 2017-11-21 NOTE — Telephone Encounter (Signed)
Patient calling back - advised per Corrine to keep appt tomorrow, pt would like to speak to nurse regarding this - he can be reached at [phone] -pr

## 2017-11-21 NOTE — Telephone Encounter (Signed)
Called and spoke with patient, advised patient that I would be sending this information over to [place]. Patient states that if he could get results over the phone from this instead of coming in to be seen that would be better for him. I advised patient that once we got a response from [place] we would give him a call.    Aaron Edelman please advise, thank you.

## 2017-11-21 NOTE — Progress Notes (Signed)
@Patient  ID: Christian Rasmussen, male    DOB: 04-24-1941, 77 y.o.   MRN: [id number]  Chief Complaint  Patient presents with  . Follow-up    States his breathing has been better. Needs sleep study results and plueral effusion f/u.     Referring provider: Monico Blitz, MD  HPI: Synopsis: Referred in 07/2017 for dyspnea, he has a past medical history significant for systolic heart failure, coronary artery disease and mitral valve repair in 2017 and pacer placed.  He also has atrial fibrillation.  He smoked cigarettes until age 31.  He was found on a high-resolution CT scan in 2019 to have mild centrilobular emphysema and a left-sided pleural effusion which seems to have been present since his coronary artery bypass surgery in 2017. Patient of Dr. Lake Bells  Recent [facility] Encounters:   Chief Complaint  Patient presents with  . Follow-up    ROV, continues to have shortness of breath, plan for thoracentisis    Christian Rasmussen is here for follow-up with his shortness of breath.  Since the last visit he was found to have a left-sided pleural effusion.  He has also been seen by cardiology and had a TEE in May 2019 which showed moderate stenosis, LVEF 50 to 55%.  He says that he is not exercising routinely.  He does not have problems with cough.  He still feels short of breath when he climbs a hill. His wife says that he snores some, she has not noticed him having periods of apnea.  He does nap "all day long". Plan: Home sleep study, proceed forward with left thoracentesis  11/21/17 - telephone encounter -patient wanting results of home sleep study and wanting to cancel appointment on 11/22/2017.  Patient encouraged to keep appointment as we are addressing him sleep study as well as symptoms status post thoracentesis which was completed on 11/09/2017  Tests:  11/09/2017- thoracentesis-left pleural effusion- 500 cc of left pleural fluid >>>LD - 107  08/23/2017-pulmonary function test - restrictive and  obstructive lung disease with reduced DLCO, concavity and flow volume loops  Imaging:  11/09/2017-chest x-ray- no pneumothorax, small pleural effusion remains on left 08/18/2017-CT chest high-res- no evidence of ILD, scattered small solid pulmonary nodules in right lung largest 5 mm, can repeat with noncontrast chest CT in 12 months   Cardiac:  09/16/2017-echocardiogram-echo TEE- LV ejection fraction 50 to 55% PA peak pressure 49, pulmonary artery systolic pressure is moderately increased consistent with moderate pulmonary hypertension  Labs:   Micro:   Chart Review:    11/22/17 OV 77 year old patient presenting to office today for follow-up of home sleep study test results. Home sleep study performed on 11/18/2017 revealing AHI of 23.2.  SaO2 low of 79%.  Average SaO2 is 88%.  Reviewer suggests Pt could benefit from CPAP/BiPAP titration sleep study.    Patient does not believe that he has obstructive sleep apnea and is very apprehensive to start any sort of therapy for this.  Patient does not believe that people die from sleep apnea, patient is concerned that by treating for sleep apnea that this would not be doing any good.  Patient is also apprehensive with starting CPAP that he does not want to have to continue doing daily.  We will discuss today at today's office visit.  Patient denies any other respiratory changes at this time, dyspnea is continues to be improved after thoracentesis.  Patient denies hemoptysis or fevers.    No Known Allergies  Immunization History  Administered  Date(s) Administered  . Influenza,inj,quad, With Preservative 02/12/2017  . Pneumococcal Conjugate-13 05/10/2016  . Zoster Recombinat (Shingrix) 03/10/2017, 06/16/2017    Past Medical History:  Diagnosis Date  . 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 Aortic stenosis 10/14/2015  . CAD (coronary artery disease)    status post Taxus stent patency of RCA 2004, Cardiolite negative for  ischemia in 2010.  . Diabetes mellitus ([place])   . Dyslipidemia   . History of kidney stones   . Hypertension   . Mitral regurgitation 10/15/2015   Moderate-severe by intra-operative TEE  . Overweight   . Right bundle branch block (RBBB) with left anterior hemiblock   . 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 Snores   . Typical atrial flutter [facility])    s/p ablation 11-23-2012 by Dr Rayann Heman    Tobacco History: Social History   Tobacco Use  Smoking Status Former Smoker  . Packs/day: 1.00  . Years: 28.00  . Pack years: 28.00  . Types: Cigarettes  . Start date: 10/21/1956  . Last attempt to quit: 05/10/1985  . Years since quitting: 32.5  Smokeless Tobacco Never Used   Counseling given: Not Answered Continue not smoking  Outpatient Encounter Medications as of 11/22/2017  Medication Sig  . amLODipine (NORVASC) 10 MG tablet Take 1 tablet (10 mg total) by mouth daily.  Marland Kitchen aspirin EC 81 MG tablet Take 1 tablet (81 mg total) by mouth daily.  . ASTAXANTHIN PO Take 10 mg by mouth daily.   Marland Kitchen atorvastatin (LIPITOR) 40 MG tablet Take 40 mg by mouth daily.   . carvedilol (COREG) 6.25 MG tablet TAKE 2 TABLETS TWICE DAILY  . Coenzyme Q10 100 MG capsule Take 100 mg by mouth 3 (three) times daily.  . furosemide (LASIX) 40 MG tablet Take 1 tablet (40 mg total) by mouth daily.  Marland Kitchen KLOR-CON M10 10 MEQ tablet TAKE 1 TABLET BY MOUTH ONCE DAILY  . lisinopril (PRINIVIL,ZESTRIL) 20 MG tablet Take 1 tablet (20 mg total) by mouth daily.  . metFORMIN (GLUCOPHAGE) 500 MG tablet Take 1,000 mg by mouth 2 (two) times daily with a meal.   . warfarin (COUMADIN) 5 MG tablet Take 1 1/2 tablets daily except  1 tablet on Mondays and Fridays   No facility-administered encounter medications on file as of 11/22/2017.      Review of Systems  Review of Systems  Constitutional: Positive for fatigue. Negative for activity change, chills, fever and unexpected weight change.  HENT: Negative for postnasal drip, rhinorrhea, sinus pressure, sinus pain, sneezing and sore throat.   Respiratory: Negative for cough, shortness of breath and wheezing.   Cardiovascular: Negative for chest pain and palpitations.  Gastrointestinal: Negative for constipation, diarrhea, nausea and vomiting.  Genitourinary: Negative for hematuria and urgency.  Musculoskeletal: Negative for arthralgias.  Skin: Negative for color change.  Neurological: Negative for dizziness, seizures and headaches.  Psychiatric/Behavioral: Positive for sleep disturbance (Increased daytime fatigue/ tired most days, doesnt feel rested ). Negative for dysphoric mood. The patient is not nervous/anxious.   All other systems reviewed and are negative.     Physical Exam  BP 110/64   Pulse 84   Ht 5'  11" (1.803 m)   Wt 224 lb 9.6 oz (101.9 kg)   SpO2 91%   BMI 31.33 kg/m   Wt Readings from Last 5 Encounters:  11/22/17 224 lb 9.6 oz (101.9 kg)  11/02/17 222 lb (100.7 kg)  09/16/17 220 lb (99.8 kg)  09/12/17 220 lb (99.8 kg)  08/23/17 221 lb (100.2 kg)     Physical Exam  Constitutional: He is oriented to person, place, and time and well-developed, well-nourished, and in no distress. No distress.  HENT:  Head: Normocephalic and atraumatic.  Right Ear: Hearing, tympanic membrane, external ear and ear canal normal.  Left Ear: Hearing, tympanic membrane, external ear and ear canal normal.  Nose: Nose normal. Right sinus exhibits no maxillary sinus tenderness and no frontal sinus tenderness. Left sinus exhibits no maxillary sinus tenderness and no frontal sinus tenderness.  Mouth/Throat: Uvula is midline and oropharynx is clear and moist. No  oropharyngeal exudate.  Mallampati 3  Eyes: Pupils are equal, round, and reactive to light.  Neck: Normal range of motion. Neck supple. No JVD present.  Cardiovascular: Normal rate, regular rhythm and normal heart sounds.  Pulmonary/Chest: Effort normal and breath sounds normal. No accessory muscle usage. No respiratory distress. He has no decreased breath sounds. He has no wheezes. He has no rhonchi.  Air movement in all lobes  Abdominal: Soft. Bowel sounds are normal. There is no tenderness.  Musculoskeletal: Normal range of motion. He exhibits no edema.  Lymphadenopathy:    He has no cervical adenopathy.  Neurological: He is alert and oriented to person, place, and time. Gait normal.  Skin: Skin is warm and dry. He is not diaphoretic. No erythema.  Psychiatric: Mood, memory, affect and judgment normal.  Nursing note and vitals reviewed.     Lab Results:  CBC    Component Value Date/Time   WBC 12.9 (H) 09/12/2017 0958   WBC 11.9 (H) 08/04/2017 1118   RBC 4.40 09/12/2017 0958   RBC 4.24 08/04/2017 1118   HGB 12.9 (L) 09/12/2017 0958   HCT 38.2 09/12/2017 0958   PLT 323 09/12/2017 0958   MCV 87 09/12/2017 0958   MCH 29.3 09/12/2017 0958   MCH 29.5 04/28/2016 2158   MCHC 33.8 09/12/2017 0958   MCHC 32.8 08/04/2017 1118   RDW 16.1 (H) 09/12/2017 0958   LYMPHSABS 1.0 08/04/2017 1118   MONOABS 0.9 08/04/2017 1118   EOSABS 0.1 08/04/2017 1118   BASOSABS 0.1 08/04/2017 1118    BMET    Component Value Date/Time   NA 142 09/12/2017 0958   K 4.8 09/12/2017 0958   CL 102 09/12/2017 0958   CO2 22 09/12/2017 0958   GLUCOSE 88 09/12/2017 0958   GLUCOSE 147 (H) 01/28/2017 0757   BUN 21 09/12/2017 0958   CREATININE 1.05 09/12/2017 0958   CALCIUM 10.1 09/12/2017 0958   GFRNONAA 70 09/12/2017 0958   GFRAA 80 09/12/2017 0958    BNP    Component Value Date/Time   BNP 271.0 (H) 01/25/2017 1415    ProBNP No results found for: PROBNP  Imaging: Dg Chest 1 View  Result  Date: 11/09/2017 CLINICAL DATA:  Status post thoracentesis EXAM: CHEST  1 VIEW COMPARISON:  Chest radiograph April 01, 2017 and chest CT August 18, 2017 FINDINGS: No pneumothorax. There is a small left pleural effusion. There is no edema or consolidation. Heart is upper normal in size with pulmonary vascularity normal. Pacemaker leads are attached the right atrium and right ventricle. Patient is status post coronary  artery bypass grafting. There is aortic atherosclerosis. No bone lesions. IMPRESSION: No pneumothorax. Small pleural effusion remains on the left. No edema or consolidation. Stable cardiac silhouette. Pacemaker leads attached to right atrium and right ventricle. There is aortic atherosclerosis. Aortic Atherosclerosis (ICD10-I70.0). Electronically Signed   By: Lowella Grip III M.D.   On: 11/09/2017 13:12   US Thoracentesis Asp Pleural Space W/img Guide  Result Date: 11/09/2017 INDICATION: LEFT pleural effusion of uncertain etiology, shortness of breath EXAM: ULTRASOUND GUIDED DIAGNOSTIC LEFT THORACENTESIS MEDICATIONS: None. COMPLICATIONS: None immediate. PROCEDURE: Procedure, benefits, and risks of procedure were discussed with patient. Written informed consent for procedure was obtained. Time out protocol followed. Pleural effusion localized by ultrasound at the posterior LEFT hemithorax. Skin prepped and draped in usual sterile fashion. Skin and soft tissues anesthetized with 10 ML of 1% lidocaine. 8 French thoracentesis catheter placed into the LEFT pleural space. Requested 500 mL of LEFT pleural fluid aspirated by a combination of vacuum bottle and syringe pump. Procedure tolerated well by patient without immediate complication. FINDINGS: A total of approximately 500 mL of LEFT pleural fluid was removed. Samples were sent to the laboratory as requested by the clinical team. IMPRESSION: Successful ultrasound guided LEFT thoracentesis yielding 500 mL of pleural fluid. Electronically Signed    By: Lavonia Dana M.D.   On: 11/09/2017 13:26     Assessment & Plan:   Pleasant 77 year old patient seen office today.  Patient with recently diagnosed obstructive sleep apnea on home sleep study.  Will proceed forward with ordering CPAP today.  Will do auto settings.  Patient knows the need to follow-up in 2 months to show 30-day compliance report.  If patient has any difficulties obtaining the device or using his CPAP he needs to follow-up with our office.  Discussed extensively with patient obstructive sleep apnea, treatment options, as well as implications if he does not proceed forward with treatment.  Answered all questions for patient and spouse.  They agree to proceed forward with CPAP therapy.   OSA (obstructive sleep apnea) We will proceed forward with CPAP Patient would like to establish with [facility] Patient had a mask fitting with [facility] Patient will need 31-month follow-up with our office to show 30-day CPAP compliance Discussed extensively with patient the need to use CPAP daily, for greater than 4 hours, and anytime he is sleeping.  Also discussed extensively why we treat for obstructive sleep apnea.  Pleural effusion, bilateral Stable at this time.  I do not believe we need chest x-ray.   This appointment was 45 minutes long with over 50% of that time being direct face-to-face patient care, assessment, plan of care discussion, follow-up.  Lauraine Rinne, NP 11/22/2017

## 2017-11-21 NOTE — Telephone Encounter (Signed)
Called and spoke with pt stating to him the reason for pt to keep his appt tomorrow, 7/16 with Wyn Quaker, NP  Pt expressed understanding and stated to me he would be there for the appt.  Nothing further needed.

## 2017-11-21 NOTE — Telephone Encounter (Signed)
Called patient, unable to reach. Left detailed message advising patient to keep appointment for tomorrow. Advised patient to please call us back.

## 2017-11-22 ENCOUNTER — Ambulatory Visit (INDEPENDENT_AMBULATORY_CARE_PROVIDER_SITE_OTHER): Payer: Medicare Other | Admitting: *Deleted

## 2017-11-22 ENCOUNTER — Ambulatory Visit (INDEPENDENT_AMBULATORY_CARE_PROVIDER_SITE_OTHER): Payer: Medicare Other | Admitting: Pulmonary Disease

## 2017-11-22 ENCOUNTER — Encounter: Payer: Self-pay | Admitting: Pulmonary Disease

## 2017-11-22 VITALS — BP 110/64 | HR 84 | Ht 71.0 in | Wt 224.6 lb

## 2017-11-22 DIAGNOSIS — G4733 Obstructive sleep apnea (adult) (pediatric): Secondary | ICD-10-CM | POA: Diagnosis not present

## 2017-11-22 DIAGNOSIS — J90 Pleural effusion, not elsewhere classified: Secondary | ICD-10-CM | POA: Diagnosis not present

## 2017-11-22 DIAGNOSIS — Z5181 Encounter for therapeutic drug level monitoring: Secondary | ICD-10-CM

## 2017-11-22 DIAGNOSIS — Z9889 Other specified postprocedural states: Secondary | ICD-10-CM

## 2017-11-22 DIAGNOSIS — I4892 Unspecified atrial flutter: Secondary | ICD-10-CM

## 2017-11-22 LAB — POCT INR: INR: 2.4 (ref 2.0–3.0)

## 2017-11-22 NOTE — Progress Notes (Signed)
Reviewed, agree 

## 2017-11-22 NOTE — Patient Instructions (Addendum)
We will start you on CPAP [facility] will reach out to you and they will bring you in for an appointment as well as to do a mask fitting Please contact our office if you are having difficulty using the CPAP or obtaining it Follow-up with our office in 2 months for 30-day compliance report and CPAP start  I do not believe you need a chest x-ray today  Continue to monitor respiratory symptoms if they worsen, start coughing up blood, have any concerns regarding progressive shortness of breath please contact our office.  Please contact the office if your symptoms worsen or you have concerns that you are not improving.   Thank you for choosing [facility] for your healthcare, and for allowing [place] to partner with you on your healthcare journey. I am thankful to be able to provide care to you today.   Wyn Quaker FNP-C            CPAP and BiPAP Information CPAP and BiPAP are methods of helping a person breathe with the use of air pressure. CPAP stands for "continuous positive airway pressure." BiPAP stands for "bi-level positive airway pressure." In both methods, air is blown through your nose or mouth and into your air passages to help you breathe well. CPAP and BiPAP use different amounts of pressure to blow air. With CPAP, the amount of pressure stays the same while you breathe in and out. With BiPAP, the amount of pressure is increased when you breathe in (inhale) so that you can take larger breaths. Your health care provider will recommend whether CPAP or BiPAP would be more helpful for you. Why are CPAP and BiPAP treatments used? CPAP or BiPAP can be helpful if you have:  Sleep apnea.  Chronic obstructive pulmonary disease (COPD).  Heart failure.  Medical conditions that weaken the muscles of the chest including muscular dystrophy, or neurological diseases such as amyotrophic lateral sclerosis (ALS).  Other problems that cause breathing to be weak, abnormal, or  difficult.  CPAP is most commonly used for obstructive sleep apnea (OSA) to keep the airways from collapsing when the muscles relax during sleep. How is CPAP or BiPAP administered? Both CPAP and BiPAP are provided by a small machine with a flexible plastic tube that attaches to a plastic mask. You wear the mask. Air is blown through the mask into your nose or mouth. The amount of pressure that is used to blow the air can be adjusted on the machine. Your health care provider will determine the pressure setting that should be used based on your individual needs. When should CPAP or BiPAP be used? In most cases, the mask only needs to be worn during sleep. Generally, the mask needs to be worn throughout the night and during any daytime naps. People with certain medical conditions may also need to wear the mask at other times when they are awake. Follow instructions from your health care provider about when to use the machine. What are some tips for using the mask?  Because the mask needs to be snug, some people feel trapped or closed-in (claustrophobic) when first using the mask. If you feel this way, you may need to get used to the mask. One way to do this is by holding the mask loosely over your nose or mouth and then gradually applying the mask more snugly. You can also gradually increase the amount of time that you use the mask.  Masks are available in various types and sizes.  Some fit over your mouth and nose while others fit over just your nose. If your mask does not fit well, talk with your health care provider about getting a different one.  If you are using a mask that fits over your nose and you tend to breathe through your mouth, a chin strap may be applied to help keep your mouth closed.  The CPAP and BiPAP machines have alarms that may sound if the mask comes off or develops a leak.  If you have trouble with the mask, it is very important that you talk with your health care provider about  finding a way to make the mask easier to tolerate. Do not stop using the mask. Stopping the use of the mask could have a negative impact on your health. What are some tips for using the machine?  Place your CPAP or BiPAP machine on a secure table or stand near an electrical outlet.  Know where the on/off switch is located on the machine.  Follow instructions from your health care provider about how to set the pressure on your machine and when you should use it.  Do not eat or drink while the CPAP or BiPAP machine is on. Food or fluids could get pushed into your lungs by the pressure of the CPAP or BiPAP.  Do not smoke. Tobacco smoke residue can damage the machine.  For home use, CPAP and BiPAP machines can be rented or purchased through home health care companies. Many different brands of machines are available. Renting a machine before purchasing may help you find out which particular machine works well for you.  Keep the CPAP or BiPAP machine and attachments clean. Ask your health care provider for specific instructions. Get help right away if:  You have redness or open areas around your nose or mouth where the mask fits.  You have trouble using the CPAP or BiPAP machine.  You cannot tolerate wearing the CPAP or BiPAP mask.  You have pain, discomfort, and bloating in your abdomen. Summary  CPAP and BiPAP are methods of helping a person breathe with the use of air pressure.  Both CPAP and BiPAP are provided by a small machine with a flexible plastic tube that attaches to a plastic mask.  If you have trouble with the mask, it is very important that you talk with your health care provider about finding a way to make the mask easier to tolerate. This information is not intended to replace advice given to you by your health care provider. Make sure you discuss any questions you have with your health care provider. Document Released: 01/23/2004 Document Revised: 03/15/2016 Document  Reviewed: 03/15/2016 Elsevier Interactive Patient Education  2017 [employer].

## 2017-11-22 NOTE — Patient Instructions (Signed)
Description   Continue coumadin 1 1/2 tablets daily except 1 tablet on Mondays and Fridays Recheck in 2 weeks

## 2017-11-22 NOTE — Assessment & Plan Note (Signed)
We will proceed forward with CPAP Patient would like to establish with [facility] Patient had a mask fitting with [facility] Patient will need 32-month follow-up with our office to show 30-day CPAP compliance Discussed extensively with patient the need to use CPAP daily, for greater than 4 hours, and anytime he is sleeping.  Also discussed extensively why we treat for obstructive sleep apnea.

## 2017-11-22 NOTE — Assessment & Plan Note (Signed)
Stable at this time.  I do not believe we need chest x-ray.

## 2017-11-23 DIAGNOSIS — D1801 Hemangioma of skin and subcutaneous tissue: Secondary | ICD-10-CM | POA: Diagnosis not present

## 2017-11-23 DIAGNOSIS — L820 Inflamed seborrheic keratosis: Secondary | ICD-10-CM | POA: Diagnosis not present

## 2017-11-23 DIAGNOSIS — D225 Melanocytic nevi of trunk: Secondary | ICD-10-CM | POA: Diagnosis not present

## 2017-11-23 DIAGNOSIS — L570 Actinic keratosis: Secondary | ICD-10-CM | POA: Diagnosis not present

## 2017-11-23 DIAGNOSIS — L814 Other melanin hyperpigmentation: Secondary | ICD-10-CM | POA: Diagnosis not present

## 2017-11-23 DIAGNOSIS — L821 Other seborrheic keratosis: Secondary | ICD-10-CM | POA: Diagnosis not present

## 2017-11-24 ENCOUNTER — Other Ambulatory Visit: Payer: Self-pay | Admitting: *Deleted

## 2017-11-24 DIAGNOSIS — R400 Somnolence: Secondary | ICD-10-CM

## 2017-12-05 DIAGNOSIS — Z299 Encounter for prophylactic measures, unspecified: Secondary | ICD-10-CM | POA: Diagnosis not present

## 2017-12-05 DIAGNOSIS — I509 Heart failure, unspecified: Secondary | ICD-10-CM | POA: Diagnosis not present

## 2017-12-05 DIAGNOSIS — J90 Pleural effusion, not elsewhere classified: Secondary | ICD-10-CM | POA: Diagnosis not present

## 2017-12-05 DIAGNOSIS — I471 Supraventricular tachycardia: Secondary | ICD-10-CM | POA: Diagnosis not present

## 2017-12-05 DIAGNOSIS — I4892 Unspecified atrial flutter: Secondary | ICD-10-CM | POA: Diagnosis not present

## 2017-12-05 DIAGNOSIS — E1151 Type 2 diabetes mellitus with diabetic peripheral angiopathy without gangrene: Secondary | ICD-10-CM | POA: Diagnosis not present

## 2017-12-05 DIAGNOSIS — E1165 Type 2 diabetes mellitus with hyperglycemia: Secondary | ICD-10-CM | POA: Diagnosis not present

## 2017-12-05 DIAGNOSIS — R600 Localized edema: Secondary | ICD-10-CM | POA: Diagnosis not present

## 2017-12-05 DIAGNOSIS — Z6832 Body mass index (BMI) 32.0-32.9, adult: Secondary | ICD-10-CM | POA: Diagnosis not present

## 2017-12-05 DIAGNOSIS — I10 Essential (primary) hypertension: Secondary | ICD-10-CM | POA: Diagnosis not present

## 2017-12-14 ENCOUNTER — Telehealth: Payer: Self-pay | Admitting: *Deleted

## 2017-12-14 NOTE — Telephone Encounter (Signed)
Patient states that he needs appointment for tomorrow and was transferred here stating you had a full schedule. / tg

## 2017-12-14 NOTE — Telephone Encounter (Signed)
Spoke with pt.  He missed last appt and needs to reschedule.  Not having any problems.  Appt made for 12/15/17 at 9am.  Pt verbalized agreement.

## 2017-12-15 ENCOUNTER — Ambulatory Visit (INDEPENDENT_AMBULATORY_CARE_PROVIDER_SITE_OTHER): Payer: Medicare Other | Admitting: *Deleted

## 2017-12-15 DIAGNOSIS — I4891 Unspecified atrial fibrillation: Secondary | ICD-10-CM | POA: Diagnosis not present

## 2017-12-15 DIAGNOSIS — I4892 Unspecified atrial flutter: Secondary | ICD-10-CM

## 2017-12-15 DIAGNOSIS — Z5181 Encounter for therapeutic drug level monitoring: Secondary | ICD-10-CM

## 2017-12-15 LAB — POCT INR: INR: 2.7 (ref 2.0–3.0)

## 2017-12-15 NOTE — Patient Instructions (Signed)
Continue coumadin 1 1/2 tablets daily except 1 tablet on Mondays and Fridays Recheck in 4 weeks

## 2017-12-28 ENCOUNTER — Ambulatory Visit: Payer: Medicare Other | Admitting: Pulmonary Disease

## 2018-01-12 ENCOUNTER — Ambulatory Visit (INDEPENDENT_AMBULATORY_CARE_PROVIDER_SITE_OTHER): Payer: Medicare Other | Admitting: *Deleted

## 2018-01-12 DIAGNOSIS — I4891 Unspecified atrial fibrillation: Secondary | ICD-10-CM

## 2018-01-12 DIAGNOSIS — Z5181 Encounter for therapeutic drug level monitoring: Secondary | ICD-10-CM

## 2018-01-12 LAB — POCT INR: INR: 3.9 — AB (ref 2.0–3.0)

## 2018-01-12 NOTE — Patient Instructions (Signed)
Hold coumadin tonight then decrease dose to 1 1/2 tablets daily except 1 tablet on Mondays, Wednesdays and Fridays Recheck in 3 weeks 

## 2018-01-18 ENCOUNTER — Encounter: Payer: Self-pay | Admitting: Nurse Practitioner

## 2018-01-18 ENCOUNTER — Ambulatory Visit (INDEPENDENT_AMBULATORY_CARE_PROVIDER_SITE_OTHER)
Admission: RE | Admit: 2018-01-18 | Discharge: 2018-01-18 | Disposition: A | Discharge status: Home / Self Care | Payer: Medicare Other | Source: Ambulatory Visit | Attending: Nurse Practitioner | Admitting: Nurse Practitioner

## 2018-01-18 ENCOUNTER — Ambulatory Visit (INDEPENDENT_AMBULATORY_CARE_PROVIDER_SITE_OTHER): Payer: Medicare Other | Admitting: Nurse Practitioner

## 2018-01-18 ENCOUNTER — Other Ambulatory Visit (INDEPENDENT_AMBULATORY_CARE_PROVIDER_SITE_OTHER): Payer: Medicare Other

## 2018-01-18 VITALS — BP 128/70 | HR 84 | Ht 71.0 in | Wt 221.2 lb

## 2018-01-18 DIAGNOSIS — R0602 Shortness of breath: Secondary | ICD-10-CM

## 2018-01-18 LAB — CBC WITH DIFFERENTIAL/PLATELET
Basophils Absolute: 0.2 10*3/uL — ABNORMAL HIGH (ref 0.0–0.1)
Basophils Relative: 1.4 % (ref 0.0–3.0)
EOS ABS: 0.2 10*3/uL (ref 0.0–0.7)
EOS PCT: 1.6 % (ref 0.0–5.0)
HCT: 36.7 % — ABNORMAL LOW (ref 39.0–52.0)
HEMOGLOBIN: 12.2 g/dL — AB (ref 13.0–17.0)
LYMPHS ABS: 0.8 10*3/uL (ref 0.7–4.0)
Lymphocytes Relative: 6.9 % — ABNORMAL LOW (ref 12.0–46.0)
MCHC: 33.4 g/dL (ref 30.0–36.0)
MCV: 86.1 fl (ref 78.0–100.0)
Monocytes Absolute: 0.9 10*3/uL (ref 0.1–1.0)
Monocytes Relative: 7.4 % (ref 3.0–12.0)
NEUTROS PCT: 82.7 % — AB (ref 43.0–77.0)
Neutro Abs: 9.8 10*3/uL — ABNORMAL HIGH (ref 1.4–7.7)
Platelets: 398 10*3/uL (ref 150.0–400.0)
RBC: 4.26 Mil/uL (ref 4.22–5.81)
RDW: 17.2 % — ABNORMAL HIGH (ref 11.5–15.5)
WBC: 11.8 10*3/uL — AB (ref 4.0–10.5)

## 2018-01-18 LAB — BASIC METABOLIC PANEL
BUN: 25 mg/dL — AB (ref 6–23)
CALCIUM: 9.9 mg/dL (ref 8.4–10.5)
CO2: 26 mEq/L (ref 19–32)
Chloride: 103 mEq/L (ref 96–112)
Creatinine, Ser: 1.02 mg/dL (ref 0.40–1.50)
GFR: 75.62 mL/min (ref >60.00)
GLUCOSE: 91 mg/dL (ref 70–99)
POTASSIUM: 4.3 meq/L (ref 3.5–5.1)
Sodium: 139 mEq/L (ref 135–145)

## 2018-01-18 NOTE — Progress Notes (Signed)
@Patient  ID: Christian Rasmussen, male    DOB: [birth year], 77 y.o.   MRN: [id number]  Chief Complaint  Patient presents with  . Shortness of Breath    with some dry cough    Referring provider: Monico Blitz, MD   HPI: 77 year old male former smoker with emphysema followed by Dr. Lake Bells.  Past health history includes systolic heart failure, coronary artery disease and mitral valve repair in 2017 and pacer placed.  He also has atrial fibrillation. He also has recent history of left sided pleural effusion with thoracentesis.   Tests: Chest imaging: Feb 2019 chest imaging shows a pacemaker, cardiomegaly, chronic appearing left-sided pleural effusion April 2019 high-resolution CT chest images independently reviewed showing no evidence of interstitial lung disease, some mild centrilobular emphysema, small pleural effusion noted on the left  PFT: June 2017 PFT: Ratio 76%, FEV1 2.2 L 66% predicted, FVC 2.83 L 63% predicted, total lung capacity 5.4 L 74% predicted, DLCO 21.1 mL 62% predicted  Echo: Sep 16, 2017 trans-esophageal echocardiogram showed an LVEF of 50 to 55%, PA pressure estimate of 49 mmHg, moderate aortic regurgitation and moderate mitral stenosis   Heart Catheterization:  Cardiology records from February 2019 reviewed where he was seen for coronary artery disease, chronic systolic heart failure, and a history of mitral valve repair.  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  Dr. Lovena Le placed a pacemaker.  He has atrial fibrillation.  Shortness of Breath  This is a chronic problem. The current episode started more than 1 year ago. The problem has been gradually worsening. Pertinent negatives include no fever, hemoptysis, leg pain, leg swelling or wheezing. The symptoms are aggravated by any activity. He has tried rest for the symptoms. (Emphysema found on CT)     No Known Allergies  Immunization History  Administered Date(s) Administered  .  Influenza,inj,quad, With Preservative 02/12/2017  . Pneumococcal Conjugate-13 05/10/2016  . Zoster Recombinat (Shingrix) 03/10/2017, 06/16/2017    Past Medical History:  Diagnosis Date  . 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 Aortic stenosis 10/14/2015  . CAD (coronary artery disease)    status post Taxus stent patency of RCA 2004, Cardiolite negative for ischemia in 2010.  . Diabetes mellitus ([place])   . Dyslipidemia   . History of kidney stones   . Hypertension   . Mitral regurgitation 10/15/2015   Moderate-severe by intra-operative TEE  . Overweight   . Right bundle branch block (RBBB) with left anterior hemiblock   . 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 Snores   . Typical atrial flutter [facility])    s/p ablation 11-23-2012 by Dr Rayann Heman    Tobacco History: Social History   Tobacco Use  Smoking Status Former Smoker  . Packs/day: 1.00  . Years: 28.00  . Pack years: 28.00  . Types: Cigarettes  . Start date: 10/21/1956  . Last attempt to quit: 05/10/1985  . Years since quitting: 32.7  Smokeless Tobacco Never Used   Counseling given: Yes   Outpatient Encounter Medications as of 01/18/2018  Medication Sig  . amLODipine (NORVASC) 10 MG tablet Take 1 tablet (10 mg total) by  mouth daily.  Marland Kitchen aspirin EC 81 MG tablet Take 1 tablet (81 mg total) by mouth daily.  . ASTAXANTHIN PO Take 10 mg by mouth daily.   Marland Kitchen atorvastatin (LIPITOR) 40 MG tablet Take 40 mg by mouth daily.   . carvedilol (COREG) 6.25 MG tablet TAKE 2 TABLETS TWICE DAILY  . Coenzyme Q10 100 MG capsule Take 100 mg by mouth 3 (three) times daily.  . furosemide (LASIX) 40 MG tablet Take 1  tablet (40 mg total) by mouth daily.  Marland Kitchen KLOR-CON M10 10 MEQ tablet TAKE 1 TABLET BY MOUTH ONCE DAILY  . lisinopril (PRINIVIL,ZESTRIL) 20 MG tablet Take 1 tablet (20 mg total) by mouth daily.  . metFORMIN (GLUCOPHAGE) 500 MG tablet Take 1,000 mg by mouth 2 (two) times daily with a meal.   . warfarin (COUMADIN) 5 MG tablet Take 1 1/2 tablets daily except 1 tablet on Mondays and Fridays   No facility-administered encounter medications on file as of 01/18/2018.      Review of Systems  Review of Systems  Constitutional: Negative for fever.  Respiratory: Positive for shortness of breath. Negative for hemoptysis and wheezing.   Cardiovascular: Negative for leg swelling.       Physical Exam  BP 128/70 (BP Location: Left Arm, Patient Position: Sitting, Cuff Size: Normal)   Pulse 84   Ht 5\' 11"  (1.803 m)   Wt 221 lb 3.2 oz (100.3 kg)   SpO2 91%   BMI 30.85 kg/m   Wt Readings from Last 5 Encounters:  01/18/18 221 lb 3.2 oz (100.3 kg)  11/22/17 224 lb 9.6 oz (101.9 kg)  11/02/17 222 lb (100.7 kg)  09/16/17 220 lb (99.8 kg)  09/12/17 220 lb (99.8 kg)     Physical Exam  Constitutional: He is oriented to person, place, and time. He appears well-developed and well-nourished. No distress.  Cardiovascular: Normal rate and regular rhythm.  Pulmonary/Chest: Effort normal and breath sounds normal.  Neurological: He is alert and oriented to person, place, and time.  Skin: Skin is warm and dry.  Psychiatric: He has a normal mood and affect.  Nursing note and vitals reviewed.    Lab Results:  CBC    Component Value Date/Time   WBC 11.8 (H) 01/18/2018 1241   RBC 4.26 01/18/2018 1241   HGB 12.2 (L) 01/18/2018 1241   HGB 12.9 (L) 09/12/2017 0958   HCT 36.7 (L) 01/18/2018 1241   HCT 38.2 09/12/2017 0958   PLT 398.0 01/18/2018 1241   PLT 323 09/12/2017 0958   MCV 86.1 01/18/2018 1241   MCV 87 09/12/2017 0958   MCH 29.3 09/12/2017 0958   MCH 29.5 04/28/2016 2158   MCHC 33.4  01/18/2018 1241   RDW 17.2 (H) 01/18/2018 1241   RDW 16.1 (H) 09/12/2017 0958   LYMPHSABS 0.8 01/18/2018 1241   MONOABS 0.9 01/18/2018 1241   EOSABS 0.2 01/18/2018 1241   BASOSABS 0.2 (H) 01/18/2018 1241    BMET    Component Value Date/Time   NA 139 01/18/2018 1241   NA 142 09/12/2017 0958   K 4.3 01/18/2018 1241   CL 103 01/18/2018 1241   CO2 26 01/18/2018 1241   GLUCOSE 91 01/18/2018 1241   BUN 25 (H) 01/18/2018 1241   BUN 21 09/12/2017 0958   CREATININE 1.02 01/18/2018 1241   CALCIUM 9.9 01/18/2018 1241   GFRNONAA 70 09/12/2017 0958   GFRAA 80 09/12/2017 0958   Imaging: Dg Chest 2 View  Result Date: 01/18/2018 CLINICAL DATA:  Exertional shortness of breath. Coronary artery disease, CABG, permanent pacemaker. Former smoker. EXAM: CHEST - 2 VIEW COMPARISON:  Portable chest x-ray of November 09, 2017 FINDINGS: The lungs are well-expanded. The interstitial markings remain coarse. There are small bilateral pleural effusions greatest on the left. There is calcification in the wall of the aortic arch. The heart and pulmonary vascularity are normal. The sternal wires are intact. The ICD is in stable position. IMPRESSION: COPD. Stable small bilateral pleural effusions. No overt CHF. No acute pneumonia. Thoracic aortic atherosclerosis. Electronically Signed   By: David  Martinique M.D.   On: 01/18/2018 12:40     Assessment & Plan:   Dyspnea Patient Instructions  Continue current medications Will check labs and call with results Will check chest x ray and call with results Keep already scheduled appt with Dr. Lake Bells on 01/26/18       Fenton Foy, NP 01/19/2018

## 2018-01-18 NOTE — Patient Instructions (Addendum)
Continue current medications Will check labs and call with results Will check chest x ray and call with results Keep already scheduled appt with Dr. Lake Bells on 01/26/18

## 2018-01-19 ENCOUNTER — Encounter: Payer: Self-pay | Admitting: Nurse Practitioner

## 2018-01-19 NOTE — Progress Notes (Addendum)
Reviewed, agree Currently using and benefitting from CPAP.

## 2018-01-19 NOTE — Assessment & Plan Note (Signed)
Patient Instructions  Continue current medications Will check labs and call with results Will check chest x ray and call with results Keep already scheduled appt with Dr. Lake Bells on 01/26/18

## 2018-01-23 ENCOUNTER — Ambulatory Visit (INDEPENDENT_AMBULATORY_CARE_PROVIDER_SITE_OTHER): Payer: Medicare Other | Admitting: *Deleted

## 2018-01-23 DIAGNOSIS — R001 Bradycardia, unspecified: Secondary | ICD-10-CM | POA: Diagnosis not present

## 2018-01-23 DIAGNOSIS — I4892 Unspecified atrial flutter: Secondary | ICD-10-CM

## 2018-01-23 NOTE — Progress Notes (Signed)
Remote pacemaker transmission.   

## 2018-01-26 ENCOUNTER — Ambulatory Visit (INDEPENDENT_AMBULATORY_CARE_PROVIDER_SITE_OTHER): Payer: Medicare Other | Admitting: Pulmonary Disease

## 2018-01-26 ENCOUNTER — Telehealth: Payer: Self-pay | Admitting: Pulmonary Disease

## 2018-01-26 ENCOUNTER — Encounter: Payer: Self-pay | Admitting: Pulmonary Disease

## 2018-01-26 ENCOUNTER — Other Ambulatory Visit: Payer: Medicare Other

## 2018-01-26 VITALS — BP 122/68 | HR 45 | Wt 222.8 lb

## 2018-01-26 DIAGNOSIS — D649 Anemia, unspecified: Secondary | ICD-10-CM

## 2018-01-26 DIAGNOSIS — J432 Centrilobular emphysema: Secondary | ICD-10-CM

## 2018-01-26 DIAGNOSIS — Z23 Encounter for immunization: Secondary | ICD-10-CM | POA: Diagnosis not present

## 2018-01-26 LAB — RETICULOCYTES
ABS Retic: 73620 cells/uL (ref 25000–9000)
RETIC CT PCT: 1.8 %

## 2018-01-26 MED ORDER — UMECLIDINIUM-VILANTEROL 62.5-25 MCG/INH IN AEPB: 1.0000 | INHALATION_SPRAY | Freq: Every day | RESPIRATORY_TRACT | 0 refills | Status: DC

## 2018-01-26 NOTE — Patient Instructions (Signed)
Centrilobular emphysema: This is the medical term to imply that damage was done to your lungs from cigarette smoke Try taking the sample of Anoro 1 puff daily no matter how you feel, call me if you think it is making you feel better and we will call in a prescription High-dose flu shot when available Practice good hand hygiene We will refer you to pulmonary rehab to help start an exercise routine for this  Physical deconditioning: Pulmonary rehab referral  Restrictive lung disease: This term means that you are not able to take a deep breath I am going to test the strength of your diaphragms with a test called in MIP/MEP  Anemia: Again, it is not clear to me why you have this.  I am going to start with a test of your body's ability to make red blood cells from your bone marrow.  You may need a referral to hematology  We will see you back in 2 to 3 months or sooner if needed

## 2018-01-26 NOTE — Progress Notes (Addendum)
Subjective:   PATIENT ID: Christian Rasmussen GENDER: male DOB: 03/30/1941, MRN: [id number]  Synopsis: Referred in 07/2017 for dyspnea, he has a past medical history significant for systolic heart failure, coronary artery disease and mitral valve repair in 2017 and pacer placed.  He also has atrial fibrillation.  He smoked cigarettes until age 77.  He was found on a high-resolution CT scan in 2019 to have mild centrilobular emphysema and a left-sided pleural effusion which seems to have been present since his coronary artery bypass surgery in 2017.  HPI  Chief Complaint  Patient presents with  . Follow-up   Christian Rasmussen has seen many different doctors and nurse practitioner since he saw me last.  He says that his shortness of breath is no better than it was previously.  He is not exercising.  He does not cough.  He has no chest congestion.  He does not wheeze.  He just feels incredibly short of breath when he tries to exert himself.  He denies chest pain.  He only has some mild leg swelling in his right leg which is been present ever since his coronary artery bypass graft surgery.   Past Medical History:  Diagnosis Date  . 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 Aortic stenosis 10/14/2015  . CAD (coronary artery disease)    status post Taxus stent patency of RCA 2004, Cardiolite negative for ischemia in 2010.  . Diabetes mellitus ([place])   . Dyslipidemia   . History of kidney stones   . Hypertension   . Mitral regurgitation 10/15/2015   Moderate-severe by intra-operative TEE  . Overweight   . Right bundle branch block (RBBB) with left anterior hemiblock   . 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 Snores   . Typical atrial flutter [facility])    s/p ablation 11-23-2012 by Dr Rayann Heman      Review of Systems  Constitutional: Positive for malaise/fatigue. Negative for chills and diaphoresis.  HENT: Negative for congestion, ear discharge and nosebleeds.   Respiratory: Positive for shortness of breath. Negative for cough and wheezing.   Cardiovascular: Negative for chest pain, claudication and leg swelling.      Objective:  Physical Exam   Vitals:   01/26/18 1016  BP: 122/68  Pulse: (!) 45  SpO2: 93%  Weight: 222 lb 12.8 oz (101.1 kg)   RA  Gen: well appearing HENT: OP clear, TM's clear, neck supple PULM: CTA B, normal percussion CV: RRR, no mgr, trace edema GI: BS+, soft, nontender Derm: no cyanosis or rash Psyche: normal mood and affect   CBC    Component Value Date/Time   WBC 11.8 (H) 01/18/2018 1241   RBC 4.26 01/18/2018 1241   HGB 12.2 (L) 01/18/2018 1241   HGB 12.9 (L) 09/12/2017 0958   HCT 36.7 (L) 01/18/2018 1241   HCT 38.2 09/12/2017 0958   PLT 398.0 01/18/2018 1241   PLT 323 09/12/2017 0958   MCV 86.1 01/18/2018 1241   MCV 87 09/12/2017 0958   MCH 29.3 09/12/2017 0958   MCH 29.5 04/28/2016 2158   MCHC 33.4 01/18/2018 1241   RDW 17.2 (H) 01/18/2018 1241  RDW 16.1 (H) 09/12/2017 0958   LYMPHSABS 0.8 01/18/2018 1241   MONOABS 0.9 01/18/2018 1241   EOSABS 0.2 01/18/2018 1241   BASOSABS 0.2 (H) 01/18/2018 1241     Chest imaging: Feb 2019 chest imaging shows a pacemaker, cardiomegaly, chronic appearing left-sided pleural effusion April 2019 high-resolution CT chest images independently reviewed showing no evidence of interstitial lung disease, some mild centrilobular emphysema, small pleural effusion noted on the left  PFT: June 2017 PFT: Ratio 76%, FEV1 2.2 L 66% predicted, FVC 2.83 L 63% predicted, total lung capacity 5.4 L 74% predicted, DLCO 21.1 mL 62% predicted April 2019 ratio 73%, FVC 2.52 L 57% predicted, total  lung capacity 4.4 L 60% predicted, DLCO 13.97 mL 41% predicted  Labs: May 2019 thoracentesis on the left LDH slightly elevated at 107, total protein 3.5, white blood cell count 673 with a lymphocyte predominance at 57%  Path:  Echo: Sep 16, 2017 trans-esophageal echocardiogram showed an LVEF of 50 to 55%, PA pressure estimate of 49 mmHg, moderate aortic regurgitation and moderate mitral stenosis   Heart Catheterization:   Records from multiple visits here reviewed where he was seen after a thoracentesis.     Assessment & Plan:   No diagnosis found.  Discussion: Christian Rasmussen continues to struggle with dyspnea.  I explained that he has multiple reasons for this including centrilobular emphysema, restrictive lung disease, deconditioning, and a history of heart disease.  He is also anemic.  So while none of these are the primary cause for his shortness of breath they all contribute to his sensation of shortness of breath.  In terms of restrictive lung disease I think he has a chest wall problem, I question whether or not there may be diaphragm weakness so I am going to assess a test for that.  He does not have COPD but he does have centrilobular emphysema, sometimes people benefit from bronchodilators in the situation so we will give him a sample of Anoro to try.  He desperately needs to start exercising.  It is not clear to me why he has normocytic anemia, I will start working this up with a reticulocyte count and then consider referral to hematology.  Plan: Centrilobular emphysema: This is the medical term to imply that damage was done to your lungs from cigarette smoke Try taking the sample of Anoro 1 puff daily no matter how you feel, call me if you think it is making you feel better and we will call in a prescription High-dose flu shot when available Practice good hand hygiene We will refer you to pulmonary rehab to help start an exercise routine for this  Physical  deconditioning: Pulmonary rehab referral  OSA: CPAP  Restrictive lung disease: This term means that you are not able to take a deep breath I am going to test the strength of your diaphragms with a test called in MIP/MEP  Anemia: Again, it is not clear to me why you have this.  I am going to start with a test of your body's ability to make red blood cells from your bone marrow.  You may need a referral to hematology  We will see you back in 2 to 3 months or sooner if needed  Current Outpatient Medications:  .  amLODipine (NORVASC) 10 MG tablet, Take 1 tablet (10 mg total) by mouth daily., Disp: 90 tablet, Rfl: 3 .  aspirin EC 81 MG tablet, Take 1 tablet (81 mg total) by mouth daily., Disp: 90 tablet, Rfl: 3 .  ASTAXANTHIN PO, Take 10 mg by mouth daily. , Disp: , Rfl:  .  atorvastatin (LIPITOR) 40 MG tablet, Take 40 mg by mouth daily. , Disp: , Rfl:  .  carvedilol (COREG) 6.25 MG tablet, TAKE 2 TABLETS TWICE DAILY, Disp: 360 tablet, Rfl: 3 .  Coenzyme Q10 100 MG capsule, Take 100 mg by mouth 3 (three) times daily., Disp: , Rfl:  .  furosemide (LASIX) 40 MG tablet, Take 1 tablet (40 mg total) by mouth daily., Disp: 90 tablet, Rfl: 3 .  KLOR-CON M10 10 MEQ tablet, TAKE 1 TABLET BY MOUTH ONCE DAILY, Disp: 90 tablet, Rfl: 3 .  lisinopril (PRINIVIL,ZESTRIL) 20 MG tablet, Take 1 tablet (20 mg total) by mouth daily., Disp: 90 tablet, Rfl: 3 .  metFORMIN (GLUCOPHAGE) 500 MG tablet, Take 1,000 mg by mouth 2 (two) times daily with a meal. , Disp: , Rfl: 2 .  warfarin (COUMADIN) 5 MG tablet, Take 1 1/2 tablets daily except 1 tablet on Mondays and Fridays, Disp: 135 tablet, Rfl: 3

## 2018-01-26 NOTE — Telephone Encounter (Signed)
Order for referral placed per Dr. Lake Bells.

## 2018-01-27 ENCOUNTER — Telehealth: Payer: Self-pay | Admitting: Pulmonary Disease

## 2018-01-27 NOTE — Telephone Encounter (Signed)
Called [place], unable to reach left message to give [place] a call back.

## 2018-01-27 NOTE — Addendum Note (Signed)
Addended by: Dolores Lory on: 01/27/2018 11:33 AM   Modules accepted: Orders

## 2018-01-27 NOTE — Telephone Encounter (Signed)
Spoke with patient in regards to his results (unrelated message) and he had questions about seeing a hematologist. He stated that BJ called him yesterday and explained to him that his bone marrow was not making enough red blood cells thus BQ wanted to refer him to hematologist for further evaluation.   Patient is concerned that this may be cancer. Due to this new issue, he stated that he wishes to not use the Anoro inhaler. He did not notice a difference while taking the medication and since he was told he does not have COPD, doesn't feel it is necessary.   He wants to know if BQ believes he has cancer and is it ok for him to D/C the Anoro.   BQ, please advise. Thanks!

## 2018-01-27 NOTE — Telephone Encounter (Signed)
OV notes have already been faxed back-Brittany informed Christian Rasmussen about this. Nothing more needed at this time.

## 2018-01-27 NOTE — Telephone Encounter (Signed)
[facility] to confirm whats needed.X1

## 2018-01-27 NOTE — Telephone Encounter (Signed)
Spoke with Danae Chen, informed that OV were being faxed. Voiced understanding. Nothing further needed at this time.

## 2018-01-27 NOTE — Telephone Encounter (Signed)
Called and spoke to Christian Rasmussen. Advised him that Dr. Lake Bells would still like him to take the Anoro as planned. Patient verbalized understanding. Nothing further needed at this time.

## 2018-01-30 ENCOUNTER — Telehealth: Payer: Self-pay | Admitting: Pulmonary Disease

## 2018-01-30 NOTE — Telephone Encounter (Signed)
Christian Rasmussen is calling back [phone] 

## 2018-01-30 NOTE — Telephone Encounter (Signed)
Stay on current settings

## 2018-01-30 NOTE — Telephone Encounter (Signed)
Called spoke with [place] w/ [facility] Per Danae Chen patient's 9.19.19 office visit with BQ was for a CPAP follow up but the note does not mention that patient is wearing and benefitting  Dr Lake Bells can this be addended?  Thank you.  Per 9.19.19 office visit: Plan: Centrilobular emphysema: This is the medical term to imply that damage was done to your lungs from cigarette smoke Try taking the sample of Anoro 1 puff daily no matter how you feel, call me if you think it is making you feel better and we will call in a prescription High-dose flu shot when available Practice good hand hygiene We will refer you to pulmonary rehab to help start an exercise routine for this   Physical deconditioning: Pulmonary rehab referral   OSA: CPAP   Restrictive lung disease: This term means that you are not able to take a deep breath I am going to test the strength of your diaphragms with a test called in MIP/MEP   Anemia: Again, it is not clear to me why you have this.  I am going to start with a test of your body's ability to make red blood cells from your bone marrow.  You may need a referral to hematology   We will see you back in 2 to 3 months or sooner if needed

## 2018-01-30 NOTE — Telephone Encounter (Signed)
Called the patient and let him know result made by  Dr. Annamaria Boots . He says that he never received these results. He has start the cpap in August 2019 and feels this is going okay.  But pre Dr. Bertrum Sol recommendation the patient needed a cpap titration. But never had one done.   Dr. Lake Bells please advise if you would like to have him a cpap titration or stay on current pressure settings.

## 2018-01-30 NOTE — Telephone Encounter (Signed)
Patient is aware and is following up on new cpap on 01/01/18  He is due for compliance.

## 2018-01-30 NOTE — Telephone Encounter (Signed)
done

## 2018-01-30 NOTE — Telephone Encounter (Signed)
Ok, faxed to [facility] at the fax number given. Nothing further is needed.

## 2018-01-30 NOTE — Telephone Encounter (Signed)
Spoke with Danae Chen, she is requesting Dr. Lake Bells to addend his note to state that the patient is using and benefiting from CPAP. Please let us know when you addend note. Thanks Dr. Lake Bells.    Please fax to [facility] when completed   Fax number [phone]

## 2018-01-31 ENCOUNTER — Encounter: Payer: Self-pay | Admitting: Nurse Practitioner

## 2018-01-31 ENCOUNTER — Encounter: Payer: Self-pay | Admitting: Internal Medicine

## 2018-01-31 ENCOUNTER — Ambulatory Visit (INDEPENDENT_AMBULATORY_CARE_PROVIDER_SITE_OTHER): Payer: Medicare Other | Admitting: Internal Medicine

## 2018-01-31 ENCOUNTER — Ambulatory Visit (INDEPENDENT_AMBULATORY_CARE_PROVIDER_SITE_OTHER): Payer: Medicare Other | Admitting: Nurse Practitioner

## 2018-01-31 VITALS — BP 114/66 | HR 89 | Ht 71.0 in | Wt 220.8 lb

## 2018-01-31 DIAGNOSIS — I5042 Chronic combined systolic (congestive) and diastolic (congestive) heart failure: Secondary | ICD-10-CM | POA: Diagnosis not present

## 2018-01-31 DIAGNOSIS — G4733 Obstructive sleep apnea (adult) (pediatric): Secondary | ICD-10-CM

## 2018-01-31 DIAGNOSIS — Z950 Presence of cardiac pacemaker: Secondary | ICD-10-CM | POA: Diagnosis not present

## 2018-01-31 DIAGNOSIS — I4891 Unspecified atrial fibrillation: Secondary | ICD-10-CM | POA: Diagnosis not present

## 2018-01-31 DIAGNOSIS — I442 Atrioventricular block, complete: Secondary | ICD-10-CM

## 2018-01-31 DIAGNOSIS — Z951 Presence of aortocoronary bypass graft: Secondary | ICD-10-CM | POA: Diagnosis not present

## 2018-01-31 LAB — CUP PACEART INCLINIC DEVICE CHECK
Brady Statistic RV Percent Paced: 94 %
Date Time Interrogation Session: 20190924040000
Implantable Lead Implant Date: 20170612
Implantable Lead Location: 753860
Implantable Lead Model: 7741
Implantable Lead Serial Number: 760165
Lead Channel Impedance Value: 637 Ohm
Lead Channel Impedance Value: 698 Ohm
Lead Channel Pacing Threshold Amplitude: 0.8 V
Lead Channel Pacing Threshold Pulse Width: 0.4 ms
Lead Channel Pacing Threshold Pulse Width: 0.4 ms
Lead Channel Setting Pacing Amplitude: 2 V
Lead Channel Setting Sensing Sensitivity: 2.5 mV
MDC IDC LEAD IMPLANT DT: 20170612
MDC IDC LEAD LOCATION: 753859
MDC IDC LEAD SERIAL: 733074
MDC IDC MSMT LEADCHNL RA PACING THRESHOLD AMPLITUDE: 1 V
MDC IDC MSMT LEADCHNL RA SENSING INTR AMPL: 4.3 mV
MDC IDC PG IMPLANT DT: 20170612
MDC IDC SET LEADCHNL RV PACING AMPLITUDE: 2.5 V
MDC IDC SET LEADCHNL RV PACING PULSEWIDTH: 0.4 ms
MDC IDC STAT BRADY RA PERCENT PACED: 1 % — AB
Pulse Gen Serial Number: 724121

## 2018-01-31 MED ORDER — POTASSIUM CHLORIDE ER 10 MEQ PO TBCR: 10.0000 meq | EXTENDED_RELEASE_TABLET | Freq: Two times a day (BID) | ORAL | 3 refills | Status: DC

## 2018-01-31 MED ORDER — FUROSEMIDE 40 MG PO TABS: 40.0000 mg | ORAL_TABLET | Freq: Two times a day (BID) | ORAL | 3 refills | Status: DC

## 2018-01-31 NOTE — Progress Notes (Signed)
@Patient  ID: Christian Rasmussen, male    DOB: 1940/05/14, 77 y.o.   MRN: [id number]  Chief Complaint  Patient presents with  . Follow-up    CPAP    Referring provider: Monico Blitz, MD  HPI 77 year old male former smoker with mild centrilobular emphysema and left sided pleural effusion followed by Dr. Lake Bells.  Health history includes systolic heart failure, CAD, mitral valva repair 2017 with pacer placed, a-fib.  Tests: Chest imaging: Feb 2019 chest imaging shows a pacemaker, cardiomegaly, chronic appearing left-sided pleural effusion April 2019 high-resolution CT chest images independently reviewed showing no evidence of interstitial lung disease, some mild centrilobular emphysema, small pleural effusion noted on the left  PFT: June 2017 PFT: Ratio 76%, FEV1 2.2 L 66% predicted, FVC 2.83 L 63% predicted, total lung capacity 5.4 L 74% predicted, DLCO 21.1 mL 62% predicted April 2019 ratio 73%, FVC 2.52 L 57% predicted, total lung capacity 4.4 L 60% predicted, DLCO 13.97 mL 41% predicted  Labs: May 2019 thoracentesis on the left LDH slightly elevated at 107, total protein 3.5, white blood cell count 673 with a lymphocyte predominance at 57%   Echo: Sep 16, 2017 trans-esophageal echocardiogram showed an LVEF of 50 to 55%, PA pressure estimate of 49 mmHg, moderate aortic regurgitation and moderate mitral stenosis    OV 02/01/18 - CPAP follow up Patient presents for a follow up on his new CPAP. He states that he has been compliant with the CPAP and benefits from using it. He feels much better throughout the day and has more energy. Denies any issues with the machine or mask. Wife states that he no longer snores. He sleeps well throughout the night. Falls asleep within minutes of laying down. He denies any chest pain, peripheral edema, or fever.   CPAP compliance report 12/31/17 - 01/29/18 Usage days 30/30 (100%), average usage 6 hours 38 minutes, CPAP autoset 5-15 cm H20, AHI: 2.2.    No Known Allergies  Immunization History  Administered Date(s) Administered  . Influenza,inj,quad, With Preservative 02/12/2017  . Pneumococcal Conjugate-13 05/10/2016  . Zoster Recombinat (Shingrix) 03/10/2017, 06/16/2017    Past Medical History:  Diagnosis Date  . 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 Aortic stenosis 10/14/2015  . CAD (coronary artery disease)    status post Taxus stent patency of RCA 2004, Cardiolite negative for ischemia in 2010.  . Diabetes mellitus ([place])   . Dyslipidemia   . History of kidney stones   . Hypertension   . Mitral regurgitation 10/15/2015   Moderate-severe by intra-operative TEE  . Overweight   . Right bundle branch block (RBBB) with left anterior hemiblock   . 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 Snores   . Typical atrial flutter [facility])    s/p ablation 11-23-2012 by Dr Rayann Heman    Tobacco History: Social History   Tobacco Use  Smoking Status Former Smoker  . Packs/day: 1.00  . Years: 28.00  . Pack years: 28.00  . Types: Cigarettes  . Start date: 10/21/1956  . Last attempt to quit: 05/10/1985  . Years since quitting: 32.7  Smokeless Tobacco Never Used   Counseling given: Yes  Outpatient Encounter Medications as of 01/31/2018  Medication Sig  . amLODipine (NORVASC) 10 MG tablet Take 1 tablet (10 mg total) by mouth daily.  Marland Kitchen aspirin EC 81 MG tablet Take 1 tablet (81 mg total) by mouth daily.  . ASTAXANTHIN PO Take 10 mg by mouth daily.   Marland Kitchen atorvastatin (LIPITOR) 40 MG tablet Take 40 mg by mouth daily.   . carvedilol (COREG) 6.25 MG tablet TAKE 2 TABLETS TWICE DAILY  . Coenzyme Q10 100 MG capsule Take  100 mg by mouth daily.   . furosemide (LASIX) 40 MG tablet Take 1 tablet (40 mg total) by mouth 2 (two) times daily.  Marland Kitchen lisinopril (PRINIVIL,ZESTRIL) 20 MG tablet Take 1 tablet (20 mg total) by mouth daily.  . metFORMIN (GLUCOPHAGE) 500 MG tablet Take 1,000 mg by mouth 2 (two) times daily with a meal.   . potassium chloride (KLOR-CON 10) 10 MEQ tablet Take 1 tablet (10 mEq total) by mouth 2 (two) times daily.  Marland Kitchen umeclidinium-vilanterol (ANORO ELLIPTA) 62.5-25 MCG/INH AEPB Inhale 1 puff into the lungs daily.  Marland Kitchen warfarin (COUMADIN) 5 MG tablet Take 1 1/2 tablets daily except 1 tablet on Mondays and Fridays   No facility-administered encounter medications on file as of 01/31/2018.      Review of Systems  Review of Systems  Constitutional: Negative.  Negative for chills and fever.  HENT: Negative.   Respiratory: Positive for shortness of breath. Negative for cough and wheezing.   Cardiovascular: Negative.  Negative for chest pain, palpitations and leg swelling.  Gastrointestinal: Negative.   Allergic/Immunologic: Negative.   Neurological: Negative.   Psychiatric/Behavioral: Negative.        Physical Exam  BP 128/70 (BP Location: Left Arm, Patient Position: Sitting, Cuff Size: Normal)   Pulse 84   Ht 5\' 11"  (1.803 m)   Wt 222 lb 3.2 oz (100.8 kg)   SpO2 91%   BMI 30.99 kg/m   Wt Readings from Last 5 Encounters:  01/31/18 222 lb 3.2 oz (100.8 kg)  01/31/18 220 lb 12.8 oz (100.2 kg)  01/26/18 222 lb 12.8 oz (101.1 kg)  01/18/18 221 lb 3.2 oz (100.3 kg)  11/22/17 224 lb 9.6 oz (101.9 kg)     Physical Exam  Constitutional: He is oriented to person, place, and time. He appears well-developed and well-nourished. No distress.  Cardiovascular: Normal rate and regular rhythm.  Pulmonary/Chest: Effort normal and breath sounds normal.  Musculoskeletal: He exhibits no edema.  Neurological: He is alert and oriented to person, place, and time.  Skin: Skin is warm and dry.    Psychiatric: He has a normal mood and affect.  Nursing note and vitals reviewed.   Imaging: Dg Chest 2 View  Result Date: 01/18/2018 CLINICAL DATA:  Exertional shortness of breath. Coronary artery disease, CABG, permanent pacemaker. Former smoker. EXAM: CHEST - 2 VIEW COMPARISON:  Portable chest x-ray of November 09, 2017 FINDINGS: The lungs are well-expanded. The interstitial markings remain coarse. There are small bilateral pleural effusions greatest on the left. There is calcification in the wall of the aortic arch. The heart and pulmonary vascularity are normal. The sternal wires are intact. The ICD is in stable position. IMPRESSION: COPD. Stable small bilateral pleural effusions. No overt CHF. No acute pneumonia. Thoracic aortic atherosclerosis. Electronically Signed   By: David  Martinique M.D.   On: 01/18/2018 12:40     Assessment & Plan:   OSA (obstructive sleep apnea) Patient benefits from CPAP with good compliance and control documented  Patient Instructions  Continue CPAP at current settings Goal of at least 4 hours per night Do no drive if drowsy Maintain healthy weight Follow up with Dr. Lake Bells as directed or sooner if needed          Fenton Foy, NP 02/01/2018

## 2018-01-31 NOTE — Patient Instructions (Addendum)
Medication Instructions:  Your physician has recommended you make the following change in your medication:  1.  Increase your lasix 40 mg-  Take one tablet by mouth twice a day 2.  Increase your potassium 10 meq- Take one tablet by mouth twice a day  Labwork: None ordered.  Testing/Procedures: None ordered.  Follow-Up: Your physician wants you to follow-up in: one year with Dr. Lovena Le.   You will receive a reminder letter in the mail two months in advance. If you don't receive a letter, please call our office to schedule the follow-up appointment.  Remote monitoring is used to monitor your Pacemaker from home. This monitoring reduces the number of office visits required to check your device to one time per year. It allows [place] to keep an eye on the functioning of your device to ensure it is working properly. You are scheduled for a device check from home on 04/24/2018. You may send your transmission at any time that day. If you have a wireless device, the transmission will be sent automatically. After your physician reviews your transmission, you will receive a postcard with your next transmission date.  Any Other Special Instructions Will Be Listed Below (If Applicable).  If you need a refill on your cardiac medications before your next appointment, please call your pharmacy.

## 2018-01-31 NOTE — Progress Notes (Signed)
HPI [name] returns today for follow-up. He is a very pleasant 77 year old man with a history of complete heart block, status post mitral valve surgery, diastolic heart failure, and obesity. In the interim, he admits to sodium indiscretion, and increasing dyspnea with exertion. His wife who is with him today states that he has become very sedentary. No chest pain, no syncope. Evaluation so far has demonstrated his pulmonary pressures approaching 50 and AI and MS, though neither is severe. His CHF symptoms are class 3A. His EF was near  normal.  No Known Allergies   Current Outpatient Medications  Medication Sig Dispense Refill  . amLODipine (NORVASC) 10 MG tablet Take 1 tablet (10 mg total) by mouth daily. 90 tablet 3  . aspirin EC 81 MG tablet Take 1 tablet (81 mg total) by mouth daily. 90 tablet 3  . ASTAXANTHIN PO Take 10 mg by mouth daily.     Marland Kitchen atorvastatin (LIPITOR) 40 MG tablet Take 40 mg by mouth daily.     . carvedilol (COREG) 6.25 MG tablet TAKE 2 TABLETS TWICE DAILY 360 tablet 3  . Coenzyme Q10 100 MG capsule Take 100 mg by mouth daily.     . furosemide (LASIX) 40 MG tablet Take 1 tablet (40 mg total) by mouth daily. 90 tablet 3  . KLOR-CON M10 10 MEQ tablet TAKE 1 TABLET BY MOUTH ONCE DAILY 90 tablet 3  . lisinopril (PRINIVIL,ZESTRIL) 20 MG tablet Take 1 tablet (20 mg total) by mouth daily. 90 tablet 3  . metFORMIN (GLUCOPHAGE) 500 MG tablet Take 1,000 mg by mouth 2 (two) times daily with a meal.   2  . umeclidinium-vilanterol (ANORO ELLIPTA) 62.5-25 MCG/INH AEPB Inhale 1 puff into the lungs daily. 2 each 0  . warfarin (COUMADIN) 5 MG tablet Take 1 1/2 tablets daily except 1 tablet on Mondays and Fridays 135 tablet 3   No current facility-administered medications for this visit.      Past Medical History:  Diagnosis Date  . 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 Aortic stenosis 10/14/2015  . CAD (coronary artery disease)    status post Taxus stent  patency of RCA 2004, Cardiolite negative for ischemia in 2010.  . Diabetes mellitus ([place])   . Dyslipidemia   . History of kidney stones   . Hypertension   . Mitral regurgitation 10/15/2015   Moderate-severe by intra-operative TEE  . Overweight   . Right bundle branch block (RBBB) with left anterior hemiblock   . 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 Snores   . Typical atrial flutter ([place])    s/p ablation 11-23-2012 by Dr Rayann Heman    ROS:   All systems reviewed and negative except as noted in the HPI.   Past Surgical History:  Procedure Laterality Date  . ABLATION  11-23-2012   s/p cavotricuspid isthmus ablation by Dr Rayann Heman  . ANKLE SURGERY Left    total ankle replacement  . ATRIAL FLUTTER ABLATION N/A 11/23/2012   Procedure: ATRIAL FLUTTER ABLATION;  Surgeon: Thompson Grayer, MD;  Location[facility] C[facility] LAB;  Service: Cardiovascular;  Laterality: N/A;  . CARDIAC CATHETERIZATION N/A 10/13/2015  Procedure: Left Heart Cath and Coronary Angiography;  Surgeon: Peter M Martinique, MD;  Location: [place] CV LAB;  Service: Cardiovascular;  Laterality: N/A;  . CATARACT EXTRACTION Right   . CATARACT EXTRACTION W/PHACO Left 09/13/2013   Procedure: CATARACT EXTRACTION PHACO AND INTRAOCULAR LENS PLACEMENT (IOC);  Surgeon: Tonny Branch, MD;  Location: [facility] ORS;  Service: Ophthalmology;  Laterality: Left;  CDE 9.38  . COLONOSCOPY N/A 10/10/2014   Procedure: COLONOSCOPY;  Surgeon: Rogene Houston, MD;  Location: [facility] ENDO SUITE;  Service: Endoscopy;  Laterality: N/A;  830  . CORONARY ANGIOPLASTY     3 stents  . CORONARY ARTERY BYPASS GRAFT N/A 10/15/2015   Procedure: CORONARY ARTERY BYPASS GRAFTING  (CABG) X4 LIMA-LAD; SEQ SVG-PD-PL; SVG-OM1 ENDOSCOPIC GREATER SAPHENOUS VEIN HARVEST(EVH) BILAT LOWER EXTREM;  Surgeon: Rexene Alberts, MD;  Location: [place];  Service: Open Heart Surgery;  Laterality: N/A;  . EP IMPLANTABLE DEVICE N/A 10/20/2015   Procedure: Pacemaker Implant;  Surgeon: Evans Lance, MD;  Location: [place] CV LAB;  Service: Cardiovascular;  Laterality: N/A;  . MITRAL VALVE REPAIR N/A 10/15/2015   Procedure: MITRAL VALVE REPAIR (MVR), # 28 MEMO 3-D RING ANNULOPLASTY AND COMPLEX VALVE REPAIR;  Surgeon: Rexene Alberts, MD;  Location: [place] [facility] [place];  Service: Open Heart Surgery;  Laterality: N/A;  . TEE WITHOUT CARDIOVERSION N/A 10/15/2015   Procedure: TRANSESOPHAGEAL ECHOCARDIOGRAM (TEE);  Surgeon: Rexene Alberts, MD;  Location: [place];  Service: Open Heart Surgery;  Laterality: N/A;  . TEE WITHOUT CARDIOVERSION N/A 09/16/2017   Procedure: TRANSESOPHAGEAL ECHOCARDIOGRAM (TEE);  Surgeon: Dorothy Spark, MD;  Location: [facility] ENDOSCOPY;  Service: Cardiovascular;  Laterality: N/A;     Family History  Problem Relation Age of Onset  . Other Father 90       Died from MI.  . Other Mother        alive & well  . Leukemia Brother 48       died     Social History   Socioeconomic History  . Marital status: Married    Spouse name: Not on file  . Number of children: 1  . Years of education: Not on file  . Highest education level: Not on file  Occupational History  . 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 Financial resource strain: Not on file  . Food insecurity:    Worry: Not on file    Inability: Not on file  . Transportation needs:    Medical: Not on file    Non-medical: Not on file  Tobacco Use  . Smoking status: Former Smoker    Packs/day: 1.00    Years: 28.00    Pack years: 28.00    Types: Cigarettes    Start date: 10/21/1956    Last attempt to quit: 05/10/1985    Years since quitting: 32.7  . Smokeless tobacco: Never Used  Substance and Sexual  Activity  . Alcohol use: Yes    Alcohol/week: 0.0 standard drinks    Comment: occasional wine  . Drug use: No  . Sexual activity: Yes    Birth control/protection: None  Lifestyle  . Physical activity:    Days per week: Not on file    Minutes per session: Not on file  . Stress: Not on file  Relationships  . Social connections:    Talks on phone: Not on file    Gets together: Not on file    Attends religious service: Not on file    Active  member of club or organization: Not on file    Attends meetings of clubs or organizations: Not on file    Relationship status: Not on file  . Intimate partner violence:    Fear of current or ex partner: Not on file    Emotionally abused: Not on file    Physically abused: Not on file    Forced sexual activity: Not on file  Other Topics Concern  . Not on file  Social History Narrative   Lives in [place] with spouse.   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     BP 114/66   Pulse 89   Ht 5\' 11"  (1.803 m)   Wt 220 lb 12.8 oz (100.2 kg)   SpO2 90%   BMI 30.80 kg/m   Physical Exam:  Well appearing 77 yo man, NAD HEENT: Unremarkable Neck:  6 cm JVD, no thyromegally Lymphatics:  No adenopathy Back:  No CVA tenderness Lungs:  Clear with no wheezes HEART:  Regular rate rhythm, no murmurs, no rubs, no clicks Abd:  soft, positive bowel sounds, no organomegally, no rebound, no guarding Ext:  2 plus pulses, no edema, no cyanosis, no clubbing Skin:  No rashes no nodules Neuro:  CN II through XII intact, motor grossly intact  EKG - nsr with ventricular pacing  DEVICE  Normal device function.  See PaceArt for details.   Assess/Plan: 1. CHB - he is stable, s/p PM insertion. No escape today 2. PPM - his medtronic DDD PM is working normally. We will recheck in several months. 3. Chronic diastolic heart failure - this is multifactorial. I have asked him to increase his lasix to 40 bid and take additional potassium.   Mikle [email or web address].D.

## 2018-01-31 NOTE — Patient Instructions (Signed)
Continue CPAP at current settings Goal of at least 4 hours per night Do no drive if drowsy Maintain healthy weight Follow up with Dr. Lake Bells as directed or sooner if needed

## 2018-02-01 ENCOUNTER — Inpatient Hospital Stay (HOSPITAL_COMMUNITY): Payer: Medicare Other | Attending: Internal Medicine | Admitting: Internal Medicine

## 2018-02-01 ENCOUNTER — Ambulatory Visit (HOSPITAL_COMMUNITY)
Admission: RE | Admit: 2018-02-01 | Discharge: 2018-02-01 | Disposition: A | Discharge status: Home / Self Care | Payer: Medicare Other | Source: Ambulatory Visit | Attending: Internal Medicine | Admitting: Internal Medicine

## 2018-02-01 ENCOUNTER — Other Ambulatory Visit (HOSPITAL_COMMUNITY): Payer: Medicare Other

## 2018-02-01 ENCOUNTER — Ambulatory Visit: Payer: Medicare Other | Admitting: Nurse Practitioner

## 2018-02-01 ENCOUNTER — Encounter (HOSPITAL_COMMUNITY): Payer: Self-pay | Admitting: Internal Medicine

## 2018-02-01 ENCOUNTER — Encounter: Payer: Self-pay | Admitting: Nurse Practitioner

## 2018-02-01 VITALS — BP 117/87 | HR 55 | Temp 97.5°F | Resp 18 | Ht 71.0 in | Wt 222.2 lb

## 2018-02-01 DIAGNOSIS — I517 Cardiomegaly: Secondary | ICD-10-CM | POA: Insufficient documentation

## 2018-02-01 DIAGNOSIS — R5383 Other fatigue: Secondary | ICD-10-CM | POA: Diagnosis not present

## 2018-02-01 DIAGNOSIS — M7989 Other specified soft tissue disorders: Secondary | ICD-10-CM | POA: Diagnosis not present

## 2018-02-01 DIAGNOSIS — Z87891 Personal history of nicotine dependence: Secondary | ICD-10-CM | POA: Diagnosis not present

## 2018-02-01 DIAGNOSIS — R918 Other nonspecific abnormal finding of lung field: Secondary | ICD-10-CM | POA: Insufficient documentation

## 2018-02-01 DIAGNOSIS — I700 Atherosclerosis of aorta: Secondary | ICD-10-CM | POA: Diagnosis not present

## 2018-02-01 DIAGNOSIS — I10 Essential (primary) hypertension: Secondary | ICD-10-CM | POA: Insufficient documentation

## 2018-02-01 DIAGNOSIS — R0602 Shortness of breath: Secondary | ICD-10-CM | POA: Diagnosis not present

## 2018-02-01 DIAGNOSIS — I25110 Atherosclerotic heart disease of native coronary artery with unstable angina pectoris: Secondary | ICD-10-CM | POA: Insufficient documentation

## 2018-02-01 DIAGNOSIS — J439 Emphysema, unspecified: Secondary | ICD-10-CM

## 2018-02-01 DIAGNOSIS — J90 Pleural effusion, not elsewhere classified: Secondary | ICD-10-CM | POA: Insufficient documentation

## 2018-02-01 DIAGNOSIS — D509 Iron deficiency anemia, unspecified: Secondary | ICD-10-CM | POA: Diagnosis not present

## 2018-02-01 DIAGNOSIS — M2550 Pain in unspecified joint: Secondary | ICD-10-CM

## 2018-02-01 DIAGNOSIS — Z951 Presence of aortocoronary bypass graft: Secondary | ICD-10-CM

## 2018-02-01 DIAGNOSIS — J432 Centrilobular emphysema: Secondary | ICD-10-CM | POA: Insufficient documentation

## 2018-02-01 DIAGNOSIS — Z7901 Long term (current) use of anticoagulants: Secondary | ICD-10-CM | POA: Insufficient documentation

## 2018-02-01 DIAGNOSIS — D508 Other iron deficiency anemias: Secondary | ICD-10-CM

## 2018-02-01 MED ORDER — IOPAMIDOL (ISOVUE-370) INJECTION 76%
100.0000 mL | Freq: Once | INTRAVENOUS | Status: AC | PRN
  Administered 2018-02-01: 100 mL via INTRAVENOUS

## 2018-02-01 NOTE — Patient Instructions (Signed)
[facility] at [facility] Discharge Instructions  You saw Dr. Higgs today.   Thank you for choosing [facility] at [facility] to provide your oncology and hematology care.  To afford each patient quality time with our provider, please arrive at least 15 minutes before your scheduled appointment time.   If you have a lab appointment with the Cancer Center please come in thru the  Main Entrance and check in at the main information desk  You need to re-schedule your appointment should you arrive 10 or more minutes late.  We strive to give you quality time with our providers, and arriving late affects you and other patients whose appointments are after yours.  Also, if you no show three or more times for appointments you may be dismissed from the clinic at the providers discretion.     Again, thank you for choosing [facility].  Our hope is that these requests will decrease the amount of time that you wait before being seen by our physicians.       _____________________________________________________________  Should you have questions after your visit to [facility], please contact our office at [phone] between the hours of 8:00 a.m. and 4:30 p.m.  Voicemails left after 4:00 p.m. will not be returned until the following business day.  For prescription refill requests, have your pharmacy contact our office and allow 72 hours.    Cancer Center Support Programs:   > Cancer Support Group  2nd Tuesday of the month 1pm-2pm, Journey Room    

## 2018-02-01 NOTE — Assessment & Plan Note (Signed)
Patient benefits from CPAP with good compliance and control documented   Patient Instructions  Continue CPAP at current settings Goal of at least 4 hours per night Do no drive if drowsy Maintain healthy weight Follow up with Dr. Lake Bells as directed or sooner if needed

## 2018-02-01 NOTE — Progress Notes (Signed)
Referring Physician:  Dr. Simonne Maffucci  Diagnosis Other iron deficiency anemia - Plan: CBC with Differential/Platelet, Comprehensive metabolic panel, Lactate dehydrogenase, Protein electrophoresis, serum, Ferritin, Iron and TIBC, Transferrin Saturation, Haptoglobin, Vitamin B12, Folate, Sedimentation rate, C-reactive protein, Rheumatoid factor  Shortness of breath - Plan: CT ANGIO CHEST PE W OR WO CONTRAST, US Venous Img Lower Bilateral  Staging Cancer Staging No matching staging information was found for the patient.  Assessment and Plan:  1.  Normocytic anemia.  Labs that were done 01/18/2018 were reviewed and showed a white count of 11.8 hemoglobin 12.2 MCV was 86 platelets 398,000.  His creatinine was 1 and potassium was 4.3.  I discussed with the patient his hemoglobin is within normal limits at 12.  We will repeat labs today and check iron levels, haptoglobin, B12, folate. Reticulocytosis nonspecific finding.   He will return to clinic to go over his lab studies.  2.  Fatigue.  I discussed with him hemoglobin was 11.8.  He reported extreme fatigue with walking.  Pulse ox with ambulation was checked in the clinic and his oxygen level decreased to 87%.  Patient is set up for a CT Angio of the chest to evaluate for PE.  He is currently on Coumadin.  He will be notified of results.  3.  Right Leg swelling.  This is likely related to his CABG surgery.  Due to decreased oxygenation with ambulation he will undergo bilateral lower extremity Dopplers for further evaluation.  4.  Pulmonary nodules.  Patient had a CT of the chest done April 2019 that was reviewed and showed  Impression: 1. No evidence of interstitial lung disease. 2. Scattered small solid pulmonary nodules in the right lung, largest 5 mm. No follow-up needed if patient is low-risk (and has no known or suspected primary neoplasm). Non-contrast chest CT can be considered in 12 months if patient is high-risk. This  recommendation follows the consensus statement: Guidelines for Management of Incidental Pulmonary Nodules Detected on CT Images:From the Fleischner Society 2017; published online before print (10.1148/[id number]). 3. Trace right and small left dependent pleural effusions. 4. Dilated main pulmonary artery, suggesting pulmonary arterial hypertension. 5. Mild interlobular septal thickening throughout the lungs, suggesting mild pulmonary edema.  Aortic Atherosclerosis (ICD10-I70.0) and Emphysema (ICD10-J43.9).  Will review results of CT Angio once performed.  May consider additional imaging.    5.  CAD.  Follow-up with Cardiology as directed.    6.  HTN.  BP is 117/87.  Follow-up with PCP.    7.  Joint pain.  Will check CRP, RF, Sed rate, SPEP.    Greater than 60 minutes spent with more than 50% spent in counseling and coordination of care.    HPI: 77 year old male referred for evaluation of anemia.  He has a history of coronary artery disease and has undergone CABG in the past.  He also has sleep apnea.  He has been seen recently by pulmonary.  He reports difficulty with ambulation and has extreme fatigue.  Labs done 01/18/2018 reviewed and showed white count 11.8 hemoglobin 12.2 platelets 398,000.  He had a normal differential.  Potassium was normal at 4.3 creatinine was 1.  Patient smoked in the past reportedly 2 packs/day for 15 years but has stopped smoking for more than 30 years.  He reports he had fluid on his lung and had some drained around the time he had his cardiac surgery.  He denies any blood in his stool or his urine.  He has never been  told he had thyroid problems.  Patient is also reporting some swelling of his right leg.  This is the leg that the graft was taken from for his surgery.  He reports occasional joint symptoms.  He reports he takes Lasix.  Patient is seen today for consultation due to anemia.  Problem List Patient Active Problem List   Diagnosis Date Noted   . OSA (obstructive sleep apnea) [G47.33] 11/22/2017  . Aortic valve disease [I35.9]   . Dyspnea [R06.00] 08/23/2017  . Lung nodules [R91.8] 08/23/2017  . Hypoxia [R09.02]   . Pleural effusion, bilateral [J90]   . Acute diastolic CHF (congestive heart failure) ([place]) [I50.31] 03/19/2016  . Edema extremities [R60.0] 12/24/2015  . 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 Mitral valve disease [I05.9] 10/15/2015  . Aortic insufficiency [I35.1] 10/15/2015  . Aortic stenosis [I35.0] 10/14/2015  . NSTEMI (non-ST elevated myocardial infarction) ([place]) [I21.4] 10/12/2015  . Unstable angina pectoris ([place]) [I20.0] 10/12/2015  . Mild aortic stenosis [I35.0] 04/13/2013  . Atrial flutter ([place]) [I48.92] 11/09/2012  . Snoring [R06.83] 11/09/2012  . 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 Hypertension [I10]   . CAD (coronary artery disease) [I25.10]   . DYSLIPIDEMIA [E78.5] 04/11/2008  . CORONARY ATHEROSCLEROSIS NATIVE CORONARY ARTERY [I25.10] 04/11/2008  . PERCUTANEOUS TRANSLUMINAL CORONARY ANGIOPLASTY, HX OF [Z98.61] 04/11/2008    Past Medical History Past Medical History:  Diagnosis Date  . 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 Aortic stenosis 10/14/2015  . CAD (coronary artery disease)    status post Taxus stent patency of RCA 2004, Cardiolite negative for ischemia in 2010.  . Diabetes mellitus ([place])   . Dyslipidemia   . History of kidney stones   . Hypertension   . Mitral regurgitation 10/15/2015   Moderate-severe by intra-operative TEE  . Overweight   . Right bundle branch block (RBBB) with left anterior hemiblock   . 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 Snores   . Typical atrial flutter [facility])    s/p ablation 11-23-2012 by Dr Rayann Heman    Past Surgical History Past Surgical History:  Procedure Laterality Date  . ABLATION  11-23-2012   s/p cavotricuspid isthmus ablation by Dr Rayann Heman  . ANKLE SURGERY Left    total ankle replacement  . ATRIAL FLUTTER ABLATION N/A 11/23/2012   Procedure: ATRIAL FLUTTER ABLATION;  Surgeon: Thompson Grayer, MD;  Location[facility] CATH LAB;  Service: Cardiovascular;  Laterality: N/A;  . CARDIAC CATHETERIZATION N/A 10/13/2015   Procedure: Left Heart Cath and Coronary Angiography;  Surgeon: Peter M Martinique, MD;  Location: [place] CV LAB;  Service: Cardiovascular;  Laterality: N/A;  . CATARACT EXTRACTION Right   . CATARACT EXTRACTION W/PHACO Left 09/13/2013   Procedure: CATARACT EXTRACTION PHACO AND INTRAOCULAR LENS PLACEMENT (IOC);  Surgeon: Tonny Branch, MD;  Location: [facility] ORS;  Service: Ophthalmology;  Laterality: Left;  CDE 9.38  . COLONOSCOPY N/A 10/10/2014   Procedure: COLONOSCOPY;  Surgeon: Rogene Houston, MD;  Location: [facility] ENDO SUITE;  Service: Endoscopy;  Laterality: N/A;  830  . CORONARY ANGIOPLASTY  3 stents  . CORONARY ARTERY BYPASS GRAFT N/A 10/15/2015   Procedure: CORONARY ARTERY BYPASS GRAFTING (CABG) X4 LIMA-LAD; SEQ SVG-PD-PL; SVG-OM1 ENDOSCOPIC GREATER SAPHENOUS VEIN HARVEST(EVH) BILAT LOWER EXTREM;  Surgeon: Rexene Alberts, MD;  Location: [place];  Service: Open Heart Surgery;  Laterality: N/A;  . EP IMPLANTABLE DEVICE N/A 10/20/2015   Procedure: Pacemaker Implant;  Surgeon: Evans Lance, MD;  Location: [place] CV LAB;  Service: Cardiovascular;  Laterality: N/A;  . MITRAL VALVE REPAIR N/A 10/15/2015   Procedure: MITRAL VALVE REPAIR (MVR), # 28 MEMO 3-D RING ANNULOPLASTY AND COMPLEX VALVE REPAIR;  Surgeon: Rexene Alberts, MD;  Location: [place];  Service: Open Heart Surgery;  Laterality: N/A;  . TEE WITHOUT CARDIOVERSION N/A  10/15/2015   Procedure: TRANSESOPHAGEAL ECHOCARDIOGRAM (TEE);  Surgeon: Rexene Alberts, MD;  Location: [place];  Service: Open Heart Surgery;  Laterality: N/A;  . TEE WITHOUT CARDIOVERSION N/A 09/16/2017   Procedure: TRANSESOPHAGEAL ECHOCARDIOGRAM (TEE);  Surgeon: Dorothy Spark, MD;  Location: [facility] ENDOSCOPY;  Service: Cardiovascular;  Laterality: N/A;    Family History Family History  Problem Relation Age of Onset  . Other Father 48       Died from MI.  . Other Mother        alive & well  . Leukemia Brother 80       died     Social History  reports that he quit smoking about 32 years ago. His smoking use included cigarettes. He started smoking about 61 years ago. He has a 28.00 pack-year smoking history. He has never used smokeless tobacco. He reports that he drinks alcohol. He reports that he does not use drugs.  Medications  Current Outpatient Medications:  .  amLODipine (NORVASC) 10 MG tablet, Take 1 tablet (10 mg total) by mouth daily., Disp: 90 tablet, Rfl: 3 .  aspirin EC 81 MG tablet, Take 1 tablet (81 mg total) by mouth daily., Disp: 90 tablet, Rfl: 3 .  ASTAXANTHIN PO, Take 10 mg by mouth daily. , Disp: , Rfl:  .  atorvastatin (LIPITOR) 40 MG tablet, Take 40 mg by mouth daily. , Disp: , Rfl:  .  carvedilol (COREG) 6.25 MG tablet, TAKE 2 TABLETS TWICE DAILY, Disp: 360 tablet, Rfl: 3 .  Coenzyme Q10 200 MG capsule, Take 200 mg by mouth daily. , Disp: , Rfl:  .  furosemide (LASIX) 40 MG tablet, Take 1 tablet (40 mg total) by mouth 2 (two) times daily., Disp: 180 tablet, Rfl: 3 .  lisinopril (PRINIVIL,ZESTRIL) 20 MG tablet, Take 1 tablet (20 mg total) by mouth daily., Disp: 90 tablet, Rfl: 3 .  metFORMIN (GLUCOPHAGE) 500 MG tablet, Take 1,000 mg by mouth 2 (two) times daily with a meal. , Disp: , Rfl: 2 .  potassium chloride (KLOR-CON 10) 10 MEQ tablet, Take 1 tablet (10 mEq total) by mouth 2 (two) times daily., Disp: 180 tablet, Rfl: 3 .  umeclidinium-vilanterol (ANORO ELLIPTA)  62.5-25 MCG/INH AEPB, Inhale 1 puff into the lungs daily., Disp: 2 each, Rfl: 0 .  warfarin (COUMADIN) 5 MG tablet, Take 1 1/2 tablets daily except 1 tablet on Mondays and Fridays, Disp: 135 tablet, Rfl: 3  Allergies Patient has no known allergies.  Review of Systems Review of Systems - Oncology ROS negative other than extreme fatigue and weakness   Physical Exam  Vitals Wt Readings from Last 3 Encounters:  02/01/18 222 lb 3.2 oz (100.8 kg)  01/31/18 222 lb 3.2 oz (100.8 kg)  01/31/18 220 lb 12.8 oz (100.2 kg)   Temp Readings from Last 3 Encounters:  02/01/18 (!) 97.5 F (36.4 C) (Oral)  09/16/17 98.3 F (36.8 C) (Oral)  04/28/16 97.8 F (36.6 C) (Oral)   BP Readings from Last 3 Encounters:  02/01/18 117/87  01/31/18 128/70  01/31/18 114/66   Pulse Readings from Last 3 Encounters:  02/01/18 (!) 55  01/31/18 84  01/31/18 89    Constitutional: Well-developed, well-nourished, and in no distress.   HENT: Head: Normocephalic and atraumatic.  Mouth/Throat: No oropharyngeal exudate. Mucosa moist. Eyes: Pupils are equal, round, and reactive to light. Conjunctivae are normal. No scleral icterus.  Neck: Normal range of motion. Neck supple. No JVD present.  Cardiovascular: Normal rate, regular rhythm and normal heart sounds.  Exam reveals no gallop and no friction rub.   No murmur heard. Pulmonary/Chest: Effort normal.  Coarse BS.   Abdominal: Soft. Bowel sounds are normal. No distension. There is no tenderness. There is no guarding.  Musculoskeletal: Right leg larger than left.  Trace Edema.   Lymphadenopathy: No cervical, axillary or supraclavicular adenopathy.  Neurological: Alert and oriented to person, place, and time. No cranial nerve deficit.  Skin: Skin is warm and dry. No rash noted. No erythema. No pallor.  Psychiatric: Affect and judgment normal.   Labs Office Visit on 01/31/2018  Component Date Value Ref Range Status  . Date Time Interrogation Session  01/31/2018 [id number]   Final  . Pulse Generator Manufacturer 01/31/2018 BOST   Final  . Pulse Gen Model 01/31/2018 L311 ACCOLADE MRI   Final  . Pulse Gen Serial Number 01/31/2018 [id number]   Final  . Clinic Name 01/31/2018 [place]   Final  . Implantable Pulse Generator Type 01/31/2018 Implantable Pulse Generator   Final  . Implantable Pulse Generator Implan* 01/31/2018 [id number]   Final  . Implantable Lead Manufacturer 01/31/2018 BOST   Final  . Implantable Lead Model 01/31/2018 7741 [employer] MRI   Final  . Implantable Lead Serial Number 01/31/2018 [id number]   Final  . Implantable Lead Implant Date 01/31/2018 [id number]   Final  . Implantable Lead Location Detail 1 01/31/2018 UNKNOWN   Final  . Implantable Lead Location 01/31/2018 [id number]   Final  . Implantable Lead Manufacturer 01/31/2018 BOST   Final  . Implantable Lead Model 01/31/2018 7742 [employer]vity MRI   Final  . Implantable Lead Serial Number 01/31/2018 [id number]   Final  . Implantable Lead Implant Date 01/31/2018 [id number]   Final  . Implantable Lead Location Detail 1 01/31/2018 UNKNOWN   Final  . Implantable Lead Location 01/31/2018 [id number]   Final  . Lead Channel Setting Sensing Sensi* 01/31/2018 2.5  mV Final  . Lead Channel Setting Sensing Adapt* 01/31/2018 Fixed Pacing   Final  . Lead Channel Setting Pacing Amplit* 01/31/2018 2.0  V Final  . Lead Channel Setting Pacing Pulse * 01/31/2018 0.4  ms Final  . Lead Channel Setting Pacing Amplit* 01/31/2018 2.5  V Final  . Lead Channel Impedance Value 01/31/2018 698.0  ohm Final  . Lead Channel Sensing Intrinsic Amp* 01/31/2018 4.3  mV Final  . Lead Channel Pacing Threshold Ampl* 01/31/2018 1.0000  V Final  . Lead Channel Pacing Threshold Puls* 01/31/2018 0.4  ms Final  . Lead Channel Impedance Value 01/31/2018 637.0  ohm Final  . Lead Channel Sensing Intrinsic Amp* 01/31/2018 Paced   Final  . Lead Channel Pacing Threshold Ampl* 01/31/2018 0.8000  V Final  . Lead Channel Pacing  Threshold Puls*  01/31/2018 0.4  ms Final  . Battery Status 01/31/2018 BOS   Final  . Loletha Grayer Statistic RA Percent Paced 01/31/2018 1* % Final  . Loletha Grayer Statistic RV Percent Paced 01/31/2018 94  % Final  . Eval Rhythm 01/31/2018 As 80, Vp   Final     Pathology Orders Placed This Encounter  Procedures  . CT ANGIO CHEST PE W OR WO CONTRAST    Patient can leave after scan per provider    Standing Status:   Future    Number of Occurrences:   1    Standing Expiration Date:   05/04/2019    Order Specific Question:   ** REASON FOR EXAM (FREE TEXT)    Answer:   pulse ox 87% with ambulation.  Evaluate for PE    Order Specific Question:   If indicated for the ordered procedure, I authorize the administration of contrast media per Radiology protocol    Answer:   Yes    Order Specific Question:   Preferred imaging location?    Answer:   [facility]    Order Specific Question:   Call Results- Best Contact Number?    Answer:   [phone]    Order Specific Question:   Radiology Contrast Protocol - do NOT remove file path    Answer:   [email or web address]  . US Venous Img Lower Bilateral    Patient can leave after procedure per provider    Standing Status:   Future    Standing Expiration Date:   02/01/2019    Order Specific Question:   Reason for Exam (SYMPTOM  OR DIAGNOSIS REQUIRED)    Answer:   right leg swelling, Shortness of breat    Order Specific Question:   Preferred imaging location?    Answer:   [facility]    Order Specific Question:   Call Results- Best Contact Number?    Answer:   [phone]  . CBC with Differential/Platelet    Standing Status:   Future    Standing Expiration Date:   02/02/2019  . Comprehensive metabolic panel    Standing Status:   Future    Standing Expiration Date:   02/02/2019  . Lactate dehydrogenase    Standing Status:   Future    Standing Expiration Date:   02/02/2019  . Protein electrophoresis, serum    Standing  Status:   Future    Standing Expiration Date:   02/02/2019  . Ferritin    Standing Status:   Future    Standing Expiration Date:   02/02/2019  . Iron and TIBC    Standing Status:   Future    Standing Expiration Date:   02/02/2019  . Transferrin Saturation    Standing Status:   Future    Standing Expiration Date:   02/02/2019  . Haptoglobin    Standing Status:   Future    Standing Expiration Date:   02/02/2019  . Vitamin B12    Standing Status:   Future    Standing Expiration Date:   02/02/2019  . Folate    Standing Status:   Future    Standing Expiration Date:   02/02/2019  . Sedimentation rate    Standing Status:   Future    Standing Expiration Date:   02/02/2019  . C-reactive protein    Standing Status:   Future    Standing Expiration Date:   02/02/2019  . Rheumatoid factor    Standing Status:   Future  Standing Expiration Date:   02/02/2019       Zoila Shutter MD

## 2018-02-02 ENCOUNTER — Telehealth: Payer: Self-pay | Admitting: Pulmonary Disease

## 2018-02-02 ENCOUNTER — Ambulatory Visit (HOSPITAL_COMMUNITY)
Admission: RE | Admit: 2018-02-02 | Discharge: 2018-02-02 | Disposition: A | Discharge status: Home / Self Care | Payer: Medicare Other | Source: Ambulatory Visit | Attending: Internal Medicine | Admitting: Internal Medicine

## 2018-02-02 ENCOUNTER — Telehealth (HOSPITAL_COMMUNITY): Payer: Self-pay | Admitting: Internal Medicine

## 2018-02-02 ENCOUNTER — Inpatient Hospital Stay (HOSPITAL_COMMUNITY): Payer: Medicare Other

## 2018-02-02 ENCOUNTER — Encounter (HOSPITAL_COMMUNITY): Payer: Self-pay | Admitting: Internal Medicine

## 2018-02-02 ENCOUNTER — Ambulatory Visit (INDEPENDENT_AMBULATORY_CARE_PROVIDER_SITE_OTHER): Payer: Medicare Other | Admitting: *Deleted

## 2018-02-02 DIAGNOSIS — I10 Essential (primary) hypertension: Secondary | ICD-10-CM | POA: Diagnosis not present

## 2018-02-02 DIAGNOSIS — Z5181 Encounter for therapeutic drug level monitoring: Secondary | ICD-10-CM | POA: Diagnosis not present

## 2018-02-02 DIAGNOSIS — M7989 Other specified soft tissue disorders: Secondary | ICD-10-CM | POA: Diagnosis not present

## 2018-02-02 DIAGNOSIS — R600 Localized edema: Secondary | ICD-10-CM | POA: Diagnosis not present

## 2018-02-02 DIAGNOSIS — D509 Iron deficiency anemia, unspecified: Secondary | ICD-10-CM | POA: Diagnosis not present

## 2018-02-02 DIAGNOSIS — Z951 Presence of aortocoronary bypass graft: Secondary | ICD-10-CM | POA: Diagnosis not present

## 2018-02-02 DIAGNOSIS — D508 Other iron deficiency anemias: Secondary | ICD-10-CM

## 2018-02-02 DIAGNOSIS — Z87891 Personal history of nicotine dependence: Secondary | ICD-10-CM | POA: Diagnosis not present

## 2018-02-02 DIAGNOSIS — J90 Pleural effusion, not elsewhere classified: Secondary | ICD-10-CM | POA: Diagnosis not present

## 2018-02-02 DIAGNOSIS — I700 Atherosclerosis of aorta: Secondary | ICD-10-CM | POA: Diagnosis not present

## 2018-02-02 DIAGNOSIS — I4891 Unspecified atrial fibrillation: Secondary | ICD-10-CM

## 2018-02-02 DIAGNOSIS — R0602 Shortness of breath: Secondary | ICD-10-CM

## 2018-02-02 DIAGNOSIS — M2550 Pain in unspecified joint: Secondary | ICD-10-CM | POA: Diagnosis not present

## 2018-02-02 DIAGNOSIS — R5383 Other fatigue: Secondary | ICD-10-CM | POA: Diagnosis not present

## 2018-02-02 DIAGNOSIS — J439 Emphysema, unspecified: Secondary | ICD-10-CM | POA: Diagnosis not present

## 2018-02-02 DIAGNOSIS — Z7901 Long term (current) use of anticoagulants: Secondary | ICD-10-CM | POA: Diagnosis not present

## 2018-02-02 DIAGNOSIS — R918 Other nonspecific abnormal finding of lung field: Secondary | ICD-10-CM | POA: Diagnosis not present

## 2018-02-02 DIAGNOSIS — I25110 Atherosclerotic heart disease of native coronary artery with unstable angina pectoris: Secondary | ICD-10-CM | POA: Diagnosis not present

## 2018-02-02 HISTORY — DX: Iron deficiency anemia, unspecified: D50.9

## 2018-02-02 LAB — COMPREHENSIVE METABOLIC PANEL
ALK PHOS: 72 U/L (ref 38–126)
ALT: 13 U/L (ref 0–44)
ANION GAP: 7 (ref 5–15)
AST: 16 U/L (ref 15–41)
Albumin: 3.9 g/dL (ref 3.5–5.0)
BILIRUBIN TOTAL: 0.9 mg/dL (ref 0.3–1.2)
BUN: 23 mg/dL (ref 8–23)
CHLORIDE: 104 mmol/L (ref 98–111)
CO2: 28 mmol/L (ref 22–32)
Calcium: 9.6 mg/dL (ref 8.9–10.3)
Creatinine, Ser: 0.97 mg/dL (ref 0.61–1.24)
GFR calc Af Amer: >60 mL/min (ref >60)
GFR calc non Af Amer: >60 mL/min (ref >60)
Glucose, Bld: 102 mg/dL — ABNORMAL HIGH (ref 70–99)
POTASSIUM: 4.4 mmol/L (ref 3.5–5.1)
Sodium: 139 mmol/L (ref 135–145)
TOTAL PROTEIN: 7.5 g/dL (ref 6.5–8.1)

## 2018-02-02 LAB — CBC WITH DIFFERENTIAL/PLATELET
Basophils Absolute: 0 10*3/uL (ref 0.0–0.1)
Basophils Relative: 0 %
EOS ABS: 0.1 10*3/uL (ref 0.0–0.7)
Eosinophils Relative: 1 %
HEMATOCRIT: 36.5 % — AB (ref 39.0–52.0)
HEMOGLOBIN: 11.8 g/dL — AB (ref 13.0–17.0)
LYMPHS ABS: 0.8 10*3/uL (ref 0.7–4.0)
Lymphocytes Relative: 8 %
MCH: 29.2 pg (ref 26.0–34.0)
MCHC: 32.3 g/dL (ref 30.0–36.0)
MCV: 90.3 fL (ref 78.0–100.0)
MONO ABS: 0.7 10*3/uL (ref 0.1–1.0)
Monocytes Relative: 8 %
NEUTROS PCT: 83 %
Neutro Abs: 7.6 10*3/uL (ref 1.7–7.7)
Platelets: 302 10*3/uL (ref 150–400)
RBC: 4.04 MIL/uL — ABNORMAL LOW (ref 4.22–5.81)
RDW: 17.1 % — AB (ref 11.5–15.5)
WBC: 9.3 10*3/uL (ref 4.0–10.5)

## 2018-02-02 LAB — C-REACTIVE PROTEIN: CRP: 1.1 mg/dL — ABNORMAL HIGH (ref <1.0)

## 2018-02-02 LAB — FERRITIN: FERRITIN: 32 ng/mL (ref 24–336)

## 2018-02-02 LAB — IRON AND TIBC
IRON: 57 ug/dL (ref 45–182)
SATURATION RATIOS: 15 % — AB (ref 17.9–39.5)
TIBC: 373 ug/dL (ref 250–450)
UIBC: 316 ug/dL

## 2018-02-02 LAB — FOLATE: Folate: 24.9 ng/mL (ref >5.9)

## 2018-02-02 LAB — SEDIMENTATION RATE: Sed Rate: 37 mm/hr — ABNORMAL HIGH (ref 0–16)

## 2018-02-02 LAB — LACTATE DEHYDROGENASE: LDH: 170 U/L (ref 98–192)

## 2018-02-02 LAB — POCT INR: INR: 2 (ref 2.0–3.0)

## 2018-02-02 LAB — VITAMIN B12: Vitamin B-12: 366 pg/mL (ref 180–914)

## 2018-02-02 NOTE — Telephone Encounter (Signed)
I spoke to Dr. Walden Field; I think the best approach is for him to come to our office for ambulatory oxygen testing, a bnp, and assess for whether or not he needs to be on oxygen and have more diuretics.  The CT scan showed what looks like pulmonary edema.  The best way to accomplish this would be to have an office visit.

## 2018-02-02 NOTE — Progress Notes (Signed)
Reviewed, agree 

## 2018-02-02 NOTE — Patient Instructions (Signed)
Take coumadin extra 1/2 tablet today then increase dose to 1 1/2 tablets daily except 1 tablet on Mondays and Thursdays Recheck in 3 weeks

## 2018-02-02 NOTE — Telephone Encounter (Signed)
Patient requests to speak with BQ today if possible. CB is [phone].

## 2018-02-02 NOTE — Telephone Encounter (Signed)
I had Amy from the [facility] call and she said that Dr.Higgs and BQ talked and BQ want's to have his appt. Pushed up within 2 wk from know. I called the pt and he want's to talk to BQ before he changes his appt. He wanted to let him know that he has an appt. For an Infusion  on 9/27 and 10/14.  Pt want's to talk to BQ.

## 2018-02-02 NOTE — Telephone Encounter (Signed)
Called and spoke to pt.  Pt has been scheduled for OV with BQ on 02/03/18 at 9:45. BNP has been ordered.  Pt is aware and voiced his understanding.  Nothing further is needed.

## 2018-02-02 NOTE — Telephone Encounter (Signed)
Called and spoke to patient. Patient stated that he wants to make sure that Dr. Walden Field' plan of care is also what Dr. Lake Bells would want. Patient stated that he would like to make sure that Dr. Lake Bells is aware before he gets the iron infusion and before he changes his appointment.  Dr. Lake Bells please advise.

## 2018-02-02 NOTE — Telephone Encounter (Signed)
Spoke with pt regarding negative dopplers.  CTA negative for PE.  PT showed evidence of pleural effusions.  Pt iron level was low normal at 32.  HB 11.8.  He Will be given a trial of IV iron to determine if symptoms of fatigue improve.   I discussed scans with Dr. Lake Bells and oxygen level.  He will have pt RTC for follow-up. Pt had previous thoracentesis done in the past.

## 2018-02-03 ENCOUNTER — Encounter (HOSPITAL_COMMUNITY): Payer: Self-pay

## 2018-02-03 ENCOUNTER — Encounter: Payer: Self-pay | Admitting: Pulmonary Disease

## 2018-02-03 ENCOUNTER — Ambulatory Visit (INDEPENDENT_AMBULATORY_CARE_PROVIDER_SITE_OTHER): Payer: Medicare Other | Admitting: Pulmonary Disease

## 2018-02-03 ENCOUNTER — Inpatient Hospital Stay (HOSPITAL_COMMUNITY): Payer: Medicare Other

## 2018-02-03 VITALS — BP 126/80 | HR 61 | Ht 71.0 in | Wt 223.0 lb

## 2018-02-03 VITALS — BP 104/51 | HR 72 | Temp 98.4°F | Resp 18

## 2018-02-03 DIAGNOSIS — R0602 Shortness of breath: Secondary | ICD-10-CM | POA: Diagnosis not present

## 2018-02-03 DIAGNOSIS — G4733 Obstructive sleep apnea (adult) (pediatric): Secondary | ICD-10-CM

## 2018-02-03 DIAGNOSIS — D508 Other iron deficiency anemias: Secondary | ICD-10-CM

## 2018-02-03 DIAGNOSIS — D509 Iron deficiency anemia, unspecified: Secondary | ICD-10-CM | POA: Diagnosis not present

## 2018-02-03 DIAGNOSIS — R5383 Other fatigue: Secondary | ICD-10-CM | POA: Diagnosis not present

## 2018-02-03 DIAGNOSIS — J439 Emphysema, unspecified: Secondary | ICD-10-CM | POA: Diagnosis not present

## 2018-02-03 DIAGNOSIS — D649 Anemia, unspecified: Secondary | ICD-10-CM | POA: Diagnosis not present

## 2018-02-03 DIAGNOSIS — J432 Centrilobular emphysema: Secondary | ICD-10-CM | POA: Diagnosis not present

## 2018-02-03 DIAGNOSIS — J90 Pleural effusion, not elsewhere classified: Secondary | ICD-10-CM | POA: Diagnosis not present

## 2018-02-03 DIAGNOSIS — R918 Other nonspecific abnormal finding of lung field: Secondary | ICD-10-CM | POA: Diagnosis not present

## 2018-02-03 DIAGNOSIS — I700 Atherosclerosis of aorta: Secondary | ICD-10-CM | POA: Diagnosis not present

## 2018-02-03 DIAGNOSIS — M7989 Other specified soft tissue disorders: Secondary | ICD-10-CM | POA: Diagnosis not present

## 2018-02-03 LAB — HAPTOGLOBIN: HAPTOGLOBIN: 168 mg/dL (ref 34–200)

## 2018-02-03 LAB — RHEUMATOID FACTOR

## 2018-02-03 MED ORDER — SODIUM CHLORIDE 0.9 % IV SOLN
750.0000 mg | Freq: Once | INTRAVENOUS | Status: AC
  Administered 2018-02-03: 750 mg via INTRAVENOUS
  Filled 2018-02-03: qty 15

## 2018-02-03 MED ORDER — SODIUM CHLORIDE 0.9 % IV SOLN
Freq: Once | INTRAVENOUS | Status: AC
  Administered 2018-02-03: 14:00:00 via INTRAVENOUS

## 2018-02-03 NOTE — Progress Notes (Signed)
Subjective:   PATIENT ID: Christian Rasmussen GENDER: male DOB: 25-May-1940, MRN: [id number]  Synopsis: Referred in 07/2017 for dyspnea, he has a past medical history significant for systolic heart failure, coronary artery disease and mitral valve repair in 2017 and pacer placed.  He also has atrial fibrillation.  He smoked cigarettes until age 77.  He was found on a high-resolution CT scan in 2019 to have mild centrilobular emphysema and a left-sided pleural effusion which seems to have been present since his coronary artery bypass surgery in 2017.  HPI  Chief Complaint  Patient presents with  . Follow-up    Per telephone note, to discuss whether or not he needs to be on O2.    Christian Rasmussen has come back to see [place] because when he saw the hematology clinic 2 days ago his oxygen level dropped to 88% while walking.  He says that he has not had any new symptoms since he saw me several days ago.  In general he says his weight has been up to about 222 to 223 pounds for the last 3 to 4 months.  He does tell me today that he stopped taking the full dose of Lasix in the afternoon several months ago because it was making him urinate all night long.  He does still take Lasix 40 mg in the mornings and typically he will urinate within 1 hour of that dose.     Past Medical History:  Diagnosis Date  . 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 Aortic stenosis 10/14/2015  . CAD (coronary artery disease)    status post Taxus stent patency of RCA 2004, Cardiolite negative for ischemia in 2010.  . Diabetes mellitus ([place])   . Dyslipidemia   . History of kidney stones   . Hypertension   . Iron deficiency anemia 02/02/2018  . Mitral regurgitation 10/15/2015   Moderate-severe by intra-operative TEE  . Overweight   . Right bundle branch block (RBBB) with left anterior hemiblock   . 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 Snores   . Typical atrial flutter [facility])    s/p ablation 11-23-2012 by Dr Rayann Heman      Review of Systems  Constitutional: Positive for malaise/fatigue. Negative for chills and diaphoresis.  HENT: Negative for congestion, ear discharge and nosebleeds.   Respiratory: Positive for shortness of breath. Negative for cough and wheezing.   Cardiovascular: Negative for chest pain, claudication and leg swelling.      Objective:  Physical Exam   Vitals:   02/03/18 0946  BP: 126/80  Pulse: 61  SpO2: 96%  Weight: 223 lb (101.2 kg)  Height: 5\' 11"  (1.803 m)   RA  Walked 250 feet on room air, O2 saturation remained at 91% or above  Gen: well appearing HENT: OP clear, TM's clear, neck supple PULM: Crackles bases, rhonchi bases B, normal percussion CV: RRR, no mgr, trace edema GI: BS+, soft, nontender Derm: no cyanosis or rash, edema legs bilaterally Psyche: normal mood and affect    CBC    Component Value Date/Time   WBC 9.3 02/02/2018 0906   RBC 4.04 (L) 02/02/2018 0906   HGB  11.8 (L) 02/02/2018 0906   HGB 12.9 (L) 09/12/2017 0958   HCT 36.5 (L) 02/02/2018 0906   HCT 38.2 09/12/2017 0958   PLT 302 02/02/2018 0906   PLT 323 09/12/2017 0958   MCV 90.3 02/02/2018 0906   MCV 87 09/12/2017 0958   MCH 29.2 02/02/2018 0906   MCHC 32.3 02/02/2018 0906   RDW 17.1 (H) 02/02/2018 0906   RDW 16.1 (H) 09/12/2017 0958   LYMPHSABS 0.8 02/02/2018 0906   MONOABS 0.7 02/02/2018 0906   EOSABS 0.1 02/02/2018 0906   BASOSABS 0.0 02/02/2018 0906     Chest imaging: Feb 2019 chest imaging shows a pacemaker, cardiomegaly, chronic appearing left-sided pleural effusion April 2019 high-resolution CT chest images independently reviewed showing no evidence of  interstitial lung disease, some mild centrilobular emphysema, small pleural effusion noted on the left September 2019 CT angiogram chest images independently reviewed showing bilateral effusions, left greater than right, bilateral airspace disease consistent with pulmonary edema, mild centrilobular emphysema, no pulmonary embolism.  PFT: June 2017 PFT: Ratio 76%, FEV1 2.2 L 66% predicted, FVC 2.83 L 63% predicted, total lung capacity 5.4 L 74% predicted, DLCO 21.1 mL 62% predicted April 2019 ratio 73%, FVC 2.52 L 57% predicted, total lung capacity 4.4 L 60% predicted, DLCO 13.97 mL 41% predicted  Labs: May 2019 thoracentesis on the left LDH slightly elevated at 107, total protein 3.5, white blood cell count 673 with a lymphocyte predominance at 57%  Path:  Echo: Sep 16, 2017 trans-esophageal echocardiogram showed an LVEF of 50 to 55%, PA pressure estimate of 49 mmHg, moderate aortic regurgitation and moderate mitral stenosis   Heart Catheterization:   Records from multiple visits here reviewed where he was seen after a thoracentesis.     Assessment & Plan:   SOB (shortness of breath)  OSA (obstructive sleep apnea)  Anemia, unspecified type  Centrilobular emphysema (HCC)  Pleural effusion, bilateral  Discussion: Christian Rasmussen has many reasons to be short of breath, obesity, deconditioning, mild centrilobular emphysema, restrictive lung disease secondary to obesity and deconditioning, and now what appears to be diastolic heart failure.  His left-sided pleural effusion will likely never go away because of lung entrapment after surgery.  Fortunately we know that that is not malignant because of the studies we did earlier this year.  This week he had a CT scan of his chest which showed a new finding of acute pulmonary edema with enlarging bilateral effusions.  I believe that this is due to diastolic heart failure from the fact that he decreased his dose of Lasix in the afternoon.  I think to  some degree his mild anemia also contribute so I agree with plans for him to get IV iron.  I think with diuresis, IV iron, and slow attempts at exercise he will eventually get better.  Of note, his oxygen did not drop below 91% with walking today in our office and he does not want a prescription for oxygen.  Plan: Shortness of breath: This is a multifactorial problem Deconditioning contributes significantly so I would like for you to start exercising more regularly  Mild anemia: Iron deficiency Continue with plans for IV iron as directed in [place]  Acute diastolic heart failure: Start weighing yourself every day in the same outfit on the same scale I would like for you to lose 5 pounds in fluid over the next week Start taking full doses of Lasix in the morning and in the afternoon, typically 6 hours apart (40 mg each dose  Keep fluid intake less than 2 L a day Keep sodium intake less than 2 g a day I will notify cardiology about the findings from the CT scan of your chest  History of DVT/PE Continue taking warfarin  Emphysema: Because there was no benefit to Anoro I do not really see a reason for you to continue taking this.  Restrictive lung disease: This means that you cannot take a deep breath compared to other men of your age and height: I think this is a multifactorial problem, due to your weight, due to the fluid, and possibly due to diaphragm strength.  As stated on the last visit were going to make arrangements for a diaphragm sniff test to make sure your diaphragms are working well  We will plan on seeing you back in 2 weeks to see if you are feeling better with starting walking and using a higher dose of Lasix in the afternoon  Come back and see [place] sooner if needed  > 50% of this 25 min visit spent face to face    Current Outpatient Medications:  .  amLODipine (NORVASC) 10 MG tablet, Take 1 tablet (10 mg total) by mouth daily., Disp: 90 tablet, Rfl: 3 .  aspirin  EC 81 MG tablet, Take 1 tablet (81 mg total) by mouth daily., Disp: 90 tablet, Rfl: 3 .  ASTAXANTHIN PO, Take 10 mg by mouth daily. , Disp: , Rfl:  .  atorvastatin (LIPITOR) 40 MG tablet, Take 40 mg by mouth daily. , Disp: , Rfl:  .  carvedilol (COREG) 6.25 MG tablet, TAKE 2 TABLETS TWICE DAILY, Disp: 360 tablet, Rfl: 3 .  Coenzyme Q10 200 MG capsule, Take 200 mg by mouth daily. , Disp: , Rfl:  .  furosemide (LASIX) 40 MG tablet, Take 1 tablet (40 mg total) by mouth 2 (two) times daily., Disp: 180 tablet, Rfl: 3 .  lisinopril (PRINIVIL,ZESTRIL) 20 MG tablet, Take 1 tablet (20 mg total) by mouth daily., Disp: 90 tablet, Rfl: 3 .  metFORMIN (GLUCOPHAGE) 500 MG tablet, Take 1,000 mg by mouth 2 (two) times daily with a meal. , Disp: , Rfl: 2 .  potassium chloride (KLOR-CON 10) 10 MEQ tablet, Take 1 tablet (10 mEq total) by mouth 2 (two) times daily., Disp: 180 tablet, Rfl: 3 .  umeclidinium-vilanterol (ANORO ELLIPTA) 62.5-25 MCG/INH AEPB, Inhale 1 puff into the lungs daily., Disp: 2 each, Rfl: 0 .  warfarin (COUMADIN) 5 MG tablet, Take 1 1/2 tablets daily except 1 tablet on Mondays and Fridays, Disp: 135 tablet, Rfl: 3

## 2018-02-03 NOTE — Patient Instructions (Signed)
Shortness of breath: This is a multifactorial problem Deconditioning contributes significantly so I would like for you to start exercising more regularly  Mild anemia: Iron deficiency Continue with plans for IV iron as directed in [place]  Acute diastolic heart failure: Start weighing yourself every day in the same outfit on the same scale I would like for you to lose 5 pounds in fluid over the next week Start taking full doses of Lasix in the morning and in the afternoon, typically 6 hours apart (40 mg each dose Keep fluid intake less than 2 L a day Keep sodium intake less than 2 g a day I will notify cardiology about the findings from the CT scan of your chest  History of DVT/PE Continue taking warfarin  Emphysema: Because there was no benefit to Anoro I do not really see a reason for you to continue taking this.  Restrictive lung disease: This means that you cannot take a deep breath compared to other men of your age and height: I think this is a multifactorial problem, due to your weight, due to the fluid, and possibly due to diaphragm strength.  As stated on the last visit were going to make arrangements for a diaphragm sniff test to make sure your diaphragms are working well  We will plan on seeing you back in 2 weeks to see if you are feeling better with starting walking and using a higher dose of Lasix in the afternoon  Come back and see [place] sooner if needed

## 2018-02-03 NOTE — Patient Instructions (Signed)
[facility] at [facility] Discharge Instructions  Received Injectafer infusion today. Follow-up as scheduled. Call clinic for any questions or concerns   Thank you for choosing [facility] at [facility] to provide your oncology and hematology care.  To afford each patient quality time with our provider, please arrive at least 15 minutes before your scheduled appointment time.   If you have a lab appointment with the Cancer Center please come in thru the  Main Entrance and check in at the main information desk  You need to re-schedule your appointment should you arrive 10 or more minutes late.  We strive to give you quality time with our providers, and arriving late affects you and other patients whose appointments are after yours.  Also, if you no show three or more times for appointments you may be dismissed from the clinic at the providers discretion.     Again, thank you for choosing [facility].  Our hope is that these requests will decrease the amount of time that you wait before being seen by our physicians.       _____________________________________________________________  Should you have questions after your visit to [facility], please contact our office at [phone] between the hours of 8:00 a.m. and 4:30 p.m.  Voicemails left after 4:00 p.m. will not be returned until the following business day.  For prescription refill requests, have your pharmacy contact our office and allow 72 hours.    Cancer Center Support Programs:   > Cancer Support Group  2nd Tuesday of the month 1pm-2pm, Journey Room   

## 2018-02-03 NOTE — Progress Notes (Signed)
[name] tolerated Injectafer infusion well without complaints or incident. VSS upon discharge. Pt discharged self ambulatory in satisfactory condition accompanied by his wife

## 2018-02-06 LAB — PROTEIN ELECTROPHORESIS, SERUM
A/G RATIO SPE: 1.2 (ref 0.7–1.7)
Albumin ELP: 3.7 g/dL (ref 2.9–4.4)
Alpha-1-Globulin: 0.3 g/dL (ref 0.0–0.4)
Alpha-2-Globulin: 0.8 g/dL (ref 0.4–1.0)
BETA GLOBULIN: 1.2 g/dL (ref 0.7–1.3)
Gamma Globulin: 0.7 g/dL (ref 0.4–1.8)
Globulin, Total: 3 g/dL (ref 2.2–3.9)
Total Protein ELP: 6.7 g/dL (ref 6.0–8.5)

## 2018-02-08 ENCOUNTER — Ambulatory Visit (HOSPITAL_COMMUNITY)
Admission: RE | Admit: 2018-02-08 | Discharge: 2018-02-08 | Disposition: A | Discharge status: Home / Self Care | Payer: Medicare Other | Source: Ambulatory Visit | Attending: Pulmonary Disease | Admitting: Pulmonary Disease

## 2018-02-08 DIAGNOSIS — J432 Centrilobular emphysema: Secondary | ICD-10-CM | POA: Insufficient documentation

## 2018-02-08 LAB — PULMONARY FUNCTION TEST
DL/VA % PRED: 63 %
DL/VA: 2.93 ml/min/mmHg/L
DLCO unc % pred: 34 %
DLCO unc: 11.75 ml/min/mmHg
FEF 25-75 PRE: 1.4 L/s
FEF 25-75 Post: 1.62 L/sec
FEF2575-%CHANGE-POST: 15 %
FEF2575-%Pred-Post: 70 %
FEF2575-%Pred-Pre: 61 %
FEV1-%Change-Post: 3 %
FEV1-%PRED-PRE: 57 %
FEV1-%Pred-Post: 59 %
FEV1-POST: 1.89 L
FEV1-PRE: 1.83 L
FEV1FVC-%CHANGE-POST: 0 %
FEV1FVC-%Pred-Pre: 103 %
FEV6-%CHANGE-POST: 5 %
FEV6-%PRED-PRE: 57 %
FEV6-%Pred-Post: 60 %
FEV6-PRE: 2.36 L
FEV6-Post: 2.48 L
FEV6FVC-%Change-Post: 1 %
FEV6FVC-%PRED-PRE: 103 %
FEV6FVC-%Pred-Post: 105 %
FVC-%Change-Post: 3 %
FVC-%Pred-Post: 56 %
FVC-%Pred-Pre: 55 %
FVC-POST: 2.49 L
FVC-Pre: 2.42 L
POST FEV6/FVC RATIO: 99 %
PRE FEV6/FVC RATIO: 98 %
Post FEV1/FVC ratio: 76 %
Pre FEV1/FVC ratio: 76 %
RV % PRED: 79 %
RV: 2.09 L
TLC % PRED: 63 %
TLC: 4.59 L

## 2018-02-08 MED ORDER — ALBUTEROL SULFATE (2.5 MG/3ML) 0.083% IN NEBU
2.5000 mg | INHALATION_SOLUTION | Freq: Once | RESPIRATORY_TRACT | Status: AC
  Administered 2018-02-08: 2.5 mg via RESPIRATORY_TRACT

## 2018-02-09 ENCOUNTER — Telehealth (HOSPITAL_COMMUNITY): Payer: Self-pay

## 2018-02-09 NOTE — Telephone Encounter (Signed)
6 Spoke with pt regarding increased SOB he experienced 24 hrs after receiving his first dose of Injectafer last Friday. Pt does have H/O emphysema. He did not have any issues while the Injectafer was infusing. Discussed this information with Dr. Delton Coombes and he did not feel like the increase in SOB was related to the [facility] but the pt wanted to cancel this Fridays 2nd dose until he sees Dr. Walden Field next week on 10/10. This Fridays appt was cancelled and I will let Dr. Walden Field know when she returns next week

## 2018-02-10 ENCOUNTER — Other Ambulatory Visit: Payer: Self-pay

## 2018-02-10 ENCOUNTER — Ambulatory Visit (HOSPITAL_COMMUNITY): Payer: Medicare Other

## 2018-02-10 ENCOUNTER — Inpatient Hospital Stay (HOSPITAL_COMMUNITY): Payer: Medicare Other | Attending: Hematology

## 2018-02-10 VITALS — BP 111/45 | HR 82 | Temp 97.5°F | Resp 18

## 2018-02-10 DIAGNOSIS — D509 Iron deficiency anemia, unspecified: Secondary | ICD-10-CM | POA: Insufficient documentation

## 2018-02-10 DIAGNOSIS — D508 Other iron deficiency anemias: Secondary | ICD-10-CM

## 2018-02-10 MED ORDER — SODIUM CHLORIDE 0.9 % IV SOLN
750.0000 mg | Freq: Once | INTRAVENOUS | Status: AC
  Administered 2018-02-10: 750 mg via INTRAVENOUS
  Filled 2018-02-10: qty 15

## 2018-02-10 MED ORDER — SODIUM CHLORIDE 0.9 % IV SOLN
Freq: Once | INTRAVENOUS | Status: AC
  Administered 2018-02-10: 13:00:00 via INTRAVENOUS

## 2018-02-10 NOTE — Progress Notes (Signed)
Iron given today per orders.  Patient tolerated it well without problems. Vitals stable and discharged home from clinic ambulatory. Follow up as scheduled.  

## 2018-02-10 NOTE — Patient Instructions (Signed)
[place] at [facility] Discharge Instructions  Iron given  Follow up as scheduled.   Thank you for choosing [place] at [facility] to provide your oncology and hematology care.  To afford each patient quality time with our provider, please arrive at least 15 minutes before your scheduled appointment time.   If you have a lab appointment with the [place] please come in thru the  Main Entrance and check in at the main information desk  You need to re-schedule your appointment should you arrive 10 or more minutes late.  We strive to give you quality time with our providers, and arriving late affects you and other patients whose appointments are after yours.  Also, if you no show three or more times for appointments you may be dismissed from the clinic at the providers discretion.     Again, thank you for choosing [facility].  Our hope is that these requests will decrease the amount of time that you wait before being seen by our physicians.       _____________________________________________________________  Should you have questions after your visit to [facility], please contact our office at [phone] between the hours of 8:00 a.m. and 4:30 p.m.  Voicemails left after 4:00 p.m. will not be returned until the following business day.  For prescription refill requests, have your pharmacy contact our office and allow 72 hours.    Cancer Center Support Programs:   > Cancer Support Group  2nd Tuesday of the month 1pm-2pm, Journey Room

## 2018-02-15 ENCOUNTER — Encounter: Payer: Self-pay | Admitting: Pulmonary Disease

## 2018-02-15 ENCOUNTER — Other Ambulatory Visit (INDEPENDENT_AMBULATORY_CARE_PROVIDER_SITE_OTHER): Payer: Medicare Other

## 2018-02-15 ENCOUNTER — Ambulatory Visit (INDEPENDENT_AMBULATORY_CARE_PROVIDER_SITE_OTHER): Payer: Medicare Other | Admitting: Pulmonary Disease

## 2018-02-15 VITALS — BP 130/60 | HR 72 | Ht 71.0 in | Wt 219.2 lb

## 2018-02-15 DIAGNOSIS — J432 Centrilobular emphysema: Secondary | ICD-10-CM | POA: Diagnosis not present

## 2018-02-15 DIAGNOSIS — G4733 Obstructive sleep apnea (adult) (pediatric): Secondary | ICD-10-CM | POA: Diagnosis not present

## 2018-02-15 DIAGNOSIS — R0602 Shortness of breath: Secondary | ICD-10-CM

## 2018-02-15 DIAGNOSIS — I5031 Acute diastolic (congestive) heart failure: Secondary | ICD-10-CM | POA: Diagnosis not present

## 2018-02-15 DIAGNOSIS — J90 Pleural effusion, not elsewhere classified: Secondary | ICD-10-CM

## 2018-02-15 LAB — BASIC METABOLIC PANEL
BUN: 32 mg/dL — ABNORMAL HIGH (ref 6–23)
CALCIUM: 9.6 mg/dL (ref 8.4–10.5)
CO2: 29 mEq/L (ref 19–32)
CREATININE: 1.11 mg/dL (ref 0.40–1.50)
Chloride: 104 mEq/L (ref 96–112)
GFR: 68.58 mL/min (ref >60.00)
Glucose, Bld: 84 mg/dL (ref 70–99)
POTASSIUM: 3.9 meq/L (ref 3.5–5.1)
Sodium: 141 mEq/L (ref 135–145)

## 2018-02-15 LAB — BRAIN NATRIURETIC PEPTIDE: PRO B NATRI PEPTIDE: 431 pg/mL — AB (ref 0.0–100.0)

## 2018-02-15 NOTE — Patient Instructions (Signed)
Anemia: Continue iron infusions per the schedule from hematology  Chronic diastolic heart failure: Continue furosemide twice a day as you are doing We will check an electrolyte test today to make sure that your kidney function and electrolytes are okay Weigh yourself every day Keep fluid intake less than 2 L a day Keep sodium intake less than 2 g a day  Centrilobular emphysema: As stated previously the Anoro did not help so we will not restart this However, I think he would benefit from pulmonary rehab so I would like for you to enroll in that program  Dyspnea (shortness of breath): We will check your oxygen level while walking today  Bilateral pleural effusions: Based on physical exam today I have no evidence that these are worse We will check a chest x-ray when you return in 3 months  We will see you back in 3 months or sooner if needed

## 2018-02-15 NOTE — Progress Notes (Signed)
Subjective:   PATIENT ID: Christian Rasmussen GENDER: male DOB: Jun 30, 1940, MRN: [id number]  Synopsis: Referred in 07/2017 for dyspnea, he has a past medical history significant for systolic heart failure, coronary artery disease and mitral valve repair in 2017 and pacer placed.  He also has atrial fibrillation.  He smoked cigarettes until age 77.  He was found on a high-resolution CT scan in 2019 to have mild centrilobular emphysema and a left-sided pleural effusion which seems to have been present since his coronary artery bypass surgery in 2017.  HPI  Chief Complaint  Patient presents with  . Follow-up    doing a little better most days.  Feeling a little more SOB today.     Christian Rasmussen says that his breathing has been a little bit better since the last visit.  He has been taking the Lasix twice a day as we discussed and his weight is down some.  However today when he walked into our office he said that it was more difficult than what he has experienced in the last few weeks.  He did receive 2 injections of IV iron.   Past Medical History:  Diagnosis Date  . 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 Aortic stenosis 10/14/2015  . CAD (coronary artery disease)    status post Taxus stent patency of RCA 2004, Cardiolite negative for ischemia in 2010.  . Diabetes mellitus ([place])   . Dyslipidemia   . History of kidney stones   . Hypertension   . Iron deficiency anemia 02/02/2018  . Mitral regurgitation 10/15/2015   Moderate-severe by intra-operative TEE  . Overweight   . Right bundle branch block (RBBB) with left anterior hemiblock   . 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 S/P mitral valve repair 10/15/2015   Complex valvuloplasty including artificial Gore-tex neochord placement x6, decalcification of posterior annulus, autologous pericardial patch augmentation of posterior leaflet, and 28 mm Sorin Memo 3D ring  annuloplasty  . Snores   . Typical atrial flutter [facility])    s/p ablation 11-23-2012 by Christian Rasmussen      Review of Systems  Constitutional: Positive for malaise/fatigue. Negative for chills and diaphoresis.  HENT: Negative for congestion, ear discharge and nosebleeds.   Respiratory: Positive for shortness of breath. Negative for cough and wheezing.   Cardiovascular: Negative for chest pain, claudication and leg swelling.      Objective:  Physical Exam   Vitals:   02/15/18 1044 02/15/18 1048  BP: 130/60 130/60  Pulse: (!) 42 72  SpO2: 95% 95%  Weight:  219 lb 3.2 oz (99.4 kg)  Height:  5\' 11"  (1.803 m)   RA  Walked 250 feet on room air, O2 saturation remained at 91% or above  Gen: chronically ill appearing HENT: OP clear, TM's clear, neck supple PULM: CTA B, normal percussion CV: RRR, no mgr, trace edema GI: BS+, soft, nontender Derm: edema legs R>L, no cyanosis Psyche: normal mood and affect    CBC    Component Value Date/Time   WBC 9.3 02/02/2018 0906   RBC 4.04 (L) 02/02/2018 0906   HGB 11.8 (L) 02/02/2018 0906   HGB 12.9 (L) 09/12/2017 0958   HCT 36.5 (L) 02/02/2018 0906   HCT 38.2 09/12/2017 0958   PLT 302 02/02/2018 0906   PLT 323 09/12/2017 0958   MCV 90.3 02/02/2018 0906  MCV 87 09/12/2017 0958   MCH 29.2 02/02/2018 0906   MCHC 32.3 02/02/2018 0906   RDW 17.1 (H) 02/02/2018 0906   RDW 16.1 (H) 09/12/2017 0958   LYMPHSABS 0.8 02/02/2018 0906   MONOABS 0.7 02/02/2018 0906   EOSABS 0.1 02/02/2018 0906   BASOSABS 0.0 02/02/2018 0906     Chest imaging: Feb 2019 chest imaging shows a pacemaker, cardiomegaly, chronic appearing left-sided pleural effusion April 2019 high-resolution CT chest images independently reviewed showing no evidence of interstitial lung disease, some mild centrilobular emphysema, small pleural effusion noted on the left September 2019 CT angiogram chest images independently reviewed showing bilateral effusions, left greater than  right, bilateral airspace disease consistent with pulmonary edema, mild centrilobular emphysema, no pulmonary embolism.  PFT: June 2017 PFT: Ratio 76%, FEV1 2.2 L 66% predicted, FVC 2.83 L 63% predicted, total lung capacity 5.4 L 74% predicted, DLCO 21.1 mL 62% predicted April 2019 ratio 73%, FVC 2.52 L 57% predicted, total lung capacity 4.4 L 60% predicted, DLCO 13.97 mL 41% predicted September 2019 ratio 76%, FVC 2.49 L 56% predicted, total lung capacity 4.6 L 63% predicted, DLCO 11.75 mL 34% predicted  Labs: May 2019 thoracentesis on the left LDH slightly elevated at 107, total protein 3.5, white blood cell count 673 with a lymphocyte predominance at 57%  Path:  Echo: Sep 16, 2017 trans-esophageal echocardiogram showed an LVEF of 50 to 55%, PA pressure estimate of 49 mmHg, moderate aortic regurgitation and moderate mitral stenosis   Heart Catheterization:      Assessment & Plan:   Acute diastolic CHF (congestive heart failure) ([place]) - Plan: Basic Metabolic Panel (BMET)  SOB (shortness of breath)  OSA (obstructive sleep apnea)  Centrilobular emphysema (HCC)  Pleural effusion, bilateral  Discussion: Christian Rasmussen is slightly better now that we have him diuresing some with an increased dose of Lasix.  He still has volume overload on physical exam today so I think the best approach will be to get his weight down around to 15 or perhaps slightly lower.  We need to check blood work today to make sure that his kidney function and electrolytes are okay with his increased dose of Lasix.  As stated previously he has centrilobular emphysema, bilateral pleural effusions (mostly transdutative, though the one on the left is due to trapped lung) and physical deconditioning all of which cause his shortness of breath.  His diastolic heart failure is also contributing.  Anemia also contributes.  Plan: Anemia: Continue iron infusions per the schedule from hematology  Chronic diastolic heart  failure: Continue furosemide twice a day as you are doing We will check an electrolyte test today to make sure that your kidney function and electrolytes are okay Weigh yourself every day Keep fluid intake less than 2 L a day Keep sodium intake less than 2 g a day  Centrilobular emphysema: As stated previously the Anoro did not help so we will not restart this However, I think he would benefit from pulmonary rehab so I would like for you to enroll in that program  Dyspnea (shortness of breath): We will check your oxygen level while walking today  Bilateral pleural effusions: Based on physical exam today I have no evidence that these are worse We will check a chest x-ray when you return in 3 months  We will see you back in 3 months or sooner if needed    Current Outpatient Medications:  .  amLODipine (NORVASC) 10 MG tablet, Take 1 tablet (10 mg total)  by mouth daily., Disp: 90 tablet, Rfl: 3 .  aspirin EC 81 MG tablet, Take 1 tablet (81 mg total) by mouth daily., Disp: 90 tablet, Rfl: 3 .  ASTAXANTHIN PO, Take 10 mg by mouth daily. , Disp: , Rfl:  .  atorvastatin (LIPITOR) 40 MG tablet, Take 40 mg by mouth daily. , Disp: , Rfl:  .  carvedilol (COREG) 6.25 MG tablet, TAKE 2 TABLETS TWICE DAILY, Disp: 360 tablet, Rfl: 3 .  Coenzyme Q10 200 MG capsule, Take 200 mg by mouth daily. , Disp: , Rfl:  .  furosemide (LASIX) 40 MG tablet, Take 1 tablet (40 mg total) by mouth 2 (two) times daily., Disp: 180 tablet, Rfl: 3 .  lisinopril (PRINIVIL,ZESTRIL) 20 MG tablet, Take 1 tablet (20 mg total) by mouth daily., Disp: 90 tablet, Rfl: 3 .  metFORMIN (GLUCOPHAGE) 500 MG tablet, Take 1,000 mg by mouth 2 (two) times daily with a meal. , Disp: , Rfl: 2 .  potassium chloride (KLOR-CON 10) 10 MEQ tablet, Take 1 tablet (10 mEq total) by mouth 2 (two) times daily. (Patient taking differently: Take 10 mEq by mouth once. ), Disp: 180 tablet, Rfl: 3 .  warfarin (COUMADIN) 5 MG tablet, Take 1 1/2 tablets  daily except 1 tablet on Mondays and Fridays, Disp: 135 tablet, Rfl: 3 .  umeclidinium-vilanterol (ANORO ELLIPTA) 62.5-25 MCG/INH AEPB, Inhale 1 puff into the lungs daily. (Patient not taking: Reported on 02/15/2018), Disp: 2 each, Rfl: 0

## 2018-02-16 ENCOUNTER — Other Ambulatory Visit: Payer: Self-pay

## 2018-02-16 ENCOUNTER — Inpatient Hospital Stay (HOSPITAL_BASED_OUTPATIENT_CLINIC_OR_DEPARTMENT_OTHER): Payer: Medicare Other | Admitting: Internal Medicine

## 2018-02-16 ENCOUNTER — Encounter (HOSPITAL_COMMUNITY): Payer: Self-pay | Admitting: Internal Medicine

## 2018-02-16 VITALS — BP 123/66 | HR 90 | Temp 97.3°F | Resp 16 | Wt 220.0 lb

## 2018-02-16 DIAGNOSIS — D509 Iron deficiency anemia, unspecified: Secondary | ICD-10-CM

## 2018-02-16 DIAGNOSIS — D508 Other iron deficiency anemias: Secondary | ICD-10-CM

## 2018-02-16 NOTE — Progress Notes (Signed)
Diagnosis Other iron deficiency anemia - Plan: CBC with Differential/Platelet, Comprehensive metabolic panel, Lactate dehydrogenase, Ferritin  Staging Cancer Staging No matching staging information was found for the patient.  Assessment and Plan:  1.  Iron deficiency Anemia.  Labs that were done 01/18/2018 showed a white count of 11.8 hemoglobin 12.2 MCV was 86 platelets 398,000.  His creatinine was 1 and potassium was 4.3.   Labs done 02/02/2018 reviewed and showed WBC 9.3 HB 11.8 and plts 302,000.  Chemistries WNL with K+ 4.4, Cr 0.97 and normal LFTs   Ferritin was 32.  Due to complaints of fatigue, pt was given a trial of IV iron.  He was last treated on 02/10/2018.  Pt will RTC 04/2018 for follow-up and repeat labs.   He has an normal haptoglobin, B12, folate and SPEP.  Reticulocytosis nonspecific finding.    2.  Fatigue.  Pt had CTA done 02/01/2018 that showed no PE but pt had left and right Pleural effusions.  I discussed case with Dr. Lake Bells.  Pt should continue to follow-up with Pulmonary as directed.  He remains on coumadin.  Pt has been recommended for rehab by Dr. Lake Bells.    3.  Right Leg swelling.  This is likely related to his CABG surgery.  Due to decreased oxygenation with ambulation he will undergo bilateral lower extremity Dopplers for further evaluation.  Dopplers done 02/02/2018 negative for DVT.    4.  Pulmonary nodules.  Patient had a CT of the chest done April 2019 that was reviewed and showed  Impression: 1. No evidence of interstitial lung disease. 2. Scattered small solid pulmonary nodules in the right lung, largest 5 mm. No follow-up needed if patient is low-risk (and has no known or suspected primary neoplasm). Non-contrast chest CT can be considered in 12 months if patient is high-risk. This recommendation follows the consensus statement: Guidelines for Management of Incidental Pulmonary Nodules Detected on CT Images:From the Fleischner Society 2017; published  online before print (10.1148/radiol.[id number]). 3. Trace right and small left dependent pleural effusions. 4. Dilated main pulmonary artery, suggesting pulmonary arterial hypertension. 5. Mild interlobular septal thickening throughout the lungs, suggesting mild pulmonary edema.  Aortic Atherosclerosis (ICD10-I70.0) and Emphysema (ICD10-J43.9).  Pt had a stable 4 mm right Pulmonary nodule.  He will be set up for repeat imaging in 01/2019.    5.  CAD.  Follow-up with Cardiology as directed.    6.  HTN.  BP is 123/66.   Follow-up with PCP.    7.  Joint pain.  CRP, RF, Sed rate, SPEP all WNL.    25  minutes spent with more than 50% spent in counseling and coordination of care.    Interval history.  Historical data obtained from noted dated 02/01/2018:  77 year old male referred for evaluation of anemia.  He has a history of coronary artery disease and has undergone CABG in the past.  He also has sleep apnea.  He has been seen recently by pulmonary.  He reports difficulty with ambulation and has extreme fatigue.  Labs done 01/18/2018 reviewed and showed white count 11.8 hemoglobin 12.2 platelets 398,000.  He had a normal differential.  Potassium was normal at 4.3 creatinine was 1.  Patient smoked in the past reportedly 2 packs/day for 15 years but has stopped smoking for more than 30 years.  He reports he had fluid on his lung and had some drained around the time he had his cardiac surgery.  He denies any blood in his stool or  his urine.  He has never been told he had thyroid problems.  Patient is also reporting some swelling of his right leg.  This is the leg that the graft was taken from for his surgery.  He reports occasional joint symptoms.  He reports he takes Lasix.   Current Status:  Pt is seen today for follow-up after IV iron.  He was recently seen by Dr. Lake Bells.  He reports ongoing problems with SOB.    Problem List Patient Active Problem List   Diagnosis Date Noted  . Iron  deficiency anemia [D50.9] 02/02/2018  . OSA (obstructive sleep apnea) [G47.33] 11/22/2017  . Aortic valve disease [I35.9]   . Dyspnea [R06.00] 08/23/2017  . Lung nodules [R91.8] 08/23/2017  . Hypoxia [R09.02]   . Pleural effusion, bilateral [J90]   . Acute diastolic CHF (congestive heart failure) ([place]) [I50.31] 03/19/2016  . Edema extremities [R60.0] 12/24/2015  . 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 Mitral valve disease [I05.9] 10/15/2015  . Aortic insufficiency [I35.1] 10/15/2015  . Aortic stenosis [I35.0] 10/14/2015  . NSTEMI (non-ST elevated myocardial infarction) ([place]) [I21.4] 10/12/2015  . Unstable angina pectoris ([place]) [I20.0] 10/12/2015  . Mild aortic stenosis [I35.0] 04/13/2013  . Atrial flutter ([place]) [I48.92] 11/09/2012  . Snoring [R06.83] 11/09/2012  . 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 Hypertension [I10]   . CAD (coronary artery disease) [I25.10]   . DYSLIPIDEMIA [E78.5] 04/11/2008  . CORONARY ATHEROSCLEROSIS NATIVE CORONARY ARTERY [I25.10] 04/11/2008  . PERCUTANEOUS TRANSLUMINAL CORONARY ANGIOPLASTY, HX OF [Z98.61] 04/11/2008    Past Medical History Past Medical History:  Diagnosis Date  . 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 Aortic stenosis 10/14/2015  . CAD (coronary artery disease)    status post Taxus stent patency of RCA 2004, Cardiolite negative for ischemia in 2010.  . Diabetes mellitus ([place])   . Dyslipidemia   . History of kidney stones   . Hypertension   . Iron deficiency anemia 02/02/2018  . Mitral regurgitation 10/15/2015   Moderate-severe by intra-operative TEE  . Overweight   . Right bundle branch block (RBBB) with left anterior hemiblock   . 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 Snores   . Typical atrial flutter [facility])    s/p ablation 11-23-2012 by Dr Rayann Heman    Past Surgical History Past Surgical History:  Procedure Laterality Date  . ABLATION  11-23-2012   s/p cavotricuspid isthmus ablation by Dr Rayann Heman  . ANKLE SURGERY Left    total ankle replacement  . ATRIAL FLUTTER ABLATION N/A 11/23/2012   Procedure: ATRIAL FLUTTER ABLATION;  Surgeon: Thompson Grayer, MD;  Location: [facility] CATH LAB;  Service: Cardiovascular;  Laterality: N/A;  . CARDIAC CATHETERIZATION N/A 10/13/2015   Procedure: Left Heart Cath and Coronary Angiography;  Surgeon: Peter M Martinique, MD;  Location: [place] CV LAB;  Service: Cardiovascular;  Laterality: N/A;  . CATARACT EXTRACTION Right   . CATARACT EXTRACTION W/PHACO Left 09/13/2013   Procedure: CATARACT EXTRACTION PHACO AND INTRAOCULAR LENS PLACEMENT (IOC);  Surgeon: Tonny Branch, MD;  Location: [facility] ORS;  Service: Ophthalmology;  Laterality: Left;  CDE 9.38  . COLONOSCOPY N/A 10/10/2014   Procedure: COLONOSCOPY;  Surgeon: Rogene Houston, MD;  Location: [facility] ENDO SUITE;  Service: Endoscopy;  Laterality: N/A;  830  . CORONARY ANGIOPLASTY     3 stents  . CORONARY ARTERY BYPASS GRAFT N/A 10/15/2015   Procedure: CORONARY ARTERY BYPASS GRAFTING (CABG) X4 LIMA-LAD; SEQ SVG-PD-PL; SVG-OM1 ENDOSCOPIC GREATER SAPHENOUS VEIN HARVEST(EVH) BILAT LOWER EXTREM;  Surgeon: Rexene Alberts, MD;  Location: [place];  Service: Open Heart Surgery;  Laterality: N/A;  . EP IMPLANTABLE DEVICE N/A 10/20/2015   Procedure: Pacemaker Implant;  Surgeon: Evans Lance, MD;  Location: [place] CV LAB;  Service: Cardiovascular;  Laterality: N/A;  . MITRAL VALVE REPAIR N/A 10/15/2015   Procedure: MITRAL VALVE REPAIR (MVR), # 28 MEMO 3-D RING ANNULOPLASTY AND COMPLEX VALVE REPAIR;  Surgeon: Rexene Alberts, MD;  Location: [place];  Service:  Open Heart Surgery;  Laterality: N/A;  . TEE WITHOUT CARDIOVERSION N/A 10/15/2015   Procedure: TRANSESOPHAGEAL ECHOCARDIOGRAM (TEE);  Surgeon: Rexene Alberts, MD;  Location: [place];  Service: Open Heart Surgery;  Laterality: N/A;  . TEE WITHOUT CARDIOVERSION N/A 09/16/2017   Procedure: TRANSESOPHAGEAL ECHOCARDIOGRAM (TEE);  Surgeon: Dorothy Spark, MD;  [facility] ENDOSCOPY;  Service: Cardiovascular;  Laterality: N/A;    Family History Family History  Problem Relation Age of Onset  . Other Father 26       Died from MI.  . Other Mother        alive & well  . Leukemia Brother 26       died     Social History  reports that he quit smoking about 32 years ago. His smoking use included cigarettes. He started smoking about 61 years ago. He has a 28.00 pack-year smoking history. He has never used smokeless tobacco. He reports that he drinks alcohol. He reports that he does not use drugs.  Medications  Current Outpatient Medications:  .  amLODipine (NORVASC) 10 MG tablet, Take 1 tablet (10 mg total) by mouth daily., Disp: 90 tablet, Rfl: 3 .  aspirin EC 81 MG tablet, Take 1 tablet (81 mg total) by mouth daily., Disp: 90 tablet, Rfl: 3 .  ASTAXANTHIN PO, Take 10 mg by mouth daily. , Disp: , Rfl:  .  atorvastatin (LIPITOR) 40 MG tablet, Take 40 mg by mouth daily. , Disp: , Rfl:  .  carvedilol (COREG) 6.25 MG tablet, TAKE 2 TABLETS TWICE DAILY, Disp: 360 tablet, Rfl: 3 .  Coenzyme Q10 200 MG capsule, Take 200 mg by mouth daily. , Disp: , Rfl:  .  furosemide (LASIX) 40 MG tablet, Take 1 tablet (40 mg total) by mouth 2 (two) times daily., Disp: 180 tablet, Rfl: 3 .  lisinopril (PRINIVIL,ZESTRIL) 20 MG tablet, Take 1 tablet (20 mg total) by mouth daily., Disp: 90 tablet, Rfl: 3 .  metFORMIN (GLUCOPHAGE) 500 MG tablet, Take 1,000 mg by mouth 2 (two) times daily with a meal. , Disp: , Rfl: 2 .  potassium chloride (KLOR-CON 10) 10 MEQ tablet, Take 1 tablet (10 mEq total) by mouth 2 (two) times  daily. (Patient taking differently: Take 10 mEq by mouth once. ), Disp: 180 tablet, Rfl: 3 .  umeclidinium-vilanterol (ANORO ELLIPTA) 62.5-25 MCG/INH AEPB, Inhale 1 puff into the lungs daily., Disp: 2 each, Rfl: 0 .  warfarin (COUMADIN) 5 MG tablet, Take 1 1/2 tablets daily except 1 tablet on Mondays and Fridays, Disp: 135 tablet, Rfl: 3  Allergies  Patient has no known allergies.  Review of Systems Review of Systems - Oncology ROS negative other than fatigue   Physical Exam  Vitals Wt Readings from Last 3 Encounters:  02/16/18 220 lb (99.8 kg)  02/15/18 219 lb 3.2 oz (99.4 kg)  02/03/18 223 lb (101.2 kg)   Temp Readings from Last 3 Encounters:  02/16/18 (!) 97.3 F (36.3 C) (Oral)  02/10/18 (!) 97.5 F (36.4 C) (Oral)  02/03/18 98.4 F (36.9 C) (Oral)   BP Readings from Last 3 Encounters:  02/16/18 123/66  02/15/18 130/60  02/10/18 (!) 111/45   Pulse Readings from Last 3 Encounters:  02/16/18 90  02/15/18 72  02/10/18 82    Constitutional: Well-developed, well-nourished, and in no distress.   HENT: Head: Normocephalic and atraumatic.  Mouth/Throat: No oropharyngeal exudate. Mucosa moist. Eyes: Pupils are equal, round, and reactive to light. Conjunctivae are normal. No scleral icterus.  Neck: Normal range of motion. Neck supple. No JVD present.  Cardiovascular: Normal rate, regular rhythm and normal heart sounds.  Exam reveals no gallop and no friction rub.   No murmur heard. Pulmonary/Chest: Effort normal and breath sounds normal. No respiratory distress. No wheezes.No rales.  Abdominal: Soft. Bowel sounds are normal. No distension. There is no tenderness. There is no guarding.  Musculoskeletal: right LE edema unchanged.   Lymphadenopathy: No cervical, axillary or supraclavicular adenopathy.  Neurological: Alert and oriented to person, place, and time. No cranial nerve deficit.  Skin: Skin is warm and dry. No rash noted. No erythema. No pallor.  Psychiatric: Affect  and judgment normal.   Labs Appointment on 02/15/2018  Component Date Value Ref Range Status  . Sodium 02/15/2018 141  135 - 145 mEq/L Final  . Potassium 02/15/2018 3.9  3.5 - 5.1 mEq/L Final  . Chloride 02/15/2018 104  96 - 112 mEq/L Final  . CO2 02/15/2018 29  19 - 32 mEq/L Final  . Glucose, Bld 02/15/2018 84  70 - 99 mg/dL Final  . BUN 02/15/2018 32* 6 - 23 mg/dL Final  . Creatinine, Ser 02/15/2018 1.11  0.40 - 1.50 mg/dL Final  . Calcium 02/15/2018 9.6  8.4 - 10.5 mg/dL Final  . GFR 02/15/2018 68.58  >60.00 mL/min Final  . Pro B Natriuretic peptide (BNP) 02/15/2018 431.0* 0.0 - 100.0 pg/mL Final     Pathology Orders Placed This Encounter  Procedures  . CBC with Differential/Platelet    Standing Status:   Future    Standing Expiration Date:   02/17/2019  . Comprehensive metabolic panel    Standing Status:   Future    Standing Expiration Date:   02/17/2019  . Lactate dehydrogenase    Standing Status:   Future    Standing Expiration Date:   02/17/2019  . Ferritin    Standing Status:   Future    Standing Expiration Date:   02/17/2019       Zoila Shutter MD

## 2018-02-17 LAB — CUP PACEART REMOTE DEVICE CHECK
Implantable Lead Implant Date: 20170612
Implantable Lead Location: 753859
Implantable Lead Model: 7741
Implantable Lead Model: 7742
Implantable Pulse Generator Implant Date: 20170612
MDC IDC LEAD IMPLANT DT: 20170612
MDC IDC LEAD LOCATION: 753860
MDC IDC LEAD SERIAL: 733074
MDC IDC LEAD SERIAL: 760165
MDC IDC PG SERIAL: 724121
MDC IDC SESS DTM: 20191011132039

## 2018-02-23 ENCOUNTER — Ambulatory Visit (INDEPENDENT_AMBULATORY_CARE_PROVIDER_SITE_OTHER): Payer: Medicare Other | Admitting: *Deleted

## 2018-02-23 DIAGNOSIS — I4891 Unspecified atrial fibrillation: Secondary | ICD-10-CM | POA: Diagnosis not present

## 2018-02-23 DIAGNOSIS — Z5181 Encounter for therapeutic drug level monitoring: Secondary | ICD-10-CM | POA: Diagnosis not present

## 2018-02-23 LAB — POCT INR: INR: 2.3 (ref 2.0–3.0)

## 2018-02-23 NOTE — Patient Instructions (Signed)
Continue coumadin 1 1/2 tablets daily except 1 tablet on Mondays and Thursdays Recheck in 3 weeks

## 2018-03-13 DIAGNOSIS — I509 Heart failure, unspecified: Secondary | ICD-10-CM | POA: Diagnosis not present

## 2018-03-13 DIAGNOSIS — I471 Supraventricular tachycardia: Secondary | ICD-10-CM | POA: Diagnosis not present

## 2018-03-13 DIAGNOSIS — E1165 Type 2 diabetes mellitus with hyperglycemia: Secondary | ICD-10-CM | POA: Diagnosis not present

## 2018-03-13 DIAGNOSIS — Z299 Encounter for prophylactic measures, unspecified: Secondary | ICD-10-CM | POA: Diagnosis not present

## 2018-03-13 DIAGNOSIS — E1151 Type 2 diabetes mellitus with diabetic peripheral angiopathy without gangrene: Secondary | ICD-10-CM | POA: Diagnosis not present

## 2018-03-13 DIAGNOSIS — Z6831 Body mass index (BMI) 31.0-31.9, adult: Secondary | ICD-10-CM | POA: Diagnosis not present

## 2018-03-13 DIAGNOSIS — I10 Essential (primary) hypertension: Secondary | ICD-10-CM | POA: Diagnosis not present

## 2018-03-16 ENCOUNTER — Ambulatory Visit (INDEPENDENT_AMBULATORY_CARE_PROVIDER_SITE_OTHER): Payer: Medicare Other | Admitting: *Deleted

## 2018-03-16 DIAGNOSIS — I4891 Unspecified atrial fibrillation: Secondary | ICD-10-CM | POA: Diagnosis not present

## 2018-03-16 DIAGNOSIS — Z5181 Encounter for therapeutic drug level monitoring: Secondary | ICD-10-CM

## 2018-03-16 LAB — POCT INR: INR: 3.2 — AB (ref 2.0–3.0)

## 2018-03-16 IMAGING — CR DG CHEST 1V PORT
1 series · 1 of 1 positions shown · non-contrast
Comparison: [REDACTED] 6138

CLINICAL DATA: Postop check.  Evaluate chest tube.

EXAM:
PORTABLE CHEST 1 VIEW

[AP]
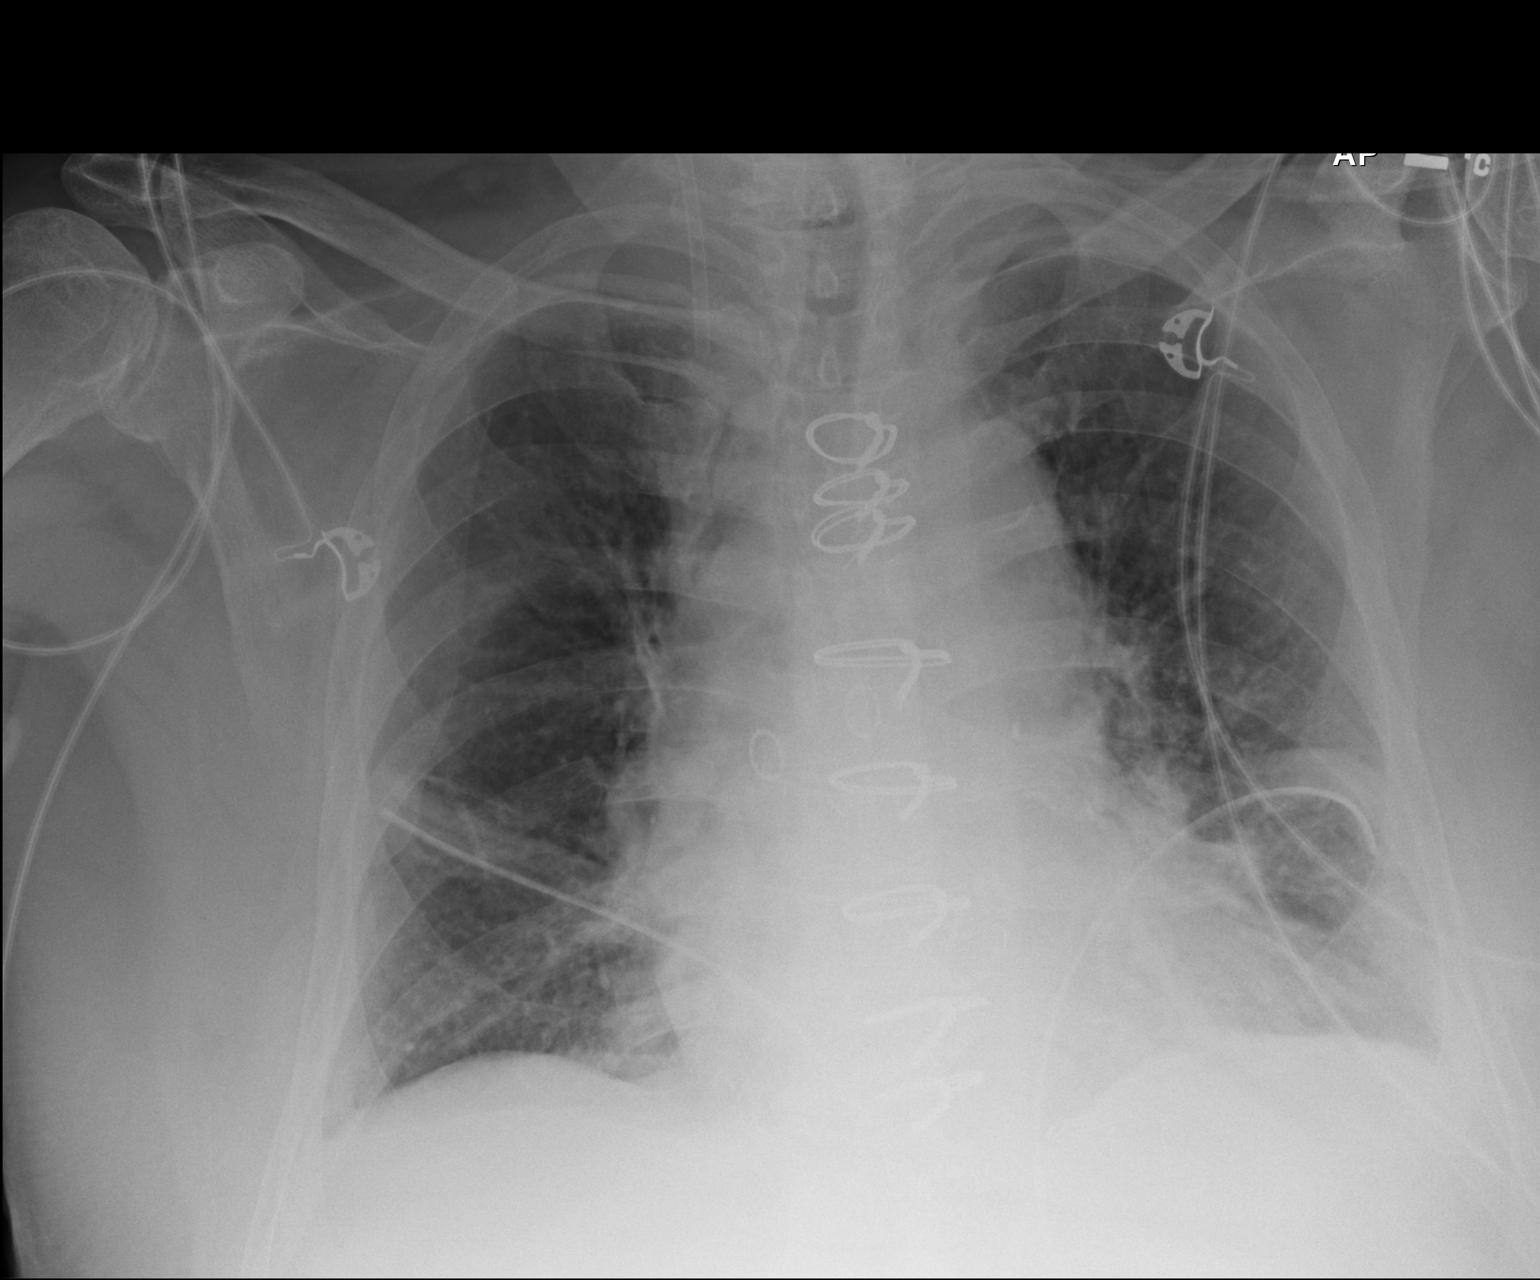

[1 of 1 positions shown; findings below may reference images not displayed]

FINDINGS: The mediastinal drain has been removed. A right chest tube and right
IJ sheath remain. No pneumothorax. The cardiomediastinal silhouette
is stable. Left chest tube remains as well with mild atelectasis in
the left base. No other changes.
IMPRESSION: The mediastinal drain has been removed. Other support apparatus is
stable. No pneumothorax. Mild pulmonary venous congestion and left
basilar atelectasis.

## 2018-03-16 NOTE — Patient Instructions (Signed)
Hold coumadin tonight then resume 1 1/2 tablets daily except 1 tablet on Mondays and Thursdays Recheck in 2 weeks

## 2018-03-17 IMAGING — DX DG CHEST 1V PORT
1 series · 1 of 1 positions shown · non-contrast
Comparison: One day prior

CLINICAL DATA: Status post CABG x3.  Lower right back pain.

EXAM:
PORTABLE CHEST 1 VIEW

[chest ap]
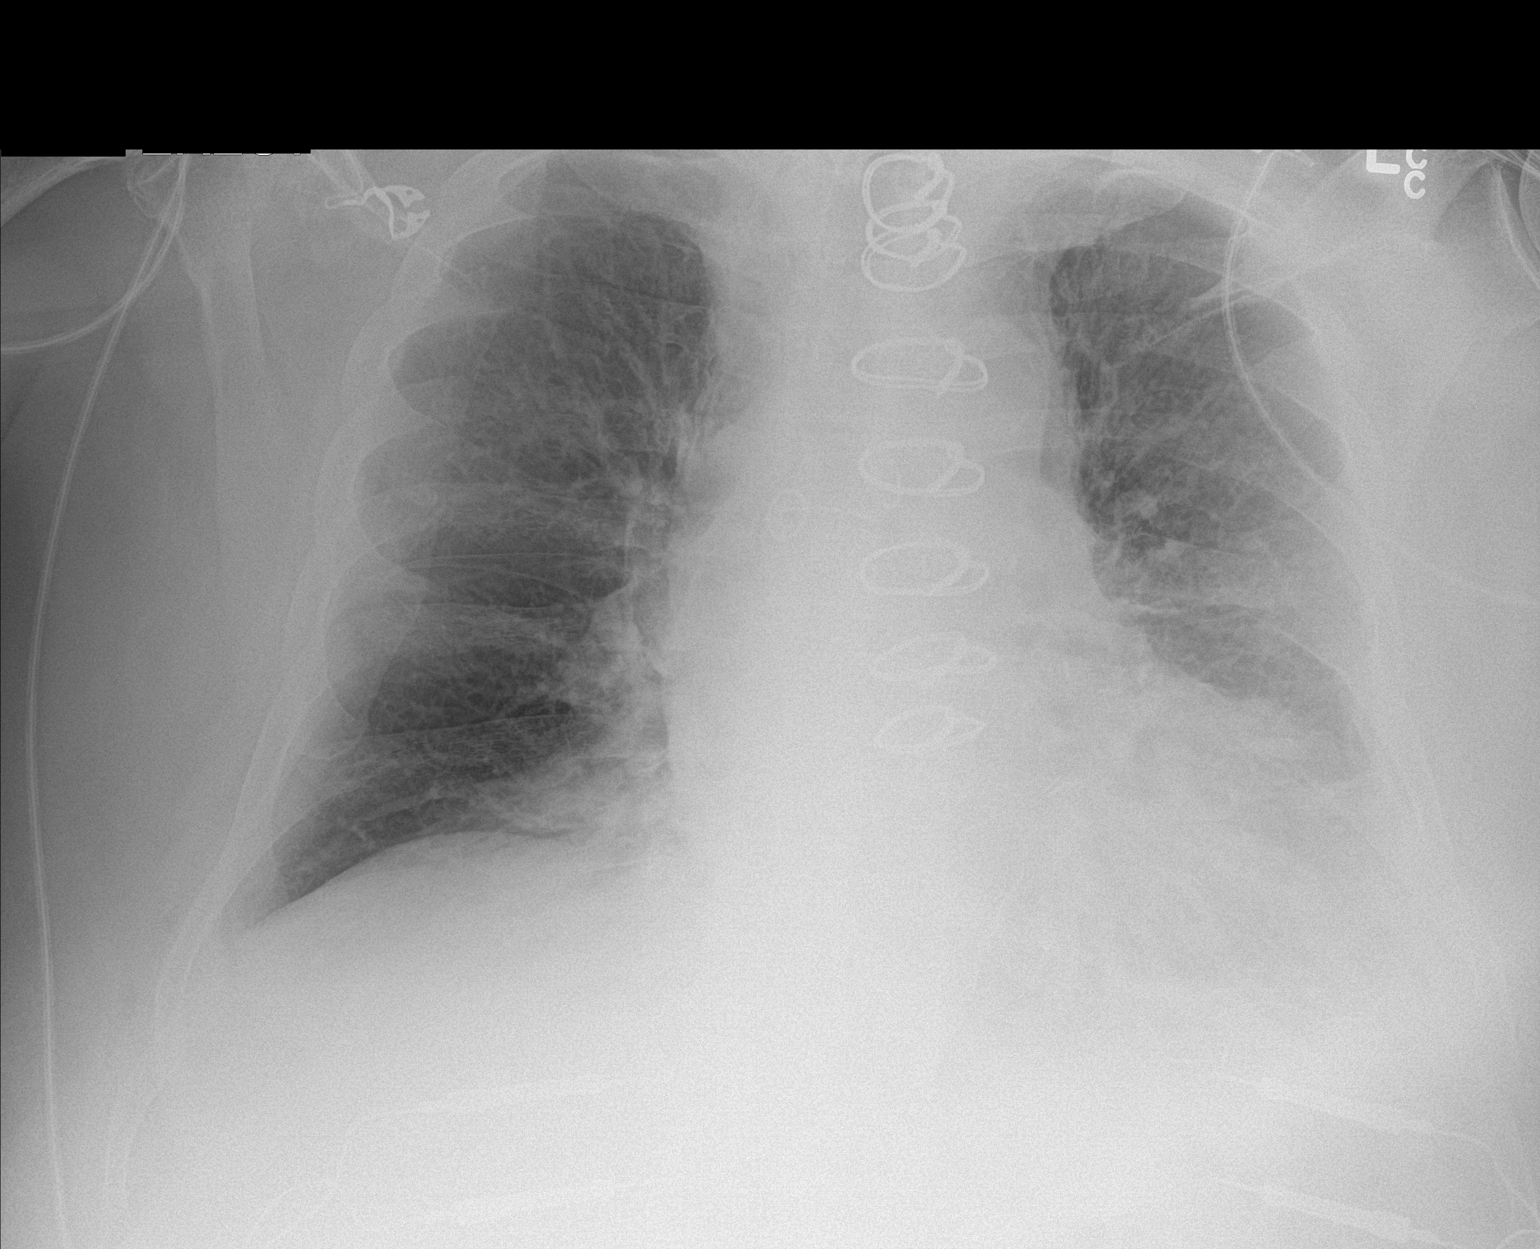

[1 of 1 positions shown; findings below may reference images not displayed]

FINDINGS: Right internal jugular Cordis sheath again identified.
Hyperinflation. Removal of bilateral chest tubes. Prior median
sternotomy. Midline trachea. Cardiomegaly accentuated by AP portable
technique. Possible small left pleural effusion. No pneumothorax.
mild pulmonary venous congestion. Left base airspace disease.
IMPRESSION: Removal of bilateral chest tubes, without pneumothorax.

Cardiomegaly with mild pulmonary venous congestion.

Decreased left base aeration with probable pleural fluid and
atelectasis.

## 2018-03-19 IMAGING — DX DG CHEST 2V
2 series · 2 of 2 positions shown · non-contrast
Comparison: October 19, 2015.

CLINICAL DATA: Status post pacemaker insertion.

EXAM:
CHEST  2 VIEW

[chest lat]
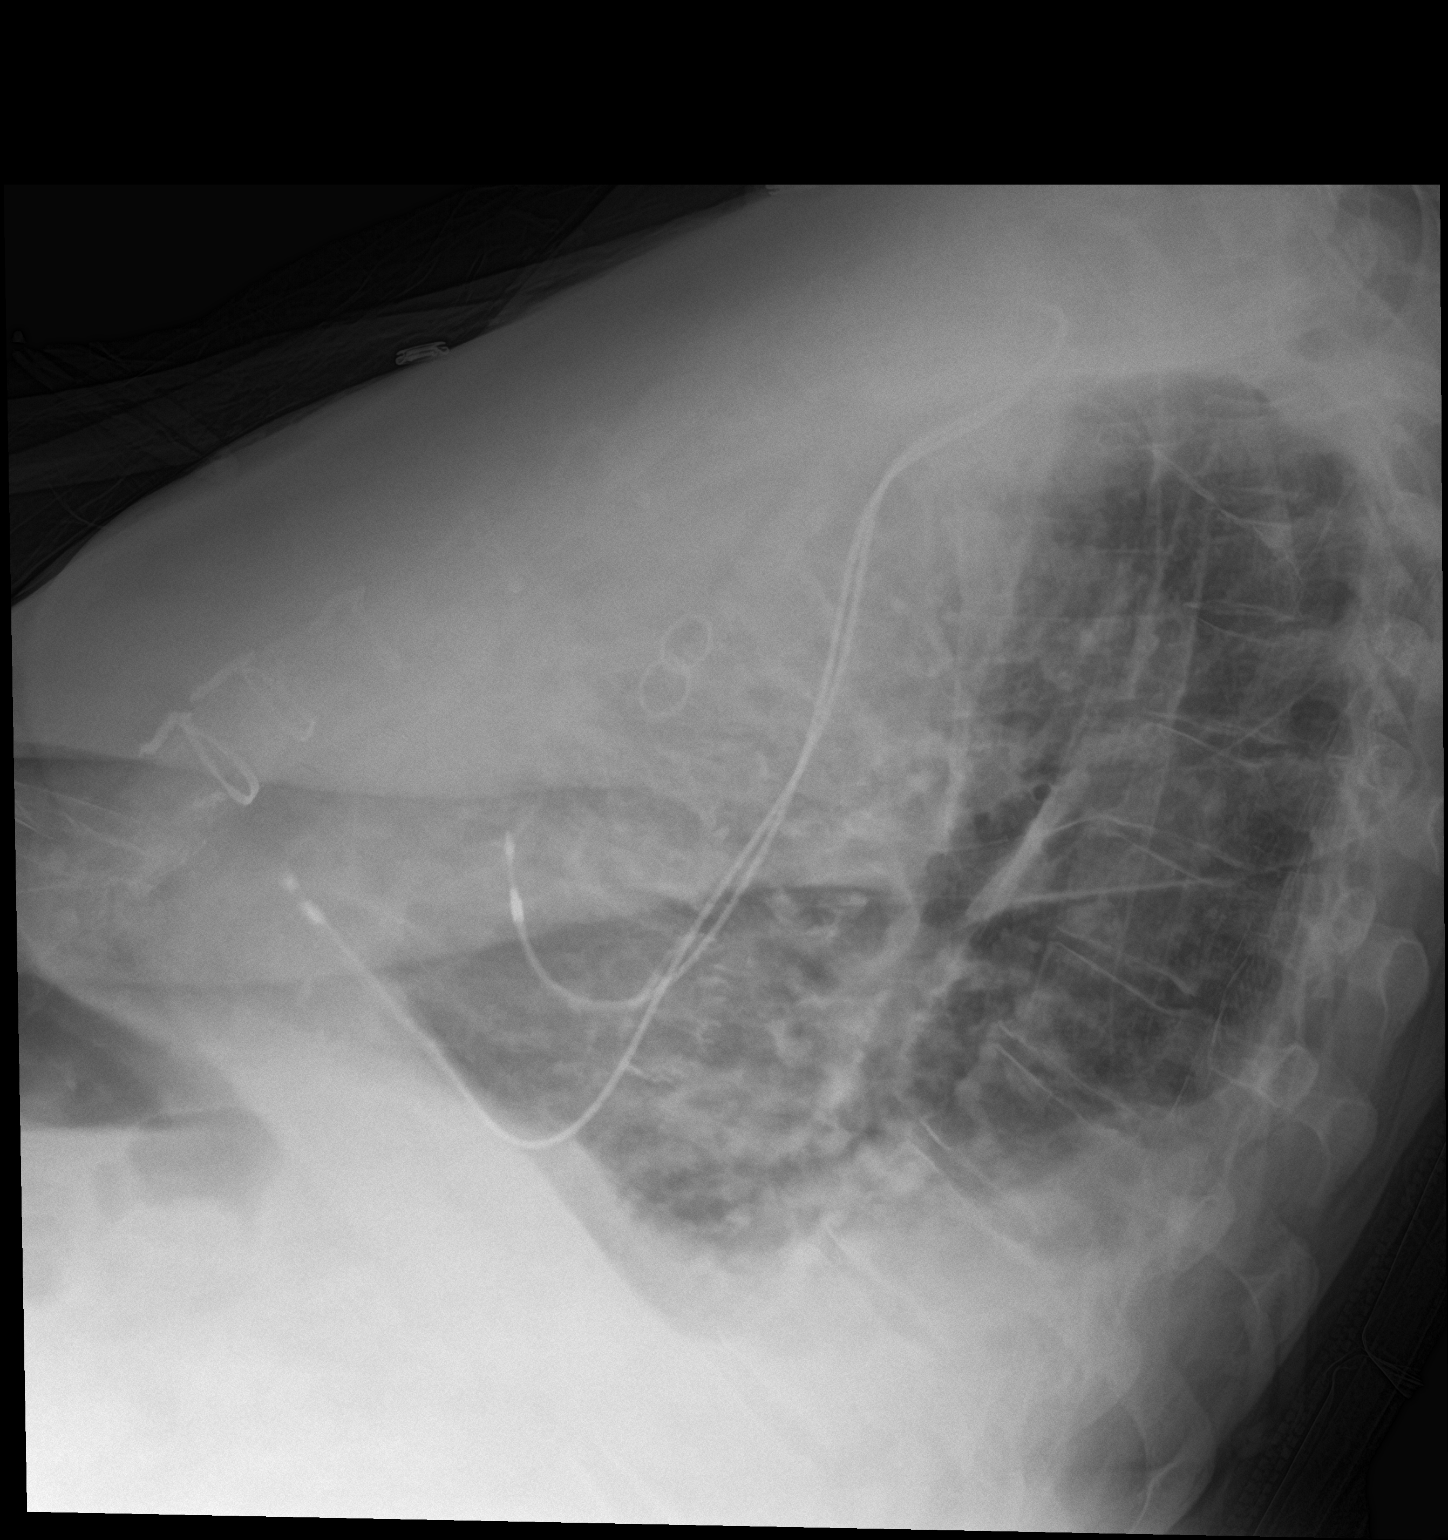

[chest ap]
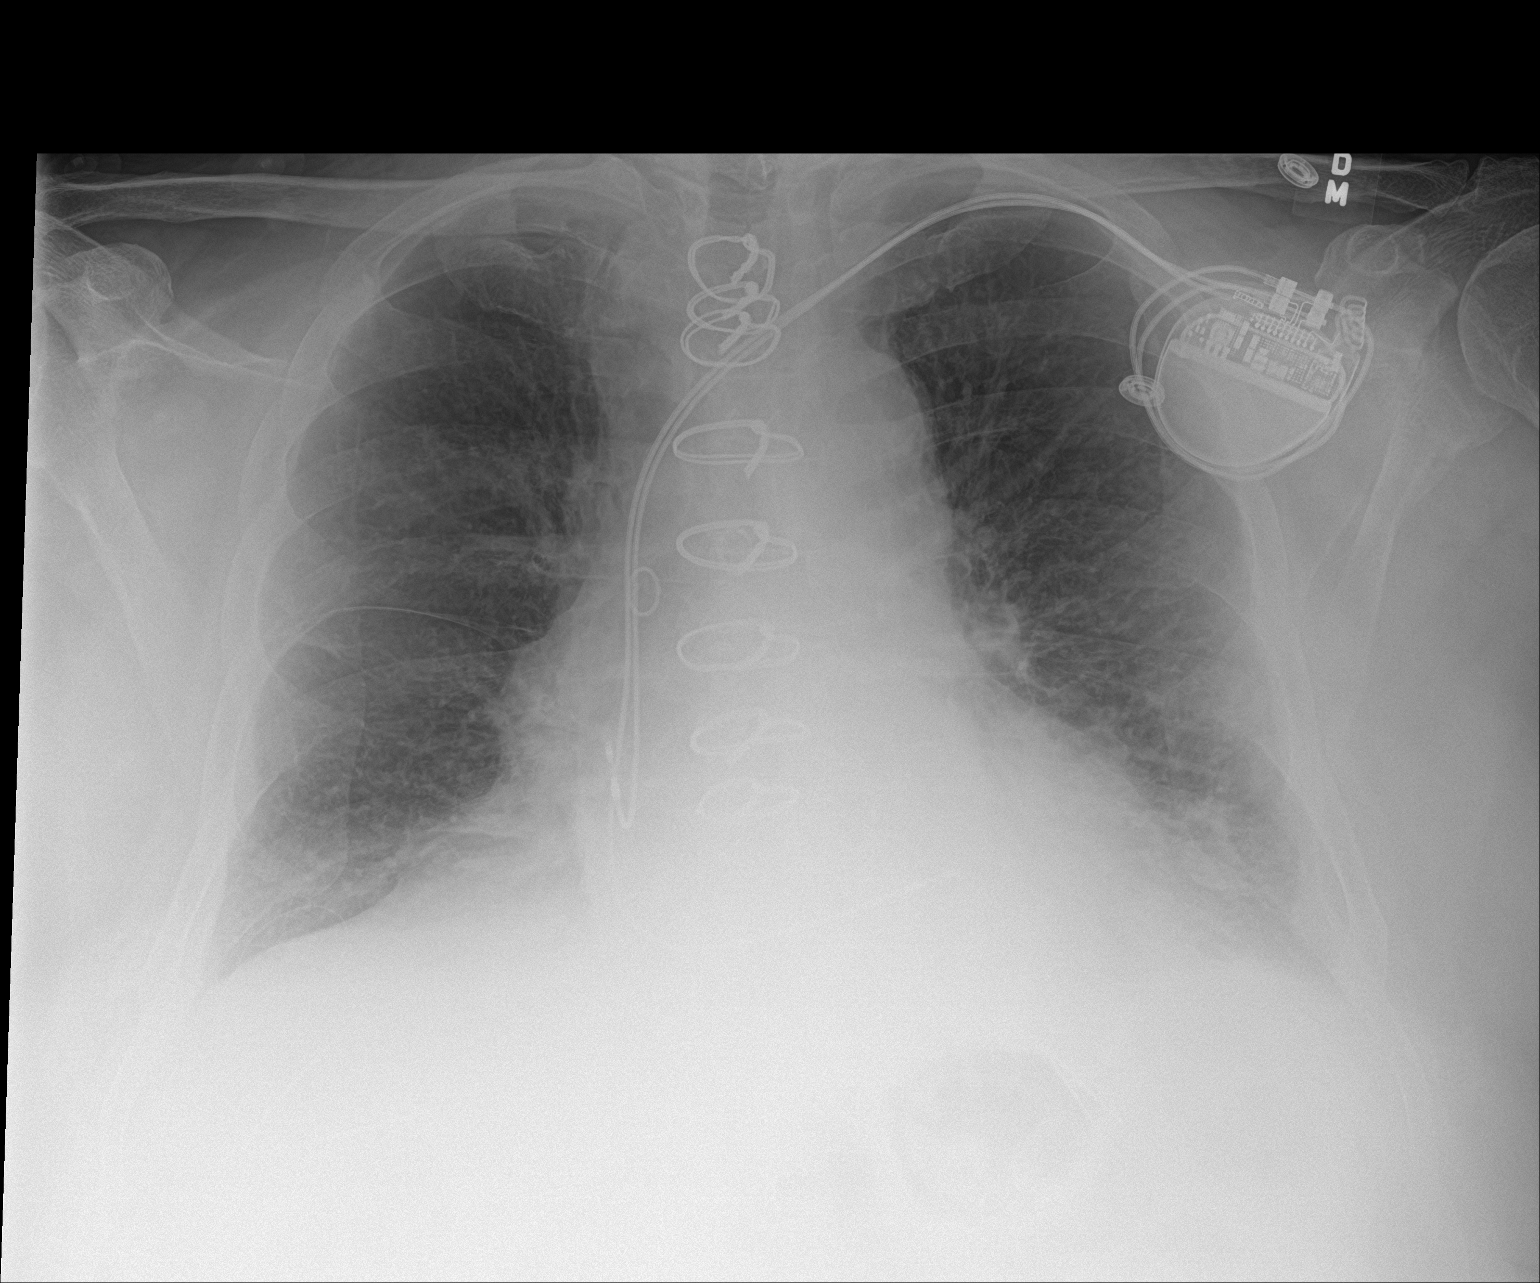

[2 of 2 positions shown; findings below may reference images not displayed]

FINDINGS: Stable cardiomegaly. Status post coronary artery bypass graft.
Left-sided pacemaker is noted with leads in grossly good position.
No pneumothorax is noted. Mild bilateral pleural effusions are
noted. Mild bibasilar subsegmental atelectasis is noted.
IMPRESSION: Mild bibasilar subsegmental atelectasis with mild bilateral pleural
effusions.

## 2018-03-20 ENCOUNTER — Encounter (HOSPITAL_COMMUNITY): Payer: Self-pay

## 2018-03-20 ENCOUNTER — Encounter (HOSPITAL_COMMUNITY)
Admission: RE | Admit: 2018-03-20 | Discharge: 2018-03-20 | Disposition: A | Discharge status: Home / Self Care | Payer: Medicare Other | Source: Ambulatory Visit | Attending: Pulmonary Disease | Admitting: Pulmonary Disease

## 2018-03-20 VITALS — BP 108/60 | HR 83 | Ht 71.0 in | Wt 217.0 lb

## 2018-03-20 DIAGNOSIS — J432 Centrilobular emphysema: Secondary | ICD-10-CM | POA: Diagnosis not present

## 2018-03-20 NOTE — Progress Notes (Signed)
Cardiac/Pulmonary Rehab Medication Review by a Pharmacist  Does the patient  feel that his/her medications are working for him/her?  yes  Has the patient been experiencing any side effects to the medications prescribed?  no  Does the patient measure his/her own blood pressure or blood glucose at home?  yes   Does the patient have any problems obtaining medications due to transportation or finances?   no  Understanding of regimen: excellent Understanding of indications: good Potential of compliance: excellent  Questions asked to Determine Patient Understanding of Medication Regimen:  1. What is the name of the medication?  2. What is the medication used for?  3. When should it be taken?  4. How much should be taken?  5. How will you take it?  6. What side effects should you report?  Understanding Defined as: Excellent: All questions above are correct Good: Questions 1-4 are correct Fair: Questions 1-2 are correct  Poor: 1 or none of the above questions are correct   Pharmacist comments:  Very pleasant man presented for pulmonary rehab. I reviewed his medications with him. He is compliant and tolerating current regimen. He is monitoring his BP and BS at least daily. Only suggestion was to get a prescription for coreg 12.5mg  because he is taking 2 x 6.25mg  twice daily. Continue current regimen.  Thanks for the opportunity to participate in the care of the patient,  Isac Sarna, BS [place]ena [place], [place] Pharmacist Pager [phone] 03/20/2018 1:55 PM

## 2018-03-20 NOTE — Progress Notes (Signed)
Daily Session Note  Patient Details  Name: MUSAB WINGARD MRN: [id number] Date of Birth: [birth year] Referring Provider:     PULMONARY REHAB OTHER RESP ORIENTATION from 03/20/2018 in [place]  Referring Provider  [place]      Encounter Date: 03/20/2018  Check In: Session Check In - 03/20/18 1230      Check-In   Supervising physician immediately available to respond to emergencies  See telemetry face sheet for immediately available MD    Location  [facility]    Staff Present  Russella Dar, MS, EP, [facility], Exercise Physiologist;Debra Wynetta Emery, RN, [facility], Exercise Physiologist    Medication changes reported      No    Fall or balance concerns reported     No    Tobacco Cessation  --   Quit 1987   Warm-up and Cool-down  Performed as group-led instruction    Resistance Training Performed  Yes    VAD Patient?  No    PAD/SET Patient?  No      Pain Assessment   Currently in Pain?  No/denies    Pain Score  0-No pain    Multiple Pain Sites  No       Capillary Blood Glucose: No results found for this or any previous visit (from the past 24 hour(s)).    Social History   Tobacco Use  Smoking Status Former Smoker  . Packs/day: 1.00  . Years: 28.00  . Pack years: 28.00  . Types: Cigarettes  . Start date: 10/21/1956  . Last attempt to quit: 05/10/1985  . Years since quitting: 32.8  Smokeless Tobacco Never Used    Goals Met:  Proper associated with RPD/PD & O2 Sat Improved SOB with ADL's Exercise tolerated well Personal goals reviewed Queuing for purse lip breathing No report of cardiac concerns or symptoms Strength training completed today  Goals Unmet:  Not Applicable  Comments: Check out: 1500   Dr. Sinda Du is Medical Director for [facility] Pulmonary Rehab.

## 2018-03-20 NOTE — Progress Notes (Signed)
Pulmonary Individual Treatment Plan  Patient Details  Name: Christian Rasmussen MRN: [id number] Date of Birth: 09/16/1940 Referring Provider:     PULMONARY REHAB OTHER RESP ORIENTATION from 03/20/2018 in [place]  Referring Provider  McQuaid      Initial Encounter Date:    PULMONARY REHAB OTHER RESP ORIENTATION from 03/20/2018 in [place]  Date  03/20/18      Visit Diagnosis: Centrilobular emphysema ([place])  Patient's Home Medications on Admission:   Current Outpatient Medications:  .  amLODipine (NORVASC) 10 MG tablet, Take 1 tablet (10 mg total) by mouth daily., Disp: 90 tablet, Rfl: 3 .  aspirin EC 81 MG tablet, Take 1 tablet (81 mg total) by mouth daily., Disp: 90 tablet, Rfl: 3 .  ASTAXANTHIN PO, Take 10 mg by mouth daily. , Disp: , Rfl:  .  atorvastatin (LIPITOR) 40 MG tablet, Take 40 mg by mouth daily. , Disp: , Rfl:  .  carvedilol (COREG) 6.25 MG tablet, TAKE 2 TABLETS TWICE DAILY, Disp: 360 tablet, Rfl: 3 .  Coenzyme Q10 200 MG capsule, Take 200 mg by mouth daily. , Disp: , Rfl:  .  furosemide (LASIX) 40 MG tablet, Take 1 tablet (40 mg total) by mouth 2 (two) times daily., Disp: 180 tablet, Rfl: 3 .  lisinopril (PRINIVIL,ZESTRIL) 20 MG tablet, Take 1 tablet (20 mg total) by mouth daily., Disp: 90 tablet, Rfl: 3 .  metFORMIN (GLUCOPHAGE) 500 MG tablet, Take 1,000 mg by mouth 2 (two) times daily with a meal. , Disp: , Rfl: 2 .  potassium chloride (KLOR-CON 10) 10 MEQ tablet, Take 1 tablet (10 mEq total) by mouth 2 (two) times daily., Disp: 180 tablet, Rfl: 3 .  warfarin (COUMADIN) 5 MG tablet, Take 1 1/2 tablets daily except 1 tablet on Mondays and Fridays, Disp: 135 tablet, Rfl: 3  Past Medical History: Past Medical History:  Diagnosis Date  . 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 Aortic stenosis 10/14/2015  . CAD (coronary artery disease)    status post Taxus stent patency of RCA 2004, Cardiolite negative for  ischemia in 2010.  . Diabetes mellitus ([place])   . Dyslipidemia   . History of kidney stones   . Hypertension   . Iron deficiency anemia 02/02/2018  . Mitral regurgitation 10/15/2015   Moderate-severe by intra-operative TEE  . Overweight   . Right bundle branch block (RBBB) with left anterior hemiblock   . 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 Snores   . Typical atrial flutter [facility])    s/p ablation 11-23-2012 by Dr Rayann Heman    Tobacco Use: Social History   Tobacco Use  Smoking Status Former Smoker  . Packs/day: 1.00  . Years: 28.00  . Pack years: 28.00  . Types: Cigarettes  . Start date: 10/21/1956  . Last attempt to quit: 05/10/1985  . Years since quitting: 32.8  Smokeless Tobacco Never Used    Labs: Recent Chemical engineer    Labs for ITP Cardiac and Pulmonary Rehab Latest Ref Rng & Units 10/16/2015 10/16/2015 10/16/2015 10/16/2015 10/18/2015   Cholestrol 0 - 200 mg/dL - - - - -   LDLCALC  0 - 99 mg/dL - - - - -   HDL >40 mg/dL - - - - -   Trlycerides <150 mg/dL - - - - -   Hemoglobin A1c 4.8 - 5.6 % - - - - -   PHART 7.350 - 7.450 - 7.394 7.415 - -   PCO2ART 35.0 - 45.0 mmHg - 35.5 36.2 - -   HCO3 20.0 - 24.0 mEq/L - 21.6 23.2 - -   TCO2 0 - 100 mmol/L 22 23 24 20 26    ACIDBASEDEF 0.0 - 2.0 mmol/L - 3.0(H) 1.0 - -   O2SAT % - 96.0 97.0 - -      Capillary Blood Glucose: Lab Results  Component Value Date   GLUCAP 122 (H) 09/16/2017   GLUCAP 168 (H) 03/20/2016   GLUCAP 94 03/19/2016   GLUCAP 114 (H) 03/19/2016   GLUCAP 96 03/19/2016     Pulmonary Assessment Scores: Pulmonary Assessment Scores    Row Name 03/20/18 1518         ADL UCSD   ADL Phase   Entry     SOB Score total  45     Rest  0     Walk  9     Stairs  2     Bath  1     Dress  1     Shop  0       CAT Score   CAT Score  16       mMRC Score   mMRC Score  3        Pulmonary Function Assessment: Pulmonary Function Assessment - 03/20/18 1515      Pulmonary Function Tests   FVC%  54 %    FEV1%  54 %    FEV1/FVC Ratio  73    RV%  4.37 %    DLCO%  13.97 %      Initial Spirometry Results   FVC%  57 %    FEV1%  57 %    FEV1/FVC Ratio  73      Post Bronchodilator Spirometry Results   FVC%  57 %    FEV1%  57 %    FEV1/FVC Ratio  73      Breath   Bilateral Breath Sounds  Clear    Shortness of Breath  Yes;Limiting activity       Exercise Target Goals: Exercise Program Goal: Individual exercise prescription set using results from initial 6 min walk test and THRR while considering  patient's activity barriers and safety.   Exercise Prescription Goal: Initial exercise prescription builds to 30-45 minutes a day of aerobic activity, 2-3 days per week.  Home exercise guidelines will be given to patient during program as part of exercise prescription that the participant will acknowledge.  Activity Barriers & Risk Stratification: Activity Barriers & Cardiac Risk Stratification - 03/20/18 1514      Activity Barriers & Cardiac Risk Stratification   Cardiac Risk Stratification  High       6 Minute Walk: 6 Minute Walk    Row Name 03/20/18 1353         6 Minute Walk   Phase  Initial     Distance  800 feet     Walk Time  5.41 minutes     # of Rest Breaks  1     MPH  1.51     METS  2.16     RPE  13     Perceived Dyspnea  13     VO2 Peak  6.65     Symptoms  No     Resting HR  83 bpm     Resting BP  108/60     Resting Oxygen Saturation   94 %     Exercise Oxygen Saturation  during 6 min walk  89 %     Max Ex. HR  115 bpm     Max Ex. BP  144/72     2 Minute Post BP  122/70        Oxygen Initial Assessment: Oxygen Initial Assessment - 03/20/18  1514      Home Oxygen   Home Oxygen Device  None       Oxygen Re-Evaluation:   Oxygen Discharge (Final Oxygen Re-Evaluation):   Initial Exercise Prescription: Initial Exercise Prescription - 03/20/18 1400      Date of Initial Exercise RX and Referring Provider   Date  03/20/18    Referring Provider  McQuaid    Expected Discharge Date  06/20/18      NuStep   Level  1    SPM  75    Minutes  17    METs  2.2      Recumbant Elliptical   Level  1    RPM  44    Watts  50    Minutes  17    METs  2.9      Prescription Details   Frequency (times per week)  2    Duration  Progress to 30 minutes of continuous aerobic without signs/symptoms of physical distress      Intensity   THRR 40-80% of Max Heartrate  107-120-132    Ratings of Perceived Exertion  11-13    Perceived Dyspnea  0-4      Progression   Progression  Continue progressive overload as per policy without signs/symptoms or physical distress.      Resistance Training   Training Prescription  Yes    Weight  1    Reps  10-15       Perform Capillary Blood Glucose checks as needed.  Exercise Prescription Changes:   Exercise Comments:   Exercise Goals and Review:  Exercise Goals    Row Name 03/20/18 1355             Exercise Goals   Increase Physical Activity  Yes       Intervention  Provide advice, education, support and counseling about physical activity/exercise needs.;Develop an individualized exercise prescription for aerobic and resistive training based on initial evaluation findings, risk stratification, comorbidities and participant's personal goals.       Expected Outcomes  Short Term: Attend rehab on a regular basis to increase amount of physical activity.       Increase Strength and Stamina  Yes       Intervention  Provide advice, education, support and counseling about physical activity/exercise needs.;Develop an individualized exercise prescription for aerobic and resistive training  based on initial evaluation findings, risk stratification, comorbidities and participant's personal goals.       Expected Outcomes  Short Term: Increase workloads from initial exercise prescription for resistance, speed, and METs.       Able to understand and use rate of perceived exertion (RPE) scale  Yes       Intervention  Provide education and explanation on how to use RPE scale       Expected Outcomes  Short Term: Able to use RPE daily in  rehab to express subjective intensity level;Long Term:  Able to use RPE to guide intensity level when exercising independently       Able to understand and use Dyspnea scale  Yes       Intervention  Provide education and explanation on how to use Dyspnea scale       Expected Outcomes  Short Term: Able to use Dyspnea scale daily in rehab to express subjective sense of shortness of breath during exertion;Long Term: Able to use Dyspnea scale to guide intensity level when exercising independently       Knowledge and understanding of Target Heart Rate Range (THRR)  Yes       Intervention  Provide education and explanation of THRR including how the numbers were predicted and where they are located for reference       Expected Outcomes  Short Term: Able to state/look up THRR;Long Term: Able to use THRR to govern intensity when exercising independently;Short Term: Able to use daily as guideline for intensity in rehab       Able to check pulse independently  Yes       Intervention  Provide education and demonstration on how to check pulse in carotid and radial arteries.;Review the importance of being able to check your own pulse for safety during independent exercise       Expected Outcomes  Short Term: Able to explain why pulse checking is important during independent exercise;Long Term: Able to check pulse independently and accurately       Understanding of Exercise Prescription  Yes       Intervention  Provide education, explanation, and written materials on patient's  individual exercise prescription       Expected Outcomes  Short Term: Able to explain program exercise prescription;Long Term: Able to explain home exercise prescription to exercise independently          Exercise Goals Re-Evaluation :   Discharge Exercise Prescription (Final Exercise Prescription Changes):   Nutrition:  Target Goals: Understanding of nutrition guidelines, daily intake of sodium 1500mg , cholesterol 200mg , calories 30% from fat and 7% or less from saturated fats, daily to have 5 or more servings of fruits and vegetables.  Biometrics: Pre Biometrics - 03/20/18 1355      Pre Biometrics   Height  5\' 11"  (1.803 m)    Waist Circumference  44 inches    Hip Circumference  43.5 inches    Waist to Hip Ratio  1.01 %    Triceps Skinfold  10 mm    % Body Fat  28.6 %    Grip Strength  18.7 kg    Single Leg Stand  1.5 seconds        Nutrition Therapy Plan and Nutrition Goals: Nutrition Therapy & Goals - 03/20/18 1459      Personal Nutrition Goals   Personal Goal #2  Patient is working on eating heart healthy diet.     Additional Goals?  No       Nutrition Assessments: Nutrition Assessments - 03/20/18 1459      MEDFICTS Scores   Pre Score  67       Nutrition Goals Re-Evaluation:   Nutrition Goals Discharge (Final Nutrition Goals Re-Evaluation):   Psychosocial: Target Goals: Acknowledge presence or absence of significant depression and/or stress, maximize coping skills, provide positive support system. Participant is able to verbalize types and ability to use techniques and skills needed for reducing stress and depression.  Initial Review & Psychosocial Screening: Initial Psych  Review & Screening - 03/20/18 1456      Initial Review   Current issues with  None Identified      Family Dynamics   Good Support System?  Yes    Concerns  Recent loss of child   Patient seems to be coping with this tragedy.     Barriers   Psychosocial barriers to  participate in program  There are no identifiable barriers or psychosocial needs.      Screening Interventions   Interventions  Encouraged to exercise    Expected Outcomes  Short Term goal: Identification and review with participant of any Quality of Life or Depression concerns found by scoring the questionnaire.;Long Term goal: The participant improves quality of Life and PHQ9 Scores as seen by post scores and/or verbalization of changes       Quality of Life Scores: Quality of Life - 03/20/18 1356      Quality of Life   Select  Quality of Life      Quality of Life Scores   Health/Function Pre  23.96 %    Socioeconomic Pre  25.5 %    Psych/Spiritual Pre  25.6 %    Family Pre  30 %    GLOBAL Pre  25.23 %      Scores of 19 and below usually indicate a poorer quality of life in these areas.  A difference of  2-3 points is a clinically meaningful difference.  A difference of 2-3 points in the total score of the Quality of Life Index has been associated with significant improvement in overall quality of life, self-image, physical symptoms, and general health in studies assessing change in quality of life.   PHQ-9: Recent Review Flowsheet Data    Depression screen [facility] 2/9 03/20/2018 11/26/2015   Decreased Interest 0 0   Down, Depressed, Hopeless 0 0   PHQ - 2 Score 0 0   Altered sleeping 0 0   Tired, decreased energy 0 1   Change in appetite 0 1   Feeling bad or failure about yourself  1 -   Trouble concentrating 0 0   Moving slowly or fidgety/restless 0 0   Suicidal thoughts 0 0   PHQ-9 Score 1 2   Difficult doing work/chores Not difficult at all Not difficult at all     Interpretation of Total Score  Total Score Depression Severity:  1-4 = Minimal depression, 5-9 = Mild depression, 10-14 = Moderate depression, 15-19 = Moderately severe depression, 20-27 = Severe depression   Psychosocial Evaluation and Intervention: Psychosocial Evaluation - 03/20/18 1458       Psychosocial Evaluation & Interventions   Interventions  Encouraged to exercise with the program and follow exercise prescription    Continue Psychosocial Services   No Follow up required       Psychosocial Re-Evaluation:   Psychosocial Discharge (Final Psychosocial Re-Evaluation):    Education: Education Goals: Education classes will be provided on a weekly basis, covering required topics. Participant will state understanding/return demonstration of topics presented.  Learning Barriers/Preferences: Learning Barriers/Preferences - 03/20/18 1447      Learning Barriers/Preferences   Learning Barriers  None    Learning Preferences  Individual Instruction;Group Instruction;Skilled Demonstration       Education Topics: How Lungs Work and Diseases: - Discuss the anatomy of the lungs and diseases that can affect the lungs, such as COPD.   Exercise: -Discuss the importance of exercise, FITT principles of exercise, normal and abnormal responses to exercise, and  how to exercise safely.   Environmental Irritants: -Discuss types of environmental irritants and how to limit exposure to environmental irritants.   Meds/Inhalers and oxygen: - Discuss respiratory medications, definition of an inhaler and oxygen, and the proper way to use an inhaler and oxygen.   Energy Saving Techniques: - Discuss methods to conserve energy and decrease shortness of breath when performing activities of daily living.    Bronchial Hygiene / Breathing Techniques: - Discuss breathing mechanics, pursed-lip breathing technique,  proper posture, effective ways to clear airways, and other functional breathing techniques   Cleaning Equipment: - Provides group verbal and written instruction about the health risks of elevated stress, cause of high stress, and healthy ways to reduce stress.   Nutrition I: Fats: - Discuss the types of cholesterol, what cholesterol does to the body, and how cholesterol levels  can be controlled.   Nutrition II: Labels: -Discuss the different components of food labels and how to read food labels.   Respiratory Infections: - Discuss the signs and symptoms of respiratory infections, ways to prevent respiratory infections, and the importance of seeking medical treatment when having a respiratory infection.   Stress I: Signs and Symptoms: - Discuss the causes of stress, how stress may lead to anxiety and depression, and ways to limit stress.   Stress II: Relaxation: -Discuss relaxation techniques to limit stress.   Oxygen for Home/Travel: - Discuss how to prepare for travel when on oxygen and proper ways to transport and store oxygen to ensure safety.   Knowledge Questionnaire Score: Knowledge Questionnaire Score - 03/20/18 1448      Knowledge Questionnaire Score   Pre Score  15/17       Core Components/Risk Factors/Patient Goals at Admission: Personal Goals and Risk Factors at Admission - 03/20/18 1459      Core Components/Risk Factors/Patient Goals on Admission    Weight Management  Weight Maintenance    Personal Goal Other  Yes    Personal Goal  Increase his activity    Intervention  Come to CR 3 days/week and supplement with 2 more days at home.     Expected Outcomes  To reach personal goals.        Core Components/Risk Factors/Patient Goals Review:    Core Components/Risk Factors/Patient Goals at Discharge (Final Review):    ITP Comments: ITP Comments    Row Name 03/20/18 1512           ITP Comments  Patient has been in our program before with his heart. He is returning with emphysema. He is now retired and plans to complete the program this time.           Comments: Patient arrived for 1st visit/orientation/education at 1230. Patient was referred to PR by DR. McQuaid due to Centrilobular Emphysema (J43.2). During orientation advised patient on arrival and appointment times what to wear, what to do before, during and after  exercise. Reviewed attendance and class policy. Talked about inclement weather and class consultation policy. Pt is scheduled to return Pulmonary Rehab on 03/28/18 at 10:45. Pt was advised to come to class 15 minutes before class starts. Patient was also given instructions on meeting with the dietician and attending the Family Structure classes. Discussed RPE/Dpysnea scales. Discussed initial THR and how to find their radial and/or carotid pulse. Discussed the initial exercise prescription and how this effects their progress. Pt is eager to get started. Patient participated in warm up stretches followed by light weights and resistance bands. Patient was  able to complete 6 minute walk test. Patient did not c/o pain. Patient was measured for the equipment. Discussed equipment safety with patient. Took patient pre-anthropometric measurements. Patient finished visit at 1500.

## 2018-03-28 ENCOUNTER — Encounter (HOSPITAL_COMMUNITY)
Admission: RE | Admit: 2018-03-28 | Discharge: 2018-03-28 | Disposition: A | Discharge status: Home / Self Care | Payer: Medicare Other | Source: Ambulatory Visit | Attending: Pulmonary Disease | Admitting: Pulmonary Disease

## 2018-03-28 DIAGNOSIS — J432 Centrilobular emphysema: Secondary | ICD-10-CM

## 2018-03-28 NOTE — Progress Notes (Signed)
Daily Session Note  Patient Details  Name: Christian Rasmussen MRN: [id number] Date of Birth: 1940-10-05 Referring Provider:     PULMONARY REHAB OTHER RESP ORIENTATION from 03/20/2018 in [place]  Referring Provider  McQuaid      Encounter Date: 03/28/2018  Check In: Session Check In - 03/28/18 1045      Check-In   Supervising physician immediately available to respond to emergencies  See telemetry face sheet for immediately available MD    Location  [facility]    Staff Present  Russella Dar, MS, EP, [facility], Exercise Physiologist;Bailee Metter Zachery Conch, Exercise Physiologist    Medication changes reported      No    Fall or balance concerns reported     No    Warm-up and Cool-down  Performed as group-led instruction    Resistance Training Performed  Yes    VAD Patient?  No    PAD/SET Patient?  No      Pain Assessment   Currently in Pain?  No/denies    Pain Score  0-No pain    Multiple Pain Sites  No       Capillary Blood Glucose: No results found for this or any previous visit (from the past 24 hour(s)).    Social History   Tobacco Use  Smoking Status Former Smoker  . Packs/day: 1.00  . Years: 28.00  . Pack years: 28.00  . Types: Cigarettes  . Start date: 10/21/1956  . Last attempt to quit: 05/10/1985  . Years since quitting: 32.9  Smokeless Tobacco Never Used    Goals Met:  Proper associated with RPD/PD & O2 Sat Using PLB without cueing & demonstrates good technique Exercise tolerated well No report of cardiac concerns or symptoms Strength training completed today  Goals Unmet:  Not Applicable  Comments: Pt able to follow exercise prescription today without complaint.  Will continue to monitor for progression. Check out 11:45.   Dr. Sinda Du is Medical Director for [facility].

## 2018-03-29 ENCOUNTER — Ambulatory Visit: Payer: Medicare Other | Admitting: Pulmonary Disease

## 2018-03-30 ENCOUNTER — Ambulatory Visit: Payer: Medicare Other | Admitting: Pulmonary Disease

## 2018-03-30 ENCOUNTER — Encounter (HOSPITAL_COMMUNITY): Payer: Medicare Other

## 2018-03-30 ENCOUNTER — Ambulatory Visit (INDEPENDENT_AMBULATORY_CARE_PROVIDER_SITE_OTHER): Payer: Medicare Other | Admitting: Primary Care

## 2018-03-30 ENCOUNTER — Encounter: Payer: Self-pay | Admitting: Primary Care

## 2018-03-30 ENCOUNTER — Ambulatory Visit (INDEPENDENT_AMBULATORY_CARE_PROVIDER_SITE_OTHER)
Admission: RE | Admit: 2018-03-30 | Discharge: 2018-03-30 | Disposition: A | Discharge status: Home / Self Care | Payer: Medicare Other | Source: Ambulatory Visit | Attending: Primary Care | Admitting: Primary Care

## 2018-03-30 VITALS — BP 118/58 | HR 101 | Temp 97.5°F | Ht 71.0 in | Wt 217.4 lb

## 2018-03-30 DIAGNOSIS — I5031 Acute diastolic (congestive) heart failure: Secondary | ICD-10-CM | POA: Diagnosis not present

## 2018-03-30 DIAGNOSIS — J90 Pleural effusion, not elsewhere classified: Secondary | ICD-10-CM | POA: Diagnosis not present

## 2018-03-30 DIAGNOSIS — J9811 Atelectasis: Secondary | ICD-10-CM | POA: Diagnosis not present

## 2018-03-30 DIAGNOSIS — R0600 Dyspnea, unspecified: Secondary | ICD-10-CM | POA: Diagnosis not present

## 2018-03-30 DIAGNOSIS — G4733 Obstructive sleep apnea (adult) (pediatric): Secondary | ICD-10-CM

## 2018-03-30 LAB — BASIC METABOLIC PANEL
BUN: 30 mg/dL — AB (ref 6–23)
CO2: 24 mEq/L (ref 19–32)
CREATININE: 1.19 mg/dL (ref 0.40–1.50)
Calcium: 10.2 mg/dL (ref 8.4–10.5)
Chloride: 107 mEq/L (ref 96–112)
GFR: 63.27 mL/min (ref >60.00)
GLUCOSE: 87 mg/dL (ref 70–99)
POTASSIUM: 4.7 meq/L (ref 3.5–5.1)
Sodium: 143 mEq/L (ref 135–145)

## 2018-03-30 LAB — BRAIN NATRIURETIC PEPTIDE: PRO B NATRI PEPTIDE: 513 pg/mL — AB (ref 0.0–100.0)

## 2018-03-30 NOTE — Patient Instructions (Addendum)
Obstructive sleep apnea: Excellent job with CPAP use Continue to wear every night for 4 to 6 hours or more Do not drive if experiencing excessive daytime fatigue or somnolence Avoid sedating medication prior to bedtime  Diastolic heart failure: Continue Lasix 40 mg daily, until told otherwise Monitor weight daily Follow 2 L fluid restriction Monitor sodium in diet  Pulmonary rehab for emphysema  Orders: Chest x-ray today to follow-up on pleural effusion Labs today  Follow-up: 3 to 4 months with Dr. Lake Bells or NP     Heart-Healthy Eating Plan Heart-healthy meal planning includes:  Limiting unhealthy fats.  Increasing healthy fats.  Making other small dietary changes.  You may need to talk with your doctor or a diet specialist (dietitian) to create an eating plan that is right for you. What types of fat should I choose?  Choose healthy fats. These include olive oil and canola oil, flaxseeds, walnuts, almonds, and seeds.  Eat more omega-3 fats. These include salmon, mackerel, sardines, tuna, flaxseed oil, and ground flaxseeds. Try to eat fish at least twice each week.  Limit saturated fats. ? Saturated fats are often found in animal products, such as meats, butter, and cream. ? Plant sources of saturated fats include palm oil, palm kernel oil, and coconut oil.  Avoid foods with partially hydrogenated oils in them. These include stick margarine, some tub margarines, cookies, crackers, and other baked goods. These contain trans fats. What general guidelines do I need to follow?  Check food labels carefully. Identify foods with trans fats or high amounts of saturated fat.  Fill one half of your plate with vegetables and green salads. Eat 4-5 servings of vegetables per day. A serving of vegetables is: ? 1 cup of raw leafy vegetables. ?  cup of raw or cooked cut-up vegetables. ?  cup of vegetable juice.  Fill one fourth of your plate with whole grains. Look for the  word "whole" as the first word in the ingredient list.  Fill one fourth of your plate with lean protein foods.  Eat 4-5 servings of fruit per day. A serving of fruit is: ? One medium whole fruit. ?  cup of dried fruit. ?  cup of fresh, frozen, or canned fruit. ?  cup of 100% fruit juice.  Eat more foods that contain soluble fiber. These include apples, broccoli, carrots, beans, peas, and barley. Try to get 20-30 g of fiber per day.  Eat more home-cooked food. Eat less restaurant, buffet, and fast food.  Limit or avoid alcohol.  Limit foods high in starch and sugar.  Avoid fried foods.  Avoid frying your food. Try baking, boiling, grilling, or broiling it instead. You can also reduce fat by: ? Removing the skin from poultry. ? Removing all visible fats from meats. ? Skimming the fat off of stews, soups, and gravies before serving them. ? Steaming vegetables in water or broth.  Lose weight if you are overweight.  Eat 4-5 servings of nuts, legumes, and seeds per week: ? One serving of dried beans or legumes equals  cup after being cooked. ? One serving of nuts equals 1 ounces. ? One serving of seeds equals  ounce or one tablespoon.  You may need to keep track of how much salt or sodium you eat. This is especially true if you have high blood pressure. Talk with your doctor or dietitian to get more information. What foods can I eat? Grains Breads, including [place], white, pita, wheat, raisin, rye, oatmeal, and [place].  Tortillas that are neither fried nor made with lard or trans fat. Low-fat rolls, including hotdog and hamburger buns and English muffins. Biscuits. Muffins. Waffles. Pancakes. Light popcorn. Whole-grain cereals. Flatbread. Melba toast. Pretzels. Breadsticks. Rusks. Low-fat snacks. Low-fat crackers, including oyster, saltine, matzo, graham, animal, and rye. Rice and pasta, including brown rice and pastas that are made with whole wheat. Vegetables All  vegetables. Fruits All fruits, but limit coconut. Meats and Other Protein Sources Lean, well-trimmed beef, veal, pork, and lamb. Chicken and [place] without skin. All fish and shellfish. Wild duck, rabbit, pheasant, and venison. Egg whites or low-cholesterol egg substitutes. Dried beans, peas, lentils, and tofu. Seeds and most nuts. Dairy Low-fat or nonfat cheeses, including ricotta, string, and mozzarella. Skim or 1% milk that is liquid, powdered, or evaporated. Buttermilk that is made with low-fat milk. Nonfat or low-fat yogurt. Beverages Mineral water. Diet carbonated beverages. Sweets and Desserts Sherbets and fruit ices. Honey, jam, marmalade, jelly, and syrups. Meringues and gelatins. Pure sugar candy, such as hard candy, jelly beans, gumdrops, mints, marshmallows, and small amounts of dark chocolate. [employer] Eat all sweets and desserts in moderation. Fats and Oils Nonhydrogenated (trans-free) margarines. Vegetable oils, including soybean, sesame, sunflower, olive, peanut, safflower, corn, canola, and cottonseed. Salad dressings or mayonnaise made with a vegetable oil. Limit added fats and oils that you use for cooking, baking, salads, and as spreads. Other Cocoa powder. Coffee and tea. All seasonings and condiments. The items listed above may not be a complete list of recommended foods or beverages. Contact your dietitian for more options. What foods are not recommended? Grains Breads that are made with saturated or trans fats, oils, or whole milk. Croissants. Butter rolls. Cheese breads. Sweet rolls. Donuts. Buttered popcorn. Chow mein noodles. High-fat crackers, such as cheese or butter crackers. Meats and Other Protein Sources Fatty meats, such as hotdogs, short ribs, sausage, spareribs, bacon, rib eye roast or steak, and mutton. High-fat deli meats, such as salami and bologna. Caviar. Domestic duck and goose. Organ meats, such as kidney, liver, sweetbreads, and  heart. Dairy Cream, sour cream, cream cheese, and creamed cottage cheese. Whole-milk cheeses, including blue (bleu), [place] Jack, [place], [place], [place], [place], Swiss, cheddar, [place], and [place]. Whole or 2% milk that is liquid, evaporated, or condensed. Whole buttermilk. Cream sauce or high-fat cheese sauce. Yogurt that is made from whole milk. Beverages Regular sodas and juice drinks with added sugar. Sweets and Desserts Frosting. Pudding. Cookies. Cakes other than angel food cake. Candy that has milk chocolate or white chocolate, hydrogenated fat, butter, coconut, or unknown ingredients. Buttered syrups. Full-fat ice cream or ice cream drinks. Fats and Oils Gravy that has suet, meat fat, or shortening. Cocoa butter, hydrogenated oils, palm oil, coconut oil, palm kernel oil. These can often be found in baked products, candy, fried foods, nondairy creamers, and whipped toppings. Solid fats and shortenings, including bacon fat, salt pork, lard, and butter. Nondairy cream substitutes, such as coffee creamers and sour cream substitutes. Salad dressings that are made of unknown oils, cheese, or sour cream. The items listed above may not be a complete list of foods and beverages to avoid. Contact your dietitian for more information. This information is not intended to replace advice given to you by your health care provider. Make sure you discuss any questions you have with your health care provider. Document Released: 10/26/2011 Document Revised: 10/02/2015 Document Reviewed: 10/18/2013 Elsevier Interactive Patient Education  2018 [employer].    Heart Failure Heart failure means  your heart has trouble pumping blood. This makes it hard for your body to work well. Heart failure is usually a long-term (chronic) condition. You must take good care of yourself and follow your doctor's treatment plan. Follow these instructions at home:  Take your heart medicine as told by your doctor. ? Do not  stop taking medicine unless your doctor tells you to. ? Do not skip any dose of medicine. ? Refill your medicines before they run out. ? Take other medicines only as told by your doctor or pharmacist.  Stay active if told by your doctor. The elderly and people with severe heart failure should talk with a doctor about physical activity.  Eat heart-healthy foods. Choose foods that are without trans fat and are low in saturated fat, cholesterol, and salt (sodium). This includes fresh or frozen fruits and vegetables, fish, lean meats, fat-free or low-fat dairy foods, whole grains, and high-fiber foods. Lentils and dried peas and beans (legumes) are also good choices.  Limit salt if told by your doctor.  Cook in a healthy way. Roast, grill, broil, bake, poach, steam, or stir-fry foods.  Limit fluids as told by your doctor.  Weigh yourself every morning. Do this after you pee (urinate) and before you eat breakfast. Write down your weight to give to your doctor.  Take your blood pressure and write it down if your doctor tells you to.  Ask your doctor how to check your pulse. Check your pulse as told.  Lose weight if told by your doctor.  Stop smoking or chewing tobacco. Do not use gum or patches that help you quit without your doctor's approval.  Schedule and go to doctor visits as told.  Nonpregnant women should have no more than 1 drink a day. Men should have no more than 2 drinks a day. Talk to your doctor about drinking alcohol.  Stop illegal drug use.  Stay current with shots (immunizations).  Manage your health conditions as told by your doctor.  Learn to manage your stress.  Rest when you are tired.  If it is really hot outside: ? Avoid intense activities. ? Use air conditioning or fans, or get in a cooler place. ? Avoid caffeine and alcohol. ? Wear loose-fitting, lightweight, and light-colored clothing.  If it is really cold outside: ? Avoid intense activities. ? Layer  your clothing. ? Wear mittens or gloves, a hat, and a scarf when going outside. ? Avoid alcohol.  Learn about heart failure and get support as needed.  Get help to maintain or improve your quality of life and your ability to care for yourself as needed. Contact a doctor if:  You gain weight quickly.  You are more short of breath than usual.  You cannot do your normal activities.  You tire easily.  You cough more than normal, especially with activity.  You have any or more puffiness (swelling) in areas such as your hands, feet, ankles, or belly (abdomen).  You cannot sleep because it is hard to breathe.  You feel like your heart is beating fast (palpitations).  You get dizzy or light-headed when you stand up. Get help right away if:  You have trouble breathing.  There is a change in mental status, such as becoming less alert or not being able to focus.  You have chest pain or discomfort.  You faint. This information is not intended to replace advice given to you by your health care provider. Make sure you discuss any questions you  have with your health care provider. Document Released: 02/03/2008 Document Revised: 10/02/2015 Document Reviewed: 06/12/2012 Elsevier Interactive Patient Education  2017 [employer].

## 2018-03-30 NOTE — Progress Notes (Signed)
@Patient  ID: Christian Rasmussen, male    DOB: Feb 01, 1941, 77 y.o.   MRN: [id number]  Chief Complaint  Patient presents with  . Follow-up    SOB better, CPAP doing well    Referring provider: Monico Blitz, MD  HPI: 77 year old male, former smoker quit in [place] (28 pack year hx). PMH acute diastolic heart failure, CAD, hypertension, NSTEMI, RBBB, hypoxia, OSA, pleural effusion. Patient of Dr. Lake Bells, last seen 02/15/18. [place] to be doing better in terms of his dyspnea during that visit. Continues diuresis with lasix 40mg  twice day.     03/31/2018 Patient presents today for general office visit. He is doing well. Breathing is about the same, a little bit better. He is not experiencing shortness of breath at rest. Recently started pulmonary rehab. Continues wearing CPAP, he's 99% compliant with use. Only taking lasix 40mg  daily, doesn't feel he has a lot of swelling. Weight is stable at 217 lbs (219lbs previous visit). Denies cough or wheezing   Airview Download: 89/90 used; 78 days >4 hours Average use 6 hrs 70min Auto titrate pressure 5-15cm H20 Pressure 11.6 (95%) Minimal air leaks AHI 2.5   No Known Allergies  Immunization History  Administered Date(s) Administered  . Influenza Inj Mdck Quad Pf 01/25/2018  . Influenza,inj,quad, With Preservative 02/12/2017  . Pneumococcal Conjugate-13 05/10/2016  . Zoster Recombinat (Shingrix) 03/10/2017, 06/16/2017    Past Medical History:  Diagnosis Date  . 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 Aortic stenosis 10/14/2015  . CAD (coronary artery disease)    status post Taxus stent patency of RCA 2004, Cardiolite negative for ischemia in 2010.  . Diabetes mellitus ([place])   . Dyslipidemia   . History of kidney stones   . Hypertension   . Iron deficiency anemia 02/02/2018  . Mitral regurgitation 10/15/2015   Moderate-severe by intra-operative TEE  . Overweight   . Right bundle branch block (RBBB) with left anterior hemiblock   .  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 Snores   . Typical atrial flutter [facility])    s/p ablation 11-23-2012 by Dr Rayann Heman    Tobacco History: Social History   Tobacco Use  Smoking Status Former Smoker  . Packs/day: 1.00  . Years: 28.00  . Pack years: 28.00  . Types: Cigarettes  . Start date: 10/21/1956  . Last attempt to quit: 05/10/1985  . Years since quitting: 32.9  Smokeless Tobacco Never Used   Counseling given: Not Answered   Outpatient Medications Prior to Visit  Medication Sig Dispense Refill  . amLODipine (NORVASC) 10 MG tablet Take 1 tablet (10 mg total) by mouth daily. 90 tablet 3  . aspirin EC 81 MG tablet Take 1 tablet (81 mg total) by mouth daily. 90 tablet 3  . ASTAXANTHIN PO Take 10 mg by mouth daily.     Marland Kitchen atorvastatin (LIPITOR) 40 MG tablet Take 40 mg by mouth daily.     . carvedilol (COREG) 6.25 MG tablet TAKE 2 TABLETS TWICE DAILY 360 tablet 3  . Coenzyme Q10 200 MG capsule Take 200 mg by mouth daily.     . furosemide (LASIX) 40 MG tablet Take 1 tablet (  40 mg total) by mouth 2 (two) times daily. 180 tablet 3  . lisinopril (PRINIVIL,ZESTRIL) 20 MG tablet Take 1 tablet (20 mg total) by mouth daily. 90 tablet 3  . metFORMIN (GLUCOPHAGE) 500 MG tablet Take 1,000 mg by mouth 2 (two) times daily with a meal.   2  . potassium chloride (KLOR-CON 10) 10 MEQ tablet Take 1 tablet (10 mEq total) by mouth 2 (two) times daily. 180 tablet 3  . warfarin (COUMADIN) 5 MG tablet Take 1 1/2 tablets daily except 1 tablet on Mondays and Fridays 135 tablet 3   No facility-administered medications prior to visit.     Review of Systems  Review of Systems    Constitutional: Negative.   HENT: Negative.   Respiratory: Positive for shortness of breath and wheezing. Negative for apnea, cough, choking, chest tightness and stridor.   Cardiovascular: Negative.   Psychiatric/Behavioral: Negative.     Physical Exam  BP (!) 118/58 (BP Location: Left Arm, Cuff Size: Normal)   Pulse (!) 101   Temp (!) 97.5 F (36.4 C)   Ht 5\' 11"  (1.803 m)   Wt 217 lb 6.4 oz (98.6 kg)   SpO2 90%   BMI 30.32 kg/m    Physical Exam  Constitutional: He is oriented to person, place, and time. He appears well-developed and well-nourished. No distress.  HENT:  Head: Normocephalic and atraumatic.  Eyes: Pupils are equal, round, and reactive to light. EOM are normal.  Neck: Normal range of motion. Neck supple.  Cardiovascular: Normal rate and regular rhythm.  Pulmonary/Chest: Effort normal. No respiratory distress. He has no wheezes.  CTA, diminished left base   Musculoskeletal: Normal range of motion.  Neurological: He is alert and oriented to person, place, and time.  Skin: Skin is warm and dry.  Psychiatric: He has a normal mood and affect. His behavior is normal. Judgment and thought content normal.     Lab Results:  CBC    Component Value Date/Time   WBC 9.3 02/02/2018 0906   RBC 4.04 (L) 02/02/2018 0906   HGB 11.8 (L) 02/02/2018 0906   HGB 12.9 (L) 09/12/2017 0958   HCT 36.5 (L) 02/02/2018 0906   HCT 38.2 09/12/2017 0958   PLT 302 02/02/2018 0906   PLT 323 09/12/2017 0958   MCV 90.3 02/02/2018 0906   MCV 87 09/12/2017 0958   MCH 29.2 02/02/2018 0906   MCHC 32.3 02/02/2018 0906   RDW 17.1 (H) 02/02/2018 0906   RDW 16.1 (H) 09/12/2017 0958   LYMPHSABS 0.8 02/02/2018 0906   MONOABS 0.7 02/02/2018 0906   EOSABS 0.1 02/02/2018 0906   BASOSABS 0.0 02/02/2018 0906    BMET    Component Value Date/Time   NA 143 03/30/2018 1114   NA 142 09/12/2017 0958   K 4.7 03/30/2018 1114   CL 107 03/30/2018 1114   CO2 24 03/30/2018 1114   GLUCOSE 87  03/30/2018 1114   BUN 30 (H) 03/30/2018 1114   BUN 21 09/12/2017 0958   CREATININE 1.19 03/30/2018 1114   CALCIUM 10.2 03/30/2018 1114   GFRNONAA >60 02/02/2018 0906   GFRAA >60 02/02/2018 0906    BNP    Component Value Date/Time   BNP 271.0 (H) 01/25/2017 1415    ProBNP    Component Value Date/Time   PROBNP 513.0 (H) 03/30/2018 1114    Imaging: Dg Chest 2 View  Result Date: 03/30/2018 CLINICAL DATA:  Chronic dyspnea. EXAM: CHEST - 2 VIEW COMPARISON:  Radiographs of January 18, 2018. FINDINGS: Stable cardiomediastinal silhouette. Status post coronary artery bypass graft. Left-sided pacemaker is unchanged in position. No pneumothorax is noted. Stable mild left pleural effusion is noted with associated atelectasis. Stable minimal right pleural effusion is noted. Bony thorax is unremarkable. Atherosclerosis of thoracic aorta is noted. IMPRESSION: Stable mild left basilar subsegmental atelectasis with mild left pleural effusion. Minimal right pleural effusion is noted. Aortic Atherosclerosis (ICD10-I70.0). Electronically Signed   By: Marijo Conception, M.D.   On: 03/30/2018 14:58     Assessment & Plan:   OSA (obstructive sleep apnea) - Compliant with CPAP use, reports benefit  - Encouraged patient to wear every night for 4 to 6 hr or more  Acute diastolic CHF (congestive heart failure) (HCC) - Breathing is stable if not improved.  - Patient reports only takings lasix once a day - BNP 513 (431), cr 1.1  - Instructed patient to take lasix twice a day  - Heart failure education given to patient and reviewed  Pleural effusion, bilateral Repeat CXR 03/30/2018- showed mild left pleural effusion and minimal right pleural effusion noted  FU in 3-4 months   Martyn Ehrich, NP 03/31/2018

## 2018-03-31 ENCOUNTER — Encounter (HOSPITAL_COMMUNITY): Payer: Medicare Other

## 2018-03-31 NOTE — Assessment & Plan Note (Addendum)
-   Breathing is stable if not improved.  - Patient reports only takings lasix once a day - BNP 513 (431), cr 1.1  - Instructed patient to take lasix twice a day  - Heart failure education given to patient and reviewed

## 2018-03-31 NOTE — Assessment & Plan Note (Signed)
Repeat CXR 03/30/2018- showed mild left pleural effusion and minimal right pleural effusion noted

## 2018-03-31 NOTE — Assessment & Plan Note (Addendum)
-   Compliant with CPAP use, reports benefit  - Encouraged patient to wear every night for 4 to 6 hr or more

## 2018-04-03 ENCOUNTER — Ambulatory Visit: Payer: Medicare Other | Admitting: Pulmonary Disease

## 2018-04-03 NOTE — Progress Notes (Signed)
Reviewed, agree 

## 2018-04-04 ENCOUNTER — Encounter (HOSPITAL_COMMUNITY)
Admission: RE | Admit: 2018-04-04 | Discharge: 2018-04-04 | Disposition: A | Discharge status: Home / Self Care | Payer: Medicare Other | Source: Ambulatory Visit | Attending: Pulmonary Disease | Admitting: Pulmonary Disease

## 2018-04-04 ENCOUNTER — Ambulatory Visit (INDEPENDENT_AMBULATORY_CARE_PROVIDER_SITE_OTHER): Payer: Medicare Other | Admitting: *Deleted

## 2018-04-04 DIAGNOSIS — I4891 Unspecified atrial fibrillation: Secondary | ICD-10-CM

## 2018-04-04 DIAGNOSIS — Z5181 Encounter for therapeutic drug level monitoring: Secondary | ICD-10-CM | POA: Diagnosis not present

## 2018-04-04 DIAGNOSIS — J432 Centrilobular emphysema: Secondary | ICD-10-CM | POA: Diagnosis not present

## 2018-04-04 LAB — POCT INR: INR: 4.2 — AB (ref 2.0–3.0)

## 2018-04-04 NOTE — Progress Notes (Signed)
Daily Session Note  Patient Details  Name: Christian Rasmussen MRN: [id number] Date of Birth: 03/07/1941 Referring Provider:     PULMONARY REHAB OTHER RESP ORIENTATION from 03/20/2018 in [place]  Referring Provider  [place]      Encounter Date: 04/04/2018  Check In: Session Check In - 04/04/18 1045      Check-In   Supervising physician immediately available to respond to emergencies  See telemetry face sheet for immediately available MD    Location  [facility]    Staff Present  Russella Dar, MS, EP, [facility], Exercise Physiologist;Maison Kestenbaum Zachery Conch, Exercise Physiologist    Medication changes reported      No    Fall or balance concerns reported     No    Warm-up and Cool-down  Performed as group-led instruction    Resistance Training Performed  Yes    VAD Patient?  No    PAD/SET Patient?  No      Pain Assessment   Currently in Pain?  No/denies    Pain Score  0-No pain    Multiple Pain Sites  No       Capillary Blood Glucose: Results for orders placed or performed in visit on 04/04/18 (from the past 24 hour(s))  POCT INR     Status: Abnormal   Collection Time: 04/04/18  8:00 AM  Result Value Ref Range   INR 4.2 (A) 2.0 - 3.0      Social History   Tobacco Use  Smoking Status Former Smoker  . Packs/day: 1.00  . Years: 28.00  . Pack years: 28.00  . Types: Cigarettes  . Start date: 10/21/1956  . Last attempt to quit: 05/10/1985  . Years since quitting: 32.9  Smokeless Tobacco Never Used    Goals Met:  Proper associated with RPD/PD & O2 Sat Independence with exercise equipment Improved SOB with ADL's Using PLB without cueing & demonstrates good technique Exercise tolerated well No report of cardiac concerns or symptoms Strength training completed today  Goals Unmet:  Not Applicable  Comments: Pt able to follow exercise prescription today without complaint.  Will continue to monitor for progression. Check out 11:45.   Dr.  Sinda Du is Medical Director for [facility].

## 2018-04-04 NOTE — Patient Instructions (Signed)
Hold coumadin tonight then decrease dose to 1 tablet daily except 1 1/2 tablets on Tuesdays, Thursdays and Saturdays Recheck in 2 weeks

## 2018-04-06 ENCOUNTER — Encounter (HOSPITAL_COMMUNITY): Payer: Medicare Other

## 2018-04-10 NOTE — Progress Notes (Signed)
Pulmonary Individual Treatment Plan  Patient Details  Name: Christian Rasmussen MRN: [id number] Date of Birth: 19-Apr-1941 Referring Provider:     PULMONARY REHAB OTHER RESP ORIENTATION from 03/20/2018 in [place]  Referring Provider  McQuaid      Initial Encounter Date:    PULMONARY REHAB OTHER RESP ORIENTATION from 03/20/2018 in [place]  Date  03/20/18      Visit Diagnosis: Centrilobular emphysema ([place])  Patient's Home Medications on Admission:   Current Outpatient Medications:  .  amLODipine (NORVASC) 10 MG tablet, Take 1 tablet (10 mg total) by mouth daily., Disp: 90 tablet, Rfl: 3 .  aspirin EC 81 MG tablet, Take 1 tablet (81 mg total) by mouth daily., Disp: 90 tablet, Rfl: 3 .  ASTAXANTHIN PO, Take 10 mg by mouth daily. , Disp: , Rfl:  .  atorvastatin (LIPITOR) 40 MG tablet, Take 40 mg by mouth daily. , Disp: , Rfl:  .  carvedilol (COREG) 6.25 MG tablet, TAKE 2 TABLETS TWICE DAILY, Disp: 360 tablet, Rfl: 3 .  Coenzyme Q10 200 MG capsule, Take 200 mg by mouth daily. , Disp: , Rfl:  .  furosemide (LASIX) 40 MG tablet, Take 1 tablet (40 mg total) by mouth 2 (two) times daily., Disp: 180 tablet, Rfl: 3 .  lisinopril (PRINIVIL,ZESTRIL) 20 MG tablet, Take 1 tablet (20 mg total) by mouth daily., Disp: 90 tablet, Rfl: 3 .  metFORMIN (GLUCOPHAGE) 500 MG tablet, Take 1,000 mg by mouth 2 (two) times daily with a meal. , Disp: , Rfl: 2 .  potassium chloride (KLOR-CON 10) 10 MEQ tablet, Take 1 tablet (10 mEq total) by mouth 2 (two) times daily., Disp: 180 tablet, Rfl: 3 .  warfarin (COUMADIN) 5 MG tablet, Take 1 1/2 tablets daily except 1 tablet on Mondays and Fridays, Disp: 135 tablet, Rfl: 3  Past Medical History: Past Medical History:  Diagnosis Date  . 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 Aortic stenosis 10/14/2015  . CAD (coronary artery disease)    status post Taxus stent patency of RCA 2004, Cardiolite negative for  ischemia in 2010.  . Diabetes mellitus ([employer])   . Dyslipidemia   . History of kidney stones   . Hypertension   . Iron deficiency anemia 02/02/2018  . Mitral regurgitation 10/15/2015   Moderate-severe by intra-operative TEE  . Overweight   . Right bundle branch block (RBBB) with left anterior hemiblock   . 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 Snores   . Typical atrial flutter [facility])    s/p ablation 11-23-2012 by Dr Rayann Heman    Tobacco Use: Social History   Tobacco Use  Smoking Status Former Smoker  . Packs/day: 1.00  . Years: 28.00  . Pack years: 28.00  . Types: Cigarettes  . Start date: 10/21/1956  . Last attempt to quit: 05/10/1985  . Years since quitting: 32.9  Smokeless Tobacco Never Used    Labs: Recent Chemical engineer    Labs for ITP Cardiac and Pulmonary Rehab Latest Ref Rng & Units 10/16/2015 10/16/2015 10/16/2015 10/16/2015 10/18/2015   Cholestrol 0 - 200 mg/dL - - - - -   LDLCALC  0 - 99 mg/dL - - - - -   HDL >40 mg/dL - - - - -   Trlycerides <150 mg/dL - - - - -   Hemoglobin A1c 4.8 - 5.6 % - - - - -   PHART 7.350 - 7.450 - 7.394 7.415 - -   PCO2ART 35.0 - 45.0 mmHg - 35.5 36.2 - -   HCO3 20.0 - 24.0 mEq/L - 21.6 23.2 - -   TCO2 0 - 100 mmol/L 22 23 24 20 26    ACIDBASEDEF 0.0 - 2.0 mmol/L - 3.0(H) 1.0 - -   O2SAT % - 96.0 97.0 - -      Capillary Blood Glucose: Lab Results  Component Value Date   GLUCAP 122 (H) 09/16/2017   GLUCAP 168 (H) 03/20/2016   GLUCAP 94 03/19/2016   GLUCAP 114 (H) 03/19/2016   GLUCAP 96 03/19/2016     Pulmonary Assessment Scores: Pulmonary Assessment Scores    Row Name 03/20/18 1518         ADL UCSD   ADL Phase   Entry     SOB Score total  45     Rest  0     Walk  9     Stairs  2     Bath  1     Dress  1     Shop  0       CAT Score   CAT Score  16       mMRC Score   mMRC Score  3        Pulmonary Function Assessment: Pulmonary Function Assessment - 03/20/18 1515      Pulmonary Function Tests   FVC%  54 %    FEV1%  54 %    FEV1/FVC Ratio  73    RV%  4.37 %    DLCO%  13.97 %      Initial Spirometry Results   FVC%  57 %    FEV1%  57 %    FEV1/FVC Ratio  73      Post Bronchodilator Spirometry Results   FVC%  57 %    FEV1%  57 %    FEV1/FVC Ratio  73      Breath   Bilateral Breath Sounds  Clear    Shortness of Breath  Yes;Limiting activity       Exercise Target Goals: Exercise Program Goal: Individual exercise prescription set using results from initial 6 min walk test and THRR while considering  patient's activity barriers and safety.   Exercise Prescription Goal: Initial exercise prescription builds to 30-45 minutes a day of aerobic activity, 2-3 days per week.  Home exercise guidelines will be given to patient during program as part of exercise prescription that the participant will acknowledge.  Activity Barriers & Risk Stratification: Activity Barriers & Cardiac Risk Stratification - 03/20/18 1514      Activity Barriers & Cardiac Risk Stratification   Cardiac Risk Stratification  High       6 Minute Walk: 6 Minute Walk    Row Name 03/20/18 1353         6 Minute Walk   Phase  Initial     Distance  800 feet     Walk Time  5.41 minutes     # of Rest Breaks  1     MPH  1.51     METS  2.16     RPE  13     Perceived Dyspnea  13     VO2 Peak  6.65     Symptoms  No     Resting HR  83 bpm     Resting BP  108/60     Resting Oxygen Saturation   94 %     Exercise Oxygen Saturation  during 6 min walk  89 %     Max Ex. HR  115 bpm     Max Ex. BP  144/72     2 Minute Post BP  122/70        Oxygen Initial Assessment: Oxygen Initial Assessment - 03/20/18  1514      Home Oxygen   Home Oxygen Device  None       Oxygen Re-Evaluation:   Oxygen Discharge (Final Oxygen Re-Evaluation):   Initial Exercise Prescription: Initial Exercise Prescription - 03/20/18 1400      Date of Initial Exercise RX and Referring Provider   Date  03/20/18    Referring Provider  McQuaid    Expected Discharge Date  06/20/18      NuStep   Level  1    SPM  75    Minutes  17    METs  2.2      Recumbant Elliptical   Level  1    RPM  44    Watts  50    Minutes  17    METs  2.9      Prescription Details   Frequency (times per week)  2    Duration  Progress to 30 minutes of continuous aerobic without signs/symptoms of physical distress      Intensity   THRR 40-80% of Max Heartrate  107-120-132    Ratings of Perceived Exertion  11-13    Perceived Dyspnea  0-4      Progression   Progression  Continue progressive overload as per policy without signs/symptoms or physical distress.      Resistance Training   Training Prescription  Yes    Weight  1    Reps  10-15       Perform Capillary Blood Glucose checks as needed.  Exercise Prescription Changes:   Exercise Comments:  Exercise Comments    Row Name 04/04/18 1023           Exercise Comments  Patient recently began program. He has finished one complete exercise session. We will monitor and progress as tolerated.           Exercise Goals and Review:  Exercise Goals    Row Name 03/20/18 1355             Exercise Goals   Increase Physical Activity  Yes       Intervention  Provide advice, education, support and counseling about physical activity/exercise needs.;Develop an individualized exercise prescription for aerobic and resistive training based on initial evaluation findings, risk stratification, comorbidities and participant's personal goals.       Expected Outcomes  Short Term: Attend rehab on a regular basis to increase amount of physical activity.       Increase Strength  and Stamina  Yes       Intervention  Provide advice, education, support and counseling about physical activity/exercise needs.;Develop an individualized exercise prescription for aerobic and resistive training based on initial evaluation findings, risk stratification, comorbidities and participant's personal goals.       Expected Outcomes  Short Term: Increase workloads from initial exercise prescription for resistance, speed, and METs.  Able to understand and use rate of perceived exertion (RPE) scale  Yes       Intervention  Provide education and explanation on how to use RPE scale       Expected Outcomes  Short Term: Able to use RPE daily in rehab to express subjective intensity level;Long Term:  Able to use RPE to guide intensity level when exercising independently       Able to understand and use Dyspnea scale  Yes       Intervention  Provide education and explanation on how to use Dyspnea scale       Expected Outcomes  Short Term: Able to use Dyspnea scale daily in rehab to express subjective sense of shortness of breath during exertion;Long Term: Able to use Dyspnea scale to guide intensity level when exercising independently       Knowledge and understanding of Target Heart Rate Range (THRR)  Yes       Intervention  Provide education and explanation of THRR including how the numbers were predicted and where they are located for reference       Expected Outcomes  Short Term: Able to state/look up THRR;Long Term: Able to use THRR to govern intensity when exercising independently;Short Term: Able to use daily as guideline for intensity in rehab       Able to check pulse independently  Yes       Intervention  Provide education and demonstration on how to check pulse in carotid and radial arteries.;Review the importance of being able to check your own pulse for safety during independent exercise       Expected Outcomes  Short Term: Able to explain why pulse checking is important during  independent exercise;Long Term: Able to check pulse independently and accurately       Understanding of Exercise Prescription  Yes       Intervention  Provide education, explanation, and written materials on patient's individual exercise prescription       Expected Outcomes  Short Term: Able to explain program exercise prescription;Long Term: Able to explain home exercise prescription to exercise independently          Exercise Goals Re-Evaluation : Exercise Goals Re-Evaluation    Row Name 04/04/18 1022             Exercise Goal Re-Evaluation   Exercise Goals Review  Increase Strength and Stamina;Increase Physical Activity;Able to understand and use rate of perceived exertion (RPE) scale;Knowledge and understanding of Target Heart Rate Range (THRR);Understanding of Exercise Prescription;Able to check pulse independently;Able to understand and use Dyspnea scale       Comments  Patient has just recently began the program, he has had one full exercise visit. We will continue to monitor and progress as tolerated.        Expected Outcomes  Increase activity level.           Discharge Exercise Prescription (Final Exercise Prescription Changes):   Nutrition:  Target Goals: Understanding of nutrition guidelines, daily intake of sodium 1500mg , cholesterol 200mg , calories 30% from fat and 7% or less from saturated fats, daily to have 5 or more servings of fruits and vegetables.  Biometrics: Pre Biometrics - 03/20/18 1355      Pre Biometrics   Height  5\' 11"  (1.803 m)    Waist Circumference  44 inches    Hip Circumference  43.5 inches    Waist to Hip Ratio  1.01 %    Triceps Skinfold  10 mm    %  Body Fat  28.6 %    Grip Strength  18.7 kg    Single Leg Stand  1.5 seconds        Nutrition Therapy Plan and Nutrition Goals: Nutrition Therapy & Goals - 03/20/18 1459      Personal Nutrition Goals   Personal Goal #2  Patient is working on eating heart healthy diet.     Additional  Goals?  No       Nutrition Assessments: Nutrition Assessments - 03/20/18 1459      MEDFICTS Scores   Pre Score  67       Nutrition Goals Re-Evaluation:   Nutrition Goals Discharge (Final Nutrition Goals Re-Evaluation):   Psychosocial: Target Goals: Acknowledge presence or absence of significant depression and/or stress, maximize coping skills, provide positive support system. Participant is able to verbalize types and ability to use techniques and skills needed for reducing stress and depression.  Initial Review & Psychosocial Screening: Initial Psych Review & Screening - 03/20/18 1456      Initial Review   Current issues with  None Identified      Family Dynamics   Good Support System?  Yes    Concerns  Recent loss of child   Patient seems to be coping with this tragedy.     Barriers   Psychosocial barriers to participate in program  There are no identifiable barriers or psychosocial needs.      Screening Interventions   Interventions  Encouraged to exercise    Expected Outcomes  Short Term goal: Identification and review with participant of any Quality of Life or Depression concerns found by scoring the questionnaire.;Long Term goal: The participant improves quality of Life and PHQ9 Scores as seen by post scores and/or verbalization of changes       Quality of Life Scores: Quality of Life - 03/20/18 1356      Quality of Life   Select  Quality of Life      Quality of Life Scores   Health/Function Pre  23.96 %    Socioeconomic Pre  25.5 %    Psych/Spiritual Pre  25.6 %    Family Pre  30 %    GLOBAL Pre  25.23 %      Scores of 19 and below usually indicate a poorer quality of life in these areas.  A difference of  2-3 points is a clinically meaningful difference.  A difference of 2-3 points in the total score of the Quality of Life Index has been associated with significant improvement in overall quality of life, self-image, physical symptoms, and general health  in studies assessing change in quality of life.   PHQ-9: Recent Review Flowsheet Data    Depression screen [facility] 2/9 03/20/2018 11/26/2015   Decreased Interest 0 0   Down, Depressed, Hopeless 0 0   PHQ - 2 Score 0 0   Altered sleeping 0 0   Tired, decreased energy 0 1   Change in appetite 0 1   Feeling bad or failure about yourself  1 -   Trouble concentrating 0 0   Moving slowly or fidgety/restless 0 0   Suicidal thoughts 0 0   PHQ-9 Score 1 2   Difficult doing work/chores Not difficult at all Not difficult at all     Interpretation of Total Score  Total Score Depression Severity:  1-4 = Minimal depression, 5-9 = Mild depression, 10-14 = Moderate depression, 15-19 = Moderately severe depression, 20-27 = Severe depression   Psychosocial  Evaluation and Intervention: Psychosocial Evaluation - 03/20/18 1458      Psychosocial Evaluation & Interventions   Interventions  Encouraged to exercise with the program and follow exercise prescription    Continue Psychosocial Services   No Follow up required       Psychosocial Re-Evaluation:   Psychosocial Discharge (Final Psychosocial Re-Evaluation):    Education: Education Goals: Education classes will be provided on a weekly basis, covering required topics. Participant will state understanding/return demonstration of topics presented.  Learning Barriers/Preferences: Learning Barriers/Preferences - 03/20/18 1447      Learning Barriers/Preferences   Learning Barriers  None    Learning Preferences  Individual Instruction;Group Instruction;Skilled Demonstration       Education Topics: How Lungs Work and Diseases: - Discuss the anatomy of the lungs and diseases that can affect the lungs, such as COPD.   Exercise: -Discuss the importance of exercise, FITT principles of exercise, normal and abnormal responses to exercise, and how to exercise safely.   Environmental Irritants: -Discuss types of environmental irritants and how  to limit exposure to environmental irritants.   Meds/Inhalers and oxygen: - Discuss respiratory medications, definition of an inhaler and oxygen, and the proper way to use an inhaler and oxygen.   Energy Saving Techniques: - Discuss methods to conserve energy and decrease shortness of breath when performing activities of daily living.    Bronchial Hygiene / Breathing Techniques: - Discuss breathing mechanics, pursed-lip breathing technique,  proper posture, effective ways to clear airways, and other functional breathing techniques   Cleaning Equipment: - Provides group verbal and written instruction about the health risks of elevated stress, cause of high stress, and healthy ways to reduce stress.   Nutrition I: Fats: - Discuss the types of cholesterol, what cholesterol does to the body, and how cholesterol levels can be controlled.   Nutrition II: Labels: -Discuss the different components of food labels and how to read food labels.   Respiratory Infections: - Discuss the signs and symptoms of respiratory infections, ways to prevent respiratory infections, and the importance of seeking medical treatment when having a respiratory infection.   Stress I: Signs and Symptoms: - Discuss the causes of stress, how stress may lead to anxiety and depression, and ways to limit stress.   Stress II: Relaxation: -Discuss relaxation techniques to limit stress.   Oxygen for Home/Travel: - Discuss how to prepare for travel when on oxygen and proper ways to transport and store oxygen to ensure safety.   Knowledge Questionnaire Score: Knowledge Questionnaire Score - 03/20/18 1448      Knowledge Questionnaire Score   Pre Score  15/17       Core Components/Risk Factors/Patient Goals at Admission: Personal Goals and Risk Factors at Admission - 03/20/18 1459      Core Components/Risk Factors/Patient Goals on Admission    Weight Management  Weight Maintenance    Personal Goal Other   Yes    Personal Goal  Increase his activity    Intervention  Come to [facility] 3 days/week and supplement with 2 more days at home.     Expected Outcomes  To reach personal goals.        Core Components/Risk Factors/Patient Goals Review:    Core Components/Risk Factors/Patient Goals at Discharge (Final Review):    ITP Comments: ITP Comments    Row Name 03/20/18 1512 04/10/18 1415         ITP Comments  Patient has been in our program before with his heart. He is returning  with emphysema. He is now retired and plans to complete the program this time.   Patient is new to program. He has completed 3 sessions. Will continue to monitor for progress.          Comments: ITP REVIEW Patient is new to program. He has completed 3 sessions. Will continue to monitor for progress.

## 2018-04-11 ENCOUNTER — Inpatient Hospital Stay (HOSPITAL_COMMUNITY): Payer: Medicare Other | Attending: Hematology

## 2018-04-11 ENCOUNTER — Encounter (HOSPITAL_COMMUNITY)
Admission: RE | Admit: 2018-04-11 | Discharge: 2018-04-11 | Disposition: A | Discharge status: Home / Self Care | Payer: Medicare Other | Source: Ambulatory Visit | Attending: Pulmonary Disease | Admitting: Pulmonary Disease

## 2018-04-11 ENCOUNTER — Other Ambulatory Visit (HOSPITAL_COMMUNITY): Payer: Medicare Other

## 2018-04-11 DIAGNOSIS — I2510 Atherosclerotic heart disease of native coronary artery without angina pectoris: Secondary | ICD-10-CM | POA: Diagnosis not present

## 2018-04-11 DIAGNOSIS — J432 Centrilobular emphysema: Secondary | ICD-10-CM

## 2018-04-11 DIAGNOSIS — Z87891 Personal history of nicotine dependence: Secondary | ICD-10-CM | POA: Insufficient documentation

## 2018-04-11 DIAGNOSIS — E119 Type 2 diabetes mellitus without complications: Secondary | ICD-10-CM | POA: Insufficient documentation

## 2018-04-11 DIAGNOSIS — Z951 Presence of aortocoronary bypass graft: Secondary | ICD-10-CM | POA: Diagnosis not present

## 2018-04-11 DIAGNOSIS — J90 Pleural effusion, not elsewhere classified: Secondary | ICD-10-CM | POA: Insufficient documentation

## 2018-04-11 DIAGNOSIS — D509 Iron deficiency anemia, unspecified: Secondary | ICD-10-CM | POA: Insufficient documentation

## 2018-04-11 DIAGNOSIS — Z7901 Long term (current) use of anticoagulants: Secondary | ICD-10-CM | POA: Diagnosis not present

## 2018-04-11 DIAGNOSIS — D508 Other iron deficiency anemias: Secondary | ICD-10-CM

## 2018-04-11 DIAGNOSIS — Z79899 Other long term (current) drug therapy: Secondary | ICD-10-CM | POA: Diagnosis not present

## 2018-04-11 DIAGNOSIS — I10 Essential (primary) hypertension: Secondary | ICD-10-CM | POA: Diagnosis not present

## 2018-04-11 DIAGNOSIS — Z7982 Long term (current) use of aspirin: Secondary | ICD-10-CM | POA: Insufficient documentation

## 2018-04-11 DIAGNOSIS — Z7984 Long term (current) use of oral hypoglycemic drugs: Secondary | ICD-10-CM | POA: Diagnosis not present

## 2018-04-11 LAB — CBC WITH DIFFERENTIAL/PLATELET
ABS IMMATURE GRANULOCYTES: 0.03 10*3/uL (ref 0.00–0.07)
BASOS ABS: 0.1 10*3/uL (ref 0.0–0.1)
BASOS PCT: 1 %
Eosinophils Absolute: 0.2 10*3/uL (ref 0.0–0.5)
Eosinophils Relative: 2 %
HEMATOCRIT: 40.6 % (ref 39.0–52.0)
Hemoglobin: 12.8 g/dL — ABNORMAL LOW (ref 13.0–17.0)
IMMATURE GRANULOCYTES: 0 %
LYMPHS ABS: 0.6 10*3/uL — AB (ref 0.7–4.0)
Lymphocytes Relative: 6 %
MCH: 29.6 pg (ref 26.0–34.0)
MCHC: 31.5 g/dL (ref 30.0–36.0)
MCV: 93.8 fL (ref 80.0–100.0)
Monocytes Absolute: 0.9 10*3/uL (ref 0.1–1.0)
Monocytes Relative: 9 %
NEUTROS ABS: 8.6 10*3/uL — AB (ref 1.7–7.7)
NEUTROS PCT: 82 %
NRBC: 0 % (ref 0.0–0.2)
PLATELETS: 279 10*3/uL (ref 150–400)
RBC: 4.33 MIL/uL (ref 4.22–5.81)
RDW: 16.8 % — AB (ref 11.5–15.5)
WBC: 10.4 10*3/uL (ref 4.0–10.5)

## 2018-04-11 LAB — LACTATE DEHYDROGENASE: LDH: 166 U/L (ref 98–192)

## 2018-04-11 LAB — COMPREHENSIVE METABOLIC PANEL
ALBUMIN: 4.1 g/dL (ref 3.5–5.0)
ALT: 14 U/L (ref 0–44)
AST: 15 U/L (ref 15–41)
Alkaline Phosphatase: 84 U/L (ref 38–126)
Anion gap: 9 (ref 5–15)
BUN: 29 mg/dL — AB (ref 8–23)
CHLORIDE: 107 mmol/L (ref 98–111)
CO2: 23 mmol/L (ref 22–32)
Calcium: 9.6 mg/dL (ref 8.9–10.3)
Creatinine, Ser: 1.12 mg/dL (ref 0.61–1.24)
GFR calc Af Amer: >60 mL/min (ref >60)
GFR calc non Af Amer: >60 mL/min (ref >60)
GLUCOSE: 89 mg/dL (ref 70–99)
POTASSIUM: 5 mmol/L (ref 3.5–5.1)
Sodium: 139 mmol/L (ref 135–145)
Total Bilirubin: 0.8 mg/dL (ref 0.3–1.2)
Total Protein: 7.8 g/dL (ref 6.5–8.1)

## 2018-04-11 LAB — FERRITIN: Ferritin: 213 ng/mL (ref 24–336)

## 2018-04-11 NOTE — Progress Notes (Signed)
Daily Session Note  Patient Details  Name: Christian Rasmussen MRN: [id number] Date of Birth: [birth year] Referring Provider:     PULMONARY REHAB OTHER RESP ORIENTATION from 03/20/2018 in [place]  Referring Provider  McQuaid      Encounter Date: 04/11/2018  Check In: Session Check In - 04/11/18 1045      Check-In   Supervising physician immediately available to respond to emergencies  See telemetry face sheet for immediately available MD    Location  [facility]    Staff Present  Russella Dar, MS, EP, [facility], Exercise Physiologist;Mccade Sullenberger Zachery Conch, Exercise Physiologist    Medication changes reported      No    Fall or balance concerns reported     No    Warm-up and Cool-down  Performed as group-led instruction    Resistance Training Performed  Yes    VAD Patient?  No    PAD/SET Patient?  No      Pain Assessment   Currently in Pain?  No/denies    Pain Score  0-No pain    Multiple Pain Sites  No       Capillary Blood Glucose: No results found for this or any previous visit (from the past 24 hour(s)).    Social History   Tobacco Use  Smoking Status Former Smoker  . Packs/day: 1.00  . Years: 28.00  . Pack years: 28.00  . Types: Cigarettes  . Start date: 10/21/1956  . Last attempt to quit: 05/10/1985  . Years since quitting: 32.9  Smokeless Tobacco Never Used    Goals Met:  Proper associated with RPD/PD & O2 Sat Independence with exercise equipment Using PLB without cueing & demonstrates good technique Exercise tolerated well No report of cardiac concerns or symptoms Strength training completed today  Goals Unmet:  Not Applicable  Comments: Pt able to follow exercise prescription today without complaint.  Will continue to monitor for progression. Check out 11:45.   Dr. Sinda Du is Medical Director for [facility] Pulmonary Rehab.

## 2018-04-13 ENCOUNTER — Encounter (HOSPITAL_COMMUNITY)
Admission: RE | Admit: 2018-04-13 | Discharge: 2018-04-13 | Disposition: A | Discharge status: Home / Self Care | Payer: Medicare Other | Source: Ambulatory Visit | Attending: Pulmonary Disease | Admitting: Pulmonary Disease

## 2018-04-13 DIAGNOSIS — J432 Centrilobular emphysema: Secondary | ICD-10-CM | POA: Diagnosis not present

## 2018-04-13 NOTE — Progress Notes (Signed)
Daily Session Note  Patient Details  Name: Christian Rasmussen MRN: [id number] Date of Birth: July 07, 1940 Referring Provider:     PULMONARY REHAB OTHER RESP ORIENTATION from 03/20/2018 in [place]  Referring Provider  McQuaid      Encounter Date: 04/13/2018  Check In: Session Check In - 04/13/18 1045      Check-In   Supervising physician immediately available to respond to emergencies  See telemetry face sheet for immediately available MD    Location  [facility]    Staff Present  Benay Pike, Exercise Physiologist;Berlene Dixson Wynetta Emery, RN, BSN    Medication changes reported      No    Fall or balance concerns reported     No    Warm-up and Cool-down  Performed as group-led instruction    Resistance Training Performed  Yes    VAD Patient?  No    PAD/SET Patient?  No      Pain Assessment   Currently in Pain?  No/denies    Pain Score  0-No pain    Multiple Pain Sites  No       Capillary Blood Glucose: No results found for this or any previous visit (from the past 24 hour(s)).    Social History   Tobacco Use  Smoking Status Former Smoker  . Packs/day: 1.00  . Years: 28.00  . Pack years: 28.00  . Types: Cigarettes  . Start date: 10/21/1956  . Last attempt to quit: 05/10/1985  . Years since quitting: 32.9  Smokeless Tobacco Never Used    Goals Met:  Proper associated with RPD/PD & O2 Sat Independence with exercise equipment Improved SOB with ADL's Using PLB without cueing & demonstrates good technique Exercise tolerated well No report of cardiac concerns or symptoms Strength training completed today  Goals Unmet:  Not Applicable  Comments: Pt able to follow exercise prescription today without complaint.  Will continue to monitor for progression. Check out 1145.   Dr. Sinda Du is Medical Director for [facility] Pulmonary Rehab.

## 2018-04-17 ENCOUNTER — Inpatient Hospital Stay (HOSPITAL_BASED_OUTPATIENT_CLINIC_OR_DEPARTMENT_OTHER): Payer: Medicare Other | Admitting: Internal Medicine

## 2018-04-17 ENCOUNTER — Encounter (HOSPITAL_COMMUNITY): Payer: Self-pay | Admitting: Internal Medicine

## 2018-04-17 ENCOUNTER — Other Ambulatory Visit: Payer: Self-pay

## 2018-04-17 VITALS — BP 124/60 | HR 89 | Temp 97.8°F | Resp 16 | Wt 216.4 lb

## 2018-04-17 DIAGNOSIS — E119 Type 2 diabetes mellitus without complications: Secondary | ICD-10-CM | POA: Diagnosis not present

## 2018-04-17 DIAGNOSIS — I10 Essential (primary) hypertension: Secondary | ICD-10-CM | POA: Diagnosis not present

## 2018-04-17 DIAGNOSIS — J90 Pleural effusion, not elsewhere classified: Secondary | ICD-10-CM | POA: Diagnosis not present

## 2018-04-17 DIAGNOSIS — Z7984 Long term (current) use of oral hypoglycemic drugs: Secondary | ICD-10-CM

## 2018-04-17 DIAGNOSIS — D509 Iron deficiency anemia, unspecified: Secondary | ICD-10-CM

## 2018-04-17 DIAGNOSIS — I2510 Atherosclerotic heart disease of native coronary artery without angina pectoris: Secondary | ICD-10-CM

## 2018-04-17 DIAGNOSIS — Z951 Presence of aortocoronary bypass graft: Secondary | ICD-10-CM | POA: Diagnosis not present

## 2018-04-17 DIAGNOSIS — Z7982 Long term (current) use of aspirin: Secondary | ICD-10-CM | POA: Diagnosis not present

## 2018-04-17 DIAGNOSIS — Z7901 Long term (current) use of anticoagulants: Secondary | ICD-10-CM

## 2018-04-17 DIAGNOSIS — Z87891 Personal history of nicotine dependence: Secondary | ICD-10-CM

## 2018-04-17 DIAGNOSIS — Z79899 Other long term (current) drug therapy: Secondary | ICD-10-CM | POA: Diagnosis not present

## 2018-04-17 DIAGNOSIS — R918 Other nonspecific abnormal finding of lung field: Secondary | ICD-10-CM

## 2018-04-17 DIAGNOSIS — D508 Other iron deficiency anemias: Secondary | ICD-10-CM

## 2018-04-17 NOTE — Progress Notes (Signed)
Diagnosis Other iron deficiency anemia - Plan: CBC with Differential/Platelet, Comprehensive metabolic panel, Lactate dehydrogenase, Ferritin  Abnormal findings on diagnostic imaging of lung - Plan: CT CHEST W CONTRAST  Staging Cancer Staging No matching staging information was found for the patient.  Assessment and Plan:  1.  Iron deficiency Anemia.  Pt was last  treated with IV iron on 02/10/2018.  Labs done 04/11/2018 reviewed and showed WBC 10 Hb 12.8 plts 279,000.  Chemistries WNL with K+ 5 Cr 1.12 and normal LFTs.  Ferritin is improved from 32 to 213 after IV iron.    Previous work-up showed normal haptoglobin, B12, folate and SPEP.   Pt will RTC in 08/2018 for follow-up and labs.    2.  Fatigue.  Pt had CTA done 02/01/2018 that showed no PE but pt had left and right Pleural effusions.  Pt should continue to follow-up with Dr. Lake Bells as directed.  He remains on coumadin.  Pt has been recommended for rehab by Dr. Lake Bells.    3.  Right Leg swelling.  This is likely related to his CABG surgery.  Dopplers done 02/02/2018 negative for DVT.    4.  Pulmonary nodules.  Patient had a CT of the chest done April 2019 that was reviewed and showed  Impression: 1. No evidence of interstitial lung disease. 2. Scattered small solid pulmonary nodules in the right lung, largest 5 mm. No follow-up needed if patient is low-risk (and has no known or suspected primary neoplasm). Non-contrast chest CT can be considered in 12 months if patient is high-risk. This recommendation follows the consensus statement: Guidelines for Management of Incidental Pulmonary Nodules Detected on CT Images:From the Fleischner Society 2017; published online before print (10.1148/radiol.5188416606). 3. Trace right and small left dependent pleural effusions. 4. Dilated main pulmonary artery, suggesting pulmonary arterial hypertension. 5. Mild interlobular septal thickening throughout the lungs, suggesting mild pulmonary  edema.  Aortic Atherosclerosis (ICD10-I70.0) and Emphysema (ICD10-J43.9).  Pt had a stable 4 mm right Pulmonary nodule.  Pt set up for CT chest in 08/2018 and he will follow-up at that time to go over results.    5.  CAD.  Follow-up with Cardiology as directed.    6.  HTN.  BP is 124/60.   Follow-up with PCP.    7.  Joint pain.  CRP, RF, Sed rate, SPEP all WNL.    Interval history.  Historical data obtained from noted dated 02/01/2018:  77 year old male referred for evaluation of anemia.  He has a history of coronary artery disease and has undergone CABG in the past.  He also has sleep apnea.  He has been seen recently by pulmonary.  He reports difficulty with ambulation and has extreme fatigue.  Labs done 01/18/2018 reviewed and showed white count 11.8 hemoglobin 12.2 platelets 398,000.  He had a normal differential.  Potassium was normal at 4.3 creatinine was 1.  Patient smoked in the past reportedly 2 packs/day for 15 years but has stopped smoking for more than 30 years.  He reports he had fluid on his lung and had some drained around the time he had his cardiac surgery.  He denies any blood in his stool or his urine.  He has never been told he had thyroid problems.  Patient is also reporting some swelling of his right leg.  This is the leg that the graft was taken from for his surgery.  He reports occasional joint symptoms.  He reports he takes Lasix.   Current Status:  Pt is seen today for follow-up to go over labs.  He reports he is going to pulmonary rehab.  Pt reports ongoing fatigue.    Problem List Patient Active Problem List   Diagnosis Date Noted  . Iron deficiency anemia [D50.9] 02/02/2018  . OSA (obstructive sleep apnea) [G47.33] 11/22/2017  . Aortic valve disease [I35.9]   . Dyspnea [R06.00] 08/23/2017  . Lung nodules [R91.8] 08/23/2017  . Hypoxia [R09.02]   . Pleural effusion, bilateral [J90]   . Acute diastolic CHF (congestive heart failure) ([place]) [I50.31] 03/19/2016  .  Edema extremities [R60.0] 12/24/2015  . 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 Mitral valve disease [I05.9] 10/15/2015  . Aortic insufficiency [I35.1] 10/15/2015  . Aortic stenosis [I35.0] 10/14/2015  . NSTEMI (non-ST elevated myocardial infarction) ([place]) [I21.4] 10/12/2015  . Unstable angina pectoris ([place]) [I20.0] 10/12/2015  . Mild aortic stenosis [I35.0] 04/13/2013  . Atrial flutter ([place]) [I48.92] 11/09/2012  . Snoring [R06.83] 11/09/2012  . 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 Hypertension [I10]   . CAD (coronary artery disease) [I25.10]   . DYSLIPIDEMIA [E78.5] 04/11/2008  . CORONARY ATHEROSCLEROSIS NATIVE CORONARY ARTERY [I25.10] 04/11/2008  . PERCUTANEOUS TRANSLUMINAL CORONARY ANGIOPLASTY, HX OF [Z98.61] 04/11/2008    Past Medical History Past Medical History:  Diagnosis Date  . 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 Aortic stenosis 10/14/2015  . CAD (coronary artery disease)    status post Taxus stent patency of RCA 2004, Cardiolite negative for ischemia in 2010.  . Diabetes mellitus ([place])   . Dyslipidemia   . History of kidney stones   . Hypertension   . Iron deficiency anemia 02/02/2018  . Mitral regurgitation 10/15/2015   Moderate-severe by intra-operative TEE  . Overweight   . Right bundle branch block (RBBB) with left anterior hemiblock   . 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 Snores   . Typical atrial flutter [facility])    s/p ablation 11-23-2012 by Dr Rayann Heman    Past Surgical History Past Surgical History:  Procedure Laterality Date  . ABLATION   11-23-2012   s/p cavotricuspid isthmus ablation by Dr Rayann Heman  . ANKLE SURGERY Left    total ankle replacement  . ATRIAL FLUTTER ABLATION N/A 11/23/2012   Procedure: ATRIAL FLUTTER ABLATION;  Surgeon: Thompson Grayer, MD;  Location: [facility] CATH LAB;  Service: Cardiovascular;  Laterality: N/A;  . CARDIAC CATHETERIZATION N/A 10/13/2015   Procedure: Left Heart Cath and Coronary Angiography;  Surgeon: Peter M Martinique, MD;  Location: [place] CV LAB;  Service: Cardiovascular;  Laterality: N/A;  . CATARACT EXTRACTION Right   . CATARACT EXTRACTION W/PHACO Left 09/13/2013   Procedure: CATARACT EXTRACTION PHACO AND INTRAOCULAR LENS PLACEMENT (IOC);  Surgeon: Tonny Branch, MD;  Location: [facility] ORS;  Service: Ophthalmology;  Laterality: Left;  CDE 9.38  . COLONOSCOPY N/A 10/10/2014   Procedure: COLONOSCOPY;  Surgeon: Rogene Houston, MD;  Location: [facility] ENDO SUITE;  Service: Endoscopy;  Laterality: N/A;  830  . CORONARY ANGIOPLASTY     3 stents  . CORONARY ARTERY BYPASS GRAFT N/A 10/15/2015  Procedure: CORONARY ARTERY BYPASS GRAFTING (CABG) X4 LIMA-LAD; SEQ SVG-PD-PL; SVG-OM1 ENDOSCOPIC GREATER SAPHENOUS VEIN HARVEST(EVH) BILAT LOWER EXTREM;  Surgeon: Rexene Alberts, MD;  Location: [place];  Service: Open Heart Surgery;  Laterality: N/A;  . EP IMPLANTABLE DEVICE N/A 10/20/2015   Procedure: Pacemaker Implant;  Surgeon: Evans Lance, MD;  Location: [place] CV LAB;  Service: Cardiovascular;  Laterality: N/A;  . MITRAL VALVE REPAIR N/A 10/15/2015   Procedure: MITRAL VALVE REPAIR (MVR), # 28 MEMO 3-D RING ANNULOPLASTY AND COMPLEX VALVE REPAIR;  Surgeon: Rexene Alberts, MD;  Location: [place];  Service: Open Heart Surgery;  Laterality: N/A;  . TEE WITHOUT CARDIOVERSION N/A 10/15/2015   Procedure: TRANSESOPHAGEAL ECHOCARDIOGRAM (TEE);  Surgeon: Rexene Alberts, MD;  Location: [place];  Service: Open Heart Surgery;  Laterality: N/A;  . TEE WITHOUT CARDIOVERSION N/A 09/16/2017   Procedure: TRANSESOPHAGEAL ECHOCARDIOGRAM (TEE);  Surgeon:  Dorothy Spark, MD;  Location: [facility] ENDOSCOPY;  Service: Cardiovascular;  Laterality: N/A;    Family History Family History  Problem Relation Age of Onset  . Other Father 28       Died from MI.  . Other Mother        alive & well  . Leukemia Brother 92       died     Social History  reports that he quit smoking about 32 years ago. His smoking use included cigarettes. He started smoking about 61 years ago. He has a 28.00 pack-year smoking history. He has never used smokeless tobacco. He reports that he drinks alcohol. He reports that he does not use drugs.  Medications  Current Outpatient Medications:  .  amLODipine (NORVASC) 10 MG tablet, Take 1 tablet (10 mg total) by mouth daily., Disp: 90 tablet, Rfl: 3 .  aspirin EC 81 MG tablet, Take 1 tablet (81 mg total) by mouth daily., Disp: 90 tablet, Rfl: 3 .  ASTAXANTHIN PO, Take 10 mg by mouth daily. , Disp: , Rfl:  .  atorvastatin (LIPITOR) 40 MG tablet, Take 40 mg by mouth daily. , Disp: , Rfl:  .  carvedilol (COREG) 6.25 MG tablet, TAKE 2 TABLETS TWICE DAILY, Disp: 360 tablet, Rfl: 3 .  Coenzyme Q10 200 MG capsule, Take 200 mg by mouth daily. , Disp: , Rfl:  .  furosemide (LASIX) 40 MG tablet, Take 1 tablet (40 mg total) by mouth 2 (two) times daily., Disp: 180 tablet, Rfl: 3 .  lisinopril (PRINIVIL,ZESTRIL) 20 MG tablet, Take 1 tablet (20 mg total) by mouth daily., Disp: 90 tablet, Rfl: 3 .  metFORMIN (GLUCOPHAGE) 500 MG tablet, Take 1,000 mg by mouth 2 (two) times daily with a meal. , Disp: , Rfl: 2 .  potassium chloride (KLOR-CON 10) 10 MEQ tablet, Take 1 tablet (10 mEq total) by mouth 2 (two) times daily., Disp: 180 tablet, Rfl: 3 .  warfarin (COUMADIN) 5 MG tablet, Take 1 1/2 tablets daily except 1 tablet on Mondays and Fridays, Disp: 135 tablet, Rfl: 3  Allergies Patient has no known allergies.  Review of Systems Review of Systems - Oncology ROS negative other than fatigue   Physical Exam  Vitals Wt Readings from  Last 3 Encounters:  04/17/18 216 lb 6.4 oz (98.2 kg)  03/30/18 217 lb 6.4 oz (98.6 kg)  03/20/18 217 lb (98.4 kg)   Temp Readings from Last 3 Encounters:  04/17/18 97.8 F (36.6 C) (Oral)  03/30/18 (!) 97.5 F (36.4 C)  02/16/18 (!) 97.3 F (36.3 C) (Oral)  BP Readings from Last 3 Encounters:  04/17/18 124/60  03/30/18 (!) 118/58  03/20/18 108/60   Pulse Readings from Last 3 Encounters:  04/17/18 89  03/30/18 (!) 101  03/20/18 83   Constitutional: Well-developed, well-nourished, and in no distress.   HENT: Head: Normocephalic and atraumatic.  Mouth/Throat: No oropharyngeal exudate. Mucosa moist. Eyes: Pupils are equal, round, and reactive to light. Conjunctivae are normal. No scleral icterus.  Neck: Normal range of motion. Neck supple. No JVD present.  Cardiovascular: Normal rate, regular rhythm and normal heart sounds.  Exam reveals no gallop and no friction rub.   No murmur heard. Pulmonary/Chest: Effort normal and breath sounds normal. No respiratory distress. No wheezes.No rales.  Abdominal: Soft. Bowel sounds are normal. No distension. There is no tenderness. There is no guarding.  Musculoskeletal: No edema or tenderness. Chronic RLE swelling.   Lymphadenopathy: No cervical, axillary or supraclavicular adenopathy.  Neurological: Alert and oriented to person, place, and time. No cranial nerve deficit.  Skin: Skin is warm and dry. No rash noted. No erythema. No pallor.  Psychiatric: Affect and judgment normal.   Labs No visits with results within 3 Day(s) from this visit.  Latest known visit with results is:  Appointment on 04/11/2018  Component Date Value Ref Range Status  . WBC 04/11/2018 10.4  4.0 - 10.5 K/uL Final  . RBC 04/11/2018 4.33  4.22 - 5.81 MIL/uL Final  . Hemoglobin 04/11/2018 12.8* 13.0 - 17.0 g/dL Final  . HCT 04/11/2018 40.6  39.0 - 52.0 % Final  . MCV 04/11/2018 93.8  80.0 - 100.0 fL Final  . MCH 04/11/2018 29.6  26.0 - 34.0 pg Final  . MCHC  04/11/2018 31.5  30.0 - 36.0 g/dL Final  . RDW 04/11/2018 16.8* 11.5 - 15.5 % Final  . Platelets 04/11/2018 279  150 - 400 K/uL Final  . nRBC 04/11/2018 0.0  0.0 - 0.2 % Final  . Neutrophils Relative % 04/11/2018 82  % Final  . Neutro Abs 04/11/2018 8.6* 1.7 - 7.7 K/uL Final  . Lymphocytes Relative 04/11/2018 6  % Final  . Lymphs Abs 04/11/2018 0.6* 0.7 - 4.0 K/uL Final  . Monocytes Relative 04/11/2018 9  % Final  . Monocytes Absolute 04/11/2018 0.9  0.1 - 1.0 K/uL Final  . Eosinophils Relative 04/11/2018 2  % Final  . Eosinophils Absolute 04/11/2018 0.2  0.0 - 0.5 K/uL Final  . Basophils Relative 04/11/2018 1  % Final  . Basophils Absolute 04/11/2018 0.1  0.0 - 0.1 K/uL Final  . Immature Granulocytes 04/11/2018 0  % Final  . Abs Immature Granulocytes 04/11/2018 0.03  0.00 - 0.07 K/uL Final   Performed at [facility], [address] [place], [place] [address]  . Sodium 04/11/2018 139  135 - 145 mmol/L Final  . Potassium 04/11/2018 5.0  3.5 - 5.1 mmol/L Final  . Chloride 04/11/2018 107  98 - 111 mmol/L Final  . CO2 04/11/2018 23  22 - 32 mmol/L Final  . Glucose, Bld 04/11/2018 89  70 - 99 mg/dL Final  . BUN 04/11/2018 29* 8 - 23 mg/dL Final  . Creatinine, Ser 04/11/2018 1.12  0.61 - 1.24 mg/dL Final  . Calcium 04/11/2018 9.6  8.9 - 10.3 mg/dL Final  . Total Protein 04/11/2018 7.8  6.5 - 8.1 g/dL Final  . Albumin 04/11/2018 4.1  3.5 - 5.0 g/dL Final  . AST 04/11/2018 15  15 - 41 U/L Final  . ALT 04/11/2018 14  0 - 44  U/L Final  . Alkaline Phosphatase 04/11/2018 84  38 - 126 U/L Final  . Total Bilirubin 04/11/2018 0.8  0.3 - 1.2 mg/dL Final  . GFR calc non Af Amer 04/11/2018 >60  >60 mL/min Final  . GFR calc Af Amer 04/11/2018 >60  >60 mL/min Final  . Anion gap 04/11/2018 9  5 - 15 Final   Performed at [facility], [address], [place], [place] [address]  . LDH 04/11/2018 166  98 - 192 U/L Final   Performed at [facility], [address], [place], [place] [address]  . Ferritin  04/11/2018 213  24 - 336 ng/mL Final   Performed at [facility], [address], [place], [place] [address]     Pathology Orders Placed This Encounter  Procedures  . CT CHEST W CONTRAST    Standing Status:   Future    Standing Expiration Date:   04/17/2019    Order Specific Question:   If indicated for the ordered procedure, I authorize the administration of contrast media per Radiology protocol    Answer:   Yes    Order Specific Question:   Preferred imaging location?    Answer:   [facility]    Order Specific Question:   Radiology Contrast Protocol - do NOT remove file path    Answer:   [email or web address]  . CBC with Differential/Platelet    Standing Status:   Future    Standing Expiration Date:   04/17/2020  . Comprehensive metabolic panel    Standing Status:   Future    Standing Expiration Date:   04/17/2020  . Lactate dehydrogenase    Standing Status:   Future    Standing Expiration Date:   04/17/2020  . Ferritin    Standing Status:   Future    Standing Expiration Date:   04/17/2020       Zoila Shutter MD

## 2018-04-18 ENCOUNTER — Ambulatory Visit (INDEPENDENT_AMBULATORY_CARE_PROVIDER_SITE_OTHER): Payer: Medicare Other | Admitting: *Deleted

## 2018-04-18 ENCOUNTER — Encounter (HOSPITAL_COMMUNITY)
Admission: RE | Admit: 2018-04-18 | Discharge: 2018-04-18 | Disposition: A | Discharge status: Home / Self Care | Payer: Medicare Other | Source: Ambulatory Visit | Attending: Pulmonary Disease | Admitting: Pulmonary Disease

## 2018-04-18 DIAGNOSIS — Z9889 Other specified postprocedural states: Secondary | ICD-10-CM | POA: Diagnosis not present

## 2018-04-18 DIAGNOSIS — J432 Centrilobular emphysema: Secondary | ICD-10-CM | POA: Diagnosis not present

## 2018-04-18 DIAGNOSIS — I4892 Unspecified atrial flutter: Secondary | ICD-10-CM

## 2018-04-18 DIAGNOSIS — I4891 Unspecified atrial fibrillation: Secondary | ICD-10-CM

## 2018-04-18 DIAGNOSIS — Z5181 Encounter for therapeutic drug level monitoring: Secondary | ICD-10-CM | POA: Diagnosis not present

## 2018-04-18 LAB — POCT INR: INR: 2.5 (ref 2.0–3.0)

## 2018-04-18 NOTE — Patient Instructions (Signed)
Continue coumadin 1 tablet daily except 1 1/2 tablets on Tuesdays, Thursdays and Saturdays Recheck in 2 weeks

## 2018-04-18 NOTE — Progress Notes (Signed)
Daily Session Note  Patient Details  Name: Christian Rasmussen MRN: [id number] Date of Birth: 09/20/1940 Referring Provider:     PULMONARY REHAB OTHER RESP ORIENTATION from 03/20/2018 in [place]  Referring Provider  [place]      Encounter Date: 04/18/2018  Check In: Session Check In - 04/18/18 1045      Check-In   Supervising physician immediately available to respond to emergencies  See telemetry face sheet for immediately available MD    Location  [facility]    Staff Present  Russella Dar, MS, EP, [facility], Exercise Physiologist;Ciria Bernardini Zachery Conch, Exercise Physiologist    Medication changes reported      No    Fall or balance concerns reported     No    Warm-up and Cool-down  Performed as group-led instruction    Resistance Training Performed  Yes    VAD Patient?  No    PAD/SET Patient?  No      Pain Assessment   Currently in Pain?  No/denies    Pain Score  0-No pain    Multiple Pain Sites  No       Capillary Blood Glucose: Results for orders placed or performed in visit on 04/18/18 (from the past 24 hour(s))  POCT INR     Status: Normal   Collection Time: 04/18/18  8:06 AM  Result Value Ref Range   INR 2.5 2.0 - 3.0      Social History   Tobacco Use  Smoking Status Former Smoker  . Packs/day: 1.00  . Years: 28.00  . Pack years: 28.00  . Types: Cigarettes  . Start date: 10/21/1956  . Last attempt to quit: 05/10/1985  . Years since quitting: 32.9  Smokeless Tobacco Never Used    Goals Met:  Proper associated with RPD/PD & O2 Sat Independence with exercise equipment Using PLB without cueing & demonstrates good technique Exercise tolerated well No report of cardiac concerns or symptoms Strength training completed today  Goals Unmet:  Not Applicable  Comments: Pt able to follow exercise prescription today without complaint.  Will continue to monitor for progression. Check out [phone].   Dr. Sinda Du is Medical Director  for [facility] Pulmonary Rehab.

## 2018-04-20 ENCOUNTER — Encounter (HOSPITAL_COMMUNITY): Payer: Medicare Other

## 2018-04-22 IMAGING — CR DG CHEST 2V
2 series · 2 of 2 positions shown · non-contrast
Comparison: PA and lateral chest x-ray November 10, 2015

CLINICAL DATA: Mitral valve repair and CABG on October 15, 2015, no
current chest complaints.

EXAM:
CHEST  2 VIEW

[w chest pa]
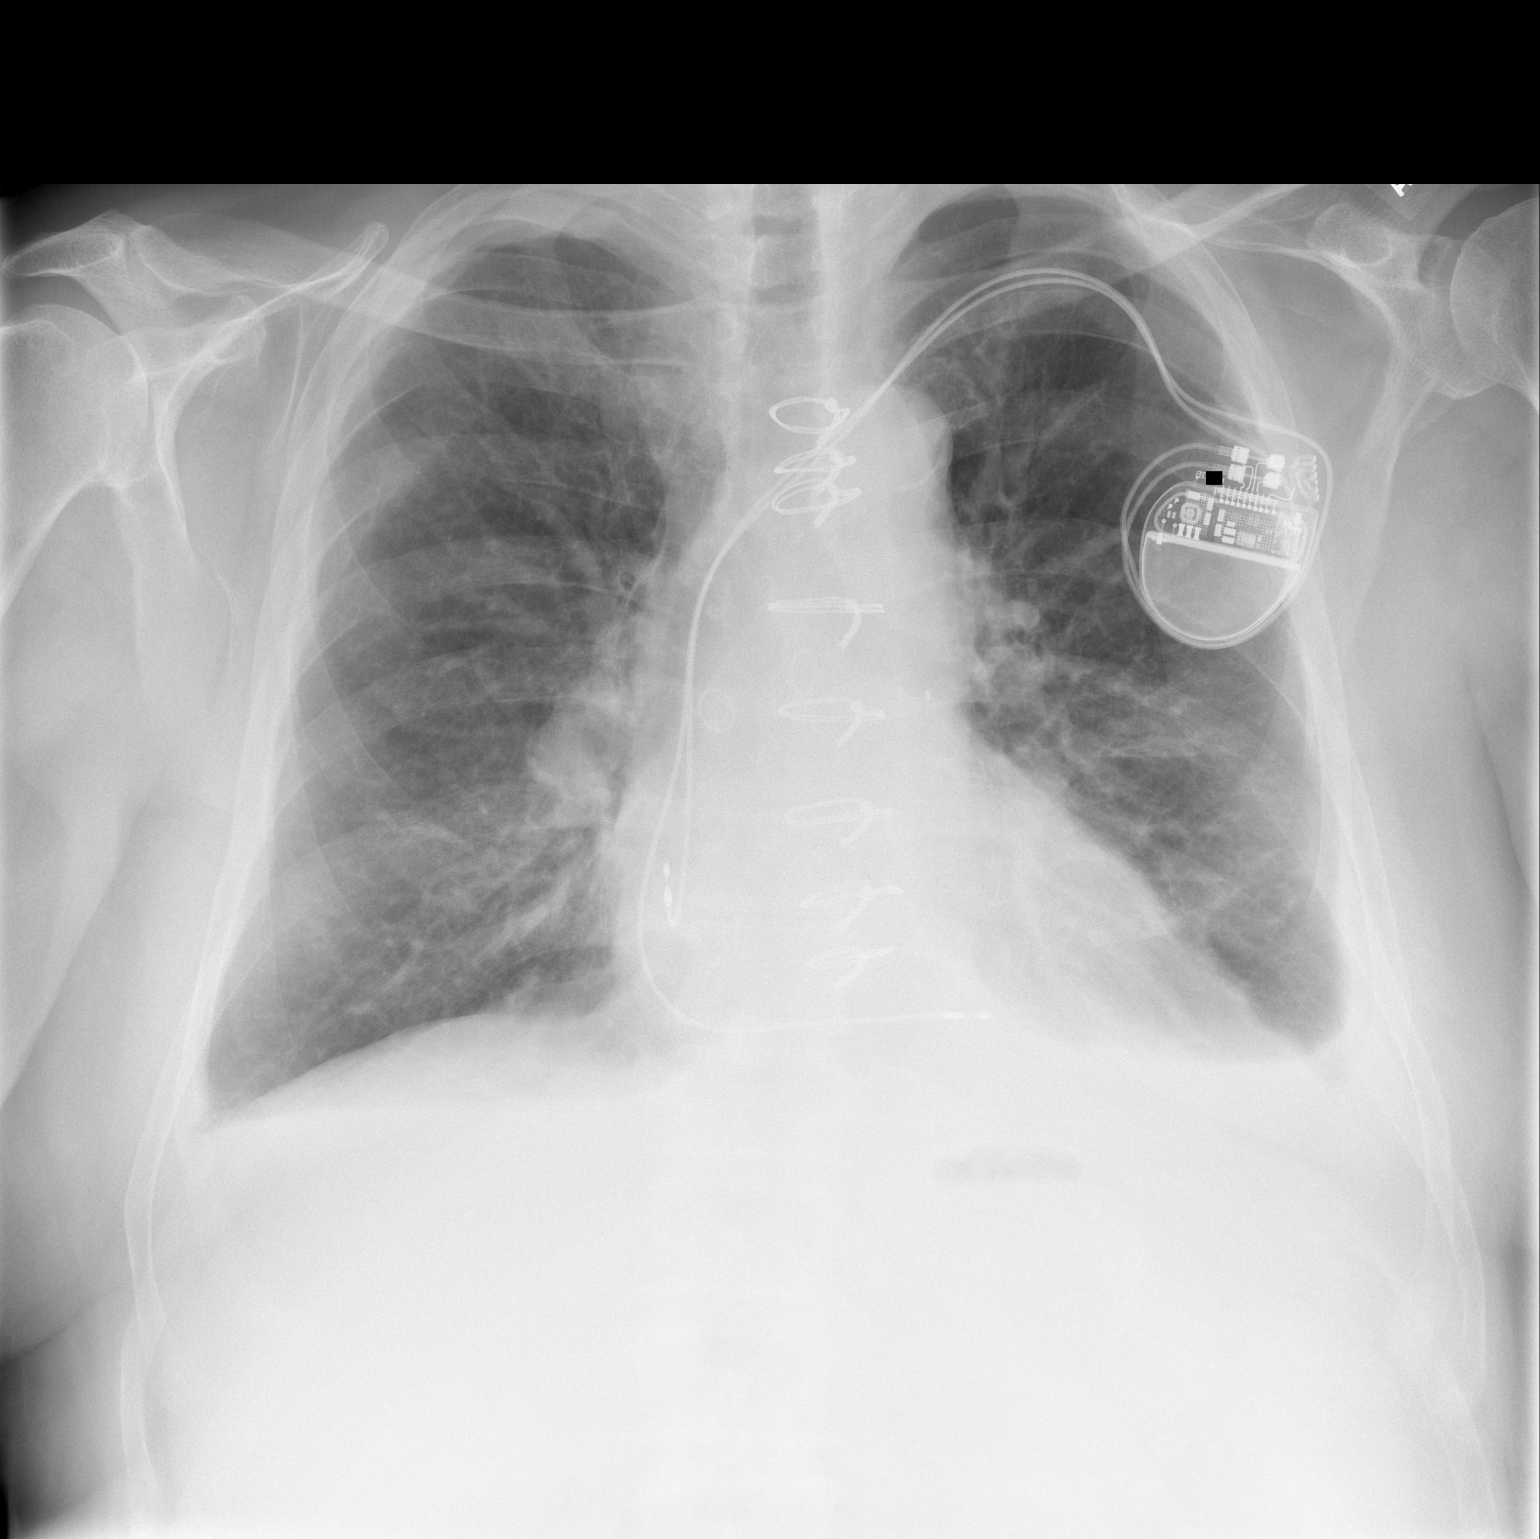

[w chest lat]
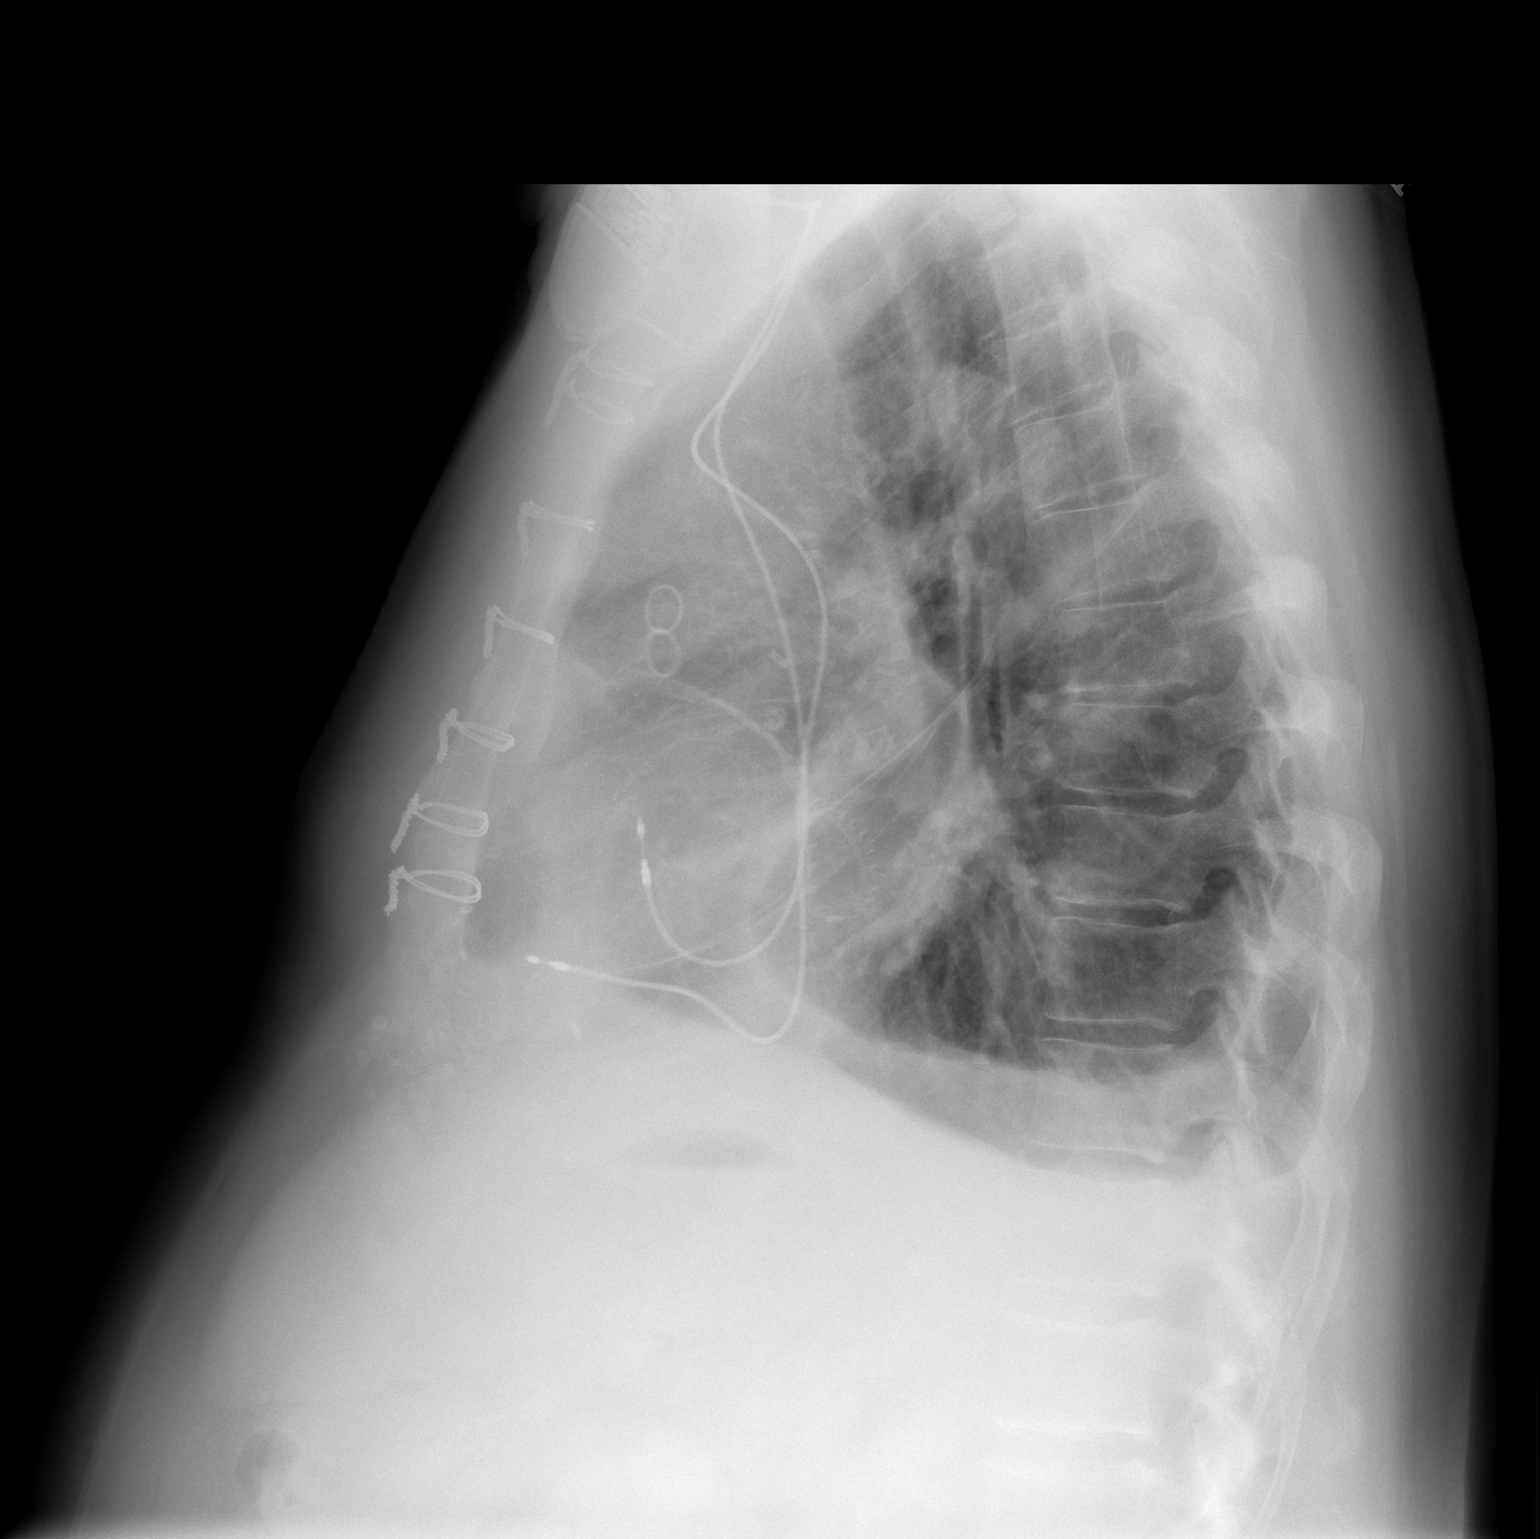

[2 of 2 positions shown; findings below may reference images not displayed]

FINDINGS: The lungs are well-expanded the interstitial markings are mildly
increased though improved over the previous study. There is a trace
of pleural fluid remaining on the right. There is a small stable
pleural effusion on the left. The heart is normal in size. The
mitral valve repair ring is visible. The central pulmonary
vascularity remains prominent. There are post CABG changes. The
sternal wires are intact.
IMPRESSION: Persistent small left pleural effusion and trace right pleural
effusion. Stable mild central pulmonary vascular prominence.
Probable underlying COPD or reactive airway disease.

## 2018-04-24 ENCOUNTER — Ambulatory Visit (INDEPENDENT_AMBULATORY_CARE_PROVIDER_SITE_OTHER): Payer: Medicare Other

## 2018-04-24 DIAGNOSIS — I442 Atrioventricular block, complete: Secondary | ICD-10-CM | POA: Diagnosis not present

## 2018-04-24 NOTE — Progress Notes (Signed)
Remote pacemaker transmission.   

## 2018-04-25 ENCOUNTER — Encounter (HOSPITAL_COMMUNITY)
Admission: RE | Admit: 2018-04-25 | Discharge: 2018-04-25 | Disposition: A | Discharge status: Home / Self Care | Payer: Medicare Other | Source: Ambulatory Visit | Attending: Pulmonary Disease | Admitting: Pulmonary Disease

## 2018-04-25 DIAGNOSIS — J069 Acute upper respiratory infection, unspecified: Secondary | ICD-10-CM | POA: Diagnosis not present

## 2018-04-25 DIAGNOSIS — Z6831 Body mass index (BMI) 31.0-31.9, adult: Secondary | ICD-10-CM | POA: Diagnosis not present

## 2018-04-25 DIAGNOSIS — R0989 Other specified symptoms and signs involving the circulatory and respiratory systems: Secondary | ICD-10-CM | POA: Diagnosis not present

## 2018-04-25 DIAGNOSIS — I4892 Unspecified atrial flutter: Secondary | ICD-10-CM | POA: Diagnosis not present

## 2018-04-25 DIAGNOSIS — Z299 Encounter for prophylactic measures, unspecified: Secondary | ICD-10-CM | POA: Diagnosis not present

## 2018-04-25 DIAGNOSIS — J90 Pleural effusion, not elsewhere classified: Secondary | ICD-10-CM | POA: Diagnosis not present

## 2018-04-25 DIAGNOSIS — J432 Centrilobular emphysema: Secondary | ICD-10-CM

## 2018-04-25 DIAGNOSIS — E1165 Type 2 diabetes mellitus with hyperglycemia: Secondary | ICD-10-CM | POA: Diagnosis not present

## 2018-04-25 DIAGNOSIS — R05 Cough: Secondary | ICD-10-CM | POA: Diagnosis not present

## 2018-04-25 DIAGNOSIS — H6123 Impacted cerumen, bilateral: Secondary | ICD-10-CM | POA: Diagnosis not present

## 2018-04-25 DIAGNOSIS — J439 Emphysema, unspecified: Secondary | ICD-10-CM | POA: Diagnosis not present

## 2018-04-25 DIAGNOSIS — Z87891 Personal history of nicotine dependence: Secondary | ICD-10-CM | POA: Diagnosis not present

## 2018-04-25 NOTE — Progress Notes (Signed)
Daily Session Note  Patient Details  Name: Christian Rasmussen MRN: [id number] Date of Birth: 1941-01-07 Referring Provider:     PULMONARY REHAB OTHER RESP ORIENTATION from 03/20/2018 in [place]  Referring Provider  [place]      Encounter Date: 04/25/2018  Check In: Session Check In - 04/25/18 1045      Check-In   Supervising physician immediately available to respond to emergencies  See telemetry face sheet for immediately available MD    Location  [facility]    Staff Present  Russella Dar, MS, EP, [facility], Exercise Physiologist;Kanda Deluna Zachery Conch, Exercise Physiologist    Medication changes reported      No    Fall or balance concerns reported     No    Warm-up and Cool-down  Performed as group-led instruction    Resistance Training Performed  Yes    VAD Patient?  No    PAD/SET Patient?  No      Pain Assessment   Currently in Pain?  No/denies    Pain Score  0-No pain    Multiple Pain Sites  No       Capillary Blood Glucose: No results found for this or any previous visit (from the past 24 hour(s)).    Social History   Tobacco Use  Smoking Status Former Smoker  . Packs/day: 1.00  . Years: 28.00  . Pack years: 28.00  . Types: Cigarettes  . Start date: 10/21/1956  . Last attempt to quit: 05/10/1985  . Years since quitting: 32.9  Smokeless Tobacco Never Used    Goals Met:  Proper associated with RPD/PD & O2 Sat Independence with exercise equipment Exercise tolerated well No report of cardiac concerns or symptoms Strength training completed today  Goals Unmet:  Not Applicable  Comments: Pt able to follow exercise prescription today without complaint.  Will continue to monitor for progression. Check out 1145.   Dr. Sinda Du is Medical Director for [facility] Pulmonary Rehab.

## 2018-04-27 ENCOUNTER — Encounter (HOSPITAL_COMMUNITY)
Admission: RE | Admit: 2018-04-27 | Discharge: 2018-04-27 | Disposition: A | Discharge status: Home / Self Care | Payer: Medicare Other | Source: Ambulatory Visit | Attending: Pulmonary Disease | Admitting: Pulmonary Disease

## 2018-04-27 DIAGNOSIS — J432 Centrilobular emphysema: Secondary | ICD-10-CM

## 2018-04-27 NOTE — Progress Notes (Signed)
Pulmonary Individual Treatment Plan  Patient Details  Name: Christian Rasmussen MRN: [id number] Date of Birth: 04/26/1941 Referring Provider:     PULMONARY REHAB OTHER RESP ORIENTATION from 03/20/2018 in [place]  Referring Provider  McQuaid      Initial Encounter Date:    PULMONARY REHAB OTHER RESP ORIENTATION from 03/20/2018 in [place]  Date  03/20/18      Visit Diagnosis: Centrilobular emphysema ([place])  Patient's Home Medications on Admission:   Current Outpatient Medications:  .  amLODipine (NORVASC) 10 MG tablet, Take 1 tablet (10 mg total) by mouth daily., Disp: 90 tablet, Rfl: 3 .  aspirin EC 81 MG tablet, Take 1 tablet (81 mg total) by mouth daily., Disp: 90 tablet, Rfl: 3 .  ASTAXANTHIN PO, Take 10 mg by mouth daily. , Disp: , Rfl:  .  atorvastatin (LIPITOR) 40 MG tablet, Take 40 mg by mouth daily. , Disp: , Rfl:  .  carvedilol (COREG) 6.25 MG tablet, TAKE 2 TABLETS TWICE DAILY, Disp: 360 tablet, Rfl: 3 .  Coenzyme Q10 200 MG capsule, Take 200 mg by mouth daily. , Disp: , Rfl:  .  furosemide (LASIX) 40 MG tablet, Take 1 tablet (40 mg total) by mouth 2 (two) times daily., Disp: 180 tablet, Rfl: 3 .  lisinopril (PRINIVIL,ZESTRIL) 20 MG tablet, Take 1 tablet (20 mg total) by mouth daily., Disp: 90 tablet, Rfl: 3 .  metFORMIN (GLUCOPHAGE) 500 MG tablet, Take 1,000 mg by mouth 2 (two) times daily with a meal. , Disp: , Rfl: 2 .  potassium chloride (KLOR-CON 10) 10 MEQ tablet, Take 1 tablet (10 mEq total) by mouth 2 (two) times daily., Disp: 180 tablet, Rfl: 3 .  warfarin (COUMADIN) 5 MG tablet, Take 1 1/2 tablets daily except 1 tablet on Mondays and Fridays, Disp: 135 tablet, Rfl: 3  Past Medical History: Past Medical History:  Diagnosis Date  . 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 Aortic stenosis 10/14/2015  . CAD (coronary artery disease)    status post Taxus stent patency of RCA 2004, Cardiolite negative for  ischemia in 2010.  . Diabetes mellitus ([place])   . Dyslipidemia   . History of kidney stones   . Hypertension   . Iron deficiency anemia 02/02/2018  . Mitral regurgitation 10/15/2015   Moderate-severe by intra-operative TEE  . Overweight   . Right bundle branch block (RBBB) with left anterior hemiblock   . 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 Snores   . Typical atrial flutter [facility])    s/p ablation 11-23-2012 by Dr Rayann Heman    Tobacco Use: Social History   Tobacco Use  Smoking Status Former Smoker  . Packs/day: 1.00  . Years: 28.00  . Pack years: 28.00  . Types: Cigarettes  . Start date: 10/21/1956  . Last attempt to quit: 05/10/1985  . Years since quitting: 32.9  Smokeless Tobacco Never Used    Labs: Recent Chemical engineer    Labs for ITP Cardiac and Pulmonary Rehab Latest Ref Rng & Units 10/16/2015 10/16/2015 10/16/2015 10/16/2015 10/18/2015   Cholestrol 0 - 200 mg/dL - - - - -   LDLCALC  0 - 99 mg/dL - - - - -   HDL >40 mg/dL - - - - -   Trlycerides <150 mg/dL - - - - -   Hemoglobin A1c 4.8 - 5.6 % - - - - -   PHART 7.350 - 7.450 - 7.394 7.415 - -   PCO2ART 35.0 - 45.0 mmHg - 35.5 36.2 - -   HCO3 20.0 - 24.0 mEq/L - 21.6 23.2 - -   TCO2 0 - 100 mmol/L 22 23 24 20 26    ACIDBASEDEF 0.0 - 2.0 mmol/L - 3.0(H) 1.0 - -   O2SAT % - 96.0 97.0 - -      Capillary Blood Glucose: Lab Results  Component Value Date   GLUCAP 122 (H) 09/16/2017   GLUCAP 168 (H) 03/20/2016   GLUCAP 94 03/19/2016   GLUCAP 114 (H) 03/19/2016   GLUCAP 96 03/19/2016     Pulmonary Assessment Scores: Pulmonary Assessment Scores    Row Name 03/20/18 1518         ADL UCSD   ADL Phase   Entry     SOB Score total  45     Rest  0     Walk  9     Stairs  2     Bath  1     Dress  1     Shop  0       CAT Score   CAT Score  16       mMRC Score   mMRC Score  3        Pulmonary Function Assessment: Pulmonary Function Assessment - 03/20/18 1515      Pulmonary Function Tests   FVC%  54 %    FEV1%  54 %    FEV1/FVC Ratio  73    RV%  4.37 %    DLCO%  13.97 %      Initial Spirometry Results   FVC%  57 %    FEV1%  57 %    FEV1/FVC Ratio  73      Post Bronchodilator Spirometry Results   FVC%  57 %    FEV1%  57 %    FEV1/FVC Ratio  73      Breath   Bilateral Breath Sounds  Clear    Shortness of Breath  Yes;Limiting activity       Exercise Target Goals: Exercise Program Goal: Individual exercise prescription set using results from initial 6 min walk test and THRR while considering  patient's activity barriers and safety.   Exercise Prescription Goal: Initial exercise prescription builds to 30-45 minutes a day of aerobic activity, 2-3 days per week.  Home exercise guidelines will be given to patient during program as part of exercise prescription that the participant will acknowledge.  Activity Barriers & Risk Stratification: Activity Barriers & Cardiac Risk Stratification - 03/20/18 1514      Activity Barriers & Cardiac Risk Stratification   Cardiac Risk Stratification  High       6 Minute Walk: 6 Minute Walk    Row Name 03/20/18 1353         6 Minute Walk   Phase  Initial     Distance  800 feet     Walk Time  5.41 minutes     # of Rest Breaks  1     MPH  1.51     METS  2.16     RPE  13     Perceived Dyspnea  13     VO2 Peak  6.65     Symptoms  No     Resting HR  83 bpm     Resting BP  108/60     Resting Oxygen Saturation   94 %     Exercise Oxygen Saturation  during 6 min walk  89 %     Max Ex. HR  115 bpm     Max Ex. BP  144/72     2 Minute Post BP  122/70        Oxygen Initial Assessment: Oxygen Initial Assessment - 03/20/18  1514      Home Oxygen   Home Oxygen Device  None       Oxygen Re-Evaluation: Oxygen Re-Evaluation    Row Name 04/27/18 1525             Program Oxygen Prescription   Program Oxygen Prescription  None         Home Oxygen   Home Oxygen Device  Liquid Oxygen       Sleep Oxygen Prescription  None       Home Exercise Oxygen Prescription  None       Home at Rest Exercise Oxygen Prescription  None         Goals/Expected Outcomes   Short Term Goals  To learn and understand importance of monitoring SPO2 with pulse oximeter and demonstrate accurate use of the pulse oximeter.;To learn and understand importance of maintaining oxygen saturations>88%;To learn and demonstrate proper pursed lip breathing techniques or other breathing techniques.       Long  Term Goals  Verbalizes importance of monitoring SPO2 with pulse oximeter and return demonstration;Maintenance of O2 saturations>88%;Exhibits proper breathing techniques, such as pursed lip breathing or other method taught during program session       Comments  Patient is able to verbalize importance of monitoring his SPO2 with pulse oximeter and demonstrates proper pursed lip breathing technique during sessions.        Goals/Expected Outcomes  Patient will continue to meet his short and long term goals.           Oxygen Discharge (Final Oxygen Re-Evaluation): Oxygen Re-Evaluation - 04/27/18 1525      Program Oxygen Prescription   Program Oxygen Prescription  None      Home Oxygen   Home Oxygen Device  Liquid Oxygen    Sleep Oxygen Prescription  None    Home Exercise Oxygen Prescription  None    Home at Rest Exercise Oxygen Prescription  None      Goals/Expected Outcomes   Short Term Goals  To learn and understand importance of monitoring SPO2 with pulse oximeter and demonstrate accurate use of the pulse oximeter.;To learn and understand importance of maintaining oxygen saturations>88%;To learn and demonstrate proper pursed lip  breathing techniques or other breathing techniques.    Long  Term Goals  Verbalizes importance of monitoring SPO2 with pulse oximeter and return demonstration;Maintenance of O2 saturations>88%;Exhibits proper breathing techniques, such as pursed lip breathing or other method taught during program session    Comments  Patient is able to verbalize importance of monitoring his SPO2 with pulse oximeter and demonstrates proper pursed lip breathing technique during sessions.     Goals/Expected Outcomes  Patient will continue to meet his short and long term goals.        Initial Exercise Prescription: Initial Exercise Prescription - 03/20/18 1400      Date of Initial Exercise RX and Referring  Provider   Date  03/20/18    Referring Provider  McQuaid    Expected Discharge Date  06/20/18      NuStep   Level  1    SPM  75    Minutes  17    METs  2.2      Recumbant Elliptical   Level  1    RPM  44    Watts  50    Minutes  17    METs  2.9      Prescription Details   Frequency (times per week)  2    Duration  Progress to 30 minutes of continuous aerobic without signs/symptoms of physical distress      Intensity   THRR 40-80% of Max Heartrate  107-120-132    Ratings of Perceived Exertion  11-13    Perceived Dyspnea  0-4      Progression   Progression  Continue progressive overload as per policy without signs/symptoms or physical distress.      Resistance Training   Training Prescription  Yes    Weight  1    Reps  10-15       Perform Capillary Blood Glucose checks as needed.  Exercise Prescription Changes:  Exercise Prescription Changes    Row Name 04/13/18 0700 04/27/18 0800           Response to Exercise   Blood Pressure (Admit)  112/60  112/64      Blood Pressure (Exercise)  136/62  122/64      Blood Pressure (Exit)  110/58  108/60      Heart Rate (Admit)  66 bpm  85 bpm      Heart Rate (Exercise)  97 bpm  94 bpm      Heart Rate (Exit)  88 bpm  82 bpm      Oxygen  Saturation (Admit)  92 %  91 %      Oxygen Saturation (Exercise)  90 %  90 %      Oxygen Saturation (Exit)  92 %  90 %      Rating of Perceived Exertion (Exercise)  11  13      Perceived Dyspnea (Exercise)  11  13      Comments  First two weeks of exercise sessions  -      Duration  Progress to 30 minutes of  aerobic without signs/symptoms of physical distress  Progress to 30 minutes of  aerobic without signs/symptoms of physical distress      Intensity  THRR New 107-120-132  THRR unchanged        Progression   Progression  Continue to progress workloads to maintain intensity without signs/symptoms of physical distress.  Continue to progress workloads to maintain intensity without signs/symptoms of physical distress.      Average METs  2.05  2.65        Resistance Training   Training Prescription  Yes  Yes      Weight  2  3      Reps  10-15  10-15        NuStep   Level  1  2      SPM  70  75      Minutes  22  22      METs  1.9  2.2        Recumbant Elliptical   Level  1  1      RPM  37  41  Watts  32  28      Minutes  17  17      METs  2.2  3.1         Exercise Comments:  Exercise Comments    Row Name 04/04/18 1023 04/27/18 0816         Exercise Comments  Patient recently began program. He has finished one complete exercise session. We will monitor and progress as tolerated.   Pt. has been doing well in the program, he has attended 7 sessions. He has tolerated the exercise well. We will continue to monitor progress.          Exercise Goals and Review:  Exercise Goals    Row Name 03/20/18 1355             Exercise Goals   Increase Physical Activity  Yes       Intervention  Provide advice, education, support and counseling about physical activity/exercise needs.;Develop an individualized exercise prescription for aerobic and resistive training based on initial evaluation findings, risk stratification, comorbidities and participant's personal goals.        Expected Outcomes  Short Term: Attend rehab on a regular basis to increase amount of physical activity.       Increase Strength and Stamina  Yes       Intervention  Provide advice, education, support and counseling about physical activity/exercise needs.;Develop an individualized exercise prescription for aerobic and resistive training based on initial evaluation findings, risk stratification, comorbidities and participant's personal goals.       Expected Outcomes  Short Term: Increase workloads from initial exercise prescription for resistance, speed, and METs.       Able to understand and use rate of perceived exertion (RPE) scale  Yes       Intervention  Provide education and explanation on how to use RPE scale       Expected Outcomes  Short Term: Able to use RPE daily in rehab to express subjective intensity level;Long Term:  Able to use RPE to guide intensity level when exercising independently       Able to understand and use Dyspnea scale  Yes       Intervention  Provide education and explanation on how to use Dyspnea scale       Expected Outcomes  Short Term: Able to use Dyspnea scale daily in rehab to express subjective sense of shortness of breath during exertion;Long Term: Able to use Dyspnea scale to guide intensity level when exercising independently       Knowledge and understanding of Target Heart Rate Range (THRR)  Yes       Intervention  Provide education and explanation of THRR including how the numbers were predicted and where they are located for reference       Expected Outcomes  Short Term: Able to state/look up THRR;Long Term: Able to use THRR to govern intensity when exercising independently;Short Term: Able to use daily as guideline for intensity in rehab       Able to check pulse independently  Yes       Intervention  Provide education and demonstration on how to check pulse in carotid and radial arteries.;Review the importance of being able to check your own pulse for safety  during independent exercise       Expected Outcomes  Short Term: Able to explain why pulse checking is important during independent exercise;Long Term: Able to check pulse independently and accurately  Understanding of Exercise Prescription  Yes       Intervention  Provide education, explanation, and written materials on patient's individual exercise prescription       Expected Outcomes  Short Term: Able to explain program exercise prescription;Long Term: Able to explain home exercise prescription to exercise independently          Exercise Goals Re-Evaluation : Exercise Goals Re-Evaluation    Row Name 04/04/18 1022 04/27/18 0814           Exercise Goal Re-Evaluation   Exercise Goals Review  Increase Strength and Stamina;Increase Physical Activity;Able to understand and use rate of perceived exertion (RPE) scale;Knowledge and understanding of Target Heart Rate Range (THRR);Understanding of Exercise Prescription;Able to check pulse independently;Able to understand and use Dyspnea scale  Increase Strength and Stamina;Increase Physical Activity;Able to understand and use rate of perceived exertion (RPE) scale;Knowledge and understanding of Target Heart Rate Range (THRR);Understanding of Exercise Prescription;Able to check pulse independently;Able to understand and use Dyspnea scale      Comments  Patient has just recently began the program, he has had one full exercise visit. We will continue to monitor and progress as tolerated.   Pt. has been doing well in the program so far. He has tolerated the exercise well and has stated his breathing is already improving with ADLs.       Expected Outcomes  Increase activity level.   Increase activity level.          Discharge Exercise Prescription (Final Exercise Prescription Changes): Exercise Prescription Changes - 04/27/18 0800      Response to Exercise   Blood Pressure (Admit)  112/64    Blood Pressure (Exercise)  122/64    Blood Pressure  (Exit)  108/60    Heart Rate (Admit)  85 bpm    Heart Rate (Exercise)  94 bpm    Heart Rate (Exit)  82 bpm    Oxygen Saturation (Admit)  91 %    Oxygen Saturation (Exercise)  90 %    Oxygen Saturation (Exit)  90 %    Rating of Perceived Exertion (Exercise)  13    Perceived Dyspnea (Exercise)  13    Duration  Progress to 30 minutes of  aerobic without signs/symptoms of physical distress    Intensity  THRR unchanged      Progression   Progression  Continue to progress workloads to maintain intensity without signs/symptoms of physical distress.    Average METs  2.65      Resistance Training   Training Prescription  Yes    Weight  3    Reps  10-15      NuStep   Level  2    SPM  75    Minutes  22    METs  2.2      Recumbant Elliptical   Level  1    RPM  41    Watts  54    Minutes  17    METs  3.1       Nutrition:  Target Goals: Understanding of nutrition guidelines, daily intake of sodium 1500mg , cholesterol 200mg , calories 30% from fat and 7% or less from saturated fats, daily to have 5 or more servings of fruits and vegetables.  Biometrics: Pre Biometrics - 03/20/18 1355      Pre Biometrics   Height  5\' 11"  (1.803 m)    Waist Circumference  44 inches    Hip Circumference  43.5 inches    Waist  to Hip Ratio  1.01 %    Triceps Skinfold  10 mm    % Body Fat  28.6 %    Grip Strength  18.7 kg    Single Leg Stand  1.5 seconds        Nutrition Therapy Plan and Nutrition Goals: Nutrition Therapy & Goals - 04/27/18 1527      Nutrition Therapy   RD appointment deferred  Yes      Personal Nutrition Goals   Comments  Patient plans to attend the next RD appointment in Janurary.       Intervention Plan   Intervention  Nutrition handout(s) given to patient.       Nutrition Assessments: Nutrition Assessments - 03/20/18 1459      MEDFICTS Scores   Pre Score  67       Nutrition Goals Re-Evaluation:   Nutrition Goals Discharge (Final Nutrition Goals  Re-Evaluation):   Psychosocial: Target Goals: Acknowledge presence or absence of significant depression and/or stress, maximize coping skills, provide positive support system. Participant is able to verbalize types and ability to use techniques and skills needed for reducing stress and depression.  Initial Review & Psychosocial Screening: Initial Psych Review & Screening - 03/20/18 1456      Initial Review   Current issues with  None Identified      Family Dynamics   Good Support System?  Yes    Concerns  Recent loss of child   Patient seems to be coping with this tragedy.     Barriers   Psychosocial barriers to participate in program  There are no identifiable barriers or psychosocial needs.      Screening Interventions   Interventions  Encouraged to exercise    Expected Outcomes  Short Term goal: Identification and review with participant of any Quality of Life or Depression concerns found by scoring the questionnaire.;Long Term goal: The participant improves quality of Life and PHQ9 Scores as seen by post scores and/or verbalization of changes       Quality of Life Scores: Quality of Life - 03/20/18 1356      Quality of Life   Select  Quality of Life      Quality of Life Scores   Health/Function Pre  23.96 %    Socioeconomic Pre  25.5 %    Psych/Spiritual Pre  25.6 %    Family Pre  30 %    GLOBAL Pre  25.23 %      Scores of 19 and below usually indicate a poorer quality of life in these areas.  A difference of  2-3 points is a clinically meaningful difference.  A difference of 2-3 points in the total score of the Quality of Life Index has been associated with significant improvement in overall quality of life, self-image, physical symptoms, and general health in studies assessing change in quality of life.   PHQ-9: Recent Review Flowsheet Data    Depression screen [facility] 2/9 03/20/2018 11/26/2015   Decreased Interest 0 0   Down, Depressed, Hopeless 0 0   PHQ - 2 Score 0  0   Altered sleeping 0 0   Tired, decreased energy 0 1   Change in appetite 0 1   Feeling bad or failure about yourself  1 -   Trouble concentrating 0 0   Moving slowly or fidgety/restless 0 0   Suicidal thoughts 0 0   PHQ-9 Score 1 2   Difficult doing work/chores Not difficult at all Not  difficult at all     Interpretation of Total Score  Total Score Depression Severity:  1-4 = Minimal depression, 5-9 = Mild depression, 10-14 = Moderate depression, 15-19 = Moderately severe depression, 20-27 = Severe depression   Psychosocial Evaluation and Intervention: Psychosocial Evaluation - 03/20/18 1458      Psychosocial Evaluation & Interventions   Interventions  Encouraged to exercise with the program and follow exercise prescription    Continue Psychosocial Services   No Follow up required       Psychosocial Re-Evaluation: Psychosocial Re-Evaluation    [place] Name 04/27/18 1530             Psychosocial Re-Evaluation   Current issues with  None Identified       Comments  Patient's QOL score was 25.43 and PHQ-9 score was 2. He has no psychosocial issues identified.        Expected Outcomes  Patient will have no psychosocial issues identified at discharge.        Interventions  Stress management education;Relaxation education;Encouraged to attend Pulmonary Rehabilitation for the exercise       Continue Psychosocial Services   No Follow up required          Psychosocial Discharge (Final Psychosocial Re-Evaluation): Psychosocial Re-Evaluation - 04/27/18 1530      Psychosocial Re-Evaluation   Current issues with  None Identified    Comments  Patient's QOL score was 25.43 and PHQ-9 score was 2. He has no psychosocial issues identified.     Expected Outcomes  Patient will have no psychosocial issues identified at discharge.     Interventions  Stress management education;Relaxation education;Encouraged to attend Pulmonary Rehabilitation for the exercise    Continue Psychosocial  Services   No Follow up required        Education: Education Goals: Education classes will be provided on a weekly basis, covering required topics. Participant will state understanding/return demonstration of topics presented.  Learning Barriers/Preferences: Learning Barriers/Preferences - 03/20/18 1447      Learning Barriers/Preferences   Learning Barriers  None    Learning Preferences  Individual Instruction;Group Instruction;Skilled Demonstration       Education Topics: How Lungs Work and Diseases: - Discuss the anatomy of the lungs and diseases that can affect the lungs, such as COPD.   Exercise: -Discuss the importance of exercise, FITT principles of exercise, normal and abnormal responses to exercise, and how to exercise safely.   Environmental Irritants: -Discuss types of environmental irritants and how to limit exposure to environmental irritants.   Meds/Inhalers and oxygen: - Discuss respiratory medications, definition of an inhaler and oxygen, and the proper way to use an inhaler and oxygen.   Energy Saving Techniques: - Discuss methods to conserve energy and decrease shortness of breath when performing activities of daily living.    Bronchial Hygiene / Breathing Techniques: - Discuss breathing mechanics, pursed-lip breathing technique,  proper posture, effective ways to clear airways, and other functional breathing techniques   PULMONARY REHAB OTHER RESPIRATORY from 04/27/2018 in [place]  Date  04/13/18  Educator  Wynetta Emery  Instruction Review Code  2- Demonstrated Understanding      Cleaning Equipment: - Provides group verbal and written instruction about the health risks of elevated stress, cause of high stress, and healthy ways to reduce stress.   Nutrition I: Fats: - Discuss the types of cholesterol, what cholesterol does to the body, and how cholesterol levels can be controlled.   PULMONARY REHAB OTHER RESPIRATORY from  04/27/2018 in [place]  Date  04/27/18  Educator  Wynetta Emery  Instruction Review Code  2- Demonstrated Understanding      Nutrition II: Labels: -Discuss the different components of food labels and how to read food labels.   Respiratory Infections: - Discuss the signs and symptoms of respiratory infections, ways to prevent respiratory infections, and the importance of seeking medical treatment when having a respiratory infection.   Stress I: Signs and Symptoms: - Discuss the causes of stress, how stress may lead to anxiety and depression, and ways to limit stress.   Stress II: Relaxation: -Discuss relaxation techniques to limit stress.   Oxygen for Home/Travel: - Discuss how to prepare for travel when on oxygen and proper ways to transport and store oxygen to ensure safety.   Knowledge Questionnaire Score: Knowledge Questionnaire Score - 03/20/18 1448      Knowledge Questionnaire Score   Pre Score  15/17       Core Components/Risk Factors/Patient Goals at Admission: Personal Goals and Risk Factors at Admission - 03/20/18 1459      Core Components/Risk Factors/Patient Goals on Admission    Weight Management  Weight Maintenance    Personal Goal Other  Yes    Personal Goal  Increase his activity    Intervention  Come to [facility] 3 days/week and supplement with 2 more days at home.     Expected Outcomes  To reach personal goals.        Core Components/Risk Factors/Patient Goals Review:  Goals and Risk Factor Review    Row Name 04/27/18 1528             Core Components/Risk Factors/Patient Goals Review   Personal Goals Review  Weight Management/Obesity;Improve shortness of breath with ADL's Gain strength; increase activities.        Review  Patient has completed 8 sessions gaining 8 lbs since he started the program. He admitts to needing to work on his diet and blames it on the holiday season. He is doing well in the program with progression. He says  he is feeling somewhat stronger and hopes to continue to improve as the continues to attend. Will continue to monitor for progress.        Expected Outcomes  Patient will continue to attend sessions and complete the program meeting his personal goals.           Core Components/Risk Factors/Patient Goals at Discharge (Final Review):  Goals and Risk Factor Review - 04/27/18 1528      Core Components/Risk Factors/Patient Goals Review   Personal Goals Review  Weight Management/Obesity;Improve shortness of breath with ADL's   Gain strength; increase activities.    Review  Patient has completed 8 sessions gaining 8 lbs since he started the program. He admitts to needing to work on his diet and blames it on the holiday season. He is doing well in the program with progression. He says he is feeling somewhat stronger and hopes to continue to improve as the continues to attend. Will continue to monitor for progress.     Expected Outcomes  Patient will continue to attend sessions and complete the program meeting his personal goals.        ITP Comments: ITP Comments    Row Name 03/20/18 1512 04/10/18 1415         ITP Comments  Patient has been in our program before with his heart. He is returning with emphysema. He is now retired and plans to  complete the program this time.   Patient is new to program. He has completed 3 sessions. Will continue to monitor for progress.          Comments: ITP REVIEW Pt is making expected progress toward pulmonary rehab goals after completing 8 sessions. Recommend continued exercise, life style modification, education, and utilization of breathing techniques to increase stamina and strength and decrease shortness of breath with exertion.

## 2018-04-27 NOTE — Progress Notes (Signed)
Daily Session Note  Patient Details  Name: Christian Rasmussen MRN: [id number] Date of Birth: [birth year] Referring Provider:     PULMONARY REHAB OTHER RESP ORIENTATION from 03/20/2018 in [place]  Referring Provider  McQuaid      Encounter Date: 04/27/2018  Check In: Session Check In - 04/27/18 1045      Check-In   Supervising physician immediately available to respond to emergencies  See telemetry face sheet for immediately available MD    Location  [facility]    Staff Present  Russella Dar, MS, EP, [facility], Exercise Physiologist;Amanda Ballard, Exercise Physiologist;Bravery Ketcham Wynetta Emery, RN, BSN    Medication changes reported      No    Fall or balance concerns reported     No    Warm-up and Cool-down  Performed as group-led instruction    Resistance Training Performed  Yes    VAD Patient?  No    PAD/SET Patient?  No      Pain Assessment   Currently in Pain?  No/denies    Pain Score  0-No pain    Multiple Pain Sites  No       Capillary Blood Glucose: No results found for this or any previous visit (from the past 24 hour(s)).    Social History   Tobacco Use  Smoking Status Former Smoker  . Packs/day: 1.00  . Years: 28.00  . Pack years: 28.00  . Types: Cigarettes  . Start date: 10/21/1956  . Last attempt to quit: 05/10/1985  . Years since quitting: 32.9  Smokeless Tobacco Never Used    Goals Met:  Proper associated with RPD/PD & O2 Sat Independence with exercise equipment Improved SOB with ADL's Using PLB without cueing & demonstrates good technique Exercise tolerated well No report of cardiac concerns or symptoms Strength training completed today  Goals Unmet:  Not Applicable  Comments: Pt able to follow exercise prescription today without complaint.  Will continue to monitor for progression. Check out 1145.   Dr. Sinda Du is Medical Director for [facility].

## 2018-05-02 ENCOUNTER — Ambulatory Visit (INDEPENDENT_AMBULATORY_CARE_PROVIDER_SITE_OTHER): Payer: Medicare Other | Admitting: *Deleted

## 2018-05-02 ENCOUNTER — Encounter (HOSPITAL_COMMUNITY): Payer: Medicare Other

## 2018-05-02 DIAGNOSIS — I4891 Unspecified atrial fibrillation: Secondary | ICD-10-CM | POA: Diagnosis not present

## 2018-05-02 DIAGNOSIS — Z5181 Encounter for therapeutic drug level monitoring: Secondary | ICD-10-CM

## 2018-05-02 LAB — POCT INR: INR: 3.2 — AB (ref 2.0–3.0)

## 2018-05-02 NOTE — Patient Instructions (Signed)
Take coumadin 1/2 tablet tonight then resume 1 tablet daily except 1 1/2 tablets on Tuesdays, Thursdays and Saturdays Recheck in 3 weeks

## 2018-05-04 ENCOUNTER — Encounter (HOSPITAL_COMMUNITY)
Admission: RE | Admit: 2018-05-04 | Discharge: 2018-05-04 | Disposition: A | Discharge status: Home / Self Care | Payer: Medicare Other | Source: Ambulatory Visit | Attending: Pulmonary Disease | Admitting: Pulmonary Disease

## 2018-05-04 DIAGNOSIS — J432 Centrilobular emphysema: Secondary | ICD-10-CM

## 2018-05-04 NOTE — Progress Notes (Signed)
Daily Session Note  Patient Details  Name: Christian Rasmussen MRN: [id number] Date of Birth: [birth year] Referring Provider:     PULMONARY REHAB OTHER RESP ORIENTATION from 03/20/2018 in [place]  Referring Provider  McQuaid      Encounter Date: 05/04/2018  Check In: Session Check In - 05/04/18 1045      Check-In   Supervising physician immediately available to respond to emergencies  See telemetry face sheet for immediately available MD    Location  [facility]    Staff Present  Russella Dar, MS, EP, [facility], Exercise Physiologist;Paysley Poplar Zachery Conch, Exercise Physiologist    Medication changes reported      No    Fall or balance concerns reported     No    Warm-up and Cool-down  Performed as group-led instruction    Resistance Training Performed  Yes    VAD Patient?  No    PAD/SET Patient?  No      Pain Assessment   Currently in Pain?  No/denies    Pain Score  0-No pain    Multiple Pain Sites  No       Capillary Blood Glucose: No results found for this or any previous visit (from the past 24 hour(s)).    Social History   Tobacco Use  Smoking Status Former Smoker  . Packs/day: 1.00  . Years: 28.00  . Pack years: 28.00  . Types: Cigarettes  . Start date: 10/21/1956  . Last attempt to quit: 05/10/1985  . Years since quitting: 33.0  Smokeless Tobacco Never Used    Goals Met:  Proper associated with RPD/PD & O2 Sat Independence with exercise equipment Using PLB without cueing & demonstrates good technique Exercise tolerated well No report of cardiac concerns or symptoms Strength training completed today  Goals Unmet:  Not Applicable  Comments: Pt able to follow exercise prescription today without complaint.  Will continue to monitor for progression. Check out 1145.   Dr. Sinda Du is Medical Director for [facility].

## 2018-05-08 DIAGNOSIS — Z1211 Encounter for screening for malignant neoplasm of colon: Secondary | ICD-10-CM | POA: Diagnosis not present

## 2018-05-08 DIAGNOSIS — E7800 Pure hypercholesterolemia, unspecified: Secondary | ICD-10-CM | POA: Diagnosis not present

## 2018-05-08 DIAGNOSIS — Z6830 Body mass index (BMI) 30.0-30.9, adult: Secondary | ICD-10-CM | POA: Diagnosis not present

## 2018-05-08 DIAGNOSIS — E1165 Type 2 diabetes mellitus with hyperglycemia: Secondary | ICD-10-CM | POA: Diagnosis not present

## 2018-05-08 DIAGNOSIS — Z7189 Other specified counseling: Secondary | ICD-10-CM | POA: Diagnosis not present

## 2018-05-08 DIAGNOSIS — I10 Essential (primary) hypertension: Secondary | ICD-10-CM | POA: Diagnosis not present

## 2018-05-08 DIAGNOSIS — Z1339 Encounter for screening examination for other mental health and behavioral disorders: Secondary | ICD-10-CM | POA: Diagnosis not present

## 2018-05-08 DIAGNOSIS — Z0000 Encounter for general adult medical examination without abnormal findings: Secondary | ICD-10-CM | POA: Diagnosis not present

## 2018-05-08 DIAGNOSIS — I509 Heart failure, unspecified: Secondary | ICD-10-CM | POA: Diagnosis not present

## 2018-05-08 DIAGNOSIS — Z299 Encounter for prophylactic measures, unspecified: Secondary | ICD-10-CM | POA: Diagnosis not present

## 2018-05-08 DIAGNOSIS — Z1331 Encounter for screening for depression: Secondary | ICD-10-CM | POA: Diagnosis not present

## 2018-05-09 ENCOUNTER — Encounter (HOSPITAL_COMMUNITY): Payer: Medicare Other

## 2018-05-09 DIAGNOSIS — H6123 Impacted cerumen, bilateral: Secondary | ICD-10-CM | POA: Diagnosis not present

## 2018-05-09 DIAGNOSIS — Z299 Encounter for prophylactic measures, unspecified: Secondary | ICD-10-CM | POA: Diagnosis not present

## 2018-05-09 DIAGNOSIS — Z125 Encounter for screening for malignant neoplasm of prostate: Secondary | ICD-10-CM | POA: Diagnosis not present

## 2018-05-09 DIAGNOSIS — I10 Essential (primary) hypertension: Secondary | ICD-10-CM | POA: Diagnosis not present

## 2018-05-09 DIAGNOSIS — E7800 Pure hypercholesterolemia, unspecified: Secondary | ICD-10-CM | POA: Diagnosis not present

## 2018-05-09 DIAGNOSIS — Z6830 Body mass index (BMI) 30.0-30.9, adult: Secondary | ICD-10-CM | POA: Diagnosis not present

## 2018-05-09 DIAGNOSIS — Z79899 Other long term (current) drug therapy: Secondary | ICD-10-CM | POA: Diagnosis not present

## 2018-05-11 ENCOUNTER — Encounter (HOSPITAL_COMMUNITY)
Admission: RE | Admit: 2018-05-11 | Discharge: 2018-05-11 | Disposition: A | Discharge status: Home / Self Care | Payer: Medicare Other | Source: Ambulatory Visit | Attending: Pulmonary Disease | Admitting: Pulmonary Disease

## 2018-05-11 DIAGNOSIS — J432 Centrilobular emphysema: Secondary | ICD-10-CM | POA: Diagnosis not present

## 2018-05-11 NOTE — Progress Notes (Signed)
Daily Session Note  Patient Details  Name: Christian Rasmussen MRN: [id number] Date of Birth: 1940-10-06 Referring Provider:     PULMONARY REHAB OTHER RESP ORIENTATION from 03/20/2018 in [place]  Referring Provider  McQuaid      Encounter Date: 05/11/2018  Check In: Session Check In - 05/11/18 1045      Check-In   Supervising physician immediately available to respond to emergencies  See telemetry face sheet for immediately available MD    Location  [facility]    Staff Present  Russella Dar, MS, EP, [facility], Exercise Physiologist;Amanda Ballard, Exercise Physiologist;Darrie Macmillan Wynetta Emery, RN, BSN    Medication changes reported      No    Fall or balance concerns reported     No    Warm-up and Cool-down  Performed as group-led instruction    Resistance Training Performed  Yes    VAD Patient?  No    PAD/SET Patient?  No      Pain Assessment   Currently in Pain?  No/denies    Pain Score  0-No pain    Multiple Pain Sites  No       Capillary Blood Glucose: No results found for this or any previous visit (from the past 24 hour(s)).    Social History   Tobacco Use  Smoking Status Former Smoker  . Packs/day: 1.00  . Years: 28.00  . Pack years: 28.00  . Types: Cigarettes  . Start date: 10/21/1956  . Last attempt to quit: 05/10/1985  . Years since quitting: 33.0  Smokeless Tobacco Never Used    Goals Met:  Proper associated with RPD/PD & O2 Sat Independence with exercise equipment Improved SOB with ADL's Using PLB without cueing & demonstrates good technique Exercise tolerated well No report of cardiac concerns or symptoms Strength training completed today  Goals Unmet:  Not Applicable  Comments: Pt able to follow exercise prescription today without complaint.  Will continue to monitor for progression. Check out 1145.   Dr. Sinda Du is Medical Director for [facility].

## 2018-05-16 ENCOUNTER — Encounter (HOSPITAL_COMMUNITY): Payer: Medicare Other

## 2018-05-18 ENCOUNTER — Encounter (HOSPITAL_COMMUNITY): Payer: Medicare Other

## 2018-05-18 NOTE — Progress Notes (Signed)
Pulmonary Individual Treatment Plan  Patient Details  Name: Christian Rasmussen MRN: [id number] Date of Birth: 1940/05/27 Referring Provider:     PULMONARY REHAB OTHER RESP ORIENTATION from 03/20/2018 in [place]  Referring Provider  McQuaid      Initial Encounter Date:    PULMONARY REHAB OTHER RESP ORIENTATION from 03/20/2018 in [place]  Date  03/20/18      Visit Diagnosis: Centrilobular emphysema ([place])  Patient's Home Medications on Admission:   Current Outpatient Medications:  .  amLODipine (NORVASC) 10 MG tablet, Take 1 tablet (10 mg total) by mouth daily., Disp: 90 tablet, Rfl: 3 .  aspirin EC 81 MG tablet, Take 1 tablet (81 mg total) by mouth daily., Disp: 90 tablet, Rfl: 3 .  ASTAXANTHIN PO, Take 10 mg by mouth daily. , Disp: , Rfl:  .  atorvastatin (LIPITOR) 40 MG tablet, Take 40 mg by mouth daily. , Disp: , Rfl:  .  carvedilol (COREG) 6.25 MG tablet, TAKE 2 TABLETS TWICE DAILY, Disp: 360 tablet, Rfl: 3 .  Coenzyme Q10 200 MG capsule, Take 200 mg by mouth daily. , Disp: , Rfl:  .  furosemide (LASIX) 40 MG tablet, Take 1 tablet (40 mg total) by mouth 2 (two) times daily., Disp: 180 tablet, Rfl: 3 .  lisinopril (PRINIVIL,ZESTRIL) 20 MG tablet, Take 1 tablet (20 mg total) by mouth daily., Disp: 90 tablet, Rfl: 3 .  metFORMIN (GLUCOPHAGE) 500 MG tablet, Take 1,000 mg by mouth 2 (two) times daily with a meal. , Disp: , Rfl: 2 .  potassium chloride (KLOR-CON 10) 10 MEQ tablet, Take 1 tablet (10 mEq total) by mouth 2 (two) times daily., Disp: 180 tablet, Rfl: 3 .  warfarin (COUMADIN) 5 MG tablet, Take 1 1/2 tablets daily except 1 tablet on Mondays and Fridays, Disp: 135 tablet, Rfl: 3  Past Medical History: Past Medical History:  Diagnosis Date  . 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 Aortic stenosis 10/14/2015  . CAD (coronary artery disease)    status post Taxus stent patency of RCA 2004, Cardiolite negative for  ischemia in 2010.  . Diabetes mellitus ([place])   . Dyslipidemia   . History of kidney stones   . Hypertension   . Iron deficiency anemia 02/02/2018  . Mitral regurgitation 10/15/2015   Moderate-severe by intra-operative TEE  . Overweight   . Right bundle branch block (RBBB) with left anterior hemiblock   . 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 Snores   . Typical atrial flutter [facility])    s/p ablation 11-23-2012 by Dr Rayann Heman    Tobacco Use: Social History   Tobacco Use  Smoking Status Former Smoker  . Packs/day: 1.00  . Years: 28.00  . Pack years: 28.00  . Types: Cigarettes  . Start date: 10/21/1956  . Last attempt to quit: 05/10/1985  . Years since quitting: 33.0  Smokeless Tobacco Never Used    Labs: Recent Review Scientist, physiological    Labs for ITP Cardiac and Pulmonary Rehab Latest Ref Rng & Units 10/16/2015 10/16/2015 10/16/2015 10/16/2015 10/18/2015   Cholestrol 0 - 200 mg/dL - - - - -   LDLCALC  0 - 99 mg/dL - - - - -   HDL >40 mg/dL - - - - -   Trlycerides <150 mg/dL - - - - -   Hemoglobin A1c 4.8 - 5.6 % - - - - -   PHART 7.350 - 7.450 - 7.394 7.415 - -   PCO2ART 35.0 - 45.0 mmHg - 35.5 36.2 - -   HCO3 20.0 - 24.0 mEq/L - 21.6 23.2 - -   TCO2 0 - 100 mmol/L 22 23 24 20 26    ACIDBASEDEF 0.0 - 2.0 mmol/L - 3.0(H) 1.0 - -   O2SAT % - 96.0 97.0 - -      Capillary Blood Glucose: Lab Results  Component Value Date   GLUCAP 122 (H) 09/16/2017   GLUCAP 168 (H) 03/20/2016   GLUCAP 94 03/19/2016   GLUCAP 114 (H) 03/19/2016   GLUCAP 96 03/19/2016     Pulmonary Assessment Scores: Pulmonary Assessment Scores    Row Name 03/20/18 1518         ADL UCSD   ADL Phase   Entry     SOB Score total  45     Rest  0     Walk  9     Stairs  2     Bath  1     Dress  1     Shop  0       CAT Score   CAT Score  16       mMRC Score   mMRC Score  3        Pulmonary Function Assessment: Pulmonary Function Assessment - 03/20/18 1515      Pulmonary Function Tests   FVC%  54 %    FEV1%  54 %    FEV1/FVC Ratio  73    RV%  4.37 %    DLCO%  13.97 %      Initial Spirometry Results   FVC%  57 %    FEV1%  57 %    FEV1/FVC Ratio  73      Post Bronchodilator Spirometry Results   FVC%  57 %    FEV1%  57 %    FEV1/FVC Ratio  73      Breath   Bilateral Breath Sounds  Clear    Shortness of Breath  Yes;Limiting activity       Exercise Target Goals: Exercise Program Goal: Individual exercise prescription set using results from initial 6 min walk test and THRR while considering  patient's activity barriers and safety.   Exercise Prescription Goal: Initial exercise prescription builds to 30-45 minutes a day of aerobic activity, 2-3 days per week.  Home exercise guidelines will be given to patient during program as part of exercise prescription that the participant will acknowledge.  Activity Barriers & Risk Stratification: Activity Barriers & Cardiac Risk Stratification - 03/20/18 1514      Activity Barriers & Cardiac Risk Stratification   Cardiac Risk Stratification  High       6 Minute Walk: 6 Minute Walk    Row Name 03/20/18 1353         6 Minute Walk   Phase  Initial     Distance  800 feet     Walk Time  5.41 minutes     # of Rest Breaks  1     MPH  1.51     METS  2.16     RPE  13     Perceived Dyspnea  13     VO2 Peak  6.65     Symptoms  No     Resting HR  83 bpm     Resting BP  108/60     Resting Oxygen Saturation   94 %     Exercise Oxygen Saturation  during 6 min walk  89 %     Max Ex. HR  115 bpm     Max Ex. BP  144/72     2 Minute Post BP  122/70        Oxygen Initial Assessment: Oxygen Initial Assessment - 03/20/18  1514      Home Oxygen   Home Oxygen Device  None       Oxygen Re-Evaluation: Oxygen Re-Evaluation    Row Name 04/27/18 1525 05/18/18 1510           Program Oxygen Prescription   Program Oxygen Prescription  None  None        Home Oxygen   Home Oxygen Device  Liquid Oxygen  None      Sleep Oxygen Prescription  None  None      Home Exercise Oxygen Prescription  None  None      Home at Rest Exercise Oxygen Prescription  None  None        Goals/Expected Outcomes   Short Term Goals  To learn and understand importance of monitoring SPO2 with pulse oximeter and demonstrate accurate use of the pulse oximeter.;To learn and understand importance of maintaining oxygen saturations>88%;To learn and demonstrate proper pursed lip breathing techniques or other breathing techniques.  To learn and understand importance of monitoring SPO2 with pulse oximeter and demonstrate accurate use of the pulse oximeter.;To learn and understand importance of maintaining oxygen saturations>88%;To learn and demonstrate proper pursed lip breathing techniques or other breathing techniques.      Long  Term Goals  Verbalizes importance of monitoring SPO2 with pulse oximeter and return demonstration;Maintenance of O2 saturations>88%;Exhibits proper breathing techniques, such as pursed lip breathing or other method taught during program session  Verbalizes importance of monitoring SPO2 with pulse oximeter and return demonstration;Maintenance of O2 saturations>88%;Exhibits proper breathing techniques, such as pursed lip breathing or other method taught during program session      Comments  Patient is able to verbalize importance of monitoring his SPO2 with pulse oximeter and demonstrates proper pursed lip breathing technique during sessions.   Patient is able to verbalize importance of monitoring his SPO2 with pulse oximeter and demonstrates proper pursed lip breathing technique during sessions.       Goals/Expected Outcomes   Patient will continue to meet his short and long term goals.   Patient will continue to meet his short and long term goals.          Oxygen Discharge (Final Oxygen Re-Evaluation): Oxygen Re-Evaluation - 05/18/18 1510      Program Oxygen Prescription   Program Oxygen Prescription  None      Home Oxygen   Home Oxygen Device  None    Sleep Oxygen Prescription  None    Home Exercise Oxygen Prescription  None    Home at Rest Exercise Oxygen Prescription  None      Goals/Expected Outcomes   Short Term Goals  To learn and understand importance of monitoring SPO2 with pulse oximeter and demonstrate accurate use of the pulse oximeter.;To learn and understand importance of maintaining oxygen saturations>88%;To learn and demonstrate proper pursed lip breathing techniques or other breathing techniques.  Long  Term Goals  Verbalizes importance of monitoring SPO2 with pulse oximeter and return demonstration;Maintenance of O2 saturations>88%;Exhibits proper breathing techniques, such as pursed lip breathing or other method taught during program session    Comments  Patient is able to verbalize importance of monitoring his SPO2 with pulse oximeter and demonstrates proper pursed lip breathing technique during sessions.     Goals/Expected Outcomes  Patient will continue to meet his short and long term goals.        Initial Exercise Prescription: Initial Exercise Prescription - 03/20/18 1400      Date of Initial Exercise RX and Referring Provider   Date  03/20/18    Referring Provider  McQuaid    Expected Discharge Date  06/20/18      NuStep   Level  1    SPM  75    Minutes  17    METs  2.2      Recumbant Elliptical   Level  1    RPM  44    Watts  50    Minutes  17    METs  2.9      Prescription Details   Frequency (times per week)  2    Duration  Progress to 30 minutes of continuous aerobic without signs/symptoms of physical distress      Intensity   THRR 40-80% of Max Heartrate   107-120-132    Ratings of Perceived Exertion  11-13    Perceived Dyspnea  0-4      Progression   Progression  Continue progressive overload as per policy without signs/symptoms or physical distress.      Resistance Training   Training Prescription  Yes    Weight  1    Reps  10-15       Perform Capillary Blood Glucose checks as needed.  Exercise Prescription Changes:  Exercise Prescription Changes    Row Name 04/13/18 0700 04/27/18 0800 05/11/18 1000         Response to Exercise   Blood Pressure (Admit)  112/60  112/64  118/64     Blood Pressure (Exercise)  136/62  122/64  128/66     Blood Pressure (Exit)  110/58  108/60  110/64     Heart Rate (Admit)  66 bpm  85 bpm  87 bpm     Heart Rate (Exercise)  97 bpm  94 bpm  110 bpm     Heart Rate (Exit)  88 bpm  82 bpm  97 bpm     Oxygen Saturation (Admit)  92 %  91 %  92 %     Oxygen Saturation (Exercise)  90 %  90 %  91 %     Oxygen Saturation (Exit)  92 %  90 %  91 %     Rating of Perceived Exertion (Exercise)  11  13  12      Perceived Dyspnea (Exercise)  11  13  12      Comments  First two weeks of exercise sessions  -  -     Duration  Progress to 30 minutes of  aerobic without signs/symptoms of physical distress  Progress to 30 minutes of  aerobic without signs/symptoms of physical distress  Continue with 30 min of aerobic exercise without signs/symptoms of physical distress.     Intensity  THRR New 107-120-132  THRR unchanged  THRR unchanged       Progression   Progression  Continue to progress workloads to maintain intensity  without signs/symptoms of physical distress.  Continue to progress workloads to maintain intensity without signs/symptoms of physical distress.  Continue to progress workloads to maintain intensity without signs/symptoms of physical distress.     Average METs  2.05  2.65  2.5       Resistance Training   Training Prescription  Yes  Yes  Yes     Weight  2  3  3      Reps  10-15  10-15  10-15        NuStep   Level  1  2  2      SPM  70  75  71     Minutes  22  22  22      METs  1.9  2.2  1.8       Recumbant Elliptical   Level  1  1  2      RPM  37  41  45     Watts  32  54  59     Minutes  17  17  17      METs  2.2  3.1  3.2       Home Exercise Plan   Plans to continue exercise at  -  -  Home (comment)     Frequency  -  -  Add 2 additional days to program exercise sessions.     Initial Home Exercises Provided  -  -  03/20/18        Exercise Comments:  Exercise Comments    Row Name 04/04/18 1023 04/27/18 0816 05/17/18 1459       Exercise Comments  Patient recently began program. He has finished one complete exercise session. We will monitor and progress as tolerated.   Pt. has been doing well in the program, he has attended 7 sessions. He has tolerated the exercise well. We will continue to monitor progress.   Pt. has attended 10 sessions so far. He continues to tolerated the exercise well and feels as if he is getting stronger and beginning to breath better at home.         Exercise Goals and Review:  Exercise Goals    Row Name 03/20/18 1355             Exercise Goals   Increase Physical Activity  Yes       Intervention  Provide advice, education, support and counseling about physical activity/exercise needs.;Develop an individualized exercise prescription for aerobic and resistive training based on initial evaluation findings, risk stratification, comorbidities and participant's personal goals.       Expected Outcomes  Short Term: Attend rehab on a regular basis to increase amount of physical activity.       Increase Strength and Stamina  Yes       Intervention  Provide advice, education, support and counseling about physical activity/exercise needs.;Develop an individualized exercise prescription for aerobic and resistive training based on initial evaluation findings, risk stratification, comorbidities and participant's personal goals.       Expected Outcomes  Short  Term: Increase workloads from initial exercise prescription for resistance, speed, and METs.       Able to understand and use rate of perceived exertion (RPE) scale  Yes       Intervention  Provide education and explanation on how to use RPE scale       Expected Outcomes  Short Term: Able to use RPE daily in rehab to express subjective intensity level;Long Term:  Able to  use RPE to guide intensity level when exercising independently       Able to understand and use Dyspnea scale  Yes       Intervention  Provide education and explanation on how to use Dyspnea scale       Expected Outcomes  Short Term: Able to use Dyspnea scale daily in rehab to express subjective sense of shortness of breath during exertion;Long Term: Able to use Dyspnea scale to guide intensity level when exercising independently       Knowledge and understanding of Target Heart Rate Range (THRR)  Yes       Intervention  Provide education and explanation of THRR including how the numbers were predicted and where they are located for reference       Expected Outcomes  Short Term: Able to state/look up THRR;Long Term: Able to use THRR to govern intensity when exercising independently;Short Term: Able to use daily as guideline for intensity in rehab       Able to check pulse independently  Yes       Intervention  Provide education and demonstration on how to check pulse in carotid and radial arteries.;Review the importance of being able to check your own pulse for safety during independent exercise       Expected Outcomes  Short Term: Able to explain why pulse checking is important during independent exercise;Long Term: Able to check pulse independently and accurately       Understanding of Exercise Prescription  Yes       Intervention  Provide education, explanation, and written materials on patient's individual exercise prescription       Expected Outcomes  Short Term: Able to explain program exercise prescription;Long Term: Able to  explain home exercise prescription to exercise independently          Exercise Goals Re-Evaluation : Exercise Goals Re-Evaluation    Row Name 04/04/18 1022 04/27/18 0814 05/17/18 1457         Exercise Goal Re-Evaluation   Exercise Goals Review  Increase Strength and Stamina;Increase Physical Activity;Able to understand and use rate of perceived exertion (RPE) scale;Knowledge and understanding of Target Heart Rate Range (THRR);Understanding of Exercise Prescription;Able to check pulse independently;Able to understand and use Dyspnea scale  Increase Strength and Stamina;Increase Physical Activity;Able to understand and use rate of perceived exertion (RPE) scale;Knowledge and understanding of Target Heart Rate Range (THRR);Understanding of Exercise Prescription;Able to check pulse independently;Able to understand and use Dyspnea scale  Increase Strength and Stamina;Increase Physical Activity;Able to understand and use rate of perceived exertion (RPE) scale;Knowledge and understanding of Target Heart Rate Range (THRR);Understanding of Exercise Prescription;Able to check pulse independently;Able to understand and use Dyspnea scale     Comments  Patient has just recently began the program, he has had one full exercise visit. We will continue to monitor and progress as tolerated.   Pt. has been doing well in the program so far. He has tolerated the exercise well and has stated his breathing is already improving with ADLs.   Pt. continues to do well in PR. He has worked up to level 2 on both the NuStep and Recumbant Elliptical.      Expected Outcomes  Increase activity level.   Increase activity level.   Increase activity level.         Discharge Exercise Prescription (Final Exercise Prescription Changes): Exercise Prescription Changes - 05/11/18 1000      Response to Exercise   Blood Pressure (Admit)  118/64  Blood Pressure (Exercise)  128/66    Blood Pressure (Exit)  110/64    Heart Rate (Admit)   87 bpm    Heart Rate (Exercise)  110 bpm    Heart Rate (Exit)  97 bpm    Oxygen Saturation (Admit)  92 %    Oxygen Saturation (Exercise)  91 %    Oxygen Saturation (Exit)  91 %    Rating of Perceived Exertion (Exercise)  12    Perceived Dyspnea (Exercise)  12    Duration  Continue with 30 min of aerobic exercise without signs/symptoms of physical distress.    Intensity  THRR unchanged      Progression   Progression  Continue to progress workloads to maintain intensity without signs/symptoms of physical distress.    Average METs  2.5      Resistance Training   Training Prescription  Yes    Weight  3    Reps  10-15      NuStep   Level  2    SPM  71    Minutes  22    METs  1.8      Recumbant Elliptical   Level  2    RPM  45    Watts  59    Minutes  17    METs  3.2      Home Exercise Plan   Plans to continue exercise at  Home (comment)    Frequency  Add 2 additional days to program exercise sessions.    Initial Home Exercises Provided  03/20/18       Nutrition:  Target Goals: Understanding of nutrition guidelines, daily intake of sodium 1500mg , cholesterol 200mg , calories 30% from fat and 7% or less from saturated fats, daily to have 5 or more servings of fruits and vegetables.  Biometrics: Pre Biometrics - 03/20/18 1355      Pre Biometrics   Height  5\' 11"  (1.803 m)    Waist Circumference  44 inches    Hip Circumference  43.5 inches    Waist to Hip Ratio  1.01 %    Triceps Skinfold  10 mm    % Body Fat  28.6 %    Grip Strength  18.7 kg    Single Leg Stand  1.5 seconds        Nutrition Therapy Plan and Nutrition Goals: Nutrition Therapy & Goals - 05/18/18 1507      Nutrition Therapy   RD appointment deferred  Yes      Personal Nutrition Goals   Comments  Patient did not attend RD appointment in January. He says he has not changed his diet since he started the program.       Intervention Plan   Intervention  Nutrition handout(s) given to patient.        Nutrition Assessments: Nutrition Assessments - 03/20/18 1459      MEDFICTS Scores   Pre Score  67       Nutrition Goals Re-Evaluation:   Nutrition Goals Discharge (Final Nutrition Goals Re-Evaluation):   Psychosocial: Target Goals: Acknowledge presence or absence of significant depression and/or stress, maximize coping skills, provide positive support system. Participant is able to verbalize types and ability to use techniques and skills needed for reducing stress and depression.  Initial Review & Psychosocial Screening: Initial Psych Review & Screening - 03/20/18 1456      Initial Review   Current issues with  None Identified      Family Dynamics  Good Support System?  Yes    Concerns  Recent loss of child   Patient seems to be coping with this tragedy.     Barriers   Psychosocial barriers to participate in program  There are no identifiable barriers or psychosocial needs.      Screening Interventions   Interventions  Encouraged to exercise    Expected Outcomes  Short Term goal: Identification and review with participant of any Quality of Life or Depression concerns found by scoring the questionnaire.;Long Term goal: The participant improves quality of Life and PHQ9 Scores as seen by post scores and/or verbalization of changes       Quality of Life Scores: Quality of Life - 03/20/18 1356      Quality of Life   Select  Quality of Life      Quality of Life Scores   Health/Function Pre  23.96 %    Socioeconomic Pre  25.5 %    Psych/Spiritual Pre  25.6 %    Family Pre  30 %    GLOBAL Pre  25.23 %      Scores of 19 and below usually indicate a poorer quality of life in these areas.  A difference of  2-3 points is a clinically meaningful difference.  A difference of 2-3 points in the total score of the Quality of Life Index has been associated with significant improvement in overall quality of life, self-image, physical symptoms, and general health in studies  assessing change in quality of life.   PHQ-9: Recent Review Flowsheet Data    Depression screen [facility] 2/9 03/20/2018 11/26/2015   Decreased Interest 0 0   Down, Depressed, Hopeless 0 0   PHQ - 2 Score 0 0   Altered sleeping 0 0   Tired, decreased energy 0 1   Change in appetite 0 1   Feeling bad or failure about yourself  1 -   Trouble concentrating 0 0   Moving slowly or fidgety/restless 0 0   Suicidal thoughts 0 0   PHQ-9 Score 1 2   Difficult doing work/chores Not difficult at all Not difficult at all     Interpretation of Total Score  Total Score Depression Severity:  1-4 = Minimal depression, 5-9 = Mild depression, 10-14 = Moderate depression, 15-19 = Moderately severe depression, 20-27 = Severe depression   Psychosocial Evaluation and Intervention: Psychosocial Evaluation - 03/20/18 1458      Psychosocial Evaluation & Interventions   Interventions  Encouraged to exercise with the program and follow exercise prescription    Continue Psychosocial Services   No Follow up required       Psychosocial Re-Evaluation: Psychosocial Re-Evaluation    Tool Name 04/27/18 1530 05/18/18 1509           Psychosocial Re-Evaluation   Current issues with  None Identified  None Identified      Comments  Patient's QOL score was 25.43 and PHQ-9 score was 2. He has no psychosocial issues identified.   Patient's QOL score was 25.43 and PHQ-9 score was 2. He has no psychosocial issues identified.       Expected Outcomes  Patient will have no psychosocial issues identified at discharge.   Patient will have no psychosocial issues identified at discharge.       Interventions  Stress management education;Relaxation education;Encouraged to attend Pulmonary Rehabilitation for the exercise  Stress management education;Relaxation education;Encouraged to attend Pulmonary Rehabilitation for the exercise      Continue Psychosocial Services  No Follow up required  No Follow up required          Psychosocial Discharge (Final Psychosocial Re-Evaluation): Psychosocial Re-Evaluation - 05/18/18 1509      Psychosocial Re-Evaluation   Current issues with  None Identified    Comments  Patient's QOL score was 25.43 and PHQ-9 score was 2. He has no psychosocial issues identified.     Expected Outcomes  Patient will have no psychosocial issues identified at discharge.     Interventions  Stress management education;Relaxation education;Encouraged to attend Pulmonary Rehabilitation for the exercise    Continue Psychosocial Services   No Follow up required        Education: Education Goals: Education classes will be provided on a weekly basis, covering required topics. Participant will state understanding/return demonstration of topics presented.  Learning Barriers/Preferences: Learning Barriers/Preferences - 03/20/18 1447      Learning Barriers/Preferences   Learning Barriers  None    Learning Preferences  Individual Instruction;Group Instruction;Skilled Demonstration       Education Topics: How Lungs Work and Diseases: - Discuss the anatomy of the lungs and diseases that can affect the lungs, such as COPD.   Exercise: -Discuss the importance of exercise, FITT principles of exercise, normal and abnormal responses to exercise, and how to exercise safely.   Environmental Irritants: -Discuss types of environmental irritants and how to limit exposure to environmental irritants.   Meds/Inhalers and oxygen: - Discuss respiratory medications, definition of an inhaler and oxygen, and the proper way to use an inhaler and oxygen.   Energy Saving Techniques: - Discuss methods to conserve energy and decrease shortness of breath when performing activities of daily living.    Bronchial Hygiene / Breathing Techniques: - Discuss breathing mechanics, pursed-lip breathing technique,  proper posture, effective ways to clear airways, and other functional breathing techniques    PULMONARY REHAB OTHER RESPIRATORY from 05/11/2018 in [place]  Date  04/13/18  Educator  Wynetta Emery  Instruction Review Code  2- Demonstrated Understanding      Cleaning Equipment: - Provides group verbal and written instruction about the health risks of elevated stress, cause of high stress, and healthy ways to reduce stress.   Nutrition I: Fats: - Discuss the types of cholesterol, what cholesterol does to the body, and how cholesterol levels can be controlled.   PULMONARY REHAB OTHER RESPIRATORY from 05/11/2018 in [place]  Date  04/27/18  Educator  Wynetta Emery  Instruction Review Code  2- Demonstrated Understanding      Nutrition II: Labels: -Discuss the different components of food labels and how to read food labels.   PULMONARY REHAB OTHER RESPIRATORY from 05/11/2018 in [place]  Date  05/04/18  Educator  D. Coad  Instruction Review Code  2- Demonstrated Understanding      Respiratory Infections: - Discuss the signs and symptoms of respiratory infections, ways to prevent respiratory infections, and the importance of seeking medical treatment when having a respiratory infection.   Stress I: Signs and Symptoms: - Discuss the causes of stress, how stress may lead to anxiety and depression, and ways to limit stress.   Stress II: Relaxation: -Discuss relaxation techniques to limit stress.   Oxygen for Home/Travel: - Discuss how to prepare for travel when on oxygen and proper ways to transport and store oxygen to ensure safety.   Knowledge Questionnaire Score: Knowledge Questionnaire Score - 03/20/18 1448      Knowledge Questionnaire Score   Pre Score  15/17       Core Components/Risk Factors/Patient Goals at Admission: Personal Goals and Risk Factors at Admission - 03/20/18 1459      Core Components/Risk Factors/Patient Goals on Admission    Weight Management  Weight Maintenance    Personal Goal  Other  Yes    Personal Goal  Increase his activity    Intervention  Come to [facility] 3 days/week and supplement with 2 more days at home.     Expected Outcomes  To reach personal goals.        Core Components/Risk Factors/Patient Goals Review:  Goals and Risk Factor Review    Row Name 04/27/18 1528 05/18/18 1507           Core Components/Risk Factors/Patient Goals Review   Personal Goals Review  Weight Management/Obesity;Improve shortness of breath with ADL's Gain strength; increase activities.   Weight Management/Obesity;Improve shortness of breath with ADL's Gain strength; increase activities.       Review  Patient has completed 8 sessions gaining 8 lbs since he started the program. He admitts to needing to work on his diet and blames it on the holiday season. He is doing well in the program with progression. He says he is feeling somewhat stronger and hopes to continue to improve as the continues to attend. Will continue to monitor for progress.   Patient has completed 10 session losing 4 lbs since his last 30 day review. He is doing well in the program with progression but has been out for the past week with URI. He says this has helped him get moving and start an exercise program he hopes to continue. Will continue to monitor for progress.       Expected Outcomes  Patient will continue to attend sessions and complete the program meeting his personal goals.   Patient will continue to attend sessions and complete the program meeting his personal goals.          Core Components/Risk Factors/Patient Goals at Discharge (Final Review):  Goals and Risk Factor Review - 05/18/18 1507      Core Components/Risk Factors/Patient Goals Review   Personal Goals Review  Weight Management/Obesity;Improve shortness of breath with ADL's   Gain strength; increase activities.    Review  Patient has completed 10 session losing 4 lbs since his last 30 day review. He is doing well in the program with progression  but has been out for the past week with URI. He says this has helped him get moving and start an exercise program he hopes to continue. Will continue to monitor for progress.     Expected Outcomes  Patient will continue to attend sessions and complete the program meeting his personal goals.        ITP Comments: ITP Comments    Row Name 03/20/18 1512 04/10/18 1415         ITP Comments  Patient has been in our program before with his heart. He is returning with emphysema. He is now retired and plans to complete the program this time.   Patient is new to program. He has completed 3 sessions. Will continue to monitor for progress.          Comments: ITP REVIEW Pt is making expected progress toward pulmonary rehab goals after completing 10 sessions. Recommend continued exercise, life style modification, education, and utilization of breathing techniques to increase stamina and strength and decrease shortness of breath with exertion.

## 2018-05-23 ENCOUNTER — Ambulatory Visit (INDEPENDENT_AMBULATORY_CARE_PROVIDER_SITE_OTHER): Payer: Medicare Other | Admitting: Pharmacist

## 2018-05-23 ENCOUNTER — Encounter (HOSPITAL_COMMUNITY): Payer: Medicare Other

## 2018-05-23 DIAGNOSIS — I4891 Unspecified atrial fibrillation: Secondary | ICD-10-CM

## 2018-05-23 DIAGNOSIS — Z5181 Encounter for therapeutic drug level monitoring: Secondary | ICD-10-CM

## 2018-05-23 DIAGNOSIS — I4892 Unspecified atrial flutter: Secondary | ICD-10-CM

## 2018-05-23 LAB — POCT INR: INR: 2.1 (ref 2.0–3.0)

## 2018-05-23 NOTE — Patient Instructions (Signed)
Description   Continue 1 tablet daily except 1 1/2 tablets on Tuesdays, Thursdays and Saturdays Recheck in 4 weeks      

## 2018-05-24 ENCOUNTER — Encounter: Payer: Self-pay | Admitting: Orthopedic Surgery

## 2018-05-24 ENCOUNTER — Ambulatory Visit (INDEPENDENT_AMBULATORY_CARE_PROVIDER_SITE_OTHER): Payer: Medicare Other | Admitting: Orthopedic Surgery

## 2018-05-24 ENCOUNTER — Encounter

## 2018-05-24 ENCOUNTER — Ambulatory Visit (INDEPENDENT_AMBULATORY_CARE_PROVIDER_SITE_OTHER): Payer: Medicare Other

## 2018-05-24 VITALS — BP 104/58 | HR 83 | Ht 71.0 in | Wt 210.0 lb

## 2018-05-24 DIAGNOSIS — M5410 Radiculopathy, site unspecified: Secondary | ICD-10-CM | POA: Diagnosis not present

## 2018-05-24 DIAGNOSIS — M25551 Pain in right hip: Secondary | ICD-10-CM

## 2018-05-24 DIAGNOSIS — M5136 Other intervertebral disc degeneration, lumbar region: Secondary | ICD-10-CM | POA: Diagnosis not present

## 2018-05-24 MED ORDER — GABAPENTIN 100 MG PO CAPS: 100.0000 mg | ORAL_CAPSULE | Freq: Three times a day (TID) | ORAL | 2 refills | Status: DC

## 2018-05-24 NOTE — Progress Notes (Signed)
Progress Note   Patient ID: Christian Rasmussen, male   DOB: 1940-06-13, 78 y.o.   MRN: [id number]   Chief Complaint  Patient presents with  . Back Pain    HPI The patient presents for evaluation of  back pain.  He is 78 years old he has some congestive heart failure.  He presents with a greater than one-year history of lower back pain right hip pain right thigh pain and right calf pain for which she has taken ibuprofen with some relief but incomplete he also uses Biofreeze which helps.  He has trouble walking.  The pain is just above the trochanter and above the right gluteal region he rates his pain a 10 out of 10 at its worse he has not had any treatment   Review of Systems  Constitutional: Positive for weight loss. Negative for chills, fever and malaise/fatigue.  Respiratory: Positive for shortness of breath.   Gastrointestinal: Negative.   Genitourinary: Negative.   Neurological: Positive for sensory change. Negative for tingling.       Diabetic neuropathy bilateral   Current Meds  Medication Sig  . amLODipine (NORVASC) 10 MG tablet Take 1 tablet (10 mg total) by mouth daily.  Marland Kitchen aspirin EC 81 MG tablet Take 1 tablet (81 mg total) by mouth daily.  . ASTAXANTHIN PO Take 10 mg by mouth daily.   Marland Kitchen atorvastatin (LIPITOR) 40 MG tablet Take 40 mg by mouth daily.   . carvedilol (COREG) 6.25 MG tablet TAKE 2 TABLETS TWICE DAILY  . Coenzyme Q10 200 MG capsule Take 200 mg by mouth daily.   . furosemide (LASIX) 40 MG tablet   . lisinopril (PRINIVIL,ZESTRIL) 20 MG tablet Take 1 tablet (20 mg total) by mouth daily.  . metFORMIN (GLUCOPHAGE) 500 MG tablet Take 1,000 mg by mouth 2 (two) times daily with a meal.   . potassium chloride (KLOR-CON 10) 10 MEQ tablet Take 1 tablet (10 mEq total) by mouth 2 (two) times daily.  Marland Kitchen warfarin (COUMADIN) 5 MG tablet Take 1 1/2 tablets daily except 1 tablet on Mondays and Fridays    Past Medical History:  Diagnosis Date  . Aortic insufficiency 10/15/2015   mild  (1+/2+) by TEE  . Aortic stenosis 10/14/2015  . CAD (coronary artery disease)    status post Taxus stent patency of RCA 2004, Cardiolite negative for ischemia in 2010.  . Diabetes mellitus ([place])   . Dyslipidemia   . History of kidney stones   . Hypertension   . Iron deficiency anemia 02/02/2018  . Mitral regurgitation 10/15/2015   Moderate-severe by intra-operative TEE  . Overweight   . Right bundle branch block (RBBB) with left anterior hemiblock   . 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 Snores   . 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  He has normal strength in both feet skin is normal in both lower extremities and the back including the thoracic and lumbar spine  Distally he has no edema today temperature of his legs is normal.  Sensory exam is normal by Thornell Mule testing bilaterally deep tendon reflexes are 2+ at the knee 0 at the ankle with no pathologic reflexes and a mildly positive right straight leg raise    MEDICAL DECISION MAKING   Imaging:  Last year we did a back x-ray he had severe degenerative disc disease scoliosis and spondylosis  X-ray pelvis and right hip: Minimal degenerative changes in the right  hip  Encounter Diagnoses  Name Primary?  . Right hip pain Yes  . Lumbar degenerative disc disease   . Radicular syndrome of right leg      PLAN: (RX., injection, surgery,frx,mri/ct, XR 2 body ares) Gabapentin 100 mg 3 times a day  Start physical therapy  Follow-up 6 weeks  Meds ordered this encounter  Medications  . gabapentin (NEURONTIN) 100 MG capsule    Sig: Take 1 capsule (100 mg total) by mouth 3 (three) times daily.    Dispense:  90 capsule    Refill:  2   10:25 AM 05/24/2018

## 2018-05-24 NOTE — Patient Instructions (Addendum)
PHYSICAL THERAPY   START GABAPENTIN    Radicular Pain Radicular pain is a type of pain that spreads from your back or neck along a spinal nerve. Spinal nerves are nerves that leave the spinal cord and go to the muscles. Radicular pain is sometimes called radiculopathy, radiculitis, or a pinched nerve. When you have this type of pain, you may also have weakness, numbness, or tingling in the area of your body that is supplied by the nerve. The pain may feel sharp and burning. Depending on which spinal nerve is affected, the pain may occur in the:  Neck area (cervical radicular pain). You may also feel pain, numbness, weakness, or tingling in the arms.  Mid-spine area (thoracic radicular pain). You would feel this pain in the back and chest. This type is rare.  Lower back area (lumbar radicular pain). You would feel this pain as low back pain. You may feel pain, numbness, weakness, or tingling in the buttocks or legs. Sciatica is a type of lumbar radicular pain that shoots down the back of the leg. Radicular pain occurs when one of the spinal nerves becomes irritated or squeezed (compressed). It is often caused by something pushing on a spinal nerve, such as one of the bones of the spine (vertebrae) or one of the round cushions between vertebrae (intervertebral disks). This can result from:  An injury.  Wear and tear or aging of a disk.  The growth of a bone spur that pushes on the nerve. Radicular pain often goes away when you follow instructions from your health care provider for relieving pain at home. Follow these instructions at home: Managing pain      If directed, put ice on the affected area: ? Put ice in a plastic bag. ? Place a towel between your skin and the bag. ? Leave the ice on for 20 minutes, 2-3 times a day.  If directed, apply heat to the affected area as often as told by your health care provider. Use the heat source that your health care provider recommends, such as a  moist heat pack or a heating pad. ? Place a towel between your skin and the heat source. ? Leave the heat on for 20-30 minutes. ? Remove the heat if your skin turns bright red. This is especially important if you are unable to feel pain, heat, or cold. You may have a greater risk of getting burned. Activity   Do not sit or rest in bed for long periods of time.  Try to stay as active as possible. Ask your health care provider what type of exercise or activity is best for you.  Avoid activities that make your pain worse, such as bending and lifting.  Do not lift anything that is heavier than 10 lb (4.5 kg), or the limit that you are told, until your health care provider says that it is safe.  Practice using proper technique when lifting items. Proper lifting technique involves bending your knees and rising up.  Do strength and range-of-motion exercises only as told by your health care provider or physical therapist. General instructions  Take over-the-counter and prescription medicines only as told by your health care provider.  Pay attention to any changes in your symptoms.  Keep all follow-up visits as told by your health care provider. This is important. ? Your health care provider may send you to a physical therapist to help with this pain. Contact a health care provider if:  Your pain and other  symptoms get worse.  Your pain medicine is not helping.  Your pain has not improved after a few weeks of home care.  You have a fever. Get help right away if:  You have severe pain, weakness, or numbness.  You have difficulty with bladder or bowel control. Summary  Radicular pain is a type of pain that spreads from your back or neck along a spinal nerve.  When you have radicular pain, you may also have weakness, numbness, or tingling in the area of your body that is supplied by the nerve.  The pain may feel sharp or burning.  Radicular pain may be treated with ice, heat,  medicines, or physical therapy. This information is not intended to replace advice given to you by your health care provider. Make sure you discuss any questions you have with your health care provider. Document Released: 06/03/2004 Document Revised: 11/08/2017 Document Reviewed: 11/08/2017 Elsevier Interactive Patient Education  2019 [employer]  Chronic Back Pain When back pain lasts longer than 3 months, it is called chronic back pain.The cause of your back pain may not be known. Some common causes include:  Wear and tear (degenerative disease) of the bones, ligaments, or disks in your back.  Inflammation and stiffness in your back (arthritis). People who have chronic back pain often go through certain periods in which the pain is more intense (flare-ups). Many people can learn to manage the pain with home care. Follow these instructions at home: Pay attention to any changes in your symptoms. Take these actions to help with your pain: Activity   Avoid bending and other activities that make the problem worse.  Maintain a proper position when standing or sitting: ? When standing, keep your upper back and neck straight, with your shoulders pulled back. Avoid slouching. ? When sitting, keep your back straight and relax your shoulders. Do not round your shoulders or pull them backward.  Do not sit or stand in one place for long periods of time.  Take brief periods of rest throughout the day. This will reduce your pain. Resting in a lying or standing position is usually better than sitting to rest.  When you are resting for longer periods, mix in some mild activity or stretching between periods of rest. This will help to prevent stiffness and pain.  Get regular exercise. Ask your health care provider what activities are safe for you.  Do not lift anything that is heavier than 10 lb (4.5 kg). Always use proper lifting technique, which includes: ? Bending your knees. ? Keeping the  load close to your body. ? Avoiding twisting.  Sleep on a firm mattress in a comfortable position. Try lying on your side with your knees slightly bent. If you lie on your back, put a pillow under your knees. Managing pain  If directed, apply ice to the painful area. Your health care provider may recommend applying ice during the first 24-48 hours after a flare-up begins. ? Put ice in a plastic bag. ? Place a towel between your skin and the bag. ? Leave the ice on for 20 minutes, 2-3 times per day.  If directed, apply heat to the affected area as often as told by your health care provider. Use the heat source that your health care provider recommends, such as a moist heat pack or a heating pad. ? Place a towel between your skin and the heat source. ? Leave the heat on for 20-30 minutes. ? Remove the heat if your  skin turns bright red. This is especially important if you are unable to feel pain, heat, or cold. You may have a greater risk of getting burned.  Try soaking in a warm tub.  Take over-the-counter and prescription medicines only as told by your health care provider.  Keep all follow-up visits as told by your health care provider. This is important. Contact a health care provider if:  You have pain that is not relieved with rest or medicine. Get help right away if:  You have weakness or numbness in one or both of your legs or feet.  You have trouble controlling your bladder or your bowels.  You have nausea or vomiting.  You have pain in your abdomen.  You have shortness of breath or you faint. This information is not intended to replace advice given to you by your health care provider. Make sure you discuss any questions you have with your health care provider. Document Released: 06/03/2004 Document Revised: 12/01/2017 Document Reviewed: 11/03/2016 Elsevier Interactive Patient Education  2019 [employer].

## 2018-05-24 NOTE — Addendum Note (Signed)
Addended byCandice Camp on: 05/24/2018 10:38 AM   Modules accepted: Orders

## 2018-05-25 ENCOUNTER — Encounter (HOSPITAL_COMMUNITY)
Admission: RE | Admit: 2018-05-25 | Discharge: 2018-05-25 | Disposition: A | Discharge status: Home / Self Care | Payer: Medicare Other | Source: Ambulatory Visit | Attending: Pulmonary Disease | Admitting: Pulmonary Disease

## 2018-05-25 DIAGNOSIS — J432 Centrilobular emphysema: Secondary | ICD-10-CM

## 2018-05-25 NOTE — Progress Notes (Signed)
Daily Session Note  Patient Details  Name: JAYMERE ALEN MRN: [id number] Date of Birth: 1940/08/07 Referring Provider:     PULMONARY REHAB OTHER RESP ORIENTATION from 03/20/2018 in [place]  Referring Provider  McQuaid      Encounter Date: 05/25/2018  Check In: Session Check In - 05/25/18 1045      Check-In   Supervising physician immediately available to respond to emergencies  See telemetry face sheet for immediately available MD    Location  [facility]    Staff Present  Benay Pike, Exercise Physiologist;Other    Medication changes reported      No    Fall or balance concerns reported     No    Warm-up and Cool-down  Performed as group-led instruction    Resistance Training Performed  Yes    VAD Patient?  No    PAD/SET Patient?  No      Pain Assessment   Currently in Pain?  No/denies    Pain Score  0-No pain    Multiple Pain Sites  No       Capillary Blood Glucose: No results found for this or any previous visit (from the past 24 hour(s)).    Social History   Tobacco Use  Smoking Status Former Smoker  . Packs/day: 1.00  . Years: 28.00  . Pack years: 28.00  . Types: Cigarettes  . Start date: 10/21/1956  . Last attempt to quit: 05/10/1985  . Years since quitting: 33.0  Smokeless Tobacco Never Used    Goals Met:  Proper associated with RPD/PD & O2 Sat Independence with exercise equipment Using PLB without cueing & demonstrates good technique Exercise tolerated well No report of cardiac concerns or symptoms Strength training completed today  Goals Unmet:  Not Applicable  Comments: Pt able to follow exercise prescription today without complaint.  Will continue to monitor for progression. Check out 1145.   Dr. Sinda Du is Medical Director for [facility].

## 2018-05-30 ENCOUNTER — Encounter (HOSPITAL_COMMUNITY)
Admission: RE | Admit: 2018-05-30 | Discharge: 2018-05-30 | Disposition: A | Discharge status: Home / Self Care | Payer: Medicare Other | Source: Ambulatory Visit | Attending: Pulmonary Disease | Admitting: Pulmonary Disease

## 2018-05-30 DIAGNOSIS — J432 Centrilobular emphysema: Secondary | ICD-10-CM

## 2018-05-30 NOTE — Progress Notes (Signed)
Daily Session Note  Patient Details  Name: Christian Rasmussen MRN: [id number] Date of Birth: 07/25/1940 Referring Provider:     PULMONARY REHAB OTHER RESP ORIENTATION from 03/20/2018 in [place]  Referring Provider  McQuaid      Encounter Date: 05/30/2018  Check In: Session Check In - 05/30/18 1330      Check-In   Supervising physician immediately available to respond to emergencies  See telemetry face sheet for immediately available MD    Location  [facility]    Staff Present  Russella Dar, MS, EP, [facility], Exercise Physiologist;Priscilla Kirstein Zachery Conch, Exercise Physiologist    Medication changes reported      No    Fall or balance concerns reported     No    Warm-up and Cool-down  Performed as group-led instruction    Resistance Training Performed  Yes    VAD Patient?  No    PAD/SET Patient?  No      Pain Assessment   Currently in Pain?  No/denies    Pain Score  0-No pain    Multiple Pain Sites  No       Capillary Blood Glucose: No results found for this or any previous visit (from the past 24 hour(s)).    Social History   Tobacco Use  Smoking Status Former Smoker  . Packs/day: 1.00  . Years: 28.00  . Pack years: 28.00  . Types: Cigarettes  . Start date: 10/21/1956  . Last attempt to quit: 05/10/1985  . Years since quitting: 33.0  Smokeless Tobacco Never Used    Goals Met:  Proper associated with RPD/PD & O2 Sat Independence with exercise equipment Using PLB without cueing & demonstrates good technique Exercise tolerated well No report of cardiac concerns or symptoms Strength training completed today  Goals Unmet:  Not Applicable  Comments: Pt able to follow exercise prescription today without complaint.  Will continue to monitor for progression. Check out 1430.   Dr. Sinda Du is Medical Director for [facility].

## 2018-06-01 ENCOUNTER — Encounter (HOSPITAL_COMMUNITY)
Admission: RE | Admit: 2018-06-01 | Discharge: 2018-06-01 | Disposition: A | Discharge status: Home / Self Care | Payer: Medicare Other | Source: Ambulatory Visit | Attending: Pulmonary Disease | Admitting: Pulmonary Disease

## 2018-06-01 DIAGNOSIS — J432 Centrilobular emphysema: Secondary | ICD-10-CM

## 2018-06-01 DIAGNOSIS — M545 Low back pain: Secondary | ICD-10-CM | POA: Diagnosis not present

## 2018-06-01 NOTE — Progress Notes (Signed)
Daily Session Note  Patient Details  Name: Christian Rasmussen MRN: [id number] Date of Birth: 1940-09-15 Referring Provider:     PULMONARY REHAB OTHER RESP ORIENTATION from 03/20/2018 in [place]  Referring Provider  McQuaid      Encounter Date: 06/01/2018  Check In: Session Check In - 06/01/18 1045      Check-In   Supervising physician immediately available to respond to emergencies  See telemetry face sheet for immediately available MD    Location  [facility]    Staff Present  Russella Dar, MS, EP, [facility], Exercise Physiologist;Amanda Ballard, Exercise Physiologist;Jeanetta Alonzo Wynetta Emery, RN, BSN    Medication changes reported      No    Fall or balance concerns reported     No    Warm-up and Cool-down  Performed as group-led instruction    Resistance Training Performed  Yes    VAD Patient?  No    PAD/SET Patient?  No      Pain Assessment   Currently in Pain?  No/denies    Pain Score  0-No pain    Multiple Pain Sites  No       Capillary Blood Glucose: No results found for this or any previous visit (from the past 24 hour(s)).    Social History   Tobacco Use  Smoking Status Former Smoker  . Packs/day: 1.00  . Years: 28.00  . Pack years: 28.00  . Types: Cigarettes  . Start date: 10/21/1956  . Last attempt to quit: 05/10/1985  . Years since quitting: 33.0  Smokeless Tobacco Never Used    Goals Met:  Proper associated with RPD/PD & O2 Sat Independence with exercise equipment Improved SOB with ADL's Using PLB without cueing & demonstrates good technique Exercise tolerated well No report of cardiac concerns or symptoms Strength training completed today  Goals Unmet:  Not Applicable  Comments: Pt able to follow exercise prescription today without complaint.  Will continue to monitor for progression. Check out 1145.   Dr. Sinda Du is Medical Director for [facility].

## 2018-06-04 LAB — CUP PACEART REMOTE DEVICE CHECK
Date Time Interrogation Session: 20200126061432
Implantable Lead Implant Date: 20170612
Implantable Lead Location: 753860
Implantable Lead Model: 7741
Implantable Lead Model: 7742
Implantable Lead Serial Number: 733074
Implantable Lead Serial Number: 760165
Implantable Pulse Generator Implant Date: 20170612
MDC IDC LEAD IMPLANT DT: 20170612
MDC IDC LEAD LOCATION: 753859
Pulse Gen Serial Number: 724121

## 2018-06-06 ENCOUNTER — Encounter (HOSPITAL_COMMUNITY)
Admission: RE | Admit: 2018-06-06 | Discharge: 2018-06-06 | Disposition: A | Discharge status: Home / Self Care | Payer: Medicare Other | Source: Ambulatory Visit | Attending: Pulmonary Disease | Admitting: Pulmonary Disease

## 2018-06-06 DIAGNOSIS — J432 Centrilobular emphysema: Secondary | ICD-10-CM | POA: Diagnosis not present

## 2018-06-06 DIAGNOSIS — M545 Low back pain: Secondary | ICD-10-CM | POA: Diagnosis not present

## 2018-06-06 NOTE — Progress Notes (Signed)
Daily Session Note  Patient Details  Name: Christian Rasmussen MRN: [id number] Date of Birth: [birth year] Referring Provider:     PULMONARY REHAB OTHER RESP ORIENTATION from 03/20/2018 in [place]  Referring Provider  McQuaid      Encounter Date: 06/06/2018  Check In: Session Check In - 06/06/18 1045      Check-In   Supervising physician immediately available to respond to emergencies  See telemetry face sheet for immediately available MD    Location  [facility]    Staff Present  Russella Dar, MS, EP, [facility], Exercise Physiologist;[place] Zachery Conch, Exercise Physiologist    Medication changes reported      No    Fall or balance concerns reported     No    Warm-up and Cool-down  Performed as group-led instruction    Resistance Training Performed  Yes    VAD Patient?  No    PAD/SET Patient?  No      Pain Assessment   Currently in Pain?  No/denies    Pain Score  0-No pain    Multiple Pain Sites  No       Capillary Blood Glucose: No results found for this or any previous visit (from the past 24 hour(s)).    Social History   Tobacco Use  Smoking Status Former Smoker  . Packs/day: 1.00  . Years: 28.00  . Pack years: 28.00  . Types: Cigarettes  . Start date: 10/21/1956  . Last attempt to quit: 05/10/1985  . Years since quitting: 33.0  Smokeless Tobacco Never Used    Goals Met:  Proper associated with RPD/PD & O2 Sat Independence with exercise equipment Improved SOB with ADL's Using PLB without cueing & demonstrates good technique Exercise tolerated well No report of cardiac concerns or symptoms Strength training completed today  Goals Unmet:  Not Applicable  Comments: Pt able to follow exercise prescription today without complaint.  Will continue to monitor for progression. Check out 1145.   Dr. Sinda Du is Medical Director for [facility].

## 2018-06-08 ENCOUNTER — Encounter (HOSPITAL_COMMUNITY)
Admission: RE | Admit: 2018-06-08 | Discharge: 2018-06-08 | Disposition: A | Discharge status: Home / Self Care | Payer: Medicare Other | Source: Ambulatory Visit | Attending: Pulmonary Disease | Admitting: Pulmonary Disease

## 2018-06-08 DIAGNOSIS — J432 Centrilobular emphysema: Secondary | ICD-10-CM | POA: Diagnosis not present

## 2018-06-08 DIAGNOSIS — M545 Low back pain: Secondary | ICD-10-CM | POA: Diagnosis not present

## 2018-06-08 NOTE — Progress Notes (Signed)
Daily Session Note  Patient Details  Name: BASSEM BERNASCONI MRN: [id number] Date of Birth: 16-Oct-1940 Referring Provider:     PULMONARY REHAB OTHER RESP ORIENTATION from 03/20/2018 in [place]  Referring Provider  [place]Quaid      Encounter Date: 06/08/2018  Check In: Session Check In - 06/08/18 1045      Check-In   Supervising physician immediately available to respond to emergencies  See telemetry face sheet for immediately available MD    Location  [facility]    Staff Present  Benay Pike, Exercise Physiologist;Debra Wynetta Emery, RN, BSN    Medication changes reported      No    Fall or balance concerns reported     No    Warm-up and Cool-down  Performed as group-led instruction    Resistance Training Performed  Yes    VAD Patient?  No    PAD/SET Patient?  No      Pain Assessment   Currently in Pain?  No/denies    Pain Score  0-No pain    Multiple Pain Sites  No       Capillary Blood Glucose: No results found for this or any previous visit (from the past 24 hour(s)).    Social History   Tobacco Use  Smoking Status Former Smoker  . Packs/day: 1.00  . Years: 28.00  . Pack years: 28.00  . Types: Cigarettes  . Start date: 10/21/1956  . Last attempt to quit: 05/10/1985  . Years since quitting: 33.1  Smokeless Tobacco Never Used    Goals Met:  Proper associated with RPD/PD & O2 Sat Independence with exercise equipment Using PLB without cueing & demonstrates good technique Exercise tolerated well No report of cardiac concerns or symptoms Strength training completed today  Goals Unmet:  Not Applicable  Comments: Pt able to follow exercise prescription today without complaint.  Will continue to monitor for progression. Check out 1145.   Dr. Sinda Du is Medical Director for [facility].

## 2018-06-13 ENCOUNTER — Encounter (HOSPITAL_COMMUNITY)
Admission: RE | Admit: 2018-06-13 | Discharge: 2018-06-13 | Disposition: A | Discharge status: Home / Self Care | Payer: Medicare Other | Source: Ambulatory Visit | Attending: Pulmonary Disease | Admitting: Pulmonary Disease

## 2018-06-13 DIAGNOSIS — J432 Centrilobular emphysema: Secondary | ICD-10-CM

## 2018-06-13 NOTE — Progress Notes (Signed)
Daily Session Note  Patient Details  Name: Christian Rasmussen MRN: [id number] Date of Birth: 1941/03/26 Referring Provider:     PULMONARY REHAB OTHER RESP ORIENTATION from 03/20/2018 in [place]  Referring Provider  [place]Quaid      Encounter Date: 06/13/2018  Check In: Session Check In - 06/13/18 1045      Check-In   Supervising physician immediately available to respond to emergencies  See telemetry face sheet for immediately available MD    Location  [facility]    Staff Present  Benay Pike, Exercise Physiologist;Diane Coad, MS, EP, [facility], Exercise Physiologist    Medication changes reported      No    Fall or balance concerns reported     No    Warm-up and Cool-down  Performed as group-led instruction    Resistance Training Performed  Yes    VAD Patient?  No    PAD/SET Patient?  No      Pain Assessment   Currently in Pain?  No/denies    Pain Score  0-No pain    Multiple Pain Sites  No       Capillary Blood Glucose: No results found for this or any previous visit (from the past 24 hour(s)).    Social History   Tobacco Use  Smoking Status Former Smoker  . Packs/day: 1.00  . Years: 28.00  . Pack years: 28.00  . Types: Cigarettes  . Start date: 10/21/1956  . Last attempt to quit: 05/10/1985  . Years since quitting: 33.1  Smokeless Tobacco Never Used    Goals Met:  Proper associated with RPD/PD & O2 Sat Independence with exercise equipment Improved SOB with ADL's Using PLB without cueing & demonstrates good technique Exercise tolerated well No report of cardiac concerns or symptoms Strength training completed today  Goals Unmet:  Not Applicable  Comments: Pt able to follow exercise prescription today without complaint.  Will continue to monitor for progression. Check out 1145.   Dr. Sinda Du is Medical Director for [facility].

## 2018-06-15 ENCOUNTER — Encounter (HOSPITAL_COMMUNITY): Payer: Medicare Other

## 2018-06-15 NOTE — Progress Notes (Addendum)
Pulmonary Individual Treatment Plan  Patient Details  Name: Christian Rasmussen MRN: [id number] Date of Birth: [birth year] Referring Provider:     PULMONARY REHAB OTHER RESP ORIENTATION from 03/20/2018 in [place]  Referring Provider  McQuaid      Initial Encounter Date:    PULMONARY REHAB OTHER RESP ORIENTATION from 03/20/2018 in [place]  Date  03/20/18      Visit Diagnosis: Centrilobular emphysema ([place])  Patient's Home Medications on Admission:   Current Outpatient Medications:  .  amLODipine (NORVASC) 10 MG tablet, Take 1 tablet (10 mg total) by mouth daily., Disp: 90 tablet, Rfl: 3 .  aspirin EC 81 MG tablet, Take 1 tablet (81 mg total) by mouth daily., Disp: 90 tablet, Rfl: 3 .  ASTAXANTHIN PO, Take 10 mg by mouth daily. , Disp: , Rfl:  .  atorvastatin (LIPITOR) 40 MG tablet, Take 40 mg by mouth daily. , Disp: , Rfl:  .  carvedilol (COREG) 6.25 MG tablet, TAKE 2 TABLETS TWICE DAILY, Disp: 360 tablet, Rfl: 3 .  Coenzyme Q10 200 MG capsule, Take 200 mg by mouth daily. , Disp: , Rfl:  .  furosemide (LASIX) 40 MG tablet, Take 1 tablet (40 mg total) by mouth 2 (two) times daily., Disp: 180 tablet, Rfl: 3 .  furosemide (LASIX) 40 MG tablet, , Disp: , Rfl:  .  gabapentin (NEURONTIN) 100 MG capsule, Take 1 capsule (100 mg total) by mouth 3 (three) times daily., Disp: 90 capsule, Rfl: 2 .  lisinopril (PRINIVIL,ZESTRIL) 20 MG tablet, Take 1 tablet (20 mg total) by mouth daily., Disp: 90 tablet, Rfl: 3 .  metFORMIN (GLUCOPHAGE) 500 MG tablet, Take 1,000 mg by mouth 2 (two) times daily with a meal. , Disp: , Rfl: 2 .  potassium chloride (KLOR-CON 10) 10 MEQ tablet, Take 1 tablet (10 mEq total) by mouth 2 (two) times daily., Disp: 180 tablet, Rfl: 3 .  warfarin (COUMADIN) 5 MG tablet, Take 1 1/2 tablets daily except 1 tablet on Mondays and Fridays, Disp: 135 tablet, Rfl: 3  Past Medical History: Past Medical History:  Diagnosis Date  . Aortic  insufficiency 10/15/2015   mild (1+/2+) by TEE  . Aortic stenosis 10/14/2015  . CAD (coronary artery disease)    status post Taxus stent patency of RCA 2004, Cardiolite negative for ischemia in 2010.  . Diabetes mellitus ([place])   . Dyslipidemia   . History of kidney stones   . Hypertension   . Iron deficiency anemia 02/02/2018  . Mitral regurgitation 10/15/2015   Moderate-severe by intra-operative TEE  . Overweight   . Right bundle branch block (RBBB) with left anterior hemiblock   . 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 Snores   . Typical atrial flutter [facility])    s/p ablation 11-23-2012 by Dr Rayann Heman    Tobacco Use: Social History   Tobacco Use  Smoking Status Former Smoker  . Packs/day: 1.00  . Years: 28.00  . Pack years: 28.00  . Types: Cigarettes  . Start date: 10/21/1956  . Last attempt to quit: 05/10/1985  . Years since quitting: 33.1  Smokeless Tobacco Never Used    Labs: Recent Review Flowsheet  Data    Labs for ITP Cardiac and Pulmonary Rehab Latest Ref Rng & Units 10/16/2015 10/16/2015 10/16/2015 10/16/2015 10/18/2015   Cholestrol 0 - 200 mg/dL - - - - -   LDLCALC 0 - 99 mg/dL - - - - -   HDL >40 mg/dL - - - - -   Trlycerides <150 mg/dL - - - - -   Hemoglobin A1c 4.8 - 5.6 % - - - - -   PHART 7.350 - 7.450 - 7.394 7.415 - -   PCO2ART 35.0 - 45.0 mmHg - 35.5 36.2 - -   HCO3 20.0 - 24.0 mEq/L - 21.6 23.2 - -   TCO2 0 - 100 mmol/L 22 23 24 20 26    ACIDBASEDEF 0.0 - 2.0 mmol/L - 3.0(H) 1.0 - -   O2SAT % - 96.0 97.0 - -      Capillary Blood Glucose: Lab Results  Component Value Date   GLUCAP 122 (H) 09/16/2017   GLUCAP 168 (H) 03/20/2016   GLUCAP 94 03/19/2016    GLUCAP 114 (H) 03/19/2016   GLUCAP 96 03/19/2016     Pulmonary Assessment Scores: Pulmonary Assessment Scores    Row Name 03/20/18 1518         ADL UCSD   ADL Phase  Entry     SOB Score total  45     Rest  0     Walk  9     Stairs  2     Bath  1     Dress  1     Shop  0       CAT Score   CAT Score  16       mMRC Score   mMRC Score  3        Pulmonary Function Assessment: Pulmonary Function Assessment - 03/20/18 1515      Pulmonary Function Tests   FVC%  54 %    FEV1%  54 %    FEV1/FVC Ratio  73    RV%  4.37 %    DLCO%  13.97 %      Initial Spirometry Results   FVC%  57 %    FEV1%  57 %    FEV1/FVC Ratio  73      Post Bronchodilator Spirometry Results   FVC%  57 %    FEV1%  57 %    FEV1/FVC Ratio  73      Breath   Bilateral Breath Sounds  Clear    Shortness of Breath  Yes;Limiting activity       Exercise Target Goals: Exercise Program Goal: Individual exercise prescription set using results from initial 6 min walk test and THRR while considering  patient's activity barriers and safety.   Exercise Prescription Goal: Initial exercise prescription builds to 30-45 minutes a day of aerobic activity, 2-3 days per week.  Home exercise guidelines will be given to patient during program as part of exercise prescription that the participant will acknowledge.  Activity Barriers & Risk Stratification: Activity Barriers & Cardiac Risk Stratification - 03/20/18 1514      Activity Barriers & Cardiac Risk Stratification   Cardiac Risk Stratification  High       6 Minute Walk: 6 Minute Walk    Row Name 03/20/18 1353         6 Minute Walk   Phase  Initial     Distance  800 feet     Walk Time  5.41 minutes     #  of Rest Breaks  1     MPH  1.51     METS  2.16     RPE  13     Perceived Dyspnea   13     VO2 Peak  6.65     Symptoms  No     Resting HR  83 bpm     Resting BP  108/60     Resting Oxygen Saturation   94 %     Exercise Oxygen Saturation   during 6 min walk  89 %     Max Ex. HR  115 bpm     Max Ex. BP  144/72     2 Minute Post BP  122/70        Oxygen Initial Assessment: Oxygen Initial Assessment - 03/20/18 1514      Home Oxygen   Home Oxygen Device  None       Oxygen Re-Evaluation: Oxygen Re-Evaluation    Row Name 04/27/18 1525 05/18/18 1510 06/15/18 1250         Program Oxygen Prescription   Program Oxygen Prescription  None  None  None       Home Oxygen   Home Oxygen Device  Liquid Oxygen  None  None     Sleep Oxygen Prescription  None  None  None     Home Exercise Oxygen Prescription  None  None  None     Home at Rest Exercise Oxygen Prescription  None  None  None       Goals/Expected Outcomes   Short Term Goals  To learn and understand importance of monitoring SPO2 with pulse oximeter and demonstrate accurate use of the pulse oximeter.;To learn and understand importance of maintaining oxygen saturations>88%;To learn and demonstrate proper pursed lip breathing techniques or other breathing techniques.  To learn and understand importance of monitoring SPO2 with pulse oximeter and demonstrate accurate use of the pulse oximeter.;To learn and understand importance of maintaining oxygen saturations>88%;To learn and demonstrate proper pursed lip breathing techniques or other breathing techniques.  To learn and understand importance of monitoring SPO2 with pulse oximeter and demonstrate accurate use of the pulse oximeter.;To learn and understand importance of maintaining oxygen saturations>88%;To learn and demonstrate proper pursed lip breathing techniques or other breathing techniques.     Long  Term Goals  Verbalizes importance of monitoring SPO2 with pulse oximeter and return demonstration;Maintenance of O2 saturations>88%;Exhibits proper breathing techniques, such as pursed lip breathing or other method taught during program session  Verbalizes importance of monitoring SPO2 with pulse oximeter and return  demonstration;Maintenance of O2 saturations>88%;Exhibits proper breathing techniques, such as pursed lip breathing or other method taught during program session  Verbalizes importance of monitoring SPO2 with pulse oximeter and return demonstration;Maintenance of O2 saturations>88%;Exhibits proper breathing techniques, such as pursed lip breathing or other method taught during program session     Comments  Patient is able to verbalize importance of monitoring his SPO2 with pulse oximeter and demonstrates proper pursed lip breathing technique during sessions.   Patient is able to verbalize importance of monitoring his SPO2 with pulse oximeter and demonstrates proper pursed lip breathing technique during sessions.   Patient is able to verbalize importance of monitoring his SPO2 with pulse oximeter and demonstrates proper pursed lip breathing technique during sessions.      Goals/Expected Outcomes  Patient will continue to meet his short and long term goals.   Patient will continue to meet his short and long term goals.  Patient will continue to meet his short and long term goals.         Oxygen Discharge (Final Oxygen Re-Evaluation): Oxygen Re-Evaluation - 06/15/18 1250      Program Oxygen Prescription   Program Oxygen Prescription  None      Home Oxygen   Home Oxygen Device  None    Sleep Oxygen Prescription  None    Home Exercise Oxygen Prescription  None    Home at Rest Exercise Oxygen Prescription  None      Goals/Expected Outcomes   Short Term Goals  To learn and understand importance of monitoring SPO2 with pulse oximeter and demonstrate accurate use of the pulse oximeter.;To learn and understand importance of maintaining oxygen saturations>88%;To learn and demonstrate proper pursed lip breathing techniques or other breathing techniques.    Long  Term Goals  Verbalizes importance of monitoring SPO2 with pulse oximeter and return demonstration;Maintenance of O2 saturations>88%;Exhibits proper  breathing techniques, such as pursed lip breathing or other method taught during program session    Comments  Patient is able to verbalize importance of monitoring his SPO2 with pulse oximeter and demonstrates proper pursed lip breathing technique during sessions.     Goals/Expected Outcomes  Patient will continue to meet his short and long term goals.        Initial Exercise Prescription: Initial Exercise Prescription - 03/20/18 1400      Date of Initial Exercise RX and Referring Provider   Date  03/20/18    Referring Provider  McQuaid    Expected Discharge Date  06/20/18      NuStep   Level  1    SPM  75    Minutes  17    METs  2.2      Recumbant Elliptical   Level  1    RPM  44    Watts  50    Minutes  17    METs  2.9      Prescription Details   Frequency (times per week)  2    Duration  Progress to 30 minutes of continuous aerobic without signs/symptoms of physical distress      Intensity   THRR 40-80% of Max Heartrate  107-120-132    Ratings of Perceived Exertion  11-13    Perceived Dyspnea  0-4      Progression   Progression  Continue progressive overload as per policy without signs/symptoms or physical distress.      Resistance Training   Training Prescription  Yes    Weight  1    Reps  10-15       Perform Capillary Blood Glucose checks as needed.  Exercise Prescription Changes:  Exercise Prescription Changes    Row Name 04/13/18 0700 04/27/18 0800 05/11/18 1000 05/25/18 1500 06/08/18 0700     Response to Exercise   Blood Pressure (Admit)  112/60  112/64  118/64  112/62  112/60   Blood Pressure (Exercise)  136/62  122/64  128/66  116/70  122/60   Blood Pressure (Exit)  110/58  108/60  110/64  108/68  108/60   Heart Rate (Admit)  66 bpm  85 bpm  87 bpm  84 bpm  89 bpm   Heart Rate (Exercise)  97 bpm  94 bpm  110 bpm  87 bpm  88 bpm   Heart Rate (Exit)  88 bpm  82 bpm  97 bpm  79 bpm  81 bpm   Oxygen Saturation (Admit)  92 %  91 %  92 %  91 %  95 %    Oxygen Saturation (Exercise)  90 %  90 %  91 %  90 %  92 %   Oxygen Saturation (Exit)  92 %  90 %  91 %  93 %  92 %   Rating of Perceived Exertion (Exercise)  11  13  12  13  14    Perceived Dyspnea (Exercise)  11  13  12  12  12    Comments  First two weeks of exercise sessions  -  -  returned after being out for 2 weeks   -   Duration  Progress to 30 minutes of  aerobic without signs/symptoms of physical distress  Progress to 30 minutes of  aerobic without signs/symptoms of physical distress  Continue with 30 min of aerobic exercise without signs/symptoms of physical distress.  Continue with 30 min of aerobic exercise without signs/symptoms of physical distress.  Continue with 30 min of aerobic exercise without signs/symptoms of physical distress.   Intensity  THRR New 107-120-132  THRR unchanged  THRR unchanged  THRR unchanged  THRR unchanged     Progression   Progression  Continue to progress workloads to maintain intensity without signs/symptoms of physical distress.  Continue to progress workloads to maintain intensity without signs/symptoms of physical distress.  Continue to progress workloads to maintain intensity without signs/symptoms of physical distress.  Continue to progress workloads to maintain intensity without signs/symptoms of physical distress.  Continue to progress workloads to maintain intensity without signs/symptoms of physical distress.   Average METs  2.05  2.65  2.5  2.3  2.1     Resistance Training   Training Prescription  Yes  Yes  Yes  Yes  Yes   Weight  2  3  3  3  3    Reps  10-15  10-15  10-15  10-15  10-15     NuStep   Level  1  2  2   - was told by Dr. not to use his legs  -   SPM  70  75  71  -  -   Minutes  22  22  22   -  -   METs  1.9  2.2  1.8  -  -     Arm Ergometer   Level  -  -  -  2  2   Watts  -  -  -  23  19   Minutes  -  -  -  17  17   METs  -  -  -  2.7  2.4     Arm/Foot Ergometer   Level  -  -  -  2  2.5   Watts  -  -  -  12  12   Minutes  -   -  -  22  22   METs  -  -  -  1.9  1.8     Recumbant Elliptical   Level  1  1  2   - was told by Dr. not to use his legs.   -   RPM  37  41  45  -  -   Watts  32  54  59  -  -   Minutes  17  17  17   -  -   METs  2.2  3.1  3.2  -  -     Home Exercise Plan  Plans to continue exercise at  -  -  Home (comment)  Home (comment)  Home (comment)   Frequency  -  -  Add 2 additional days to program exercise sessions.  Add 2 additional days to program exercise sessions.  Add 2 additional days to program exercise sessions.   Initial Home Exercises Provided  -  -  03/20/18  03/20/18  03/20/18      Exercise Comments:  Exercise Comments    Row Name 04/04/18 1023 04/27/18 0816 05/17/18 1459 06/13/18 0956     Exercise Comments  Patient recently began program. He has finished one complete exercise session. We will monitor and progress as tolerated.   Pt. has been doing well in the program, he has attended 7 sessions. He has tolerated the exercise well. We will continue to monitor progress.   Pt. has attended 10 sessions so far. He continues to tolerated the exercise well and feels as if he is getting stronger and beginning to breath better at home.   Pt. has attended 15 sessions so far. He is still tolerating the exercise well but is only able to use his arms at this time. We will continue to monitor and progress as we can.        Exercise Goals and Review:  Exercise Goals    Row Name 03/20/18 1355             Exercise Goals   Increase Physical Activity  Yes       Intervention  Provide advice, education, support and counseling about physical activity/exercise needs.;Develop an individualized exercise prescription for aerobic and resistive training based on initial evaluation findings, risk stratification, comorbidities and participant's personal goals.       Expected Outcomes  Short Term: Attend rehab on a regular basis to increase amount of physical activity.       Increase Strength and Stamina   Yes       Intervention  Provide advice, education, support and counseling about physical activity/exercise needs.;Develop an individualized exercise prescription for aerobic and resistive training based on initial evaluation findings, risk stratification, comorbidities and participant's personal goals.       Expected Outcomes  Short Term: Increase workloads from initial exercise prescription for resistance, speed, and METs.       Able to understand and use rate of perceived exertion (RPE) scale  Yes       Intervention  Provide education and explanation on how to use RPE scale       Expected Outcomes  Short Term: Able to use RPE daily in rehab to express subjective intensity level;Long Term:  Able to use RPE to guide intensity level when exercising independently       Able to understand and use Dyspnea scale  Yes       Intervention  Provide education and explanation on how to use Dyspnea scale       Expected Outcomes  Short Term: Able to use Dyspnea scale daily in rehab to express subjective sense of shortness of breath during exertion;Long Term: Able to use Dyspnea scale to guide intensity level when exercising independently       Knowledge and understanding of Target Heart Rate Range (THRR)  Yes       Intervention  Provide education and explanation of THRR including how the numbers were predicted and where they are located for reference       Expected Outcomes  Short Term: Able to state/look up THRR;Long Term: Able to  use THRR to govern intensity when exercising independently;Short Term: Able to use daily as guideline for intensity in rehab       Able to check pulse independently  Yes       Intervention  Provide education and demonstration on how to check pulse in carotid and radial arteries.;Review the importance of being able to check your own pulse for safety during independent exercise       Expected Outcomes  Short Term: Able to explain why pulse checking is important during independent  exercise;Long Term: Able to check pulse independently and accurately       Understanding of Exercise Prescription  Yes       Intervention  Provide education, explanation, and written materials on patient's individual exercise prescription       Expected Outcomes  Short Term: Able to explain program exercise prescription;Long Term: Able to explain home exercise prescription to exercise independently          Exercise Goals Re-Evaluation : Exercise Goals Re-Evaluation    Row Name 04/04/18 1022 04/27/18 0814 05/17/18 1457 06/13/18 0954       Exercise Goal Re-Evaluation   Exercise Goals Review  Increase Strength and Stamina;Increase Physical Activity;Able to understand and use rate of perceived exertion (RPE) scale;Knowledge and understanding of Target Heart Rate Range (THRR);Understanding of Exercise Prescription;Able to check pulse independently;Able to understand and use Dyspnea scale  Increase Strength and Stamina;Increase Physical Activity;Able to understand and use rate of perceived exertion (RPE) scale;Knowledge and understanding of Target Heart Rate Range (THRR);Understanding of Exercise Prescription;Able to check pulse independently;Able to understand and use Dyspnea scale  Increase Strength and Stamina;Increase Physical Activity;Able to understand and use rate of perceived exertion (RPE) scale;Knowledge and understanding of Target Heart Rate Range (THRR);Understanding of Exercise Prescription;Able to check pulse independently;Able to understand and use Dyspnea scale  Increase Strength and Stamina;Increase Physical Activity;Able to understand and use rate of perceived exertion (RPE) scale;Knowledge and understanding of Target Heart Rate Range (THRR);Understanding of Exercise Prescription;Able to check pulse independently;Able to understand and use Dyspnea scale    Comments  Patient has just recently began the program, he has had one full exercise visit. We will continue to monitor and progress as  tolerated.   Pt. has been doing well in the program so far. He has tolerated the exercise well and has stated his breathing is already improving with ADLs.   Pt. continues to do well in PR. He has worked up to level 2 on both the NuStep and Recumbant Elliptical.   Pt. is on sessoin 16 of PR. He is doing well but has experienced some set back. He is only able to use his arms so we have switched both of his machines to the Arm Ergometer.     Expected Outcomes  Increase activity level.   Increase activity level.   Increase activity level.   Increase activity level at home.        Discharge Exercise Prescription (Final Exercise Prescription Changes): Exercise Prescription Changes - 06/08/18 0700      Response to Exercise   Blood Pressure (Admit)  112/60    Blood Pressure (Exercise)  122/60    Blood Pressure (Exit)  108/60    Heart Rate (Admit)  89 bpm    Heart Rate (Exercise)  88 bpm    Heart Rate (Exit)  81 bpm    Oxygen Saturation (Admit)  95 %    Oxygen Saturation (Exercise)  92 %    Oxygen Saturation (  Exit)  92 %    Rating of Perceived Exertion (Exercise)  14    Perceived Dyspnea (Exercise)  12    Duration  Continue with 30 min of aerobic exercise without signs/symptoms of physical distress.    Intensity  THRR unchanged      Progression   Progression  Continue to progress workloads to maintain intensity without signs/symptoms of physical distress.    Average METs  2.1      Resistance Training   Training Prescription  Yes    Weight  3    Reps  10-15      Arm Ergometer   Level  2    Watts  19    Minutes  17    METs  2.4      Arm/Foot Ergometer   Level  2.5    Watts  12    Minutes  22    METs  1.8      Home Exercise Plan   Plans to continue exercise at  Home (comment)    Frequency  Add 2 additional days to program exercise sessions.    Initial Home Exercises Provided  03/20/18       Nutrition:  Target Goals: Understanding of nutrition guidelines, daily intake of  sodium 1500mg , cholesterol 200mg , calories 30% from fat and 7% or less from saturated fats, daily to have 5 or more servings of fruits and vegetables.  Biometrics: Pre Biometrics - 03/20/18 1355      Pre Biometrics   Height  5\' 11"  (1.803 m)    Waist Circumference  44 inches    Hip Circumference  43.5 inches    Waist to Hip Ratio  1.01 %    Triceps Skinfold  10 mm    % Body Fat  28.6 %    Grip Strength  18.7 kg    Single Leg Stand  1.5 seconds        Nutrition Therapy Plan and Nutrition Goals: Nutrition Therapy & Goals - 06/15/18 1239      Nutrition Therapy   RD appointment deferred  Yes      Personal Nutrition Goals   Comments  Patient did not attend the RD appointment in February. He states he is lowering his carbs and has cut out sweets. He feels he is meeting his nutrition goals. Will continue to monitor for progress.       Intervention Plan   Intervention  Nutrition handout(s) given to patient.       Nutrition Assessments: Nutrition Assessments - 03/20/18 1459      MEDFICTS Scores   Pre Score  67       Nutrition Goals Re-Evaluation:   Nutrition Goals Discharge (Final Nutrition Goals Re-Evaluation):   Psychosocial: Target Goals: Acknowledge presence or absence of significant depression and/or stress, maximize coping skills, provide positive support system. Participant is able to verbalize types and ability to use techniques and skills needed for reducing stress and depression.  Initial Review & Psychosocial Screening: Initial Psych Review & Screening - 03/20/18 1456      Initial Review   Current issues with  None Identified      Family Dynamics   Good Support System?  Yes    Concerns  Recent loss of child   Patient seems to be coping with this tragedy.     Barriers   Psychosocial barriers to participate in program  There are no identifiable barriers or psychosocial needs.      Screening Interventions  Interventions  Encouraged to exercise     Expected Outcomes  Short Term goal: Identification and review with participant of any Quality of Life or Depression concerns found by scoring the questionnaire.;Long Term goal: The participant improves quality of Life and PHQ9 Scores as seen by post scores and/or verbalization of changes       Quality of Life Scores: Quality of Life - 03/20/18 1356      Quality of Life   Select  Quality of Life      Quality of Life Scores   Health/Function Pre  23.96 %    Socioeconomic Pre  25.5 %    Psych/Spiritual Pre  25.6 %    Family Pre  30 %    GLOBAL Pre  25.23 %      Scores of 19 and below usually indicate a poorer quality of life in these areas.  A difference of  2-3 points is a clinically meaningful difference.  A difference of 2-3 points in the total score of the Quality of Life Index has been associated with significant improvement in overall quality of life, self-image, physical symptoms, and general health in studies assessing change in quality of life.   PHQ-9: Recent Review Flowsheet Data    Depression screen [facility] [facility] 03/20/2018 11/26/2015   Decreased Interest 0 0   Down, Depressed, Hopeless 0 0   PHQ - 2 Score 0 0   Altered sleeping 0 0   Tired, decreased energy 0 1   Change in appetite 0 1   Feeling bad or failure about yourself  1 -   Trouble concentrating 0 0   Moving slowly or fidgety/restless 0 0   Suicidal thoughts 0 0   PHQ-9 Score 1 2   Difficult doing work/chores Not difficult at all Not difficult at all     Interpretation of Total Score  Total Score Depression Severity:  1-4 = Minimal depression, 5-9 = Mild depression, 10-14 = Moderate depression, 15-19 = Moderately severe depression, 20-27 = Severe depression   Psychosocial Evaluation and Intervention: Psychosocial Evaluation - 03/20/18 1458      Psychosocial Evaluation & Interventions   Interventions  Encouraged to exercise with the program and follow exercise prescription    Continue Psychosocial Services    No Follow up required       Psychosocial Re-Evaluation: Psychosocial Re-Evaluation    [facility] [place] Name 04/27/18 1530 05/18/18 1509 06/15/18 1243         Psychosocial Re-Evaluation   Current issues with  None Identified  None Identified  None Identified     Comments  Patient's QOL score was 25.43 and PHQ-9 score was 2. He has no psychosocial issues identified.   Patient's QOL score was 25.43 and PHQ-9 score was 2. He has no psychosocial issues identified.   Patient's QOL score was 25.43 and PHQ-9 score was 2. He has no psychosocial issues identified.      Expected Outcomes  Patient will have no psychosocial issues identified at discharge.   Patient will have no psychosocial issues identified at discharge.   Patient will have no psychosocial issues identified at discharge.      Interventions  Stress management education;Relaxation education;Encouraged to attend Pulmonary Rehabilitation for the exercise  Stress management education;Relaxation education;Encouraged to attend Pulmonary Rehabilitation for the exercise  Stress management education;Relaxation education;Encouraged to attend Pulmonary Rehabilitation for the exercise     Continue Psychosocial Services   No Follow up required  No Follow up required  No Follow up  required        Psychosocial Discharge (Final Psychosocial Re-Evaluation): Psychosocial Re-Evaluation - 06/15/18 1243      Psychosocial Re-Evaluation   Current issues with  None Identified    Comments  Patient's QOL score was 25.43 and PHQ-9 score was 2. He has no psychosocial issues identified.     Expected Outcomes  Patient will have no psychosocial issues identified at discharge.     Interventions  Stress management education;Relaxation education;Encouraged to attend Pulmonary Rehabilitation for the exercise    Continue Psychosocial Services   No Follow up required        Education: Education Goals: Education classes will be provided on a weekly basis, covering required  topics. Participant will state understanding/return demonstration of topics presented.  Learning Barriers/Preferences: Learning Barriers/Preferences - 03/20/18 1447      Learning Barriers/Preferences   Learning Barriers  None    Learning Preferences  Individual Instruction;Group Instruction;Skilled Demonstration       Education Topics: How Lungs Work and Diseases: - Discuss the anatomy of the lungs and diseases that can affect the lungs, such as COPD.   Exercise: -Discuss the importance of exercise, FITT principles of exercise, normal and abnormal responses to exercise, and how to exercise safely.   Environmental Irritants: -Discuss types of environmental irritants and how to limit exposure to environmental irritants.   Meds/Inhalers and oxygen: - Discuss respiratory medications, definition of an inhaler and oxygen, and the proper way to use an inhaler and oxygen.   Energy Saving Techniques: - Discuss methods to conserve energy and decrease shortness of breath when performing activities of daily living.    Bronchial Hygiene / Breathing Techniques: - Discuss breathing mechanics, pursed-lip breathing technique,  proper posture, effective ways to clear airways, and other functional breathing techniques   PULMONARY REHAB OTHER RESPIRATORY from 06/08/2018 in [place]  Date  04/13/18  Educator  Wynetta Emery  Instruction Review Code  2- Demonstrated Understanding      Cleaning Equipment: - Provides group verbal and written instruction about the health risks of elevated stress, cause of high stress, and healthy ways to reduce stress.   Nutrition I: Fats: - Discuss the types of cholesterol, what cholesterol does to the body, and how cholesterol levels can be controlled.   PULMONARY REHAB OTHER RESPIRATORY from 06/08/2018 in [place]  Date  04/27/18  Educator  Wynetta Emery  Instruction Review Code  2- Demonstrated Understanding       Nutrition II: Labels: -Discuss the different components of food labels and how to read food labels.   PULMONARY REHAB OTHER RESPIRATORY from 06/08/2018 in [place]  Date  05/04/18  Educator  D. Coad  Instruction Review Code  2- Demonstrated Understanding      Respiratory Infections: - Discuss the signs and symptoms of respiratory infections, ways to prevent respiratory infections, and the importance of seeking medical treatment when having a respiratory infection.   Stress I: Signs and Symptoms: - Discuss the causes of stress, how stress may lead to anxiety and depression, and ways to limit stress.   PULMONARY REHAB OTHER RESPIRATORY from 06/08/2018 in [place]  Date  05/25/18  Educator  MB  Instruction Review Code  2- Demonstrated Understanding      Stress II: Relaxation: -Discuss relaxation techniques to limit stress.   PULMONARY REHAB OTHER RESPIRATORY from 06/08/2018 in [place]  Date  06/01/18  Educator  Wynetta Emery  Instruction Review Code  2-  Demonstrated Understanding      Oxygen for Home/Travel: - Discuss how to prepare for travel when on oxygen and proper ways to transport and store oxygen to ensure safety.   PULMONARY REHAB OTHER RESPIRATORY from 06/08/2018 in [place]  Date  06/08/18  Educator  DJ  Instruction Review Code  2- Demonstrated Understanding      Knowledge Questionnaire Score: Knowledge Questionnaire Score - 03/20/18 1448      Knowledge Questionnaire Score   Pre Score  15/17       Core Components/Risk Factors/Patient Goals at Admission: Personal Goals and Risk Factors at Admission - 03/20/18 1459      Core Components/Risk Factors/Patient Goals on Admission    Weight Management  Weight Maintenance    Personal Goal Other  Yes    Personal Goal  Increase his activity    Intervention  Come to [facility] 3 days/week and supplement with 2 more days at home.      Expected Outcomes  To reach personal goals.        Core Components/Risk Factors/Patient Goals Review:  Goals and Risk Factor Review    Row Name 04/27/18 1528 05/18/18 1507 06/15/18 1241         Core Components/Risk Factors/Patient Goals Review   Personal Goals Review  Weight Management/Obesity;Improve shortness of breath with ADL's Gain strength; increase activities.   Weight Management/Obesity;Improve shortness of breath with ADL's Gain strength; increase activities.   Weight Management/Obesity;Improve shortness of breath with ADL's Gain strength; increase activities.      Review  Patient has completed 8 sessions gaining 8 lbs since he started the program. He admitts to needing to work on his diet and blames it on the holiday season. He is doing well in the program with progression. He says he is feeling somewhat stronger and hopes to continue to improve as the continues to attend. Will continue to monitor for progress.   Patient has completed 10 session losing 4 lbs since his last 30 day review. He is doing well in the program with progression but has been out for the past week with URI. He says this has helped him get moving and start an exercise program he hopes to continue. Will continue to monitor for progress.   Patient has completed 16 sessions gaining 1 lb since last 30 day review. He continues to do well in the program with progression. He states he is breathing better and is getting stronger. Will continue to monitor for progress.      Expected Outcomes  Patient will continue to attend sessions and complete the program meeting his personal goals.   Patient will continue to attend sessions and complete the program meeting his personal goals.   Patient will continue to attend sessions and complete the program meeting his personal goals.         Core Components/Risk Factors/Patient Goals at Discharge (Final Review):  Goals and Risk Factor Review - 06/15/18 1241      Core  Components/Risk Factors/Patient Goals Review   Personal Goals Review  Weight Management/Obesity;Improve shortness of breath with ADL's   Gain strength; increase activities.    Review  Patient has completed 16 sessions gaining 1 lb since last 30 day review. He continues to do well in the program with progression. He states he is breathing better and is getting stronger. Will continue to monitor for progress.     Expected Outcomes  Patient will continue to attend sessions and complete the program meeting  his personal goals.        ITP Comments: ITP Comments    Row Name 03/20/18 1512 04/10/18 1415         ITP Comments  Patient has been in our program before with his heart. He is returning with emphysema. He is now retired and plans to complete the program this time.   Patient is new to program. He has completed 3 sessions. Will continue to monitor for progress.          Comments: ITP REVIEW Pt is making expected progress toward pulmonary rehab goals after completing 16 sessions. Recommend continued exercise, life style modification, education, and utilization of breathing techniques to increase stamina and strength and decrease shortness of breath with exertion.

## 2018-06-19 DIAGNOSIS — I10 Essential (primary) hypertension: Secondary | ICD-10-CM | POA: Diagnosis not present

## 2018-06-19 DIAGNOSIS — I4892 Unspecified atrial flutter: Secondary | ICD-10-CM | POA: Diagnosis not present

## 2018-06-19 DIAGNOSIS — I509 Heart failure, unspecified: Secondary | ICD-10-CM | POA: Diagnosis not present

## 2018-06-19 DIAGNOSIS — E1165 Type 2 diabetes mellitus with hyperglycemia: Secondary | ICD-10-CM | POA: Diagnosis not present

## 2018-06-19 DIAGNOSIS — Z299 Encounter for prophylactic measures, unspecified: Secondary | ICD-10-CM | POA: Diagnosis not present

## 2018-06-19 DIAGNOSIS — Z789 Other specified health status: Secondary | ICD-10-CM | POA: Diagnosis not present

## 2018-06-19 DIAGNOSIS — Z6831 Body mass index (BMI) 31.0-31.9, adult: Secondary | ICD-10-CM | POA: Diagnosis not present

## 2018-06-19 DIAGNOSIS — E1151 Type 2 diabetes mellitus with diabetic peripheral angiopathy without gangrene: Secondary | ICD-10-CM | POA: Diagnosis not present

## 2018-06-20 ENCOUNTER — Encounter (HOSPITAL_COMMUNITY)
Admission: RE | Admit: 2018-06-20 | Discharge: 2018-06-20 | Disposition: A | Discharge status: Home / Self Care | Payer: Medicare Other | Source: Ambulatory Visit | Attending: Pulmonary Disease | Admitting: Pulmonary Disease

## 2018-06-20 ENCOUNTER — Telehealth: Payer: Self-pay

## 2018-06-20 ENCOUNTER — Ambulatory Visit (INDEPENDENT_AMBULATORY_CARE_PROVIDER_SITE_OTHER): Payer: Medicare Other | Admitting: Pharmacist

## 2018-06-20 ENCOUNTER — Telehealth: Payer: Self-pay | Admitting: Cardiovascular Disease

## 2018-06-20 DIAGNOSIS — I4892 Unspecified atrial flutter: Secondary | ICD-10-CM | POA: Diagnosis not present

## 2018-06-20 DIAGNOSIS — Z9889 Other specified postprocedural states: Secondary | ICD-10-CM

## 2018-06-20 DIAGNOSIS — Z5181 Encounter for therapeutic drug level monitoring: Secondary | ICD-10-CM | POA: Diagnosis not present

## 2018-06-20 DIAGNOSIS — I4891 Unspecified atrial fibrillation: Secondary | ICD-10-CM

## 2018-06-20 DIAGNOSIS — J432 Centrilobular emphysema: Secondary | ICD-10-CM

## 2018-06-20 DIAGNOSIS — I359 Nonrheumatic aortic valve disorder, unspecified: Secondary | ICD-10-CM

## 2018-06-20 DIAGNOSIS — I350 Nonrheumatic aortic (valve) stenosis: Secondary | ICD-10-CM

## 2018-06-20 LAB — POCT INR: INR: 2.2 (ref 2.0–3.0)

## 2018-06-20 NOTE — Telephone Encounter (Signed)
Patient called about his SOB  Stated that it is not getting any better

## 2018-06-20 NOTE — Telephone Encounter (Signed)
Pt is experiencing some shortness of breathe, and he does not have any energy. I told the patient the nurse will give him a call back when she receives the transmission and take a look at it. He can be reached at [phone]. Pt wants a call back even if we do not receive his transmission.

## 2018-06-20 NOTE — Progress Notes (Signed)
Daily Session Note  Patient Details  Name: Christian Rasmussen MRN: [id number] Date of Birth: 01/11/1941 Referring Provider:     PULMONARY REHAB OTHER RESP ORIENTATION from 03/20/2018 in [facility]  Referring Provider  [place]      Encounter Date: 06/20/2018  Check In: Session Check In - 06/20/18 1045      Check-In   Supervising physician immediately available to respond to emergencies  See telemetry face sheet for immediately available MD    Location  [facility]    Staff Present  Amanda Ballard, Exercise Physiologist;Diane Coad, MS, EP, CHC, Exercise Physiologist    Medication changes reported      No    Fall or balance concerns reported     No    Warm-up and Cool-down  Performed as group-led instruction    Resistance Training Performed  Yes    VAD Patient?  No    PAD/SET Patient?  No      Pain Assessment   Currently in Pain?  No/denies    Pain Score  0-No pain    Multiple Pain Sites  No       Capillary Blood Glucose: Results for orders placed or performed in visit on 06/20/18 (from the past 24 hour(s))  POCT INR     Status: None   Collection Time: 06/20/18  7:48 AM  Result Value Ref Range   INR 2.2 2.0 - 3.0      Social History   Tobacco Use  Smoking Status Former Smoker  . Packs/day: 1.00  . Years: 28.00  . Pack years: 28.00  . Types: Cigarettes  . Start date: 10/21/1956  . Last attempt to quit: 05/10/1985  . Years since quitting: 33.1  Smokeless Tobacco Never Used    Goals Met:  Proper associated with RPD/PD & O2 Sat Independence with exercise equipment Using PLB without cueing & demonstrates good technique Exercise tolerated well No report of cardiac concerns or symptoms Strength training completed today  Goals Unmet:  Not Applicable  Comments: Pt able to follow exercise prescription today without complaint.  Will continue to monitor for progression. Check out 1145.   Dr. Edward Hawkins is Medical Director for  [facility]. 

## 2018-06-20 NOTE — Patient Instructions (Signed)
Description   Continue 1 tablet daily except 1 1/2 tablets on Tuesdays, Thursdays and Saturdays Recheck in 5 weeks

## 2018-06-21 ENCOUNTER — Telehealth: Payer: Self-pay | Admitting: Cardiovascular Disease

## 2018-06-21 ENCOUNTER — Ambulatory Visit: Payer: Medicare Other | Admitting: Student

## 2018-06-21 MED ORDER — CARVEDILOL 12.5 MG PO TABS: 18.7500 mg | ORAL_TABLET | Freq: Two times a day (BID) | ORAL | 3 refills | Status: DC

## 2018-06-21 MED ORDER — LISINOPRIL 5 MG PO TABS: 5.0000 mg | ORAL_TABLET | Freq: Every day | ORAL | 3 refills | Status: DC

## 2018-06-21 NOTE — Telephone Encounter (Signed)
Kem, CMA, assisted patient with sending a manual transmission. Transmission received and reviewed. Notes also in 06/20/18 device phone note.   Presenting rhythm shows As/Vp @ 83bpm with quadrigeminal PVCs. Atrial HR histograms are appropriate (Ap 1%). Ventricular histograms suggest increased PVC burden (Vs events are solid purple) since last in-office check on 01/31/18, compared with previous monitoring period from 01/27/17-01/31/18. Vp decreased to 84% from 94%. No significant atrial or ventricular high rate episodes recorded.    Routed to Dr. Bronson Ing and Dr. Lovena Le for review.

## 2018-06-21 NOTE — Telephone Encounter (Signed)
Pre-cert Verification for the following procedure    ECHO scheduled for 06-22-2018 at the [facility] aortic valve stenosis [I35.0] Aortic valve disease [I35.9] S/P mitral valve repair

## 2018-06-21 NOTE — Addendum Note (Signed)
Addended by: Merlene Laughter on: 06/21/2018 04:27 PM   Modules accepted: Orders

## 2018-06-21 NOTE — Telephone Encounter (Signed)
Kem, CMA, assisted patient with sending a manual transmission. Transmission received and reviewed. Notes also added to 06/20/18 [facility] triage phone note. Presenting rhythm shows As/Vp @ 83bpm with quadrigeminal PVCs. Atrial HR histograms are appropriate (Ap 1%). Ventricular histograms suggest increased PVC burden (Vs events are solid purple) since last in-office check on 01/31/18, compared with previous monitoring period from 01/27/17-01/31/18. Vp decreased to 84% from 94%. No significant atrial or ventricular high rate episodes recorded.  Patient made aware that I will send this information to Dr. Bronson Ing and Dr. Lovena Le.

## 2018-06-21 NOTE — Telephone Encounter (Addendum)
Patient c/o sob continues and hasn't improved but is not any worse. Patient says he is more sob with activity. Denies dizziness or chest pain. Currently doing pulmonary rehab at [facility]. Offered appointment to be seen today and patient declined stating that he doesn't feel he need to be seen today and will wait for his upcoming appointment with Dr. Raliegh Ip on 07/10/2018. Patient says he wanted to make Dr. Raliegh Ip aware of the ongoing sob and to see if there is anything else that can be done. Advised patient is symptoms get worse, to go to the ED for an evaluation. Verbalized understanding.

## 2018-06-21 NOTE — Telephone Encounter (Signed)
Reduce lisinopril to 5 mg daily and increase Coreg to 12.5 mg twice daily.  Obtain echocardiogram for reevaluation of aortic stenosis and mitral stenosis.

## 2018-06-21 NOTE — Telephone Encounter (Signed)
Patient informed and verbalized understanding of plan. 

## 2018-06-22 ENCOUNTER — Ambulatory Visit (INDEPENDENT_AMBULATORY_CARE_PROVIDER_SITE_OTHER): Payer: Medicare Other

## 2018-06-22 ENCOUNTER — Encounter (HOSPITAL_COMMUNITY): Payer: Medicare Other

## 2018-06-22 DIAGNOSIS — I350 Nonrheumatic aortic (valve) stenosis: Secondary | ICD-10-CM

## 2018-06-22 DIAGNOSIS — I359 Nonrheumatic aortic valve disorder, unspecified: Secondary | ICD-10-CM | POA: Diagnosis not present

## 2018-06-22 DIAGNOSIS — Z9889 Other specified postprocedural states: Secondary | ICD-10-CM

## 2018-06-23 ENCOUNTER — Telehealth: Payer: Self-pay | Admitting: Cardiovascular Disease

## 2018-06-23 ENCOUNTER — Encounter: Payer: Self-pay | Admitting: *Deleted

## 2018-06-23 ENCOUNTER — Telehealth: Payer: Self-pay | Admitting: *Deleted

## 2018-06-23 ENCOUNTER — Other Ambulatory Visit: Payer: Self-pay | Admitting: Cardiovascular Disease

## 2018-06-23 DIAGNOSIS — I350 Nonrheumatic aortic (valve) stenosis: Secondary | ICD-10-CM

## 2018-06-23 DIAGNOSIS — I059 Rheumatic mitral valve disease, unspecified: Secondary | ICD-10-CM

## 2018-06-23 DIAGNOSIS — Z01812 Encounter for preprocedural laboratory examination: Secondary | ICD-10-CM

## 2018-06-23 NOTE — Telephone Encounter (Signed)
Pre-cert Verification for the following procedure    Tee   Scheduled for Monday, 06/26/18 at 11:00 am at [facility].

## 2018-06-23 NOTE — Telephone Encounter (Signed)
TEE scheduled for Monday, 06/26/18 at 11:00 with Dr. Margaretann Loveless- asked patient to arrive a short stay at [facility] at 9:00 am so he can get pre-labs done.  Patient is not at home & unable to get any labs done today.  Instructions given.

## 2018-06-26 ENCOUNTER — Ambulatory Visit (HOSPITAL_COMMUNITY)
Admission: RE | Admit: 2018-06-26 | Discharge: 2018-06-26 | Disposition: A | Discharge status: Home / Self Care | Payer: Medicare Other | Attending: Internal Medicine | Admitting: Internal Medicine

## 2018-06-26 ENCOUNTER — Ambulatory Visit: Payer: Medicare Other | Admitting: Physician Assistant

## 2018-06-26 ENCOUNTER — Other Ambulatory Visit: Payer: Self-pay

## 2018-06-26 ENCOUNTER — Encounter (HOSPITAL_COMMUNITY)
Admission: RE | Disposition: A | Discharge status: Home / Self Care | Payer: Self-pay | Source: Home / Self Care | Attending: Internal Medicine

## 2018-06-26 ENCOUNTER — Ambulatory Visit (HOSPITAL_COMMUNITY): Payer: Medicare Other | Admitting: Registered Nurse

## 2018-06-26 ENCOUNTER — Ambulatory Visit (HOSPITAL_BASED_OUTPATIENT_CLINIC_OR_DEPARTMENT_OTHER): Payer: Medicare Other

## 2018-06-26 ENCOUNTER — Encounter (HOSPITAL_COMMUNITY): Payer: Self-pay

## 2018-06-26 DIAGNOSIS — I350 Nonrheumatic aortic (valve) stenosis: Secondary | ICD-10-CM

## 2018-06-26 DIAGNOSIS — I371 Nonrheumatic pulmonary valve insufficiency: Secondary | ICD-10-CM | POA: Diagnosis not present

## 2018-06-26 DIAGNOSIS — I083 Combined rheumatic disorders of mitral, aortic and tricuspid valves: Secondary | ICD-10-CM | POA: Diagnosis not present

## 2018-06-26 DIAGNOSIS — I110 Hypertensive heart disease with heart failure: Secondary | ICD-10-CM | POA: Diagnosis not present

## 2018-06-26 DIAGNOSIS — I5031 Acute diastolic (congestive) heart failure: Secondary | ICD-10-CM | POA: Diagnosis not present

## 2018-06-26 DIAGNOSIS — Z9889 Other specified postprocedural states: Secondary | ICD-10-CM

## 2018-06-26 HISTORY — PX: TEE WITHOUT CARDIOVERSION: SHX5443

## 2018-06-26 LAB — CBC
HCT: 37.5 % — ABNORMAL LOW (ref 39.0–52.0)
Hemoglobin: 12 g/dL — ABNORMAL LOW (ref 13.0–17.0)
MCH: 30.6 pg (ref 26.0–34.0)
MCHC: 32 g/dL (ref 30.0–36.0)
MCV: 95.7 fL (ref 80.0–100.0)
Platelets: 296 10*3/uL (ref 150–400)
RBC: 3.92 MIL/uL — ABNORMAL LOW (ref 4.22–5.81)
RDW: 16.8 % — ABNORMAL HIGH (ref 11.5–15.5)
WBC: 7.8 10*3/uL (ref 4.0–10.5)
nRBC: 0 % (ref 0.0–0.2)

## 2018-06-26 LAB — PROTIME-INR
INR: 2.97
Prothrombin Time: 30.5 seconds — ABNORMAL HIGH (ref 11.4–15.2)

## 2018-06-26 SURGERY — ECHOCARDIOGRAM, TRANSESOPHAGEAL
Anesthesia: Monitor Anesthesia Care

## 2018-06-26 MED ORDER — SODIUM CHLORIDE 0.9 % IV SOLN: INTRAVENOUS | Status: DC

## 2018-06-26 MED ORDER — BUTAMBEN-TETRACAINE-BENZOCAINE 2-2-14 % EX AERO
INHALATION_SPRAY | CUTANEOUS | Status: DC | PRN
  Administered 2018-06-26: 1 via TOPICAL

## 2018-06-26 MED ORDER — LIDOCAINE 2% (20 MG/ML) 5 ML SYRINGE
INTRAMUSCULAR | Status: DC | PRN
  Administered 2018-06-26: 60 mg via INTRAVENOUS

## 2018-06-26 MED ORDER — PROPOFOL 10 MG/ML IV BOLUS
INTRAVENOUS | Status: DC | PRN
  Administered 2018-06-26: 20 mg via INTRAVENOUS

## 2018-06-26 MED ORDER — LACTATED RINGERS IV SOLN
INTRAVENOUS | Status: DC
  Administered 2018-06-26: 10:00:00 via INTRAVENOUS

## 2018-06-26 MED ORDER — PROPOFOL 500 MG/50ML IV EMUL
INTRAVENOUS | Status: DC | PRN
  Administered 2018-06-26: 75 ug/kg/min via INTRAVENOUS

## 2018-06-26 NOTE — Anesthesia Procedure Notes (Signed)
Procedure Name: MAC Date/Time: 06/26/2018 9:35 AM Performed by: Trinna Post., CRNA Pre-anesthesia Checklist: Patient identified, Emergency Drugs available, Suction available, Patient being monitored and Timeout performed Patient Re-evaluated:Patient Re-evaluated prior to induction Oxygen Delivery Method: Nasal cannula Preoxygenation: Pre-oxygenation with 100% oxygen Induction Type: IV induction Placement Confirmation: positive ETCO2

## 2018-06-26 NOTE — Anesthesia Preprocedure Evaluation (Signed)
Anesthesia Evaluation  Patient identified by MRN, date of birth, ID band Patient awake    Reviewed: Allergy & Precautions, H&P , NPO status , Patient's Chart, lab work & pertinent test results  Airway Mallampati: II   Neck ROM: full    Dental   Pulmonary sleep apnea , former smoker,    breath sounds clear to auscultation       Cardiovascular hypertension, + angina + CAD, + Past MI, + Cardiac Stents, + CABG and +CHF  + dysrhythmias Atrial Fibrillation + Valvular Problems/Murmurs AS and AI  Rhythm:regular Rate:Normal  S/p EP ablation S/p mitral valve repair   Neuro/Psych    GI/Hepatic   Endo/Other  diabetes, Type 2  Renal/GU      Musculoskeletal   Abdominal   Peds  Hematology   Anesthesia Other Findings   Reproductive/Obstetrics                             Anesthesia Physical Anesthesia Plan  ASA: IV  Anesthesia Plan: MAC   Post-op Pain Management:    Induction: Intravenous  PONV Risk Score and Plan: 1  Airway Management Planned: Nasal Cannula  Additional Equipment:   Intra-op Plan:   Post-operative Plan:   Informed Consent: I have reviewed the patients History and Physical, chart, labs and discussed the procedure including the risks, benefits and alternatives for the proposed anesthesia with the patient or authorized representative who has indicated his/her understanding and acceptance.       Plan Discussed with: CRNA, Anesthesiologist and Surgeon  Anesthesia Plan Comments:         Anesthesia Quick Evaluation

## 2018-06-26 NOTE — Transfer of Care (Signed)
Immediate Anesthesia Transfer of Care Note  Patient: Christian Rasmussen  Procedure(s) Performed: TRANSESOPHAGEAL ECHOCARDIOGRAM (TEE) (N/A ) BUBBLE STUDY  Patient Location: PACU and Endoscopy Unit  Anesthesia Type:MAC  Level of Consciousness: awake, alert  and oriented  Airway & Oxygen Therapy: Patient Spontanous Breathing and Patient connected to nasal cannula oxygen  Post-op Assessment: Report given to RN and Post -op Vital signs reviewed and stable  Post vital signs: Reviewed and stable  Last Vitals:  Vitals Value Taken Time  BP 176/149 06/26/2018 11:22 AM  Temp    Pulse 73 06/26/2018 11:23 AM  Resp 23 06/26/2018 11:23 AM  SpO2 92 % 06/26/2018 11:23 AM  Vitals shown include unvalidated device data.  Last Pain:  Vitals:   06/26/18 1122  TempSrc: (P) Oral  PainSc: 0-No pain         Complications: No apparent anesthesia complications

## 2018-06-26 NOTE — Progress Notes (Signed)
  Echocardiogram Echocardiogram Transesophageal with 3D color and Dopple has been performed.  Darlina Sicilian M 06/26/2018, 11:33 AM

## 2018-06-26 NOTE — H&P (Signed)
Christian Rasmussen presents for TEE at the request of Dr. Bronson Ing for evaluation of mitral and aortic valve disease. He feels well, no concerns and is ready to proceed.   Gen: NAD CV: distant heart sounds, soft systolic and diastolic murmurs Lungs: clear bilaterally Abd: soft +bs Extrem: warm well perfused no edema  Plan: proceed with TEE. Consent obtained. No contraindications.      [name] has been requested to perform a transesophageal echocardiogram on Christian Rasmussen for AS/MS.  After careful review of history and examination, the risks and benefits of transesophageal echocardiogram have been explained including risks of esophageal damage, perforation (1:10,000 risk), bleeding, pharyngeal hematoma as well as other potential complications associated with conscious sedation including aspiration, arrhythmia, respiratory failure and death. Alternatives to treatment were discussed, questions were answered. Patient is willing to proceed.   [name], NP 06/26/2018 11:19 AM

## 2018-06-26 NOTE — CV Procedure (Signed)
INDICATIONS: aortic stenosis, mitral valve repair with elevated gradient.  PROCEDURE:   Informed consent was obtained prior to the procedure. The risks, benefits and alternatives for the procedure were discussed and the patient comprehended these risks.  Risks include, but are not limited to, cough, sore throat, vomiting, nausea, somnolence, esophageal and stomach trauma or perforation, bleeding, low blood pressure, aspiration, pneumonia, infection, trauma to the teeth and death.    After a procedural time-out, the oropharynx was anesthetized with 20% benzocaine spray.   During this procedure the patient was administered propofol per anesthesia to achieve and maintain moderate sedation.  The patient's heart rate, blood pressure, and oxygen saturationweare monitored continuously during the procedure. The period of conscious sedation was 35 minutes, of which I was present face-to-face 100% of this time.  The transesophageal probe was inserted in the esophagus and stomach without difficulty and multiple views were obtained.  The patient was kept under observation until the patient left the procedure room.  The patient left the procedure room in stable condition.   Agitated microbubble saline contrast was administered.  COMPLICATIONS:    There were no immediate complications.  FINDINGS:   Mild-mod aortic valve stenosis S/p mitral valve repair with moderate mitral stenosis at rest. Mild MR No PFO.  RECOMMENDATIONS:    Can discharge when alert.  Time Spent Directly with the Patient:  60 minutes   [name] 06/26/2018, 11:27 AM

## 2018-06-26 NOTE — Discharge Instructions (Signed)

## 2018-06-27 ENCOUNTER — Telehealth: Payer: Self-pay | Admitting: Cardiovascular Disease

## 2018-06-27 ENCOUNTER — Encounter (HOSPITAL_COMMUNITY): Payer: Medicare Other

## 2018-06-27 NOTE — Telephone Encounter (Signed)
Ok to continue pulmonary rehab. Overall valves are stable, they are just a little bit stiffer than normal but not enough to be of any significance  J Mamye Bolds MD

## 2018-06-27 NOTE — Telephone Encounter (Signed)
Wanted to speak with nurse reference has several questions

## 2018-06-27 NOTE — Anesthesia Postprocedure Evaluation (Signed)
Anesthesia Post Note  Patient: Christian Rasmussen  Procedure(s) Performed: TRANSESOPHAGEAL ECHOCARDIOGRAM (TEE) (N/A ) BUBBLE STUDY     Patient location during evaluation: Endoscopy Anesthesia Type: MAC Level of consciousness: awake and alert Pain management: pain level controlled Vital Signs Assessment: post-procedure vital signs reviewed and stable Respiratory status: spontaneous breathing, nonlabored ventilation, respiratory function stable and patient connected to nasal cannula oxygen Cardiovascular status: blood pressure returned to baseline and stable Postop Assessment: no apparent nausea or vomiting Anesthetic complications: no    Last Vitals:  Vitals:   06/26/18 1130 06/26/18 1140  BP: 113/73 126/84  Pulse: 71 72  Resp: (!) 29 (!) 21  Temp:    SpO2: 96% 98%    Last Pain:  Vitals:   06/26/18 1140  TempSrc:   PainSc: 0-No pain                 [name] S

## 2018-06-27 NOTE — Telephone Encounter (Signed)
Patient informed and verbalized understanding of plan. 

## 2018-06-27 NOTE — Telephone Encounter (Signed)
Patient contacted office saying that a problem was found with one of his heart valve after echo yesterday and wanted to know if he should continue pulmonary rehab. Please advise.

## 2018-06-29 ENCOUNTER — Encounter (HOSPITAL_COMMUNITY)
Admission: RE | Admit: 2018-06-29 | Discharge: 2018-06-29 | Disposition: A | Discharge status: Home / Self Care | Payer: Medicare Other | Source: Ambulatory Visit | Attending: Pulmonary Disease | Admitting: Pulmonary Disease

## 2018-06-29 DIAGNOSIS — J432 Centrilobular emphysema: Secondary | ICD-10-CM

## 2018-06-29 NOTE — Progress Notes (Signed)
Daily Session Note  Patient Details  Name: Christian Rasmussen MRN: [id number] Date of Birth: 20-May-1940 Referring Provider:     PULMONARY REHAB OTHER RESP ORIENTATION from 03/20/2018 in [place]  Referring Provider  McQuaid      Encounter Date: 06/29/2018  Check In: Session Check In - 06/29/18 1045      Check-In   Supervising physician immediately available to respond to emergencies  See telemetry face sheet for immediately available MD    Location  [facility]    Staff Present  Benay Pike, Exercise Physiologist;Landrum Carbonell Wynetta Emery, RN, BSN    Medication changes reported      Yes    Comments  Lisonipril reduced from 20 mg to 5 mg daily.     Fall or balance concerns reported     No    Warm-up and Cool-down  Performed as group-led instruction    Resistance Training Performed  Yes    VAD Patient?  No    PAD/SET Patient?  No      Pain Assessment   Currently in Pain?  No/denies    Pain Score  0-No pain    Multiple Pain Sites  No       Capillary Blood Glucose: No results found for this or any previous visit (from the past 24 hour(s)).    Social History   Tobacco Use  Smoking Status Former Smoker  . Packs/day: 1.00  . Years: 28.00  . Pack years: 28.00  . Types: Cigarettes  . Start date: 10/21/1956  . Last attempt to quit: 05/10/1985  . Years since quitting: 33.1  Smokeless Tobacco Never Used    Goals Met:  Proper associated with RPD/PD & O2 Sat Independence with exercise equipment Improved SOB with ADL's Using PLB without cueing & demonstrates good technique Exercise tolerated well No report of cardiac concerns or symptoms Strength training completed today  Goals Unmet:  Not Applicable  Comments: Pt able to follow exercise prescription today without complaint.  Will continue to monitor for progression. Check out 1145.   Dr. Sinda Du is Medical Director for [facility] Pulmonary Re[facility].

## 2018-07-03 ENCOUNTER — Telehealth: Payer: Self-pay | Admitting: Orthopedic Surgery

## 2018-07-03 NOTE — Telephone Encounter (Signed)
Ok to cancel if he is not having any pain

## 2018-07-03 NOTE — Telephone Encounter (Signed)
Patient called and wanted to find out from Dr. Aline Brochure if it was ok for him to cancel this appointment(2/26). States he is taking the medication Dr. Aline Brochure prescribed and is not having any pain with his sciatica at this time. He doesn't want to reschedule until he hears something from Dr. Aline Brochure.  Please advise

## 2018-07-04 ENCOUNTER — Encounter (HOSPITAL_COMMUNITY)
Admission: RE | Admit: 2018-07-04 | Discharge: 2018-07-04 | Disposition: A | Discharge status: Home / Self Care | Payer: Medicare Other | Source: Ambulatory Visit | Attending: Pulmonary Disease | Admitting: Pulmonary Disease

## 2018-07-04 DIAGNOSIS — J432 Centrilobular emphysema: Secondary | ICD-10-CM | POA: Diagnosis not present

## 2018-07-04 NOTE — Progress Notes (Signed)
Daily Session Note  Patient Details  Name: Christian Rasmussen MRN: [id number] Date of Birth: 12-21-1940 Referring Provider:     PULMONARY REHAB OTHER RESP ORIENTATION from 03/20/2018 in [place]  Referring Provider  McQuaid      Encounter Date: 07/04/2018  Check In: Session Check In - 07/04/18 1045      Check-In   Supervising physician immediately available to respond to emergencies  See telemetry face sheet for immediately available MD    Location  [facility]    Staff Present  Benay Pike, Exercise Physiologist;Diane Coad, MS, EP, [facility], Exercise Physiologist    Medication changes reported      No    Fall or balance concerns reported     No    Warm-up and Cool-down  Performed as group-led instruction    Resistance Training Performed  Yes    VAD Patient?  No    PAD/SET Patient?  No      Pain Assessment   Currently in Pain?  No/denies    Pain Score  0-No pain    Multiple Pain Sites  No       Capillary Blood Glucose: No results found for this or any previous visit (from the past 24 hour(s)).    Social History   Tobacco Use  Smoking Status Former Smoker  . Packs/day: 1.00  . Years: 28.00  . Pack years: 28.00  . Types: Cigarettes  . Start date: 10/21/1956  . Last attempt to quit: 05/10/1985  . Years since quitting: 33.1  Smokeless Tobacco Never Used    Goals Met:  Proper associated with RPD/PD & O2 Sat Independence with exercise equipment Improved SOB with ADL's Exercise tolerated well No report of cardiac concerns or symptoms Strength training completed today  Goals Unmet:  Not Applicable  Comments: Pt able to follow exercise prescription today without complaint.  Will continue to monitor for progression. Check out 1145.   Dr. Sinda Du is Medical Director for [facility] Pulmonary Re[facility].

## 2018-07-05 ENCOUNTER — Ambulatory Visit: Payer: Medicare Other | Admitting: Orthopedic Surgery

## 2018-07-05 ENCOUNTER — Ambulatory Visit (INDEPENDENT_AMBULATORY_CARE_PROVIDER_SITE_OTHER)
Admission: RE | Admit: 2018-07-05 | Discharge: 2018-07-05 | Disposition: A | Discharge status: Home / Self Care | Payer: Medicare Other | Source: Ambulatory Visit | Attending: Pulmonary Disease | Admitting: Pulmonary Disease

## 2018-07-05 ENCOUNTER — Ambulatory Visit (INDEPENDENT_AMBULATORY_CARE_PROVIDER_SITE_OTHER): Payer: Medicare Other | Admitting: Pulmonary Disease

## 2018-07-05 ENCOUNTER — Encounter: Payer: Self-pay | Admitting: Pulmonary Disease

## 2018-07-05 VITALS — BP 108/58 | HR 74 | Ht 71.0 in | Wt 215.8 lb

## 2018-07-05 DIAGNOSIS — G4733 Obstructive sleep apnea (adult) (pediatric): Secondary | ICD-10-CM | POA: Diagnosis not present

## 2018-07-05 DIAGNOSIS — J90 Pleural effusion, not elsewhere classified: Secondary | ICD-10-CM

## 2018-07-05 DIAGNOSIS — R0602 Shortness of breath: Secondary | ICD-10-CM | POA: Diagnosis not present

## 2018-07-05 DIAGNOSIS — I5031 Acute diastolic (congestive) heart failure: Secondary | ICD-10-CM

## 2018-07-05 DIAGNOSIS — J432 Centrilobular emphysema: Secondary | ICD-10-CM | POA: Diagnosis not present

## 2018-07-05 NOTE — Progress Notes (Signed)
Subjective:   PATIENT ID: Christian Rasmussen GENDER: male DOB: 07-28-1940, MRN: [id number]  Synopsis: Referred in 07/2017 for dyspnea, he has a past medical history significant for systolic heart failure, coronary artery disease and mitral valve repair in 2017 and pacer placed.  He also has atrial fibrillation.  He smoked cigarettes until age 78.  He was found on a high-resolution CT scan in 2019 to have mild centrilobular emphysema and a left-sided pleural effusion which seems to have been present since his coronary artery bypass surgery in 2017.  HPI  Chief Complaint  Patient presents with  . Follow-up    states breathing is better, usual DOE; on CPAP, uses occasionally due to nosebleeds   Christian Rasmussen says that he is a little better.  He says that his blood pressure medicine (lisinopril) was changed to 5mg  recently and he feels better now.   He has not been sick with pneumonia or bronchitis since the last visit.  He says that he has been taking Lasix at least at least once a day, sometimes he takes it twice a day.  He has significant swelling in his right leg.  He is asking if he can stop going to pulmonary rehab because he does not think it helps him.  He does not walk at pulmonary rehab he just uses upper body exercises.  Past Medical History:  Diagnosis Date  . 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 Aortic stenosis 10/14/2015  . CAD (coronary artery disease)    status post Taxus stent patency of RCA 2004, Cardiolite negative for ischemia in 2010.  . Diabetes mellitus (Christian Rasmussen)   . Dyslipidemia   . History of kidney stones   . Hypertension   . Iron deficiency anemia 02/02/2018  . Mitral regurgitation 10/15/2015   Moderate-severe by intra-operative TEE  . Overweight   . Right bundle branch block (RBBB) with left anterior hemiblock   . 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 Snores   . Typical atrial flutter [facility])    s/p ablation 11-23-2012 by Dr Rayann Heman      Review of Systems  Constitutional: Positive for malaise/fatigue. Negative for chills and diaphoresis.  HENT: Negative for congestion, ear discharge and nosebleeds.   Respiratory: Positive for shortness of breath. Negative for cough and wheezing.   Cardiovascular: Negative for chest pain, claudication and leg swelling.      Objective:  Physical Exam   Vitals:   07/05/18 0947  BP: (!) 108/58  Pulse: 74  SpO2: 91%  Weight: 215 lb 12.8 oz (97.9 kg)  Height: 5\' 11"  (1.803 m)   RA  Gen: well appearing HENT: OP clear, TM's clear, neck supple PULM: Diminished left base B, normal percussion CV: RRR, no mgr, trace edema GI: BS+, soft, nontender Derm: no cyanosis or rash Psyche: normal mood and affect    CBC    Component Value Date/Time   WBC 7.8 06/26/2018 0930   RBC 3.92 (L) 06/26/2018 0930   HGB 12.0 (L) 06/26/2018 0930   HGB 12.9 (L) 09/12/2017 0958   HCT 37.5 (L) 06/26/2018 0930   HCT 38.2 09/12/2017 0958   PLT 296 06/26/2018 0930  PLT 323 09/12/2017 0958   MCV 95.7 06/26/2018 0930   MCV 87 09/12/2017 0958   MCH 30.6 06/26/2018 0930   MCHC 32.0 06/26/2018 0930   RDW 16.8 (H) 06/26/2018 0930   RDW 16.1 (H) 09/12/2017 0958   LYMPHSABS 0.6 (L) 04/11/2018 1228   MONOABS 0.9 04/11/2018 1228   EOSABS 0.2 04/11/2018 1228   BASOSABS 0.1 04/11/2018 1228     Chest imaging: Feb 2019 chest imaging shows a pacemaker, cardiomegaly, chronic appearing left-sided pleural effusion April 2019 high-resolution CT chest images independently reviewed showing no evidence of interstitial lung disease, some mild centrilobular emphysema, small pleural effusion noted on the left September  2019 CT angiogram chest images independently reviewed showing bilateral effusions, left greater than right, bilateral airspace disease consistent with pulmonary edema, mild centrilobular emphysema, no pulmonary embolism.  PFT: June 2017 PFT: Ratio 76%, FEV1 2.2 L 66% predicted, FVC 2.83 L 63% predicted, total lung capacity 5.4 L 74% predicted, DLCO 21.1 mL 62% predicted April 2019 ratio 73%, FVC 2.52 L 57% predicted, total lung capacity 4.4 L 60% predicted, DLCO 13.97 mL 41% predicted September 2019 ratio 76%, FVC 2.49 L 56% predicted, total lung capacity 4.6 L 63% predicted, DLCO 11.75 mL 34% predicted  Labs: May 2019 thoracentesis on the left LDH slightly elevated at 107, total protein 3.5, white blood cell count 673 with a lymphocyte predominance at 57%  Path:  Echo: Sep 16, 2017 trans-esophageal echocardiogram showed an LVEF of 50 to 55%, PA pressure estimate of 49 mmHg, moderate aortic regurgitation and moderate mitral stenosis   Heart Catheterization:      Assessment & Plan:   Pleural effusion, bilateral - Plan: DG Chest 2 View  OSA (obstructive sleep apnea)  Acute diastolic CHF (congestive heart failure) (HCC)  SOB (shortness of breath)  Centrilobular emphysema (Christian Rasmussen)  Discussion: From my standpoint Christian Rasmussen has made no improvement since the last visit largely due to a lack of significant exercise despite going to pulmonary rehab.  I think he has been told that he is not supposed to do any lower body exercises because of his sciatica.  I think this is hugely incorrect information and he would actually be much better off to participate in an aggressive strength training routine.  So there is no surprise to me that he does not feel any improvement if he has not been walking lately.  Plan: Pleural effusion: We will check a chest x-ray to make sure this is not worsened Keep taking diuretics as prescribed  Chronic diastolic heart failure: Keep taking your diuretics, Coreg,  lisinopril as prescribed Minimize sodium intake to less than 2 g a day Weigh yourself daily  Centrilobular emphysema: For now to continue to stay off of any inhaled medicines as these did not really help you in the past  Shortness of breath: Due predominantly to physical deconditioning Try to take in 100 g of protein during the daytime I strongly recommend starting lower body strength training exercises and walking A simple start would be to stand from a chair 10 times, 3 times a day  We will plan on seeing you back in 4 to 6 months or sooner if needed  > 50% of this 25 min visit spent face to face  Current Outpatient Medications:  .  amLODipine (NORVASC) 10 MG tablet, Take 1 tablet (10 mg total) by mouth daily., Disp: 90 tablet, Rfl: 3 .  amoxicillin (AMOXIL) 500 MG capsule, Take 2,000 mg by mouth See admin instructions. Take 4  capsules (2000 mg) by, Disp: , Rfl:  .  aspirin EC 81 MG tablet, Take 1 tablet (81 mg total) by mouth daily. (Patient taking differently: Take 81 mg by mouth every evening. ), Disp: 90 tablet, Rfl: 3 .  ASTAXANTHIN PO, Take 10 mg by mouth daily. , Disp: , Rfl:  .  atorvastatin (LIPITOR) 40 MG tablet, Take 40 mg by mouth daily. , Disp: , Rfl:  .  carvedilol (COREG) 12.5 MG tablet, Take 1.5 tablets (18.75 mg total) by mouth 2 (two) times daily., Disp: 270 tablet, Rfl: 3 .  Coenzyme Q10 200 MG capsule, Take 200 mg by mouth daily. , Disp: , Rfl:  .  furosemide (LASIX) 40 MG tablet, Take 40 mg by mouth See admin instructions. Take 1 tablet (40 mg) by mouth scheduled in the morning, may take an additional dose in the afternoon if needed for ankle swelling., Disp: , Rfl:  .  gabapentin (NEURONTIN) 100 MG capsule, Take 1 capsule (100 mg total) by mouth 3 (three) times daily., Disp: 90 capsule, Rfl: 2 .  lisinopril (PRINIVIL,ZESTRIL) 5 MG tablet, Take 1 tablet (5 mg total) by mouth daily. (Patient taking differently: Take 20 mg by mouth daily. ), Disp: 90 tablet, Rfl: 3 .   metFORMIN (GLUCOPHAGE) 500 MG tablet, Take 1,000 mg by mouth 2 (two) times daily with a meal. , Disp: , Rfl: 2 .  potassium chloride (KLOR-CON 10) 10 MEQ tablet, Take 1 tablet (10 mEq total) by mouth 2 (two) times daily., Disp: 180 tablet, Rfl: 3 .  SF 5000 PLUS 1.1 % CREA dental cream, Apply 1 application topically See admin instructions. Use 1 application 2 times a week at bedtime., Disp: , Rfl:  .  warfarin (COUMADIN) 5 MG tablet, Take 1 1/2 tablets daily except 1 tablet on Mondays and Fridays (Patient taking differently: Take 2.5-5 mg by mouth See admin instructions. Take 0.5 tablet (2.5 mg) by mouth on Mondays, Wednesdays, & Fridays.  Take 1 tablet (5 mg) by mouth on Tuesdays, Thursdays, Saturdays, & Sundays.), Disp: 135 tablet, Rfl: 3

## 2018-07-05 NOTE — Patient Instructions (Signed)
Pleural effusion: We will check a chest x-ray to make sure this is not worsened Keep taking diuretics as prescribed  Chronic diastolic heart failure: Keep taking your diuretics, Coreg, lisinopril as prescribed Minimize sodium intake to less than 2 g a day Weigh yourself daily  Centrilobular emphysema: For now to continue to stay off of any inhaled medicines as these did not really help you in the past  Shortness of breath: Due predominantly to physical deconditioning Try to take in 100 g of protein during the daytime I strongly recommend starting lower body strength training exercises and walking A simple start would be to stand from a chair 10 times, 3 times a day  We will plan on seeing you back in 4 to 6 months or sooner if needed

## 2018-07-06 ENCOUNTER — Encounter (HOSPITAL_COMMUNITY)
Admission: RE | Admit: 2018-07-06 | Discharge: 2018-07-06 | Disposition: A | Discharge status: Home / Self Care | Payer: Medicare Other | Source: Ambulatory Visit | Attending: Pulmonary Disease | Admitting: Pulmonary Disease

## 2018-07-06 DIAGNOSIS — J432 Centrilobular emphysema: Secondary | ICD-10-CM | POA: Diagnosis not present

## 2018-07-06 NOTE — Progress Notes (Signed)
Daily Session Note  Patient Details  Name: Christian Rasmussen MRN: [id number] Date of Birth: 1941-04-24 Referring Provider:     PULMONARY REHAB OTHER RESP ORIENTATION from 03/20/2018 in [place]  Referring Provider  McQuaid      Encounter Date: 07/06/2018  Check In: Session Check In - 07/06/18 1045      Check-In   Supervising physician immediately available to respond to emergencies  See telemetry face sheet for immediately available MD    Location  [facility]    Staff Present  Russella Dar, MS, EP, [facility], Exercise Physiologist;Jacquese Cassarino Wynetta Emery, RN, BSN    Medication changes reported      No    Fall or balance concerns reported     No    Warm-up and Cool-down  Performed as group-led instruction    Resistance Training Performed  Yes    VAD Patient?  No    PAD/SET Patient?  No      Pain Assessment   Currently in Pain?  No/denies    Pain Score  0-No pain    Multiple Pain Sites  No       Capillary Blood Glucose: No results found for this or any previous visit (from the past 24 hour(s)).    Social History   Tobacco Use  Smoking Status Former Smoker  . Packs/day: 1.00  . Years: 28.00  . Pack years: 28.00  . Types: Cigarettes  . Start date: 10/21/1956  . Last attempt to quit: 05/10/1985  . Years since quitting: 33.1  Smokeless Tobacco Never Used    Goals Met:  Proper associated with RPD/PD & O2 Sat Independence with exercise equipment Improved SOB with ADL's Using PLB without cueing & demonstrates good technique Exercise tolerated well No report of cardiac concerns or symptoms Strength training completed today  Goals Unmet:  Not Applicable  Comments: Pt able to follow exercise prescription today without complaint.  Will continue to monitor for progression. Check out 1145.   Dr. Sinda Du is Medical Director for [facility] Pulmonary Rehab.

## 2018-07-10 ENCOUNTER — Ambulatory Visit (INDEPENDENT_AMBULATORY_CARE_PROVIDER_SITE_OTHER): Payer: Medicare Other | Admitting: Cardiovascular Disease

## 2018-07-10 ENCOUNTER — Encounter: Payer: Self-pay | Admitting: Cardiovascular Disease

## 2018-07-10 VITALS — BP 112/70 | HR 60 | Ht 71.0 in | Wt 218.0 lb

## 2018-07-10 DIAGNOSIS — I050 Rheumatic mitral stenosis: Secondary | ICD-10-CM

## 2018-07-10 DIAGNOSIS — Z950 Presence of cardiac pacemaker: Secondary | ICD-10-CM | POA: Diagnosis not present

## 2018-07-10 DIAGNOSIS — I350 Nonrheumatic aortic (valve) stenosis: Secondary | ICD-10-CM | POA: Diagnosis not present

## 2018-07-10 DIAGNOSIS — Z9889 Other specified postprocedural states: Secondary | ICD-10-CM | POA: Diagnosis not present

## 2018-07-10 DIAGNOSIS — I442 Atrioventricular block, complete: Secondary | ICD-10-CM

## 2018-07-10 DIAGNOSIS — I25708 Atherosclerosis of coronary artery bypass graft(s), unspecified, with other forms of angina pectoris: Secondary | ICD-10-CM | POA: Diagnosis not present

## 2018-07-10 DIAGNOSIS — E785 Hyperlipidemia, unspecified: Secondary | ICD-10-CM

## 2018-07-10 DIAGNOSIS — I5032 Chronic diastolic (congestive) heart failure: Secondary | ICD-10-CM

## 2018-07-10 DIAGNOSIS — I4892 Unspecified atrial flutter: Secondary | ICD-10-CM

## 2018-07-10 MED ORDER — AMLODIPINE BESYLATE 5 MG PO TABS: 5.0000 mg | ORAL_TABLET | Freq: Every day | ORAL | 3 refills | Status: DC

## 2018-07-10 NOTE — Addendum Note (Signed)
Addended by: Levonne Hubert on: 07/10/2018 11:59 AM   Modules accepted: Orders

## 2018-07-10 NOTE — Patient Instructions (Addendum)
Medication Instructions:  Your physician has recommended you make the following change in your medication: Decrease Amlodipine to 5 mg Daily   Labwork:NONE    Testing/Procedures:NONE   Follow-Up:Your physician wants you to follow-up in: 6 months with Dr. Bronson Ing. You will receive a reminder letter in the mail two months in advance. If you don't receive a letter, please call our office to schedule the follow-up appointment.    Any Other Special Instructions Will Be Listed Below (If Applicable).  If you need a refill on your cardiac medications before your next appointment, please call your pharmacy.

## 2018-07-10 NOTE — Progress Notes (Signed)
SUBJECTIVE: The patient presents for routine follow-up.  He underwent TEE on 06/26/2018 which demonstrated mild to moderate aortic valve stenosis.  Mitral valve repair noted with moderate mitral stenosis and mild mitral regurgitation.  LVEF 50 to 55%.  Right ventricular systolic function was moderately reduced with mild right ventricular enlargement. Overall it was felt there were no significant changes since the prior study.  He saw pulmonary on 07/05/2018 and it was felt that he has not made any improvement with respect to shortness of breath due to lack of significant exercise and pulmonary rehab was recommended.  Chest x-ray on 07/05/2018 showed stable left pleural effusion with associated scarring.  There were no acute abnormalities.  He also has a history of CABG and chronic diastolic heart failure. 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 Lovena Le.  He denies chest pain and palpitations as well as leg swelling.  Chronic exertional dyspnea is stable.  His wife said that he is stubborn and does not get any physical activity.   Soc Hx: Their only daughter committed suicide on Oct 01, 2016. Her son lives with the patient and his wife.   Review of Systems: As per "subjective", otherwise negative.  No Known Allergies  Current Outpatient Medications  Medication Sig Dispense Refill  . amLODipine (NORVASC) 10 MG tablet Take 1 tablet (10 mg total) by mouth daily. 90 tablet 3  . amoxicillin (AMOXIL) 500 MG capsule Take 2,000 mg by mouth See admin instructions. Take 4 capsules (2000 mg) by for dental cleaning    . aspirin EC 81 MG tablet Take 1 tablet (81 mg total) by mouth daily. (Patient taking differently: Take 81 mg by mouth every evening. ) 90 tablet 3  . ASTAXANTHIN PO Take 10 mg by mouth daily.     Marland Kitchen atorvastatin (LIPITOR) 40 MG tablet Take 40 mg by mouth daily.     . carvedilol (COREG) 12.5 MG tablet Take 1.5 tablets (18.75 mg total) by mouth 2  (two) times daily. 270 tablet 3  . Coenzyme Q10 200 MG capsule Take 200 mg by mouth daily.     . furosemide (LASIX) 40 MG tablet Take 40 mg by mouth See admin instructions. Take 1 tablet (40 mg) by mouth scheduled in the morning, may take an additional dose in the afternoon if needed for ankle swelling.    Marland Kitchen lisinopril (PRINIVIL,ZESTRIL) 5 MG tablet Take 1 tablet (5 mg total) by mouth daily. (Patient taking differently: Take 20 mg by mouth daily. ) 90 tablet 3  . metFORMIN (GLUCOPHAGE) 500 MG tablet Take 1,000 mg by mouth 2 (two) times daily with a meal.   2  . potassium chloride (KLOR-CON 10) 10 MEQ tablet Take 1 tablet (10 mEq total) by mouth 2 (two) times daily. 180 tablet 3  . SF 5000 PLUS 1.1 % CREA dental cream Apply 1 application topically See admin instructions. Use 1 application 2 times a week at bedtime.    Marland Kitchen warfarin (COUMADIN) 5 MG tablet Take 1 1/2 tablets daily except 1 tablet on Mondays and Fridays (Patient taking differently: Take 2.5-5 mg by mouth See admin instructions. Take 0.5 tablet (2.5 mg) by mouth on Mondays, Wednesdays, & Fridays.  Take 1 tablet (5 mg) by mouth on Tuesdays, Thursdays, Saturdays, & Sundays.) 135 tablet 3   No current facility-administered medications for this visit.     Past Medical History:  Diagnosis Date  . 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 Aortic stenosis 10/14/2015  . CAD (coronary artery disease)    status post Taxus stent patency of RCA 2004, Cardiolite negative for ischemia in 2010.  . Diabetes mellitus ([place])   . Dyslipidemia   . History of kidney stones   . Hypertension   . Iron deficiency anemia 02/02/2018  . Mitral regurgitation 10/15/2015   Moderate-severe by intra-operative TEE  . Overweight   . Right bundle branch block (RBBB) with left anterior hemiblock   . 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 Snores   . Typical atrial flutter [facility])    s/p ablation 11-23-2012 by Dr Rayann Heman    Past Surgical History:  Procedure Laterality Date  . ABLATION  11-23-2012   s/p cavotricuspid isthmus ablation by Dr Rayann Heman  . ANKLE SURGERY Left    total ankle replacement  . ATRIAL FLUTTER ABLATION N/A 11/23/2012   Procedure: ATRIAL FLUTTER ABLATION;  Surgeon: Thompson Grayer, MD;  Location: [facility] CATH LAB;  Service: Cardiovascular;  Laterality: N/A;  . CARDIAC CATHETERIZATION N/A 10/13/2015   Procedure: Left Heart Cath and Coronary Angiography;  Surgeon: Peter M Martinique, MD;  Location: [place] CV LAB;  Service: Cardiovascular;  Laterality: N/A;  . CATARACT EXTRACTION Right   . CATARACT EXTRACTION W/PHACO Left 09/13/2013   Procedure: CATARACT EXTRACTION PHACO AND INTRAOCULAR LENS PLACEMENT (IOC);  Surgeon: Tonny Branch, MD;  Location: [facility] ORS;  Service: Ophthalmology;  Laterality: Left;  CDE 9.38  . COLONOSCOPY N/A 10/10/2014   Procedure: COLONOSCOPY;  Surgeon: Rogene Houston, MD;  Location: [facility] ENDO SUITE;  Service: Endoscopy;  Laterality: N/A;  830  . CORONARY ANGIOPLASTY     3 stents  . CORONARY ARTERY BYPASS GRAFT N/A 10/15/2015   Procedure: CORONARY ARTERY BYPASS GRAFTING (CABG) X4 LIMA-LAD; SEQ SVG-PD-PL; SVG-OM1 ENDOSCOPIC GREATER SAPHENOUS VEIN HARVEST(EVH) BILAT LOWER EXTREM;  Surgeon: Rexene Alberts, MD;  Location: [place];  Service: Open Heart Surgery;  Laterality: N/A;  . EP IMPLANTABLE DEVICE N/A 10/20/2015   Procedure: Pacemaker Implant;  Surgeon: Evans Lance, MD;  Location: [place] CV LAB;  Service: Cardiovascular;  Laterality: N/A;  . MITRAL VALVE REPAIR N/A 10/15/2015   Procedure: MITRAL VALVE REPAIR (MVR), # 28 MEMO 3-D RING ANNULOPLASTY AND COMPLEX VALVE REPAIR;  Surgeon: Rexene Alberts,  MD;  Location: [place];  Service: Open Heart Surgery;  Laterality: N/A;  . TEE WITHOUT CARDIOVERSION N/A 10/15/2015   Procedure: TRANSESOPHAGEAL ECHOCARDIOGRAM (TEE);  Surgeon: Rexene Alberts, MD;  Location: [place];  Service: Open Heart Surgery;  Laterality: N/A;  . TEE WITHOUT CARDIOVERSION N/A 09/16/2017   Procedure: TRANSESOPHAGEAL ECHOCARDIOGRAM (TEE);  Surgeon: Dorothy Spark, MD;  Location: [facility] ENDOSCOPY;  Service: Cardiovascular;  Laterality: N/A;  . TEE WITHOUT CARDIOVERSION N/A 06/26/2018   Procedure: TRANSESOPHAGEAL ECHOCARDIOGRAM (TEE);  Surgeon: [name], MD;  Location: [place];  Service: Cardiology;  Laterality: N/A;    Social History   Socioeconomic History  . Marital status: Married    Spouse name: Not on file  . Number of children: 1  . Years of education: Not on file  . Highest education level: Not on file  Occupational History  . 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 Financial resource strain: Not on file  . Food insecurity:    Worry: Not on file    Inability: Not on file  . Transportation needs:    Medical: Not on file    Non-medical: Not on file  Tobacco Use  . Smoking status: Former Smoker    Packs/day: 1.00    Years: 28.00    Pack years: 28.00    Types: Cigarettes    Start date: 10/21/1956    Last attempt to quit: 05/10/1985    Years since quitting: 33.1  . Smokeless tobacco: Never Used  Substance and Sexual Activity  . Alcohol use: Not Currently    Alcohol/week: 0.0 standard drinks    Comment: occasional wine  . Drug use: No  . Sexual activity: Yes    Birth control/protection: None  Lifestyle  . Physical activity:    Days per week: Not on file    Minutes per session: Not on file  . Stress: Not on file  Relationships  . Social connections:    Talks on phone: Not on file    Gets together: Not on file    Attends religious service: Not on file    Active member of club or organization: Not on file    Attends  meetings of clubs or organizations: Not on file    Relationship status: Not on file  . Intimate partner violence:    Fear of current or ex partner: Not on file    Emotionally abused: Not on file    Physically abused: Not on file    Forced sexual activity: Not on file  Other Topics Concern  . Not on file  Social History Narrative   Lives in [place] with spouse.   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 Neck: No JVD, no thyromegaly. Lungs: Clear to auscultation bilaterally with normal respiratory effort. CV: Regular rate and rhythm, normal S1/S2, no S3/S4, I/VI early systolic murmur over RUSB.  No lower extremityedema. Abdomen: Soft, nontender, no distention.  Neurologic: Alert and oriented.  Psych: Normal affect. Skin: Normal. Musculoskeletal: No gross deformities.    ECG: Reviewed above under Subjective   Labs: Lab Results  Component Value Date/Time   K 5.0 04/11/2018 12:28 PM   BUN 29 (H) 04/11/2018 12:28 PM   BUN 21 09/12/2017 09:58 AM   CREATININE 1.12 04/11/2018 12:28 PM   ALT 14 04/11/2018 12:28 PM   TSH 4.924 (H) 03/18/2016 09:18 PM   HGB 12.0 (L) 06/26/2018 09:30 AM   HGB 12.9 (L) 09/12/2017 09:58 AM     Lipids: Lab Results  Component Value Date/Time   LDLCALC 43 10/14/2015 04:31 AM   CHOL 108 10/14/2015 04:31 AM   TRIG 157 (H) 10/14/2015 04:31 AM   HDL 34 (L) 10/14/2015 04:31 AM       ASSESSMENT AND PLAN: 1. CAD s/p 4-v CABG (June 2017): Symptomatically stable.  Continue aspirin, Lipitor, Coreg, and lisinopril.   2. S/p mitral valve repair: Moderate stenosis and mild regurgitation by TEE in February 2020.  I will monitor.  3. S/p PPM for  CHB: Stable.  Device is  functioning normally.  F/u with Dr. Lovena Le.  4. Essential HTN: It is normal today and has been low normal.  He would like to cut back on a medication.  I will reduce amlodipine to 5 mg.  5. Hyperlipidemia: Continue Lipitor.  LDL 65 on 05/09/2018.  6. Chronic diastolic heart failure, EF 50-55: Euvolemic. Continue Lasix 40 mg daily.Hehas been instructed to weigh himself daily. Should he develop leg swelling, shortness of breath, or weight gain of 3 pounds within 24 hours, hecan take an extra 40 mg.  Continue carvedilol and lisinopril.  7. Atrial flutter:Anticoagulated withwarfarin.  INR 2.97 on 06/26/2018.  Hemoglobin 12.  8. Aortic stenosis and regurgitation: Mild to moderate aortic stenosis by TEE in February 2020.  I will monitor.  9.  Shortness of breath: See discussion above.  It is felt that shortness of breath is due to physical deconditioning and physical activity and pulmonary rehab has been recommended.    Disposition: Follow up 6 months.   Kate Sable, M.D., F.A.C.C.

## 2018-07-11 ENCOUNTER — Encounter (HOSPITAL_COMMUNITY)
Admission: RE | Admit: 2018-07-11 | Discharge: 2018-07-11 | Disposition: A | Discharge status: Home / Self Care | Payer: Medicare Other | Source: Ambulatory Visit | Attending: Pulmonary Disease | Admitting: Pulmonary Disease

## 2018-07-11 DIAGNOSIS — J432 Centrilobular emphysema: Secondary | ICD-10-CM | POA: Diagnosis not present

## 2018-07-11 NOTE — Progress Notes (Signed)
Daily Session Note  Patient Details  Name: Christian Rasmussen MRN: [id number] Date of Birth: Feb 16, 1941 Referring Provider:     PULMONARY REHAB OTHER RESP ORIENTATION from 03/20/2018 in [place]  Referring Provider  [place]      Encounter Date: 07/11/2018  Check In: Session Check In - 07/11/18 1106      Check-In   Supervising physician immediately available to respond to emergencies  See telemetry face sheet for immediately available MD    Location  [facility]    Staff Present  Russella Dar, MS, EP, [facility], Exercise Physiologist;Eulas Schweitzer Zachery Conch, Exercise Physiologist    Medication changes reported      No    Fall or balance concerns reported     No    Warm-up and Cool-down  Performed as group-led instruction    Resistance Training Performed  Yes    VAD Patient?  No    PAD/SET Patient?  No      Pain Assessment   Currently in Pain?  No/denies    Pain Score  0-No pain    Multiple Pain Sites  No       Capillary Blood Glucose: No results found for this or any previous visit (from the past 24 hour(s)).    Social History   Tobacco Use  Smoking Status Former Smoker  . Packs/day: 1.00  . Years: 28.00  . Pack years: 28.00  . Types: Cigarettes  . Start date: 10/21/1956  . Last attempt to quit: 05/10/1985  . Years since quitting: 33.1  Smokeless Tobacco Never Used    Goals Met:  Proper associated with RPD/PD & O2 Sat Independence with exercise equipment Exercise tolerated well No report of cardiac concerns or symptoms Strength training completed today  Goals Unmet:  Not Applicable  Comments: Pt able to follow exercise prescription today without complaint.  Will continue to monitor for progression. Check out 1145.   Dr. Kate Sable is Medical Director for [facility] Cardiac and Pulmonary Rehab.

## 2018-07-12 DIAGNOSIS — I509 Heart failure, unspecified: Secondary | ICD-10-CM | POA: Diagnosis not present

## 2018-07-12 DIAGNOSIS — Z299 Encounter for prophylactic measures, unspecified: Secondary | ICD-10-CM | POA: Diagnosis not present

## 2018-07-12 DIAGNOSIS — Z6831 Body mass index (BMI) 31.0-31.9, adult: Secondary | ICD-10-CM | POA: Diagnosis not present

## 2018-07-12 DIAGNOSIS — R05 Cough: Secondary | ICD-10-CM | POA: Diagnosis not present

## 2018-07-12 DIAGNOSIS — Z87891 Personal history of nicotine dependence: Secondary | ICD-10-CM | POA: Diagnosis not present

## 2018-07-12 DIAGNOSIS — I10 Essential (primary) hypertension: Secondary | ICD-10-CM | POA: Diagnosis not present

## 2018-07-12 DIAGNOSIS — J069 Acute upper respiratory infection, unspecified: Secondary | ICD-10-CM | POA: Diagnosis not present

## 2018-07-13 ENCOUNTER — Encounter (HOSPITAL_COMMUNITY): Payer: Medicare Other

## 2018-07-13 NOTE — Progress Notes (Signed)
Pulmonary Individual Treatment Plan  Patient Details  Name: Christian Rasmussen MRN: [id number] Date of Birth: 07/18/1940 Referring Provider:     PULMONARY REHAB OTHER RESP ORIENTATION from 03/20/2018 in [facility]  Referring Provider  McQuaid      Initial Encounter Date:    PULMONARY REHAB OTHER RESP ORIENTATION from 03/20/2018 in [facility]  Date  03/20/18      Visit Diagnosis: Centrilobular emphysema ([place])  Patient's Home Medications on Admission:   Current Outpatient Medications:  .  amLODipine (NORVASC) 5 MG tablet, Take 1 tablet (5 mg total) by mouth daily., Disp: 90 tablet, Rfl: 3 .  amoxicillin (AMOXIL) 500 MG capsule, Take 2,000 mg by mouth See admin instructions. Take 4 capsules (2000 mg) by for dental cleaning, Disp: , Rfl:  .  aspirin EC 81 MG tablet, Take 1 tablet (81 mg total) by mouth daily. (Patient taking differently: Take 81 mg by mouth every evening. ), Disp: 90 tablet, Rfl: 3 .  ASTAXANTHIN PO, Take 10 mg by mouth daily. , Disp: , Rfl:  .  atorvastatin (LIPITOR) 40 MG tablet, Take 40 mg by mouth daily. , Disp: , Rfl:  .  carvedilol (COREG) 12.5 MG tablet, Take 1.5 tablets (18.75 mg total) by mouth 2 (two) times daily., Disp: 270 tablet, Rfl: 3 .  Coenzyme Q10 200 MG capsule, Take 200 mg by mouth daily. , Disp: , Rfl:  .  furosemide (LASIX) 40 MG tablet, Take 40 mg by mouth See admin instructions. Take 1 tablet (40 mg) by mouth scheduled in the morning, may take an additional dose in the afternoon if needed for ankle swelling., Disp: , Rfl:  .  lisinopril (PRINIVIL,ZESTRIL) 5 MG tablet, Take 1 tablet (5 mg total) by mouth daily. (Patient taking differently: Take 20 mg by mouth daily. ), Disp: 90 tablet, Rfl: 3 .  metFORMIN (GLUCOPHAGE) 500 MG tablet, Take 1,000 mg by mouth 2 (two) times daily with a meal. , Disp: , Rfl: 2 .  potassium chloride (KLOR-CON 10) 10 MEQ tablet, Take 1 tablet (10 mEq total) by mouth 2 (two) times  daily., Disp: 180 tablet, Rfl: 3 .  SF 5000 PLUS 1.1 % CREA dental cream, Apply 1 application topically See admin instructions. Use 1 application 2 times a week at bedtime., Disp: , Rfl:  .  warfarin (COUMADIN) 5 MG tablet, Take 1 1/2 tablets daily except 1 tablet on Mondays and Fridays (Patient taking differently: Take 2.5-5 mg by mouth See admin instructions. Take 0.5 tablet (2.5 mg) by mouth on Mondays, Wednesdays, & Fridays.  Take 1 tablet (5 mg) by mouth on Tuesdays, Thursdays, Saturdays, & Sundays.), Disp: 135 tablet, Rfl: 3  Past Medical History: Past Medical History:  Diagnosis Date  . 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 Aortic stenosis 10/14/2015  . CAD (coronary artery disease)    status post Taxus stent patency of RCA 2004, Cardiolite negative for ischemia in 2010.  . Diabetes mellitus ([place])   . Dyslipidemia   . History of kidney stones   . Hypertension   . Iron deficiency anemia 02/02/2018  . Mitral regurgitation 10/15/2015   Moderate-severe by intra-operative TEE  . Overweight   . Right bundle branch block (RBBB) with left anterior hemiblock   . 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 Snores   . Typical atrial flutter [facility])    s/p ablation 11-23-2012 by Dr Rayann Heman    Tobacco Use: Social History   Tobacco Use  Smoking Status Former Smoker  . Packs/day: 1.00  . Years: 28.00  . Pack years: 28.00  . Types: Cigarettes  . Start date: 10/21/1956  . Last attempt to quit: 05/10/1985  . Years since quitting: 33.1  Smokeless Tobacco Never Used    Labs: Recent Review Flowsheet Data    Labs for ITP Cardiac and Pulmonary Rehab Latest Ref Rng &  Units 10/16/2015 10/16/2015 10/16/2015 10/16/2015 10/18/2015   Cholestrol 0 - 200 mg/dL - - - - -   LDLCALC 0 - 99 mg/dL - - - - -   HDL >40 mg/dL - - - - -   Trlycerides <150 mg/dL - - - - -   Hemoglobin A1c 4.8 - 5.6 % - - - - -   PHART 7.350 - 7.450 - 7.394 7.415 - -   PCO2ART 35.0 - 45.0 mmHg - 35.5 36.2 - -   HCO3 20.0 - 24.0 mEq/L - 21.6 23.2 - -   TCO2 0 - 100 mmol/L 22 23 24 20 26    ACIDBASEDEF 0.0 - 2.0 mmol/L - 3.0(H) 1.0 - -   O2SAT % - 96.0 97.0 - -      Capillary Blood Glucose: Lab Results  Component Value Date   GLUCAP 122 (H) 09/16/2017   GLUCAP 168 (H) 03/20/2016   GLUCAP 94 03/19/2016   GLUCAP 114 (H) 03/19/2016   GLUCAP 96 03/19/2016     Pulmonary Assessment Scores: Pulmonary Assessment Scores    Row Name 03/20/18 1518         ADL UCSD   ADL Phase  Entry     SOB Score total  45     Rest  0     Walk  9     Stairs  2     Bath  1     Dress  1     Shop  0       CAT Score   CAT Score  16       mMRC Score   mMRC Score  3        Pulmonary Function Assessment: Pulmonary Function Assessment - 03/20/18 1515      Pulmonary Function Tests   FVC%  54 %    FEV1%  54 %    FEV1/FVC Ratio  73    RV%  4.37 %    DLCO%  13.97 %      Initial Spirometry Results   FVC%  57 %    FEV1%  57 %    FEV1/FVC Ratio  73      Post Bronchodilator Spirometry Results   FVC%  57 %    FEV1%  57 %    FEV1/FVC Ratio  73      Breath   Bilateral Breath Sounds  Clear    Shortness of Breath  Yes;Limiting activity       Exercise Target Goals: Exercise Program Goal: Individual exercise prescription set using results from initial 6 min walk test and THRR while considering  patient's activity barriers and safety.   Exercise Prescription Goal: Initial exercise prescription builds to 30-45 minutes a day of aerobic activity, 2-3 days per week.  Home exercise guidelines will be given to patient during program as part of exercise prescription that the participant will  acknowledge.  Activity Barriers & Risk Stratification: Activity Barriers & Cardiac Risk Stratification - 03/20/18 1514      Activity Barriers & Cardiac Risk Stratification   Cardiac Risk Stratification  High       6 Minute Walk: 6 Minute Walk    Row Name 03/20/18 1353         6 Minute Walk   Phase  Initial     Distance  800 feet     Walk Time  5.41 minutes     # of Rest Breaks  1     MPH  1.51     METS  2.16     RPE  13     Perceived Dyspnea   13     VO2 Peak  6.65     Symptoms  No     Resting HR  83 bpm     Resting BP  108/60     Resting Oxygen Saturation   94 %     Exercise Oxygen Saturation  during 6 min walk  89 %     Max Ex. HR  115 bpm     Max Ex. BP  144/72     2 Minute Post BP  122/70        Oxygen Initial Assessment: Oxygen Initial Assessment - 03/20/18 1514      Home Oxygen   Home Oxygen Device  None       Oxygen Re-Evaluation: Oxygen Re-Evaluation    Row Name 04/27/18 1525 05/18/18 1510 06/15/18 1250 07/13/18 0746       Program Oxygen Prescription   Program Oxygen Prescription  None  None  None  None      Home Oxygen   Home Oxygen Device  Liquid Oxygen  None  None  Liquid Oxygen    Sleep Oxygen Prescription  None  None  None  None    Home Exercise Oxygen Prescription  None  None  None  None    Home at Rest Exercise Oxygen Prescription  None  None  None  None      Goals/Expected Outcomes   Short Term Goals  To learn and understand importance of monitoring SPO2 with pulse oximeter and demonstrate accurate use of the pulse oximeter.;To learn and understand importance of maintaining oxygen saturations>88%;To learn and demonstrate proper pursed lip breathing techniques or other breathing techniques.  To learn and understand importance of monitoring SPO2 with pulse oximeter and demonstrate accurate use of the pulse oximeter.;To learn and understand importance of maintaining oxygen saturations>88%;To learn and demonstrate proper pursed lip breathing  techniques or other breathing techniques.  To learn and understand importance of monitoring SPO2 with pulse oximeter and demonstrate accurate use of the pulse oximeter.;To learn and understand importance of maintaining oxygen saturations>88%;To learn and demonstrate proper pursed lip breathing techniques or other breathing techniques.  To learn and understand importance of monitoring SPO2 with pulse oximeter and demonstrate accurate use of the pulse oximeter.;To learn and understand importance of maintaining oxygen saturations>88%;To learn and demonstrate proper pursed lip breathing techniques or other breathing techniques.    Long  Term Goals  Verbalizes importance of monitoring SPO2 with pulse oximeter and return demonstration;Maintenance of O2 saturations>88%;Exhibits proper breathing techniques, such as pursed lip breathing or other method taught during program session  Verbalizes importance of monitoring SPO2 with pulse oximeter and return demonstration;Maintenance of O2  saturations>88%;Exhibits proper breathing techniques, such as pursed lip breathing or other method taught during program session  Verbalizes importance of monitoring SPO2 with pulse oximeter and return demonstration;Maintenance of O2 saturations>88%;Exhibits proper breathing techniques, such as pursed lip breathing or other method taught during program session  Verbalizes importance of monitoring SPO2 with pulse oximeter and return demonstration;Maintenance of O2 saturations>88%;Exhibits proper breathing techniques, such as pursed lip breathing or other method taught during program session    Comments  Patient is able to verbalize importance of monitoring his SPO2 with pulse oximeter and demonstrates proper pursed lip breathing technique during sessions.   Patient is able to verbalize importance of monitoring his SPO2 with pulse oximeter and demonstrates proper pursed lip breathing technique during sessions.   Patient is able to verbalize  importance of monitoring his SPO2 with pulse oximeter and demonstrates proper pursed lip breathing technique during sessions.   Patient is able to verbalize importance of monitoring his SPO2 with pulse oximeter and demonstrates proper pursed lip breathing technique during sessions.     Goals/Expected Outcomes  Patient will continue to meet his short and long term goals.   Patient will continue to meet his short and long term goals.   Patient will continue to meet his short and long term goals.   Patient will continue to meet his short and long term goals.        Oxygen Discharge (Final Oxygen Re-Evaluation): Oxygen Re-Evaluation - 07/13/18 0746      Program Oxygen Prescription   Program Oxygen Prescription  None      Home Oxygen   Home Oxygen Device  Liquid Oxygen    Sleep Oxygen Prescription  None    Home Exercise Oxygen Prescription  None    Home at Rest Exercise Oxygen Prescription  None      Goals/Expected Outcomes   Short Term Goals  To learn and understand importance of monitoring SPO2 with pulse oximeter and demonstrate accurate use of the pulse oximeter.;To learn and understand importance of maintaining oxygen saturations>88%;To learn and demonstrate proper pursed lip breathing techniques or other breathing techniques.    Long  Term Goals  Verbalizes importance of monitoring SPO2 with pulse oximeter and return demonstration;Maintenance of O2 saturations>88%;Exhibits proper breathing techniques, such as pursed lip breathing or other method taught during program session    Comments  Patient is able to verbalize importance of monitoring his SPO2 with pulse oximeter and demonstrates proper pursed lip breathing technique during sessions.     Goals/Expected Outcomes  Patient will continue to meet his short and long term goals.        Initial Exercise Prescription: Initial Exercise Prescription - 03/20/18 1400      Date of Initial Exercise RX and Referring Provider   Date  03/20/18     Referring Provider  McQuaid    Expected Discharge Date  06/20/18      NuStep   Level  1    SPM  75    Minutes  17    METs  2.2      Recumbant Elliptical   Level  1    RPM  44    Watts  50    Minutes  17    METs  2.9      Prescription Details   Frequency (times per week)  2    Duration  Progress to 30 minutes of continuous aerobic without signs/symptoms of physical distress      Intensity   THRR 40-80% of  Max Heartrate  107-120-132    Ratings of Perceived Exertion  11-13    Perceived Dyspnea  0-4      Progression   Progression  Continue progressive overload as per policy without signs/symptoms or physical distress.      Resistance Training   Training Prescription  Yes    Weight  1    Reps  10-15       Perform Capillary Blood Glucose checks as needed.  Exercise Prescription Changes:  Exercise Prescription Changes    Row Name 04/13/18 0700 04/27/18 0800 05/11/18 1000 05/25/18 1500 06/08/18 0700     Response to Exercise   Blood Pressure (Admit)  112/60  112/64  118/64  112/62  112/60   Blood Pressure (Exercise)  136/62  122/64  128/66  116/70  122/60   Blood Pressure (Exit)  110/58  108/60  110/64  108/68  108/60   Heart Rate (Admit)  66 bpm  85 bpm  87 bpm  84 bpm  89 bpm   Heart Rate (Exercise)  97 bpm  94 bpm  110 bpm  87 bpm  88 bpm   Heart Rate (Exit)  88 bpm  82 bpm  97 bpm  79 bpm  81 bpm   Oxygen Saturation (Admit)  92 %  91 %  92 %  91 %  95 %   Oxygen Saturation (Exercise)  90 %  90 %  91 %  90 %  92 %   Oxygen Saturation (Exit)  92 %  90 %  91 %  93 %  92 %   Rating of Perceived Exertion (Exercise)  11  13  12  13  14    Perceived Dyspnea (Exercise)  11  13  12  12  12    Comments  First two weeks of exercise sessions  -  -  returned after being out for 2 weeks   -   Duration  Progress to 30 minutes of  aerobic without signs/symptoms of physical distress  Progress to 30 minutes of  aerobic without signs/symptoms of physical distress  Continue with 30 min of  aerobic exercise without signs/symptoms of physical distress.  Continue with 30 min of aerobic exercise without signs/symptoms of physical distress.  Continue with 30 min of aerobic exercise without signs/symptoms of physical distress.   Intensity  THRR New 107-120-132  THRR unchanged  THRR unchanged  THRR unchanged  THRR unchanged     Progression   Progression  Continue to progress workloads to maintain intensity without signs/symptoms of physical distress.  Continue to progress workloads to maintain intensity without signs/symptoms of physical distress.  Continue to progress workloads to maintain intensity without signs/symptoms of physical distress.  Continue to progress workloads to maintain intensity without signs/symptoms of physical distress.  Continue to progress workloads to maintain intensity without signs/symptoms of physical distress.   Average METs  2.05  2.65  2.5  2.3  2.1     Resistance Training   Training Prescription  Yes  Yes  Yes  Yes  Yes   Weight  2  3  3  3  3    Reps  10-15  10-15  10-15  10-15  10-15     NuStep   Level  1  2  2   - was told by Dr. not to use his legs  -   SPM  70  75  71  -  -   Minutes  22  22  22  -  -   METs  1.9  2.2  1.8  -  -     Arm Ergometer   Level  -  -  -  2  2   Watts  -  -  -  23  19   Minutes  -  -  -  17  17   METs  -  -  -  2.7  2.4     Arm/Foot Ergometer   Level  -  -  -  2  2.5   Watts  -  -  -  12  12   Minutes  -  -  -  22  22   METs  -  -  -  1.9  1.8     Recumbant Elliptical   Level  1  1  2   - was told by Dr. not to use his legs.   -   RPM  37  41  45  -  -   Watts  32  54  59  -  -   Minutes  17  17  17   -  -   METs  2.2  3.1  3.2  -  -     Home Exercise Plan   Plans to continue exercise at  -  -  Home (comment)  Home (comment)  Home (comment)   Frequency  -  -  Add 2 additional days to program exercise sessions.  Add 2 additional days to program exercise sessions.  Add 2 additional days to program exercise  sessions.   Initial Home Exercises Provided  -  -  03/20/18  03/20/18  03/20/18   Row Name 06/20/18 0700 07/04/18 1000           Response to Exercise   Blood Pressure (Admit)  114/66  120/70      Blood Pressure (Exercise)  126/60  120/64      Blood Pressure (Exit)  110/60  116/64      Heart Rate (Admit)  87 bpm  93 bpm      Heart Rate (Exercise)  98 bpm  95 bpm      Heart Rate (Exit)  95 bpm  94 bpm      Oxygen Saturation (Admit)  93 %  91 %      Oxygen Saturation (Exercise)  90 %  90 %      Oxygen Saturation (Exit)  90 %  90 %      Rating of Perceived Exertion (Exercise)  12  12      Perceived Dyspnea (Exercise)  12  12      Symptoms  pt. has been suffering from sciatica pain  -      Duration  Continue with 30 min of aerobic exercise without signs/symptoms of physical distress.  Continue with 30 min of aerobic exercise without signs/symptoms of physical distress.      Intensity  THRR unchanged  THRR unchanged        Progression   Progression  Continue to progress workloads to maintain intensity without signs/symptoms of physical distress.  Continue to progress workloads to maintain intensity without signs/symptoms of physical distress.      Average METs  2  2.3        Resistance Training   Training Prescription  Yes  Yes      Weight  3  4      Reps  10-15  10-15        Arm Ergometer   Level  2  2      Watts  17  18      Minutes  17  17      METs  2.1  2.5        Arm/Foot Ergometer   Level  3  3      Watts  15  16      Minutes  22  22      METs  1.9  2.1        Home Exercise Plan   Plans to continue exercise at  Home (comment)  Home (comment)      Frequency  Add 2 additional days to program exercise sessions.  Add 2 additional days to program exercise sessions.      Initial Home Exercises Provided  03/20/18  03/20/18         Exercise Comments:  Exercise Comments    Row Name 04/04/18 1023 04/27/18 0816 05/17/18 1459 06/13/18 0956 06/20/18 0759   Exercise Comments   Patient recently began program. He has finished one complete exercise session. We will monitor and progress as tolerated.   Pt. has been doing well in the program, he has attended 7 sessions. He has tolerated the exercise well. We will continue to monitor progress.   Pt. has attended 10 sessions so far. He continues to tolerated the exercise well and feels as if he is getting stronger and beginning to breath better at home.   Pt. has attended 15 sessions so far. He is still tolerating the exercise well but is only able to use his arms at this time. We will continue to monitor and progress as we can.   Pt. continues to only be able to use his arms, therefore he is working out on just he arm ergometer at this time.    [place] Name 07/11/18 1528           Exercise Comments  He has been released by his Dr. to use equipment that work his legs again so we reassessed him and started him back on the NuStep level 2. He tolerated that well today. We will monitor and progress as we see fit.           Exercise Goals and Review:  Exercise Goals    Row Name 03/20/18 1355             Exercise Goals   Increase Physical Activity  Yes       Intervention  Provide advice, education, support and counseling about physical activity/exercise needs.;Develop an individualized exercise prescription for aerobic and resistive training based on initial evaluation findings, risk stratification, comorbidities and participant's personal goals.       Expected Outcomes  Short Term: Attend rehab on a regular basis to increase amount of physical activity.       Increase Strength and Stamina  Yes       Intervention  Provide advice, education, support and counseling about physical activity/exercise needs.;Develop an individualized exercise prescription for aerobic and resistive training based on initial evaluation findings, risk stratification, comorbidities and participant's personal goals.       Expected Outcomes  Short Term:  Increase workloads from initial exercise prescription for resistance, speed, and METs.       Able to understand and use rate of perceived exertion (RPE) scale  Yes       Intervention  Provide education and explanation on  how to use RPE scale       Expected Outcomes  Short Term: Able to use RPE daily in rehab to express subjective intensity level;Long Term:  Able to use RPE to guide intensity level when exercising independently       Able to understand and use Dyspnea scale  Yes       Intervention  Provide education and explanation on how to use Dyspnea scale       Expected Outcomes  Short Term: Able to use Dyspnea scale daily in rehab to express subjective sense of shortness of breath during exertion;Long Term: Able to use Dyspnea scale to guide intensity level when exercising independently       Knowledge and understanding of Target Heart Rate Range (THRR)  Yes       Intervention  Provide education and explanation of THRR including how the numbers were predicted and where they are located for reference       Expected Outcomes  Short Term: Able to state/look up THRR;Long Term: Able to use THRR to govern intensity when exercising independently;Short Term: Able to use daily as guideline for intensity in rehab       Able to check pulse independently  Yes       Intervention  Provide education and demonstration on how to check pulse in carotid and radial arteries.;Review the importance of being able to check your own pulse for safety during independent exercise       Expected Outcomes  Short Term: Able to explain why pulse checking is important during independent exercise;Long Term: Able to check pulse independently and accurately       Understanding of Exercise Prescription  Yes       Intervention  Provide education, explanation, and written materials on patient's individual exercise prescription       Expected Outcomes  Short Term: Able to explain program exercise prescription;Long Term: Able to explain  home exercise prescription to exercise independently          Exercise Goals Re-Evaluation : Exercise Goals Re-Evaluation    Row Name 04/04/18 1022 04/27/18 0814 05/17/18 1457 06/13/18 0954 07/11/18 1526     Exercise Goal Re-Evaluation   Exercise Goals Review  Increase Strength and Stamina;Increase Physical Activity;Able to understand and use rate of perceived exertion (RPE) scale;Knowledge and understanding of Target Heart Rate Range (THRR);Understanding of Exercise Prescription;Able to check pulse independently;Able to understand and use Dyspnea scale  Increase Strength and Stamina;Increase Physical Activity;Able to understand and use rate of perceived exertion (RPE) scale;Knowledge and understanding of Target Heart Rate Range (THRR);Understanding of Exercise Prescription;Able to check pulse independently;Able to understand and use Dyspnea scale  Increase Strength and Stamina;Increase Physical Activity;Able to understand and use rate of perceived exertion (RPE) scale;Knowledge and understanding of Target Heart Rate Range (THRR);Understanding of Exercise Prescription;Able to check pulse independently;Able to understand and use Dyspnea scale  Increase Strength and Stamina;Increase Physical Activity;Able to understand and use rate of perceived exertion (RPE) scale;Knowledge and understanding of Target Heart Rate Range (THRR);Understanding of Exercise Prescription;Able to check pulse independently;Able to understand and use Dyspnea scale  Increase Strength and Stamina;Increase Physical Activity;Able to understand and use rate of perceived exertion (RPE) scale;Knowledge and understanding of Target Heart Rate Range (THRR);Understanding of Exercise Prescription;Able to check pulse independently;Able to understand and use Dyspnea scale   Comments  Patient has just recently began the program, he has had one full exercise visit. We will continue to monitor and progress as tolerated.   Pt.  has been doing well in  the program so far. He has tolerated the exercise well and has stated his breathing is already improving with ADLs.   Pt. continues to do well in PR. He has worked up to level 2 on both the NuStep and Recumbant Elliptical.   Pt. is on sessoin 16 of PR. He is doing well but has experienced some set back. He is only able to use his arms so we have switched both of his machines to the Arm Ergometer.   Pt. has continued to do well in the program despite his set backs and some what spotty attendance. He has been released by his Dr. to use equipment that work his legs again so we reassessed him and started him back on the NuStep.    Expected Outcomes  Increase activity level.   Increase activity level.   Increase activity level.   Increase activity level at home.   Increase activity level at home.       Discharge Exercise Prescription (Final Exercise Prescription Changes): Exercise Prescription Changes - 07/04/18 1000      Response to Exercise   Blood Pressure (Admit)  120/70    Blood Pressure (Exercise)  120/64    Blood Pressure (Exit)  116/64    Heart Rate (Admit)  93 bpm    Heart Rate (Exercise)  95 bpm    Heart Rate (Exit)  94 bpm    Oxygen Saturation (Admit)  91 %    Oxygen Saturation (Exercise)  90 %    Oxygen Saturation (Exit)  90 %    Rating of Perceived Exertion (Exercise)  12    Perceived Dyspnea (Exercise)  12    Duration  Continue with 30 min of aerobic exercise without signs/symptoms of physical distress.    Intensity  THRR unchanged      Progression   Progression  Continue to progress workloads to maintain intensity without signs/symptoms of physical distress.    Average METs  2.3      Resistance Training   Training Prescription  Yes    Weight  4    Reps  10-15      Arm Ergometer   Level  2    Watts  18    Minutes  17    METs  2.5      Arm/Foot Ergometer   Level  3    Watts  16    Minutes  22    METs  2.1      Home Exercise Plan   Plans to continue exercise at   Home (comment)    Frequency  Add 2 additional days to program exercise sessions.    Initial Home Exercises Provided  03/20/18       Nutrition:  Target Goals: Understanding of nutrition guidelines, daily intake of sodium 1500mg , cholesterol 200mg , calories 30% from fat and 7% or less from saturated fats, daily to have 5 or more servings of fruits and vegetables.  Biometrics: Pre Biometrics - 03/20/18 1355      Pre Biometrics   Height  5\' 11"  (1.803 m)    Waist Circumference  44 inches    Hip Circumference  43.5 inches    Waist to Hip Ratio  1.01 %    Triceps Skinfold  10 mm    % Body Fat  28.6 %    Grip Strength  18.7 kg    Single Leg Stand  1.5 seconds  Nutrition Therapy Plan and Nutrition Goals: Nutrition Therapy & Goals - 07/13/18 0747      Nutrition Therapy   RD appointment deferred  Yes      Personal Nutrition Goals   Comments  Patient reminded about RD meeting today at [place]. He continues to feel he is meeting his nutrition goals of less carbohydrates and sweets. Will continue to monitor for progress.       Intervention Plan   Intervention  Nutrition handout(s) given to patient.       Nutrition Assessments: Nutrition Assessments - 03/20/18 1459      MEDFICTS Scores   Pre Score  67       Nutrition Goals Re-Evaluation:   Nutrition Goals Discharge (Final Nutrition Goals Re-Evaluation):   Psychosocial: Target Goals: Acknowledge presence or absence of significant depression and/or stress, maximize coping skills, provide positive support system. Participant is able to verbalize types and ability to use techniques and skills needed for reducing stress and depression.  Initial Review & Psychosocial Screening: Initial Psych Review & Screening - 03/20/18 1456      Initial Review   Current issues with  None Identified      Family Dynamics   Good Support System?  Yes    Concerns  Recent loss of child   Patient seems to be coping with this tragedy.      Barriers   Psychosocial barriers to participate in program  There are no identifiable barriers or psychosocial needs.      Screening Interventions   Interventions  Encouraged to exercise    Expected Outcomes  Short Term goal: Identification and review with participant of any Quality of Life or Depression concerns found by scoring the questionnaire.;Long Term goal: The participant improves quality of Life and PHQ9 Scores as seen by post scores and/or verbalization of changes       Quality of Life Scores: Quality of Life - 03/20/18 1356      Quality of Life   Select  Quality of Life      Quality of Life Scores   Health/Function Pre  23.96 %    Socioeconomic Pre  25.5 %    Psych/Spiritual Pre  25.6 %    Family Pre  30 %    GLOBAL Pre  25.23 %      Scores of 19 and below usually indicate a poorer quality of life in these areas.  A difference of  2-3 points is a clinically meaningful difference.  A difference of 2-3 points in the total score of the Quality of Life Index has been associated with significant improvement in overall quality of life, self-image, physical symptoms, and general health in studies assessing change in quality of life.   PHQ-9: Recent Review Flowsheet Data    Depression screen [facility] 2/9 03/20/2018 11/26/2015   Decreased Interest 0 0   Down, Depressed, Hopeless 0 0   PHQ - 2 Score 0 0   Altered sleeping 0 0   Tired, decreased energy 0 1   Change in appetite 0 1   Feeling bad or failure about yourself  1 -   Trouble concentrating 0 0   Moving slowly or fidgety/restless 0 0   Suicidal thoughts 0 0   PHQ-9 Score 1 2   Difficult doing work/chores Not difficult at all Not difficult at all     Interpretation of Total Score  Total Score Depression Severity:  1-4 = Minimal depression, 5-9 = Mild depression, 10-14 = Moderate depression,  15-19 = Moderately severe depression, 20-27 = Severe depression   Psychosocial Evaluation and Intervention: Psychosocial  Evaluation - 03/20/18 1458      Psychosocial Evaluation & Interventions   Interventions  Encouraged to exercise with the program and follow exercise prescription    Continue Psychosocial Services   No Follow up required       Psychosocial Re-Evaluation: Psychosocial Re-Evaluation    [place] Name 04/27/18 1530 05/18/18 1509 06/15/18 1243 07/13/18 0750       Psychosocial Re-Evaluation   Current issues with  None Identified  None Identified  None Identified  None Identified    Comments  Patient's QOL score was 25.43 and PHQ-9 score was 2. He has no psychosocial issues identified.   Patient's QOL score was 25.43 and PHQ-9 score was 2. He has no psychosocial issues identified.   Patient's QOL score was 25.43 and PHQ-9 score was 2. He has no psychosocial issues identified.   Patient's QOL score was 25.43 and PHQ-9 score was 2. He has no psychosocial issues identified.     Expected Outcomes  Patient will have no psychosocial issues identified at discharge.   Patient will have no psychosocial issues identified at discharge.   Patient will have no psychosocial issues identified at discharge.   Patient will have no psychosocial issues identified at discharge.     Interventions  Stress management education;Relaxation education;Encouraged to attend Pulmonary Rehabilitation for the exercise  Stress management education;Relaxation education;Encouraged to attend Pulmonary Rehabilitation for the exercise  Stress management education;Relaxation education;Encouraged to attend Pulmonary Rehabilitation for the exercise  Stress management education;Relaxation education;Encouraged to attend Pulmonary Rehabilitation for the exercise    Continue Psychosocial Services   No Follow up required  No Follow up required  No Follow up required  No Follow up required       Psychosocial Discharge (Final Psychosocial Re-Evaluation): Psychosocial Re-Evaluation - 07/13/18 0750      Psychosocial Re-Evaluation   Current issues with   None Identified    Comments  Patient's QOL score was 25.43 and PHQ-9 score was 2. He has no psychosocial issues identified.     Expected Outcomes  Patient will have no psychosocial issues identified at discharge.     Interventions  Stress management education;Relaxation education;Encouraged to attend Pulmonary Rehabilitation for the exercise    Continue Psychosocial Services   No Follow up required        Education: Education Goals: Education classes will be provided on a weekly basis, covering required topics. Participant will state understanding/return demonstration of topics presented.  Learning Barriers/Preferences: Learning Barriers/Preferences - 03/20/18 1447      Learning Barriers/Preferences   Learning Barriers  None    Learning Preferences  Individual Instruction;Group Instruction;Skilled Demonstration       Education Topics: How Lungs Work and Diseases: - Discuss the anatomy of the lungs and diseases that can affect the lungs, such as COPD.   Exercise: -Discuss the importance of exercise, FITT principles of exercise, normal and abnormal responses to exercise, and how to exercise safely.   Environmental Irritants: -Discuss types of environmental irritants and how to limit exposure to environmental irritants.   Meds/Inhalers and oxygen: - Discuss respiratory medications, definition of an inhaler and oxygen, and the proper way to use an inhaler and oxygen.   PULMONARY REHAB OTHER RESPIRATORY from 07/06/2018 in [place]  Date  06/29/18  Educator  Wynetta Emery      Energy Saving Techniques: - Discuss methods to conserve energy and decrease shortness  of breath when performing activities of daily living.    PULMONARY REHAB OTHER RESPIRATORY from 07/06/2018 in [place]  Date  07/06/18  Educator  Wynetta Emery  Instruction Review Code  2- Demonstrated Understanding      Bronchial Hygiene / Breathing Techniques: - Discuss  breathing mechanics, pursed-lip breathing technique,  proper posture, effective ways to clear airways, and other functional breathing techniques   PULMONARY REHAB OTHER RESPIRATORY from 07/06/2018 in [place]  Date  04/13/18  Educator  Wynetta Emery  Instruction Review Code  2- Demonstrated Understanding      Cleaning Equipment: - Provides group verbal and written instruction about the health risks of elevated stress, cause of high stress, and healthy ways to reduce stress.   Nutrition I: Fats: - Discuss the types of cholesterol, what cholesterol does to the body, and how cholesterol levels can be controlled.   PULMONARY REHAB OTHER RESPIRATORY from 07/06/2018 in [place]  Date  04/27/18  Educator  Wynetta Emery  Instruction Review Code  2- Demonstrated Understanding      Nutrition II: Labels: -Discuss the different components of food labels and how to read food labels.   PULMONARY REHAB OTHER RESPIRATORY from 07/06/2018 in [place]  Date  05/04/18  Educator  D. Coad  Instruction Review Code  2- Demonstrated Understanding      Respiratory Infections: - Discuss the signs and symptoms of respiratory infections, ways to prevent respiratory infections, and the importance of seeking medical treatment when having a respiratory infection.   Stress I: Signs and Symptoms: - Discuss the causes of stress, how stress may lead to anxiety and depression, and ways to limit stress.   PULMONARY REHAB OTHER RESPIRATORY from 07/06/2018 in [place]  Date  05/25/18  Educator  MB  Instruction Review Code  2- Demonstrated Understanding      Stress II: Relaxation: -Discuss relaxation techniques to limit stress.   PULMONARY REHAB OTHER RESPIRATORY from 07/06/2018 in [place]  Date  06/01/18  Educator  Wynetta Emery  Instruction Review Code  2- Demonstrated Understanding      Oxygen for  Home/Travel: - Discuss how to prepare for travel when on oxygen and proper ways to transport and store oxygen to ensure safety.   PULMONARY REHAB OTHER RESPIRATORY from 07/06/2018 in [place]  Date  06/08/18  Educator  DJ  Instruction Review Code  2- Demonstrated Understanding      Knowledge Questionnaire Score: Knowledge Questionnaire Score - 03/20/18 1448      Knowledge Questionnaire Score   Pre Score  15/17       Core Components/Risk Factors/Patient Goals at Admission: Personal Goals and Risk Factors at Admission - 03/20/18 1459      Core Components/Risk Factors/Patient Goals on Admission    Weight Management  Weight Maintenance    Personal Goal Other  Yes    Personal Goal  Increase his activity    Intervention  Come to [facility] 3 days/week and supplement with 2 more days at home.     Expected Outcomes  To reach personal goals.        Core Components/Risk Factors/Patient Goals Review:  Goals and Risk Factor Review    Row Name 04/27/18 1528 05/18/18 1507 06/15/18 1241 07/13/18 0748       Core Components/Risk Factors/Patient Goals Review   Personal Goals Review  Weight Management/Obesity;Improve shortness of breath with ADL's Gain strength; increase activities.   Weight  Management/Obesity;Improve shortness of breath with ADL's Gain strength; increase activities.   Weight Management/Obesity;Improve shortness of breath with ADL's Gain strength; increase activities.   Weight Management/Obesity;Improve shortness of breath with ADL's Gain strength; increase activities.     Review  Patient has completed 8 sessions gaining 8 lbs since he started the program. He admitts to needing to work on his diet and blames it on the holiday season. He is doing well in the program with progression. He says he is feeling somewhat stronger and hopes to continue to improve as the continues to attend. Will continue to monitor for progress.   Patient has completed 10 session losing 4 lbs  since his last 30 day review. He is doing well in the program with progression but has been out for the past week with URI. He says this has helped him get moving and start an exercise program he hopes to continue. Will continue to monitor for progress.   Patient has completed 16 sessions gaining 1 lb since last 30 day review. He continues to do well in the program with progression. He states he is breathing better and is getting stronger. Will continue to monitor for progress.   Patient has completed 21 sessions losing 1 lb since last 30 day review. He continues to do well in the program with progression. Patient states he has increased some of his activities but he wants to do more. He is not going to do Physical therapy now for siatic nerve problems because his pain has improved. He is pleased with his progress in the program. Will continue to monitor for progress.     Expected Outcomes  Patient will continue to attend sessions and complete the program meeting his personal goals.   Patient will continue to attend sessions and complete the program meeting his personal goals.   Patient will continue to attend sessions and complete the program meeting his personal goals.   Patient will continue to attend sessions and complete the program meeting his personal goals.        Core Components/Risk Factors/Patient Goals at Discharge (Final Review):  Goals and Risk Factor Review - 07/13/18 0748      Core Components/Risk Factors/Patient Goals Review   Personal Goals Review  Weight Management/Obesity;Improve shortness of breath with ADL's   Gain strength; increase activities.    Review  Patient has completed 21 sessions losing 1 lb since last 30 day review. He continues to do well in the program with progression. Patient states he has increased some of his activities but he wants to do more. He is not going to do Physical therapy now for siatic nerve problems because his pain has improved. He is pleased with his  progress in the program. Will continue to monitor for progress.     Expected Outcomes  Patient will continue to attend sessions and complete the program meeting his personal goals.        ITP Comments: ITP Comments    Row Name 03/20/18 1512 04/10/18 1415         ITP Comments  Patient has been in our program before with his heart. He is returning with emphysema. He is now retired and plans to complete the program this time.   Patient is new to program. He has completed 3 sessions. Will continue to monitor for progress.          Comments: ITP REVIEW Pt is making expected progress toward pulmonary rehab goals after completing 21 sessions. Recommend continued  exercise, life style modification, education, and utilization of breathing techniques to increase stamina and strength and decrease shortness of breath with exertion.

## 2018-07-18 ENCOUNTER — Encounter (HOSPITAL_COMMUNITY)
Admission: RE | Admit: 2018-07-18 | Discharge: 2018-07-18 | Disposition: A | Discharge status: Home / Self Care | Payer: Medicare Other | Source: Ambulatory Visit | Attending: Pulmonary Disease | Admitting: Pulmonary Disease

## 2018-07-18 DIAGNOSIS — J432 Centrilobular emphysema: Secondary | ICD-10-CM

## 2018-07-18 NOTE — Progress Notes (Signed)
Daily Session Note  Patient Details  Name: JAMARR TREINEN MRN: [id number] Date of Birth: [birth year] Referring Provider:     PULMONARY REHAB OTHER RESP ORIENTATION from 03/20/2018 in [place]  Referring Provider  [place]      Encounter Date: 07/18/2018  Check In: Session Check In - 07/18/18 1109      Check-In   Supervising physician immediately available to respond to emergencies  See telemetry face sheet for immediately available MD    Location  [facility]    Staff Present  Russella Dar, MS, EP, [facility], Exercise Physiologist;Arsenia Goracke Zachery Conch, Exercise Physiologist    Medication changes reported      No    Fall or balance concerns reported     No    Warm-up and Cool-down  Performed as group-led instruction    Resistance Training Performed  Yes    VAD Patient?  No    PAD/SET Patient?  No      Pain Assessment   Currently in Pain?  No/denies    Pain Score  0-No pain    Multiple Pain Sites  No       Capillary Blood Glucose: No results found for this or any previous visit (from the past 24 hour(s)).    Social History   Tobacco Use  Smoking Status Former Smoker  . Packs/day: 1.00  . Years: 28.00  . Pack years: 28.00  . Types: Cigarettes  . Start date: 10/21/1956  . Last attempt to quit: 05/10/1985  . Years since quitting: 33.2  Smokeless Tobacco Never Used    Goals Met:  Proper associated with RPD/PD & O2 Sat Independence with exercise equipment Improved SOB with ADL's Exercise tolerated well No report of cardiac concerns or symptoms Strength training completed today  Goals Unmet:  Not Applicable  Comments: Pt able to follow exercise prescription today without complaint.  Will continue to monitor for progression. Check out 1145.   Dr. Sinda Du is Medical Director for [facility].

## 2018-07-20 ENCOUNTER — Encounter (HOSPITAL_COMMUNITY): Payer: Medicare Other

## 2018-07-24 ENCOUNTER — Other Ambulatory Visit: Payer: Self-pay

## 2018-07-24 ENCOUNTER — Ambulatory Visit (INDEPENDENT_AMBULATORY_CARE_PROVIDER_SITE_OTHER): Payer: Medicare Other | Admitting: *Deleted

## 2018-07-24 DIAGNOSIS — R001 Bradycardia, unspecified: Secondary | ICD-10-CM

## 2018-07-24 DIAGNOSIS — I442 Atrioventricular block, complete: Secondary | ICD-10-CM | POA: Diagnosis not present

## 2018-07-25 ENCOUNTER — Ambulatory Visit (INDEPENDENT_AMBULATORY_CARE_PROVIDER_SITE_OTHER): Payer: Medicare Other | Admitting: *Deleted

## 2018-07-25 ENCOUNTER — Encounter (HOSPITAL_COMMUNITY): Payer: Medicare Other

## 2018-07-25 ENCOUNTER — Other Ambulatory Visit: Payer: Self-pay

## 2018-07-25 DIAGNOSIS — Z5181 Encounter for therapeutic drug level monitoring: Secondary | ICD-10-CM

## 2018-07-25 DIAGNOSIS — I4891 Unspecified atrial fibrillation: Secondary | ICD-10-CM

## 2018-07-25 DIAGNOSIS — I4892 Unspecified atrial flutter: Secondary | ICD-10-CM

## 2018-07-25 LAB — POCT INR: INR: 2.7 (ref 2.0–3.0)

## 2018-07-25 NOTE — Patient Instructions (Signed)
Continue 1 tablet daily except 1 1/2 tablets on Tuesdays, Thursdays and Saturdays Recheck in 6 weeks 

## 2018-07-27 ENCOUNTER — Encounter (HOSPITAL_COMMUNITY): Payer: Medicare Other

## 2018-07-27 LAB — CUP PACEART REMOTE DEVICE CHECK
Date Time Interrogation Session: 20200319101440
Implantable Lead Implant Date: 20170612
Implantable Lead Location: 753859
Implantable Lead Location: 753860
Implantable Lead Model: 7741
Implantable Lead Model: 7742
Implantable Lead Serial Number: 733074
Implantable Lead Serial Number: 760165
Implantable Pulse Generator Implant Date: 20170612
MDC IDC LEAD IMPLANT DT: 20170612
Pulse Gen Serial Number: 724121

## 2018-07-31 NOTE — Progress Notes (Signed)
Pulmonary Individual Treatment Plan  Patient Details  Name: Christian Rasmussen MRN: [id number] Date of Birth: 04/21/1941 Referring Provider:     PULMONARY REHAB OTHER RESP ORIENTATION from 03/20/2018 in [place]  Referring Provider  McQuaid      Initial Encounter Date:    PULMONARY REHAB OTHER RESP ORIENTATION from 03/20/2018 in [place]  Date  03/20/18      Visit Diagnosis: Centrilobular emphysema ([place])  Patient's Home Medications on Admission:   Current Outpatient Medications:  .  amLODipine (NORVASC) 5 MG tablet, Take 1 tablet (5 mg total) by mouth daily., Disp: 90 tablet, Rfl: 3 .  amoxicillin (AMOXIL) 500 MG capsule, Take 2,000 mg by mouth See admin instructions. Take 4 capsules (2000 mg) by for dental cleaning, Disp: , Rfl:  .  aspirin EC 81 MG tablet, Take 1 tablet (81 mg total) by mouth daily. (Patient taking differently: Take 81 mg by mouth every evening. ), Disp: 90 tablet, Rfl: 3 .  ASTAXANTHIN PO, Take 10 mg by mouth daily. , Disp: , Rfl:  .  atorvastatin (LIPITOR) 40 MG tablet, Take 40 mg by mouth daily. , Disp: , Rfl:  .  carvedilol (COREG) 12.5 MG tablet, Take 1.5 tablets (18.75 mg total) by mouth 2 (two) times daily., Disp: 270 tablet, Rfl: 3 .  Coenzyme Q10 200 MG capsule, Take 200 mg by mouth daily. , Disp: , Rfl:  .  furosemide (LASIX) 40 MG tablet, Take 40 mg by mouth See admin instructions. Take 1 tablet (40 mg) by mouth scheduled in the morning, may take an additional dose in the afternoon if needed for ankle swelling., Disp: , Rfl:  .  lisinopril (PRINIVIL,ZESTRIL) 5 MG tablet, Take 1 tablet (5 mg total) by mouth daily. (Patient taking differently: Take 20 mg by mouth daily. ), Disp: 90 tablet, Rfl: 3 .  metFORMIN (GLUCOPHAGE) 500 MG tablet, Take 1,000 mg by mouth 2 (two) times daily with a meal. , Disp: , Rfl: 2 .  potassium chloride (KLOR-CON 10) 10 MEQ tablet, Take 1 tablet (10 mEq total) by mouth 2 (two) times  daily., Disp: 180 tablet, Rfl: 3 .  SF 5000 PLUS 1.1 % CREA dental cream, Apply 1 application topically See admin instructions. Use 1 application 2 times a week at bedtime., Disp: , Rfl:  .  warfarin (COUMADIN) 5 MG tablet, Take 1 1/2 tablets daily except 1 tablet on Mondays and Fridays (Patient taking differently: Take 2.5-5 mg by mouth See admin instructions. Take 0.5 tablet (2.5 mg) by mouth on Mondays, Wednesdays, & Fridays.  Take 1 tablet (5 mg) by mouth on Tuesdays, Thursdays, Saturdays, & Sundays.), Disp: 135 tablet, Rfl: 3  Past Medical History: Past Medical History:  Diagnosis Date  . 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 Aortic stenosis 10/14/2015  . CAD (coronary artery disease)    status post Taxus stent patency of RCA 2004, Cardiolite negative for ischemia in 2010.  . Diabetes mellitus ([place])   . Dyslipidemia   . History of kidney stones   . Hypertension   . Iron deficiency anemia 02/02/2018  . Mitral regurgitation 10/15/2015   Moderate-severe by intra-operative TEE  . Overweight   . Right bundle branch block (RBBB) with left anterior hemiblock   . 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 Snores   . Typical atrial flutter [facility])    s/p ablation 11-23-2012 by Dr Rayann Heman    Tobacco Use: Social History   Tobacco Use  Smoking Status Former Smoker  . Packs/day: 1.00  . Years: 28.00  . Pack years: 28.00  . Types: Cigarettes  . Start date: 10/21/1956  . Last attempt to quit: 05/10/1985  . Years since quitting: 33.2  Smokeless Tobacco Never Used    Labs: Recent Review Flowsheet Data    Labs for ITP Cardiac and Pulmonary Rehab Latest Ref Rng &  Units 10/16/2015 10/16/2015 10/16/2015 10/16/2015 10/18/2015   Cholestrol 0 - 200 mg/dL - - - - -   LDLCALC 0 - 99 mg/dL - - - - -   HDL >40 mg/dL - - - - -   Trlycerides <150 mg/dL - - - - -   Hemoglobin A1c 4.8 - 5.6 % - - - - -   PHART 7.350 - 7.450 - 7.394 7.415 - -   PCO2ART 35.0 - 45.0 mmHg - 35.5 36.2 - -   HCO3 20.0 - 24.0 mEq/L - 21.6 23.2 - -   TCO2 0 - 100 mmol/L 22 23 24 20 26    ACIDBASEDEF 0.0 - 2.0 mmol/L - 3.0(H) 1.0 - -   O2SAT % - 96.0 97.0 - -      Capillary Blood Glucose: Lab Results  Component Value Date   GLUCAP 122 (H) 09/16/2017   GLUCAP 168 (H) 03/20/2016   GLUCAP 94 03/19/2016   GLUCAP 114 (H) 03/19/2016   GLUCAP 96 03/19/2016     Pulmonary Assessment Scores: Pulmonary Assessment Scores    Row Name 03/20/18 1518         ADL UCSD   ADL Phase  Entry     SOB Score total  45     Rest  0     Walk  9     Stairs  2     Bath  1     Dress  1     Shop  0       CAT Score   CAT Score  16       mMRC Score   mMRC Score  3        Pulmonary Function Assessment: Pulmonary Function Assessment - 03/20/18 1515      Pulmonary Function Tests   FVC%  54 %    FEV1%  54 %    FEV1/FVC Ratio  73    RV%  4.37 %    DLCO%  13.97 %      Initial Spirometry Results   FVC%  57 %    FEV1%  57 %    FEV1/FVC Ratio  73      Post Bronchodilator Spirometry Results   FVC%  57 %    FEV1%  57 %    FEV1/FVC Ratio  73      Breath   Bilateral Breath Sounds  Clear    Shortness of Breath  Yes;Limiting activity       Exercise Target Goals: Exercise Program Goal: Individual exercise prescription set using results from initial 6 min walk test and THRR while considering  patient's activity barriers and safety.   Exercise Prescription Goal: Initial exercise prescription builds to 30-45 minutes a day of aerobic activity, 2-3 days per week.  Home exercise guidelines will be given to patient during program as part of exercise prescription that the participant will  acknowledge.  Activity Barriers & Risk Stratification: Activity Barriers & Cardiac Risk Stratification - 03/20/18 1514      Activity Barriers & Cardiac Risk Stratification   Cardiac Risk Stratification  High       6 Minute Walk: 6 Minute Walk    Row Name 03/20/18 1353         6 Minute Walk   Phase  Initial     Distance  800 feet     Walk Time  5.41 minutes     # of Rest Breaks  1     MPH  1.51     METS  2.16     RPE  13     Perceived Dyspnea   13     VO2 Peak  6.65     Symptoms  No     Resting HR  83 bpm     Resting BP  108/60     Resting Oxygen Saturation   94 %     Exercise Oxygen Saturation  during 6 min walk  89 %     Max Ex. HR  115 bpm     Max Ex. BP  144/72     2 Minute Post BP  122/70        Oxygen Initial Assessment: Oxygen Initial Assessment - 03/20/18 1514      Home Oxygen   Home Oxygen Device  None       Oxygen Re-Evaluation: Oxygen Re-Evaluation    Row Name 04/27/18 1525 05/18/18 1510 06/15/18 1250 07/13/18 0746 07/31/18 1302     Program Oxygen Prescription   Program Oxygen Prescription  None  None  None  None  None     Home Oxygen   Home Oxygen Device  Liquid Oxygen  None  None  Liquid Oxygen  None   Sleep Oxygen Prescription  None  None  None  None  None   Home Exercise Oxygen Prescription  None  None  None  None  None   Home at Rest Exercise Oxygen Prescription  None  None  None  None  None   Compliance with Home Oxygen Use  -  -  -  -  - NA     Goals/Expected Outcomes   Short Term Goals  To learn and understand importance of monitoring SPO2 with pulse oximeter and demonstrate accurate use of the pulse oximeter.;To learn and understand importance of maintaining oxygen saturations>88%;To learn and demonstrate proper pursed lip breathing techniques or other breathing techniques.  To learn and understand importance of monitoring SPO2 with pulse oximeter and demonstrate accurate use of the pulse oximeter.;To learn and understand importance of  maintaining oxygen saturations>88%;To learn and demonstrate proper pursed lip breathing techniques or other breathing techniques.  To learn and understand importance of monitoring SPO2 with pulse oximeter and demonstrate accurate use of the pulse oximeter.;To learn and understand importance of maintaining oxygen saturations>88%;To learn and demonstrate proper pursed lip breathing techniques or other breathing techniques.  To learn and understand importance of monitoring SPO2 with pulse oximeter and demonstrate accurate use of the pulse oximeter.;To learn and understand importance of maintaining oxygen saturations>88%;To learn and demonstrate proper pursed lip breathing techniques or other breathing techniques.  To learn and understand importance of monitoring SPO2 with pulse oximeter and demonstrate accurate use of the pulse oximeter.;To learn and understand importance of maintaining oxygen saturations>88%;To  learn and demonstrate proper pursed lip breathing techniques or other breathing techniques.   Long  Term Goals  Verbalizes importance of monitoring SPO2 with pulse oximeter and return demonstration;Maintenance of O2 saturations>88%;Exhibits proper breathing techniques, such as pursed lip breathing or other method taught during program session  Verbalizes importance of monitoring SPO2 with pulse oximeter and return demonstration;Maintenance of O2 saturations>88%;Exhibits proper breathing techniques, such as pursed lip breathing or other method taught during program session  Verbalizes importance of monitoring SPO2 with pulse oximeter and return demonstration;Maintenance of O2 saturations>88%;Exhibits proper breathing techniques, such as pursed lip breathing or other method taught during program session  Verbalizes importance of monitoring SPO2 with pulse oximeter and return demonstration;Maintenance of O2 saturations>88%;Exhibits proper breathing techniques, such as pursed lip breathing or other method  taught during program session  Verbalizes importance of monitoring SPO2 with pulse oximeter and return demonstration;Maintenance of O2 saturations>88%;Exhibits proper breathing techniques, such as pursed lip breathing or other method taught during program session   Comments  Patient is able to verbalize importance of monitoring his SPO2 with pulse oximeter and demonstrates proper pursed lip breathing technique during sessions.   Patient is able to verbalize importance of monitoring his SPO2 with pulse oximeter and demonstrates proper pursed lip breathing technique during sessions.   Patient is able to verbalize importance of monitoring his SPO2 with pulse oximeter and demonstrates proper pursed lip breathing technique during sessions.   Patient is able to verbalize importance of monitoring his SPO2 with pulse oximeter and demonstrates proper pursed lip breathing technique during sessions.   Patient is able to verbalize importance of monitoring his SPO2 with pulse oximeter and demonstrates proper pursed lip breathing technique during sessions.    Goals/Expected Outcomes  Patient will continue to meet his short and long term goals.   Patient will continue to meet his short and long term goals.   Patient will continue to meet his short and long term goals.   Patient will continue to meet his short and long term goals.   Patient will continue to meet his short and long term goals.       Oxygen Discharge (Final Oxygen Re-Evaluation): Oxygen Re-Evaluation - 07/31/18 1302      Program Oxygen Prescription   Program Oxygen Prescription  None      Home Oxygen   Home Oxygen Device  None    Sleep Oxygen Prescription  None    Home Exercise Oxygen Prescription  None    Home at Rest Exercise Oxygen Prescription  None    Compliance with Home Oxygen Use  --   NA     Goals/Expected Outcomes   Short Term Goals  To learn and understand importance of monitoring SPO2 with pulse oximeter and demonstrate accurate use  of the pulse oximeter.;To learn and understand importance of maintaining oxygen saturations>88%;To learn and demonstrate proper pursed lip breathing techniques or other breathing techniques.    Long  Term Goals  Verbalizes importance of monitoring SPO2 with pulse oximeter and return demonstration;Maintenance of O2 saturations>88%;Exhibits proper breathing techniques, such as pursed lip breathing or other method taught during program session    Comments  Patient is able to verbalize importance of monitoring his SPO2 with pulse oximeter and demonstrates proper pursed lip breathing technique during sessions.     Goals/Expected Outcomes  Patient will continue to meet his short and long term goals.        Initial Exercise Prescription: Initial Exercise Prescription - 03/20/18 1400  Date of Initial Exercise RX and Referring Provider   Date  03/20/18    Referring Provider  McQuaid    Expected Discharge Date  06/20/18      NuStep   Level  1    SPM  75    Minutes  17    METs  2.2      Recumbant Elliptical   Level  1    RPM  44    Watts  50    Minutes  17    METs  2.9      Prescription Details   Frequency (times per week)  2    Duration  Progress to 30 minutes of continuous aerobic without signs/symptoms of physical distress      Intensity   THRR 40-80% of Max Heartrate  107-120-132    Ratings of Perceived Exertion  11-13    Perceived Dyspnea  0-4      Progression   Progression  Continue progressive overload as per policy without signs/symptoms or physical distress.      Resistance Training   Training Prescription  Yes    Weight  1    Reps  10-15       Perform Capillary Blood Glucose checks as needed.  Exercise Prescription Changes:  Exercise Prescription Changes    Row Name 04/13/18 0700 04/27/18 0800 05/11/18 1000 05/25/18 1500 06/08/18 0700     Response to Exercise   Blood Pressure (Admit)  112/60  112/64  118/64  112/62  112/60   Blood Pressure (Exercise)   136/62  122/64  128/66  116/70  122/60   Blood Pressure (Exit)  110/58  108/60  110/64  108/68  108/60   Heart Rate (Admit)  66 bpm  85 bpm  87 bpm  84 bpm  89 bpm   Heart Rate (Exercise)  97 bpm  94 bpm  110 bpm  87 bpm  88 bpm   Heart Rate (Exit)  88 bpm  82 bpm  97 bpm  79 bpm  81 bpm   Oxygen Saturation (Admit)  92 %  91 %  92 %  91 %  95 %   Oxygen Saturation (Exercise)  90 %  90 %  91 %  90 %  92 %   Oxygen Saturation (Exit)  92 %  90 %  91 %  93 %  92 %   Rating of Perceived Exertion (Exercise)  11  13  12  13  14    Perceived Dyspnea (Exercise)  11  13  12  12  12    Comments  First two weeks of exercise sessions  -  -  returned after being out for 2 weeks   -   Duration  Progress to 30 minutes of  aerobic without signs/symptoms of physical distress  Progress to 30 minutes of  aerobic without signs/symptoms of physical distress  Continue with 30 min of aerobic exercise without signs/symptoms of physical distress.  Continue with 30 min of aerobic exercise without signs/symptoms of physical distress.  Continue with 30 min of aerobic exercise without signs/symptoms of physical distress.   Intensity  THRR New 107-120-132  THRR unchanged  THRR unchanged  THRR unchanged  THRR unchanged     Progression   Progression  Continue to progress workloads to maintain intensity without signs/symptoms of physical distress.  Continue to progress workloads to maintain intensity without signs/symptoms of physical distress.  Continue to progress workloads to maintain intensity without signs/symptoms of  physical distress.  Continue to progress workloads to maintain intensity without signs/symptoms of physical distress.  Continue to progress workloads to maintain intensity without signs/symptoms of physical distress.   Average METs  2.05  2.65  2.5  2.3  2.1     Resistance Training   Training Prescription  Yes  Yes  Yes  Yes  Yes   Weight  2  3  3  3  3    Reps  10-15  10-15  10-15  10-15  10-15     NuStep    Level  1  2  2   - was told by Dr. not to use his legs  -   SPM  70  75  71  -  -   Minutes  22  22  22   -  -   METs  1.9  2.2  1.8  -  -     Arm Ergometer   Level  -  -  -  2  2   Watts  -  -  -  23  19   Minutes  -  -  -  17  17   METs  -  -  -  2.7  2.4     Arm/Foot Ergometer   Level  -  -  -  2  2.5   Watts  -  -  -  12  12   Minutes  -  -  -  22  22   METs  -  -  -  1.9  1.8     Recumbant Elliptical   Level  1  1  2   - was told by Dr. not to use his legs.   -   RPM  37  41  45  -  -   Watts  32  54  59  -  -   Minutes  17  17  17   -  -   METs  2.2  3.1  3.2  -  -     Home Exercise Plan   Plans to continue exercise at  -  -  Home (comment)  Home (comment)  Home (comment)   Frequency  -  -  Add 2 additional days to program exercise sessions.  Add 2 additional days to program exercise sessions.  Add 2 additional days to program exercise sessions.   Initial Home Exercises Provided  -  -  03/20/18  03/20/18  03/20/18   Row Name 06/20/18 0700 07/04/18 1000 07/18/18 0800 07/31/18 1000       Response to Exercise   Blood Pressure (Admit)  114/66  120/70  108/60  118/66    Blood Pressure (Exercise)  126/60  120/64  118/58  128/60    Blood Pressure (Exit)  110/60  116/64  96/58  122/60    Heart Rate (Admit)  87 bpm  93 bpm  77 bpm  78 bpm    Heart Rate (Exercise)  98 bpm  95 bpm  80 bpm  62 bpm    Heart Rate (Exit)  95 bpm  94 bpm  70 bpm  76 bpm    Oxygen Saturation (Admit)  93 %  91 %  91 %  92 %    Oxygen Saturation (Exercise)  90 %  90 %  92 %  91 %    Oxygen Saturation (Exit)  90 %  90 %  91 %  97 %  Rating of Perceived Exertion (Exercise)  12  12  12  12     Perceived Dyspnea (Exercise)  12  12  12  12     Symptoms  pt. has been suffering from sciatica pain  -  -  -    Duration  Continue with 30 min of aerobic exercise without signs/symptoms of physical distress.  Continue with 30 min of aerobic exercise without signs/symptoms of physical distress.  Continue with 30 min of  aerobic exercise without signs/symptoms of physical distress.  Progress to 30 minutes of  aerobic without signs/symptoms of physical distress    Intensity  THRR unchanged  THRR unchanged  THRR unchanged  THRR unchanged      Progression   Progression  Continue to progress workloads to maintain intensity without signs/symptoms of physical distress.  Continue to progress workloads to maintain intensity without signs/symptoms of physical distress.  Continue to progress workloads to maintain intensity without signs/symptoms of physical distress.  Continue to progress workloads to maintain intensity without signs/symptoms of physical distress.    Average METs  2  2.3  2.1  1.95      Resistance Training   Training Prescription  Yes  Yes  Yes  Yes    Weight  3  4  4  4     Reps  10-15  10-15  10-15  10-15      NuStep   Level  -  -  2  2    SPM  -  -  90  65    Minutes  -  -  22  22    METs  -  -  1.8  1.8      Arm Ergometer   Level  2  2  2.4  3    Watts  17  18  20  20     Minutes  17  17  17  17     METs  2.1  2.5  2.4  2.1      Arm/Foot Ergometer   Level  3  3  - switched  back to NS per doctor  -    Watts  15  16  -  -    Minutes  22  22  -  -    METs  1.9  2.1  -  -      Home Exercise Plan   Plans to continue exercise at  Home (comment)  Home (comment)  Home (comment)  Home (comment)    Frequency  Add 2 additional days to program exercise sessions.  Add 2 additional days to program exercise sessions.  Add 2 additional days to program exercise sessions.  Add 2 additional days to program exercise sessions.    Initial Home Exercises Provided  03/20/18  03/20/18  03/20/18  03/20/18       Exercise Comments:  Exercise Comments    Row Name 04/04/18 1023 04/27/18 0816 05/17/18 1459 06/13/18 0956 06/20/18 0759   Exercise Comments  Patient recently began program. He has finished one complete exercise session. We will monitor and progress as tolerated.   Pt. has been doing well in the  program, he has attended 7 sessions. He has tolerated the exercise well. We will continue to monitor progress.   Pt. has attended 10 sessions so far. He continues to tolerated the exercise well and feels as if he is getting stronger and beginning to breath better at home.   Pt. has attended 15 sessions  so far. He is still tolerating the exercise well but is only able to use his arms at this time. We will continue to monitor and progress as we can.   Pt. continues to only be able to use his arms, therefore he is working out on just he arm ergometer at this time.    [place] Name 07/11/18 1528 07/31/18 1038         Exercise Comments  He has been released by his Dr. to use equipment that work his legs again so we reassessed him and started him back on the NuStep level 2. He tolerated that well today. We will monitor and progress as we see fit.   Pt. has been to 22 sessions so far. He continues to build strength and stamina. His legs are tolerating the NuStep well.          Exercise Goals and Review:  Exercise Goals    Row Name 03/20/18 1355             Exercise Goals   Increase Physical Activity  Yes       Intervention  Provide advice, education, support and counseling about physical activity/exercise needs.;Develop an individualized exercise prescription for aerobic and resistive training based on initial evaluation findings, risk stratification, comorbidities and participant's personal goals.       Expected Outcomes  Short Term: Attend rehab on a regular basis to increase amount of physical activity.       Increase Strength and Stamina  Yes       Intervention  Provide advice, education, support and counseling about physical activity/exercise needs.;Develop an individualized exercise prescription for aerobic and resistive training based on initial evaluation findings, risk stratification, comorbidities and participant's personal goals.       Expected Outcomes  Short Term: Increase workloads from  initial exercise prescription for resistance, speed, and METs.       Able to understand and use rate of perceived exertion (RPE) scale  Yes       Intervention  Provide education and explanation on how to use RPE scale       Expected Outcomes  Short Term: Able to use RPE daily in rehab to express subjective intensity level;Long Term:  Able to use RPE to guide intensity level when exercising independently       Able to understand and use Dyspnea scale  Yes       Intervention  Provide education and explanation on how to use Dyspnea scale       Expected Outcomes  Short Term: Able to use Dyspnea scale daily in rehab to express subjective sense of shortness of breath during exertion;Long Term: Able to use Dyspnea scale to guide intensity level when exercising independently       Knowledge and understanding of Target Heart Rate Range (THRR)  Yes       Intervention  Provide education and explanation of THRR including how the numbers were predicted and where they are located for reference       Expected Outcomes  Short Term: Able to state/look up THRR;Long Term: Able to use THRR to govern intensity when exercising independently;Short Term: Able to use daily as guideline for intensity in rehab       Able to check pulse independently  Yes       Intervention  Provide education and demonstration on how to check pulse in carotid and radial arteries.;Review the importance of being able to check your own pulse for safety during independent  exercise       Expected Outcomes  Short Term: Able to explain why pulse checking is important during independent exercise;Long Term: Able to check pulse independently and accurately       Understanding of Exercise Prescription  Yes       Intervention  Provide education, explanation, and written materials on patient's individual exercise prescription       Expected Outcomes  Short Term: Able to explain program exercise prescription;Long Term: Able to explain home exercise  prescription to exercise independently          Exercise Goals Re-Evaluation : Exercise Goals Re-Evaluation    Row Name 04/04/18 1022 04/27/18 0814 05/17/18 1457 06/13/18 0954 07/11/18 1526     Exercise Goal Re-Evaluation   Exercise Goals Review  Increase Strength and Stamina;Increase Physical Activity;Able to understand and use rate of perceived exertion (RPE) scale;Knowledge and understanding of Target Heart Rate Range (THRR);Understanding of Exercise Prescription;Able to check pulse independently;Able to understand and use Dyspnea scale  Increase Strength and Stamina;Increase Physical Activity;Able to understand and use rate of perceived exertion (RPE) scale;Knowledge and understanding of Target Heart Rate Range (THRR);Understanding of Exercise Prescription;Able to check pulse independently;Able to understand and use Dyspnea scale  Increase Strength and Stamina;Increase Physical Activity;Able to understand and use rate of perceived exertion (RPE) scale;Knowledge and understanding of Target Heart Rate Range (THRR);Understanding of Exercise Prescription;Able to check pulse independently;Able to understand and use Dyspnea scale  Increase Strength and Stamina;Increase Physical Activity;Able to understand and use rate of perceived exertion (RPE) scale;Knowledge and understanding of Target Heart Rate Range (THRR);Understanding of Exercise Prescription;Able to check pulse independently;Able to understand and use Dyspnea scale  Increase Strength and Stamina;Increase Physical Activity;Able to understand and use rate of perceived exertion (RPE) scale;Knowledge and understanding of Target Heart Rate Range (THRR);Understanding of Exercise Prescription;Able to check pulse independently;Able to understand and use Dyspnea scale   Comments  Patient has just recently began the program, he has had one full exercise visit. We will continue to monitor and progress as tolerated.   Pt. has been doing well in the program so  far. He has tolerated the exercise well and has stated his breathing is already improving with ADLs.   Pt. continues to do well in PR. He has worked up to level 2 on both the NuStep and Recumbant Elliptical.   Pt. is on sessoin 16 of PR. He is doing well but has experienced some set back. He is only able to use his arms so we have switched both of his machines to the Arm Ergometer.   Pt. has continued to do well in the program despite his set backs and some what spotty attendance. He has been released by his Dr. to use equipment that work his legs again so we reassessed him and started him back on the NuStep.    Expected Outcomes  Increase activity level.   Increase activity level.   Increase activity level.   Increase activity level at home.   Increase activity level at home.    [place] Name 07/31/18 1037             Exercise Goal Re-Evaluation   Exercise Goals Review  Increase Strength and Stamina;Increase Physical Activity;Able to understand and use rate of perceived exertion (RPE) scale;Knowledge and understanding of Target Heart Rate Range (THRR);Understanding of Exercise Prescription;Able to check pulse independently;Able to understand and use Dyspnea scale       Comments  Pt. continues ot do well in PR  when he is here. He has still had inconsistancy with showing up, however he does work hard when he is here. He is tolerating the NuStep well.        Expected Outcomes  Increase activity level at home.           Discharge Exercise Prescription (Final Exercise Prescription Changes): Exercise Prescription Changes - 07/31/18 1000      Response to Exercise   Blood Pressure (Admit)  118/66    Blood Pressure (Exercise)  128/60    Blood Pressure (Exit)  122/60    Heart Rate (Admit)  78 bpm    Heart Rate (Exercise)  62 bpm    Heart Rate (Exit)  76 bpm    Oxygen Saturation (Admit)  92 %    Oxygen Saturation (Exercise)  91 %    Oxygen Saturation (Exit)  97 %    Rating of Perceived Exertion  (Exercise)  12    Perceived Dyspnea (Exercise)  12    Duration  Progress to 30 minutes of  aerobic without signs/symptoms of physical distress    Intensity  THRR unchanged      Progression   Progression  Continue to progress workloads to maintain intensity without signs/symptoms of physical distress.    Average METs  1.95      Resistance Training   Training Prescription  Yes    Weight  4    Reps  10-15      NuStep   Level  2    SPM  65    Minutes  22    METs  1.8      Arm Ergometer   Level  3    Watts  20    Minutes  17    METs  2.1      Home Exercise Plan   Plans to continue exercise at  Home (comment)    Frequency  Add 2 additional days to program exercise sessions.    Initial Home Exercises Provided  03/20/18       Nutrition:  Target Goals: Understanding of nutrition guidelines, daily intake of sodium 1500mg , cholesterol 200mg , calories 30% from fat and 7% or less from saturated fats, daily to have 5 or more servings of fruits and vegetables.  Biometrics: Pre Biometrics - 03/20/18 1355      Pre Biometrics   Height  5\' 11"  (1.803 m)    Waist Circumference  44 inches    Hip Circumference  43.5 inches    Waist to Hip Ratio  1.01 %    Triceps Skinfold  10 mm    % Body Fat  28.6 %    Grip Strength  18.7 kg    Single Leg Stand  1.5 seconds        Nutrition Therapy Plan and Nutrition Goals: Nutrition Therapy & Goals - 07/31/18 1303      Nutrition Therapy   RD appointment deferred  Yes      Personal Nutrition Goals   Comments  Patient continues to be encouraged to attend RD meeting. He continues to feel he is meeting his nutrition goals of less carbohydrates and sweets. Will continue to monitor for progress.       Intervention Plan   Intervention  Nutrition handout(s) given to patient.       Nutrition Assessments: Nutrition Assessments - 03/20/18 1459      MEDFICTS Scores   Pre Score  67       Nutrition Goals Re-Evaluation:  Nutrition Goals  Discharge (Final Nutrition Goals Re-Evaluation):   Psychosocial: Target Goals: Acknowledge presence or absence of significant depression and/or stress, maximize coping skills, provide positive support system. Participant is able to verbalize types and ability to use techniques and skills needed for reducing stress and depression.  Initial Review & Psychosocial Screening: Initial Psych Review & Screening - 03/20/18 1456      Initial Review   Current issues with  None Identified      Family Dynamics   Good Support System?  Yes    Concerns  Recent loss of child   Patient seems to be coping with this tragedy.     Barriers   Psychosocial barriers to participate in program  There are no identifiable barriers or psychosocial needs.      Screening Interventions   Interventions  Encouraged to exercise    Expected Outcomes  Short Term goal: Identification and review with participant of any Quality of Life or Depression concerns found by scoring the questionnaire.;Long Term goal: The participant improves quality of Life and PHQ9 Scores as seen by post scores and/or verbalization of changes       Quality of Life Scores: Quality of Life - 03/20/18 1356      Quality of Life   Select  Quality of Life      Quality of Life Scores   Health/Function Pre  23.96 %    Socioeconomic Pre  25.5 %    Psych/Spiritual Pre  25.6 %    Family Pre  30 %    GLOBAL Pre  25.23 %      Scores of 19 and below usually indicate a poorer quality of life in these areas.  A difference of  2-3 points is a clinically meaningful difference.  A difference of 2-3 points in the total score of the Quality of Life Index has been associated with significant improvement in overall quality of life, self-image, physical symptoms, and general health in studies assessing change in quality of life.   PHQ-9: Recent Review Flowsheet Data    Depression screen [facility] 2/9 03/20/2018 11/26/2015   Decreased Interest 0 0   Down,  Depressed, Hopeless 0 0   PHQ - 2 Score 0 0   Altered sleeping 0 0   Tired, decreased energy 0 1   Change in appetite 0 1   Feeling bad or failure about yourself  1 -   Trouble concentrating 0 0   Moving slowly or fidgety/restless 0 0   Suicidal thoughts 0 0   PHQ-9 Score 1 2   Difficult doing work/chores Not difficult at all Not difficult at all     Interpretation of Total Score  Total Score Depression Severity:  1-4 = Minimal depression, 5-9 = Mild depression, 10-14 = Moderate depression, 15-19 = Moderately severe depression, 20-27 = Severe depression   Psychosocial Evaluation and Intervention: Psychosocial Evaluation - 03/20/18 1458      Psychosocial Evaluation & Interventions   Interventions  Encouraged to exercise with the program and follow exercise prescription    Continue Psychosocial Services   No Follow up required       Psychosocial Re-Evaluation: Psychosocial Re-Evaluation    [place] Name 04/27/18 1530 05/18/18 1509 06/15/18 1243 07/13/18 0750 07/31/18 1306     Psychosocial Re-Evaluation   Current issues with  None Identified  None Identified  None Identified  None Identified  None Identified   Comments  Patient's QOL score was 25.43 and PHQ-9 score was 2. He  has no psychosocial issues identified.   Patient's QOL score was 25.43 and PHQ-9 score was 2. He has no psychosocial issues identified.   Patient's QOL score was 25.43 and PHQ-9 score was 2. He has no psychosocial issues identified.   Patient's QOL score was 25.43 and PHQ-9 score was 2. He has no psychosocial issues identified.   Patient's QOL score was 25.43 and PHQ-9 score was 2. He has no psychosocial issues identified.    Expected Outcomes  Patient will have no psychosocial issues identified at discharge.   Patient will have no psychosocial issues identified at discharge.   Patient will have no psychosocial issues identified at discharge.   Patient will have no psychosocial issues identified at discharge.   Patient  will have no psychosocial issues identified at discharge.    Interventions  Stress management education;Relaxation education;Encouraged to attend Pulmonary Rehabilitation for the exercise  Stress management education;Relaxation education;Encouraged to attend Pulmonary Rehabilitation for the exercise  Stress management education;Relaxation education;Encouraged to attend Pulmonary Rehabilitation for the exercise  Stress management education;Relaxation education;Encouraged to attend Pulmonary Rehabilitation for the exercise  Stress management education;Relaxation education;Encouraged to attend Pulmonary Rehabilitation for the exercise   Continue Psychosocial Services   No Follow up required  No Follow up required  No Follow up required  No Follow up required  No Follow up required      Psychosocial Discharge (Final Psychosocial Re-Evaluation): Psychosocial Re-Evaluation - 07/31/18 1306      Psychosocial Re-Evaluation   Current issues with  None Identified    Comments  Patient's QOL score was 25.43 and PHQ-9 score was 2. He has no psychosocial issues identified.     Expected Outcomes  Patient will have no psychosocial issues identified at discharge.     Interventions  Stress management education;Relaxation education;Encouraged to attend Pulmonary Rehabilitation for the exercise    Continue Psychosocial Services   No Follow up required        Education: Education Goals: Education classes will be provided on a weekly basis, covering required topics. Participant will state understanding/return demonstration of topics presented.  Learning Barriers/Preferences: Learning Barriers/Preferences - 03/20/18 1447      Learning Barriers/Preferences   Learning Barriers  None    Learning Preferences  Individual Instruction;Group Instruction;Skilled Demonstration       Education Topics: How Lungs Work and Diseases: - Discuss the anatomy of the lungs and diseases that can affect the lungs, such as  COPD.   Exercise: -Discuss the importance of exercise, FITT principles of exercise, normal and abnormal responses to exercise, and how to exercise safely.   Environmental Irritants: -Discuss types of environmental irritants and how to limit exposure to environmental irritants.   Meds/Inhalers and oxygen: - Discuss respiratory medications, definition of an inhaler and oxygen, and the proper way to use an inhaler and oxygen.   PULMONARY REHAB OTHER RESPIRATORY from 07/06/2018 in [place]  Date  06/29/18  Educator  Wynetta Emery      Energy Saving Techniques: - Discuss methods to conserve energy and decrease shortness of breath when performing activities of daily living.    PULMONARY REHAB OTHER RESPIRATORY from 07/06/2018 in [place]  Date  07/06/18  Educator  Wynetta Emery  Instruction Review Code  2- Demonstrated Understanding      Bronchial Hygiene / Breathing Techniques: - Discuss breathing mechanics, pursed-lip breathing technique,  proper posture, effective ways to clear airways, and other functional breathing techniques   PULMONARY REHAB OTHER RESPIRATORY from 07/06/2018 in  [place]  Date  04/13/18  Educator  Wynetta Emery  Instruction Review Code  2- Demonstrated Understanding      Cleaning Equipment: - Provides group verbal and written instruction about the health risks of elevated stress, cause of high stress, and healthy ways to reduce stress.   Nutrition I: Fats: - Discuss the types of cholesterol, what cholesterol does to the body, and how cholesterol levels can be controlled.   PULMONARY REHAB OTHER RESPIRATORY from 07/06/2018 in [place]  Date  04/27/18  Educator  Wynetta Emery  Instruction Review Code  2- Demonstrated Understanding      Nutrition II: Labels: -Discuss the different components of food labels and how to read food labels.   PULMONARY REHAB OTHER RESPIRATORY from  07/06/2018 in [place]  Date  05/04/18  Educator  D. Coad  Instruction Review Code  2- Demonstrated Understanding      Respiratory Infections: - Discuss the signs and symptoms of respiratory infections, ways to prevent respiratory infections, and the importance of seeking medical treatment when having a respiratory infection.   Stress I: Signs and Symptoms: - Discuss the causes of stress, how stress may lead to anxiety and depression, and ways to limit stress.   PULMONARY REHAB OTHER RESPIRATORY from 07/06/2018 in [place]  Date  05/25/18  Educator  MB  Instruction Review Code  2- Demonstrated Understanding      Stress II: Relaxation: -Discuss relaxation techniques to limit stress.   PULMONARY REHAB OTHER RESPIRATORY from 07/06/2018 in [place]  Date  06/01/18  Educator  Wynetta Emery  Instruction Review Code  2- Demonstrated Understanding      Oxygen for Home/Travel: - Discuss how to prepare for travel when on oxygen and proper ways to transport and store oxygen to ensure safety.   PULMONARY REHAB OTHER RESPIRATORY from 07/06/2018 in [place]  Date  06/08/18  Educator  DJ  Instruction Review Code  2- Demonstrated Understanding      Knowledge Questionnaire Score: Knowledge Questionnaire Score - 03/20/18 1448      Knowledge Questionnaire Score   Pre Score  15/17       Core Components/Risk Factors/Patient Goals at Admission: Personal Goals and Risk Factors at Admission - 03/20/18 1459      Core Components/Risk Factors/Patient Goals on Admission    Weight Management  Weight Maintenance    Personal Goal Other  Yes    Personal Goal  Increase his activity    Intervention  Come to CR 3 days/week and supplement with 2 more days at home.     Expected Outcomes  To reach personal goals.        Core Components/Risk Factors/Patient Goals Review:  Goals and Risk Factor Review    Row  Name 04/27/18 1528 05/18/18 1507 06/15/18 1241 07/13/18 0748 07/31/18 1304     Core Components/Risk Factors/Patient Goals Review   Personal Goals Review  Weight Management/Obesity;Improve shortness of breath with ADL's Gain strength; increase activities.   Weight Management/Obesity;Improve shortness of breath with ADL's Gain strength; increase activities.   Weight Management/Obesity;Improve shortness of breath with ADL's Gain strength; increase activities.   Weight Management/Obesity;Improve shortness of breath with ADL's Gain strength; increase activities.   Weight Management/Obesity;Improve shortness of breath with ADL's Gain strength; increase activities.    Review  Patient has completed 8 sessions gaining 8 lbs since he started the program. He admitts to needing to work on his diet and  blames it on the holiday season. He is doing well in the program with progression. He says he is feeling somewhat stronger and hopes to continue to improve as the continues to attend. Will continue to monitor for progress.   Patient has completed 10 session losing 4 lbs since his last 30 day review. He is doing well in the program with progression but has been out for the past week with URI. He says this has helped him get moving and start an exercise program he hopes to continue. Will continue to monitor for progress.   Patient has completed 16 sessions gaining 1 lb since last 30 day review. He continues to do well in the program with progression. He states he is breathing better and is getting stronger. Will continue to monitor for progress.   Patient has completed 21 sessions losing 1 lb since last 30 day review. He continues to do well in the program with progression. Patient states he has increased some of his activities but he wants to do more. He is not going to do Physical therapy now for siatic nerve problems because his pain has improved. He is pleased with his progress in the program. Will continue to monitor for  progress.   Patient has completed 22 sessions losing 3 lbs since last 30 day review. He is doing well in the program with progression. He says he is able to do more around the house and that he is breathing better overall and has more stamina. He feels the program is helping him meet his goals. He has increased his activites. Outpatient pulmonary rehab has suspended services due to the COVID-19 restrictions. Will continue to monitor.    Expected Outcomes  Patient will continue to attend sessions and complete the program meeting his personal goals.   Patient will continue to attend sessions and complete the program meeting his personal goals.   Patient will continue to attend sessions and complete the program meeting his personal goals.   Patient will continue to attend sessions and complete the program meeting his personal goals.   Patient will continue to attend sessions and complete the program meeting his personal goals.       Core Components/Risk Factors/Patient Goals at Discharge (Final Review):  Goals and Risk Factor Review - 07/31/18 1304      Core Components/Risk Factors/Patient Goals Review   Personal Goals Review  Weight Management/Obesity;Improve shortness of breath with ADL's   Gain strength; increase activities.    Review  Patient has completed 22 sessions losing 3 lbs since last 30 day review. He is doing well in the program with progression. He says he is able to do more around the house and that he is breathing better overall and has more stamina. He feels the program is helping him meet his goals. He has increased his activites. Outpatient pulmonary rehab has suspended services due to the COVID-19 restrictions. Will continue to monitor.     Expected Outcomes  Patient will continue to attend sessions and complete the program meeting his personal goals.        ITP Comments: ITP Comments    Row Name 03/20/18 1512 04/10/18 1415 07/31/18 1309       ITP Comments  Patient has been in  our program before with his heart. He is returning with emphysema. He is now retired and plans to complete the program this time.   Patient is new to program. He has completed 3 sessions. Will continue to monitor for  progress.   Pulmonary rehab services was suspended 07/24/18 due to COVID-19 restrictions. Services will resume when restrictions are lifted. Will continue to monitor.         Comments: ITP REVIEW Pt is making expected progress toward pulmonary rehab goals after completing 22 sessions. Recommend continued exercise, life style modification, education, and utilization of breathing techniques to increase stamina and strength and decrease shortness of breath with exertion.  Pulmonary rehab services was suspended 07/24/18 due to the COVID-19 restrictions. Patient will resume rehab once restrictions have been removed. Will continue to monitor.

## 2018-07-31 NOTE — Progress Notes (Signed)
Remote pacemaker transmission.   

## 2018-08-16 ENCOUNTER — Other Ambulatory Visit (HOSPITAL_COMMUNITY): Payer: Self-pay | Admitting: *Deleted

## 2018-08-16 DIAGNOSIS — D508 Other iron deficiency anemias: Secondary | ICD-10-CM

## 2018-08-21 ENCOUNTER — Telehealth (HOSPITAL_COMMUNITY): Payer: Self-pay | Admitting: *Deleted

## 2018-08-21 ENCOUNTER — Ambulatory Visit (HOSPITAL_COMMUNITY): Payer: Medicare Other

## 2018-08-21 ENCOUNTER — Other Ambulatory Visit (HOSPITAL_COMMUNITY): Payer: Medicare Other

## 2018-08-21 NOTE — Telephone Encounter (Signed)
Called patient to verify email address. Check on his home exercise progress during our CR closure. Informed that we will be sending out exercise video routines. Patient said he is doing very little exercise at this time. Will follow up soon.

## 2018-08-22 ENCOUNTER — Other Ambulatory Visit: Payer: Self-pay

## 2018-08-22 ENCOUNTER — Ambulatory Visit (HOSPITAL_COMMUNITY)
Admission: RE | Admit: 2018-08-22 | Discharge: 2018-08-22 | Disposition: A | Discharge status: Home / Self Care | Payer: Medicare Other | Source: Ambulatory Visit | Attending: Internal Medicine | Admitting: Internal Medicine

## 2018-08-22 ENCOUNTER — Inpatient Hospital Stay (HOSPITAL_COMMUNITY): Payer: Medicare Other | Attending: Hematology

## 2018-08-22 DIAGNOSIS — D509 Iron deficiency anemia, unspecified: Secondary | ICD-10-CM | POA: Insufficient documentation

## 2018-08-22 DIAGNOSIS — I2510 Atherosclerotic heart disease of native coronary artery without angina pectoris: Secondary | ICD-10-CM | POA: Insufficient documentation

## 2018-08-22 DIAGNOSIS — Z79899 Other long term (current) drug therapy: Secondary | ICD-10-CM | POA: Insufficient documentation

## 2018-08-22 DIAGNOSIS — R918 Other nonspecific abnormal finding of lung field: Secondary | ICD-10-CM | POA: Insufficient documentation

## 2018-08-22 DIAGNOSIS — J432 Centrilobular emphysema: Secondary | ICD-10-CM | POA: Diagnosis not present

## 2018-08-22 DIAGNOSIS — I700 Atherosclerosis of aorta: Secondary | ICD-10-CM | POA: Diagnosis not present

## 2018-08-22 DIAGNOSIS — Z87891 Personal history of nicotine dependence: Secondary | ICD-10-CM | POA: Insufficient documentation

## 2018-08-22 DIAGNOSIS — E119 Type 2 diabetes mellitus without complications: Secondary | ICD-10-CM | POA: Insufficient documentation

## 2018-08-22 DIAGNOSIS — D508 Other iron deficiency anemias: Secondary | ICD-10-CM

## 2018-08-22 DIAGNOSIS — I10 Essential (primary) hypertension: Secondary | ICD-10-CM | POA: Diagnosis not present

## 2018-08-22 DIAGNOSIS — J90 Pleural effusion, not elsewhere classified: Secondary | ICD-10-CM | POA: Diagnosis not present

## 2018-08-22 LAB — CBC WITH DIFFERENTIAL/PLATELET
Abs Immature Granulocytes: 0.05 10*3/uL (ref 0.00–0.07)
Basophils Absolute: 0.1 10*3/uL (ref 0.0–0.1)
Basophils Relative: 1 %
Eosinophils Absolute: 1.1 10*3/uL — ABNORMAL HIGH (ref 0.0–0.5)
Eosinophils Relative: 12 %
HCT: 42.7 % (ref 39.0–52.0)
Hemoglobin: 13.1 g/dL (ref 13.0–17.0)
Immature Granulocytes: 1 %
Lymphocytes Relative: 8 %
Lymphs Abs: 0.7 10*3/uL (ref 0.7–4.0)
MCH: 28.8 pg (ref 26.0–34.0)
MCHC: 30.7 g/dL (ref 30.0–36.0)
MCV: 93.8 fL (ref 80.0–100.0)
Monocytes Absolute: 0.8 10*3/uL (ref 0.1–1.0)
Monocytes Relative: 9 %
Neutro Abs: 6.4 10*3/uL (ref 1.7–7.7)
Neutrophils Relative %: 69 %
Platelets: 325 10*3/uL (ref 150–400)
RBC: 4.55 MIL/uL (ref 4.22–5.81)
RDW: 16.1 % — ABNORMAL HIGH (ref 11.5–15.5)
WBC: 9.1 10*3/uL (ref 4.0–10.5)
nRBC: 0 % (ref 0.0–0.2)

## 2018-08-22 LAB — COMPREHENSIVE METABOLIC PANEL
ALT: 17 U/L (ref 0–44)
AST: 15 U/L (ref 15–41)
Albumin: 3.9 g/dL (ref 3.5–5.0)
Alkaline Phosphatase: 91 U/L (ref 38–126)
Anion gap: 10 (ref 5–15)
BUN: 32 mg/dL — ABNORMAL HIGH (ref 8–23)
CO2: 24 mmol/L (ref 22–32)
Calcium: 9.5 mg/dL (ref 8.9–10.3)
Chloride: 104 mmol/L (ref 98–111)
Creatinine, Ser: 1.17 mg/dL (ref 0.61–1.24)
GFR calc Af Amer: >60 mL/min (ref >60)
GFR calc non Af Amer: >60 mL/min (ref >60)
Glucose, Bld: 126 mg/dL — ABNORMAL HIGH (ref 70–99)
Potassium: 5.3 mmol/L — ABNORMAL HIGH (ref 3.5–5.1)
Sodium: 138 mmol/L (ref 135–145)
Total Bilirubin: 1.1 mg/dL (ref 0.3–1.2)
Total Protein: 7.4 g/dL (ref 6.5–8.1)

## 2018-08-22 LAB — LACTATE DEHYDROGENASE: LDH: 168 U/L (ref 98–192)

## 2018-08-22 LAB — FERRITIN: Ferritin: 183 ng/mL (ref 24–336)

## 2018-08-22 MED ORDER — IOHEXOL 300 MG/ML  SOLN
75.0000 mL | Freq: Once | INTRAMUSCULAR | Status: AC | PRN
  Administered 2018-08-22: 10:00:00 75 mL via INTRAVENOUS

## 2018-08-23 ENCOUNTER — Other Ambulatory Visit: Payer: Self-pay

## 2018-08-24 ENCOUNTER — Encounter (HOSPITAL_COMMUNITY): Payer: Self-pay | Admitting: Hematology

## 2018-08-24 ENCOUNTER — Inpatient Hospital Stay (HOSPITAL_BASED_OUTPATIENT_CLINIC_OR_DEPARTMENT_OTHER): Payer: Medicare Other | Admitting: Hematology

## 2018-08-24 VITALS — BP 122/72 | HR 78 | Temp 97.8°F | Resp 18 | Wt 214.4 lb

## 2018-08-24 DIAGNOSIS — Z87891 Personal history of nicotine dependence: Secondary | ICD-10-CM | POA: Diagnosis not present

## 2018-08-24 DIAGNOSIS — E119 Type 2 diabetes mellitus without complications: Secondary | ICD-10-CM | POA: Diagnosis not present

## 2018-08-24 DIAGNOSIS — I2510 Atherosclerotic heart disease of native coronary artery without angina pectoris: Secondary | ICD-10-CM | POA: Diagnosis not present

## 2018-08-24 DIAGNOSIS — D509 Iron deficiency anemia, unspecified: Secondary | ICD-10-CM

## 2018-08-24 DIAGNOSIS — D508 Other iron deficiency anemias: Secondary | ICD-10-CM

## 2018-08-24 DIAGNOSIS — I10 Essential (primary) hypertension: Secondary | ICD-10-CM | POA: Diagnosis not present

## 2018-08-24 DIAGNOSIS — R918 Other nonspecific abnormal finding of lung field: Secondary | ICD-10-CM | POA: Diagnosis not present

## 2018-08-24 NOTE — Assessment & Plan Note (Signed)
1.  Iron deficiency anemia: - Last Injectafer on 02/03/2018 and 02/10/2018. -Denies any bleeding per rectum or melena. -Patient tried taking oral iron in the past but did not have a response. -We reviewed blood work today.  Hemoglobin is normal at 13.1.  Ferritin is 183.  He does not require any iron therapy at this time. -We will see him back in 4 months for follow-up.  2.  Right lung nodule: -I discussed the results of the CT of the chest with contrast dated 08/22/2018 which shows right middle lobe lung nodules including the 4 mm nodule has been unchanged since last year.  These can be presumed benign. - The posterolateral left upper lobe pleural-based soft tissue density on the order of 2 cm is favored to represent an area of rounded atelectasis or scarring.  This is also unchanged from April 2019.  Consider CT follow-up at 6 months to confirm stability.

## 2018-08-24 NOTE — Progress Notes (Signed)
Christian Rasmussen, [place] [address]   CLINIC:  Medical Oncology/Hematology  PCP:  Monico Blitz, [place] [address] [phone]   REASON FOR VISIT:  Follow-up for IDA     INTERVAL HISTORY:  Christian Rasmussen 78 y.o. male returns for routine follow-up. He is here today alone. He states that he has been doing well since his last visit. Denies any nausea, vomiting, or diarrhea. Denies any new pains. Had not noticed any recent bleeding such as epistaxis, hematuria or hematochezia. Denies recent chest pain on exertion, pre-syncopal episodes, or palpitations. Denies any numbness or tingling in hands or feet. Denies any recent fevers, infections, or recent hospitalizations. Patient reports appetite at 75% and energy level at 25%.     REVIEW OF SYSTEMS:  Review of Systems  Respiratory: Positive for shortness of breath.      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/2015   Complex valvuloplasty including artificial Gore-tex neochord placement x6, decalcification of posterior annulus, autologous pericardial patch augmentation of posterior leaflet, and 28 mm Sorin Memo 3D ring annuloplasty   Snores    Typical atrial flutter ([place])    s/p ablation 11-23-2012 by Dr Rayann Heman   Past Surgical History:    Procedure Laterality Date   ABLATION  11-23-2012   s/p cavotricuspid isthmus ablation by Dr Rob Hickman SURGERY Left    total ankle replacement   ATRIAL FLUTTER ABLATION N/A 11/23/2012   Procedure: ATRIAL FLUTTER ABLATION;  Surgeon: Thompson Grayer, MD;  Location: [facility] CATH LAB;  Service: Cardiovascular;  Laterality: N/A;   CARDIAC CATHETERIZATION N/A 10/13/2015   Procedure: Left Heart Cath and Coronary Angiography;  Surgeon: Peter M Martinique, MD;  Location: [facility] CV LAB;  Service: Cardiovascular;  Laterality: N/A;   CATARACT EXTRACTION Right    CATARACT EXTRACTION W/PHACO Left 09/13/2013   Procedure: CATARACT EXTRACTION PHACO AND INTRAOCULAR LENS PLACEMENT (IOC);  Surgeon: Tonny Branch, MD;  Location: [facility] ORS;  Service: Ophthalmology;  Laterality: Left;  CDE 9.38   COLONOSCOPY N/A 10/10/2014   Procedure: COLONOSCOPY;  Surgeon: Rogene Houston, MD;  Location: [facility] ENDO SUITE;  Service: Endoscopy;  Laterality: N/A;  [place]     3 stents   CORONARY ARTERY BYPASS GRAFT N/A 10/15/2015   Procedure: CORONARY ARTERY BYPASS GRAFTING (CABG) X4 LIMA-LAD; SEQ SVG-PD-PL; SVG-OM1 ENDOSCOPIC GREATER SAPHENOUS VEIN HARVEST(EVH) BILAT LOWER EXTREM;  Surgeon: Rexene Alberts, MD;  Location: [facility];  Service: Open Heart Surgery;  Laterality: N/A;   EP IMPLANTABLE DEVICE N/A 10/20/2015   Procedure: Pacemaker Implant;  Surgeon: Evans Lance, MD;  Location: [facility] CV LAB;  Service: Cardiovascular;  Laterality: N/A;   MITRAL VALVE REPAIR N/A 10/15/2015  Procedure: MITRAL VALVE REPAIR (MVR), # 14 HFWY 3-D RING ANNULOPLASTY AND COMPLEX VALVE REPAIR;  Surgeon: Rexene Alberts, MD;  Location: [facility];  Service: Open Heart Surgery;  Laterality: N/A;   TEE WITHOUT CARDIOVERSION N/A 10/15/2015   Procedure: TRANSESOPHAGEAL ECHOCARDIOGRAM (TEE);  Surgeon: Rexene Alberts, MD;  Location: [facility];  Service: Open Heart Surgery;  Laterality: N/A;   TEE WITHOUT CARDIOVERSION N/A 09/16/2017   Procedure:  TRANSESOPHAGEAL ECHOCARDIOGRAM (TEE);  Surgeon: Dorothy Spark, MD;  Location: [facility] ENDOSCOPY;  Service: Cardiovascular;  Laterality: N/A;   TEE WITHOUT CARDIOVERSION N/A 06/26/2018   Procedure: TRANSESOPHAGEAL ECHOCARDIOGRAM (TEE);  Surgeon: [name], MD;  Location: [facility];  Service: Cardiology;  Laterality: N/A;     SOCIAL HISTORY:  Social History   Socioeconomic History   Marital status: Married    Spouse name: Not on file   Number of children: 1   Years of education: Not on file   Highest education level: Not on file  Occupational History   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, 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  Social Needs   Financial resource strain: Not on file   Food insecurity:    Worry: Not on file    Inability: Not on file   Transportation needs:    Medical: Not on file    Non-medical: Not on file  Tobacco Use   Smoking status: Former Smoker    Packs/day: 1.00    Years: 28.00    Pack years: 28.00    Types: Cigarettes    Start date: 10/21/1956    Last attempt to quit: 05/10/1985    Years since quitting: 33.3   Smokeless tobacco: Never Used  Substance and Sexual Activity   Alcohol use: Not Currently    Alcohol/week: 0.0 standard drinks    Comment: occasional wine   Drug use: No   Sexual activity: Yes    Birth control/protection: None  Lifestyle   Physical activity:    Days per week: Not on file    Minutes per session: Not on file   Stress: Not on file  Relationships   Social connections:    Talks on phone: Not on file    Gets together: Not on file    Attends religious service: Not on file    Active member of club or organization: Not on file    Attends meetings of clubs or organizations: Not on file    Relationship status: Not on file   Intimate partner violence:    Fear of current or ex partner: Not on file    Emotionally abused: Not on file    Physically abused: Not on file    Forced sexual activity: Not on file  Other Topics Concern    Not on file  Social History Narrative   Lives in [place] with spouse.   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   Other Mother        alive & well   Leukemia Brother 65       died    CURRENT MEDICATIONS:  Outpatient Encounter Medications as of 08/24/2018  Medication Sig   amLODipine (NORVASC) 5 MG tablet Take 1 tablet (5 mg total) by mouth daily.   aspirin EC 81 MG tablet Take 1 tablet (81 mg total) by mouth daily. (Patient taking differently: Take 81 mg by mouth every evening. )  ASTAXANTHIN PO Take 10 mg by mouth daily.    atorvastatin (LIPITOR) 40 MG tablet Take 40 mg by mouth daily.    carvedilol (COREG) 12.5 MG tablet Take 1.5 tablets (18.75 mg total) by mouth 2 (two) times daily.   Coenzyme Q10 200 MG capsule Take 200 mg by mouth daily.    furosemide (LASIX) 40 MG tablet Take 40 mg by mouth See admin instructions. Take 1 tablet (40 mg) by mouth scheduled in the morning, may take an additional dose in the afternoon if needed for ankle swelling.   lisinopril (PRINIVIL,ZESTRIL) 5 MG tablet Take 1 tablet (5 mg total) by mouth daily. (Patient taking differently: Take 20 mg by mouth daily. )   metFORMIN (GLUCOPHAGE) 500 MG tablet Take 1,000 mg by mouth 2 (two) times daily with a meal.    potassium chloride (KLOR-CON 10) 10 MEQ tablet Take 1 tablet (10 mEq total) by mouth 2 (two) times daily.   RELION PRIME TEST test strip USE 1 STRIP TO CHECK GLUCOSE ONCE DAILY   SF 5000 PLUS 1.1 % CREA dental cream Apply 1 application topically See admin instructions. Use 1 application 2 times a week at bedtime.   warfarin (COUMADIN) 5 MG tablet Take 1 1/2 tablets daily except 1 tablet on Mondays and Fridays (Patient taking differently: Take 2.5-5 mg by mouth See admin instructions. Take 0.5 tablet (2.5 mg) by mouth on Mondays, Wednesdays, & Fridays.  Take 1 tablet (5 mg) by mouth on Tuesdays,  Thursdays, Saturdays, & Sundays.)   amoxicillin (AMOXIL) 500 MG capsule Take 2,000 mg by mouth See admin instructions. Take 4 capsules (2000 mg) by for dental cleaning   No facility-administered encounter medications on file as of 08/24/2018.     ALLERGIES:  No Known Allergies   PHYSICAL EXAM:  ECOG Performance status: 1  Vitals:   08/24/18 1107  BP: 122/72  Pulse: 78  Resp: 18  Temp: 97.8 F (36.6 C)  SpO2: 93%   Filed Weights   08/24/18 1107  Weight: 214 lb 6.4 oz (97.3 kg)    Physical Exam Vitals signs reviewed.  Constitutional:      Appearance: Normal appearance.  Cardiovascular:     Rate and Rhythm: Normal rate and regular rhythm.     Heart sounds: Normal heart sounds.  Pulmonary:     Effort: Pulmonary effort is normal.     Breath sounds: Normal breath sounds.  Abdominal:     General: There is no distension.     Palpations: Abdomen is soft. There is no mass.  Musculoskeletal:        General: No swelling.  Skin:    General: Skin is warm.  Neurological:     General: No focal deficit present.     Mental Status: He is alert and oriented to person, place, and time.  Psychiatric:        Mood and Affect: Mood normal.        Behavior: Behavior normal.      LABORATORY DATA:  I have reviewed the labs as listed.  CBC    Component Value Date/Time   WBC 9.1 08/22/2018 0902   RBC 4.55 08/22/2018 0902   HGB 13.1 08/22/2018 0902   HGB 12.9 (L) 09/12/2017 0958   HCT 42.7 08/22/2018 0902   HCT 38.2 09/12/2017 0958   PLT 325 08/22/2018 0902   PLT 323 09/12/2017 0958   MCV 93.8 08/22/2018 0902   MCV 87 09/12/2017 0958   MCH 28.8 08/22/2018 0902  MCHC 30.7 08/22/2018 0902   RDW 16.1 (H) 08/22/2018 0902   RDW 16.1 (H) 09/12/2017 0958   LYMPHSABS 0.7 08/22/2018 0902   MONOABS 0.8 08/22/2018 0902   EOSABS 1.1 (H) 08/22/2018 0902   BASOSABS 0.1 08/22/2018 0902   CMP Latest Ref Rng & Units 08/22/2018 04/11/2018 03/30/2018  Glucose 70 - 99 mg/dL 126(H) 89 87    BUN 8 - 23 mg/dL 32(H) 29(H) 30(H)  Creatinine 0.61 - 1.24 mg/dL 1.17 1.12 1.19  Sodium 135 - 145 mmol/L 138 139 143  Potassium 3.5 - 5.1 mmol/L 5.3(H) 5.0 4.7  Chloride 98 - 111 mmol/L 104 107 107  CO2 22 - 32 mmol/L 24 23 24   Calcium 8.9 - 10.3 mg/dL 9.5 9.6 10.2  Total Protein 6.5 - 8.1 g/dL 7.4 7.8 -  Total Bilirubin 0.3 - 1.2 mg/dL 1.1 0.8 -  Alkaline Phos 38 - 126 U/L 91 84 -  AST 15 - 41 U/L 15 15 -  ALT 0 - 44 U/L 17 14 -       DIAGNOSTIC IMAGING:  I have independently reviewed the scans and discussed with the patient.   I have reviewed Venita Lick LPN's note and agree with the documentation.  I personally performed a face-to-face visit, made revisions and my assessment and plan is as follows.    ASSESSMENT & PLAN:   Iron deficiency anemia 1.  Iron deficiency anemia: - Last Injectafer on 02/03/2018 and 02/10/2018. -Denies any bleeding per rectum or melena. -Patient tried taking oral iron in the past but did not have a response. -We reviewed blood work today.  Hemoglobin is normal at 13.1.  Ferritin is 183.  He does not require any iron therapy at this time. -We will see him back in 4 months for follow-up.  2.  Right lung nodule: -I discussed the results of the CT of the chest with contrast dated 08/22/2018 which shows right middle lobe lung nodules including the 4 mm nodule has been unchanged since last year.  These can be presumed benign. - The posterolateral left upper lobe pleural-based soft tissue density on the order of 2 cm is favored to represent an area of rounded atelectasis or scarring.  This is also unchanged from April 2019.  Consider CT follow-up at 6 months to confirm stability.      Orders placed this encounter:  Orders Placed This Encounter  Procedures   CBC with Differential   Iron and TIBC   Ferritin      Derek Jack, MD [place] [phone]

## 2018-08-24 NOTE — Patient Instructions (Signed)
[place] at [facility] Discharge Instructions  You were seen today by Dr. Delton Coombes. He went over your recent lab results, things have improved. He will see you back in 4 months for labs and follow up.   Thank you for choosing [place] at [facility] to provide your oncology and hematology care.  To afford each patient quality time with our provider, please arrive at least 15 minutes before your scheduled appointment time.   If you have a lab appointment with the [facility] please come in thru the  Main Entrance and check in at the main information desk  You need to re-schedule your appointment should you arrive 10 or more minutes late.  We strive to give you quality time with our providers, and arriving late affects you and other patients whose appointments are after yours.  Also, if you no show three or more times for appointments you may be dismissed from the clinic at the providers discretion.     Again, thank you for choosing [facility].  Our hope is that these requests will decrease the amount of time that you wait before being seen by our physicians.       _____________________________________________________________  Should you have questions after your visit to [facility], please contact our office at [phone] between the hours of 8:00 a.m. and 4:30 p.m.  Voicemails left after 4:00 p.m. will not be returned until the following business day.  For prescription refill requests, have your pharmacy contact our office and allow 72 hours.    Cancer Center Support Programs:   > Cancer Support Group  2nd Tuesday of the month 1pm-2pm, Journey Room

## 2018-08-29 ENCOUNTER — Telehealth (HOSPITAL_COMMUNITY): Payer: Self-pay | Admitting: *Deleted

## 2018-08-29 NOTE — Telephone Encounter (Signed)
Spoke with patient about virtual CR/PR. Patient stated he would like to try it to see if its something he can do. Will call him once we get it started.

## 2018-08-30 DIAGNOSIS — E7800 Pure hypercholesterolemia, unspecified: Secondary | ICD-10-CM | POA: Diagnosis not present

## 2018-08-30 DIAGNOSIS — Z6830 Body mass index (BMI) 30.0-30.9, adult: Secondary | ICD-10-CM | POA: Diagnosis not present

## 2018-08-30 DIAGNOSIS — I10 Essential (primary) hypertension: Secondary | ICD-10-CM | POA: Diagnosis not present

## 2018-08-30 DIAGNOSIS — I509 Heart failure, unspecified: Secondary | ICD-10-CM | POA: Diagnosis not present

## 2018-08-30 DIAGNOSIS — E1151 Type 2 diabetes mellitus with diabetic peripheral angiopathy without gangrene: Secondary | ICD-10-CM | POA: Diagnosis not present

## 2018-08-30 DIAGNOSIS — Z299 Encounter for prophylactic measures, unspecified: Secondary | ICD-10-CM | POA: Diagnosis not present

## 2018-08-30 DIAGNOSIS — Z87891 Personal history of nicotine dependence: Secondary | ICD-10-CM | POA: Diagnosis not present

## 2018-08-30 DIAGNOSIS — E1165 Type 2 diabetes mellitus with hyperglycemia: Secondary | ICD-10-CM | POA: Diagnosis not present

## 2018-09-05 ENCOUNTER — Ambulatory Visit (INDEPENDENT_AMBULATORY_CARE_PROVIDER_SITE_OTHER): Payer: Medicare Other | Admitting: *Deleted

## 2018-09-05 DIAGNOSIS — Z5181 Encounter for therapeutic drug level monitoring: Secondary | ICD-10-CM | POA: Diagnosis not present

## 2018-09-05 DIAGNOSIS — I4891 Unspecified atrial fibrillation: Secondary | ICD-10-CM

## 2018-09-05 LAB — POCT INR: INR: 3.4 — AB (ref 2.0–3.0)

## 2018-09-05 NOTE — Patient Instructions (Signed)
Hold coumadin today then resume 1 tablet daily except 1 1/2 tablets on Tuesdays, Thursdays and Saturdays Recheck in 6 weeks

## 2018-09-25 DIAGNOSIS — E1165 Type 2 diabetes mellitus with hyperglycemia: Secondary | ICD-10-CM | POA: Diagnosis not present

## 2018-09-25 DIAGNOSIS — Z299 Encounter for prophylactic measures, unspecified: Secondary | ICD-10-CM | POA: Diagnosis not present

## 2018-09-25 DIAGNOSIS — J439 Emphysema, unspecified: Secondary | ICD-10-CM | POA: Diagnosis not present

## 2018-09-25 DIAGNOSIS — I4892 Unspecified atrial flutter: Secondary | ICD-10-CM | POA: Diagnosis not present

## 2018-09-25 DIAGNOSIS — E1151 Type 2 diabetes mellitus with diabetic peripheral angiopathy without gangrene: Secondary | ICD-10-CM | POA: Diagnosis not present

## 2018-09-25 DIAGNOSIS — I10 Essential (primary) hypertension: Secondary | ICD-10-CM | POA: Diagnosis not present

## 2018-09-25 DIAGNOSIS — Z6830 Body mass index (BMI) 30.0-30.9, adult: Secondary | ICD-10-CM | POA: Diagnosis not present

## 2018-09-26 ENCOUNTER — Other Ambulatory Visit: Payer: Self-pay | Admitting: Cardiovascular Disease

## 2018-10-16 ENCOUNTER — Telehealth: Payer: Self-pay | Admitting: *Deleted

## 2018-10-16 ENCOUNTER — Telehealth (HOSPITAL_COMMUNITY): Payer: Self-pay | Admitting: *Deleted

## 2018-10-16 NOTE — Telephone Encounter (Signed)
Called patient today to check on him and to see if he would be returning when we re-opened. Patient said he would not be returning nor does he want the virtual rehab program.

## 2018-10-16 NOTE — Telephone Encounter (Signed)
° ° °  COVID-19 Pre-Screening Questions:   In the past 7 to 10 days have you had a cough,  shortness of breath, headache, congestion, fever (100 or greater) body aches, chills, sore throat, or sudden loss of taste or sense of smell? No    Have you been around anyone with known Covid 19. No    Have you been around anyone who is awaiting Covid 19 test results in the past 7 to 10 days?no  Have you been around anyone who has been exposed to Covid 19, or has mentioned symptoms of Covid 19 within the past 7 to 10 days? No    If you have any concerns/questions about symptoms patients report during screening (either on the phone or at threshold). Contact the provider seeing the patient or DOD for further guidance.  If neither are available contact a member of the leadership team.

## 2018-10-17 ENCOUNTER — Ambulatory Visit (INDEPENDENT_AMBULATORY_CARE_PROVIDER_SITE_OTHER): Payer: Medicare Other | Admitting: *Deleted

## 2018-10-17 DIAGNOSIS — Z5181 Encounter for therapeutic drug level monitoring: Secondary | ICD-10-CM | POA: Diagnosis not present

## 2018-10-17 DIAGNOSIS — I4891 Unspecified atrial fibrillation: Secondary | ICD-10-CM | POA: Diagnosis not present

## 2018-10-17 LAB — POCT INR: INR: 2.5 (ref 2.0–3.0)

## 2018-10-17 NOTE — Patient Instructions (Signed)
Continue coumadin 1 tablet daily except 1 1/2 tablets on Tuesdays, Thursdays and Saturdays Recheck in 6 weeks 

## 2018-10-19 NOTE — Addendum Note (Signed)
Encounter addended by: Dwana Melena, RN on: 10/19/2018 3:12 PM  Actions taken: Episode resolved, Clinical Note Signed

## 2018-10-19 NOTE — Progress Notes (Addendum)
Discharge Progress Report  Patient Details  Name: Christian Rasmussen MRN: [id number] Date of Birth: 1941/04/15 Referring Provider:     PULMONARY REHAB OTHER RESP ORIENTATION from 03/20/2018 in [place]  Referring Provider  [place]       Number of Visits: 22  Reason for Discharge:  Early Exit:  Pulmonary Rehab closed 07/24/18 due to COVID-19 restrictions. Patient was called 10/16/18 to check in on him. He stated that he would not be turning to the program.   Smoking History:  Social History   Tobacco Use  Smoking Status Former Smoker  . Packs/day: 1.00  . Years: 28.00  . Pack years: 28.00  . Types: Cigarettes  . Start date: 10/21/1956  . Quit date: 05/10/1985  . Years since quitting: 33.4  Smokeless Tobacco Never Used    Diagnosis:  Centrilobular emphysema (HCC)  ADL UCSD:   Initial Exercise Prescription:   Discharge Exercise Prescription (Final Exercise Prescription Changes): Exercise Prescription Changes - 07/31/18 1000      Response to Exercise   Blood Pressure (Admit)  118/66    Blood Pressure (Exercise)  128/60    Blood Pressure (Exit)  122/60    Heart Rate (Admit)  78 bpm    Heart Rate (Exercise)  62 bpm    Heart Rate (Exit)  76 bpm    Oxygen Saturation (Admit)  92 %    Oxygen Saturation (Exercise)  91 %    Oxygen Saturation (Exit)  97 %    Rating of Perceived Exertion (Exercise)  12    Perceived Dyspnea (Exercise)  12    Duration  Progress to 30 minutes of  aerobic without signs/symptoms of physical distress    Intensity  THRR unchanged      Progression   Progression  Continue to progress workloads to maintain intensity without signs/symptoms of physical distress.    Average METs  1.95      Resistance Training   Training Prescription  Yes    Weight  4    Reps  10-15      NuStep   Level  2    SPM  65    Minutes  22    METs  1.8      Arm Ergometer   Level  3    Watts  20    Minutes  17    METs  2.1      Home Exercise Plan    Plans to continue exercise at  Home (comment)    Frequency  Add 2 additional days to program exercise sessions.    Initial Home Exercises Provided  03/20/18       Functional Capacity:   Psychological, QOL, Others - Outcomes: PHQ 2/9: Depression screen [facility] 2/9 03/20/2018 11/26/2015  Decreased Interest 0 0  Down, Depressed, Hopeless 0 0  PHQ - 2 Score 0 0  Altered sleeping 0 0  Tired, decreased energy 0 1  Change in appetite 0 1  Feeling bad or failure about yourself  1 -  Trouble concentrating 0 0  Moving slowly or fidgety/restless 0 0  Suicidal thoughts 0 0  PHQ-9 Score 1 2  Difficult doing work/chores Not difficult at all Not difficult at all  Some recent data might be hidden    Quality of Life:   Personal Goals: Goals established at orientation with interventions provided to work toward goal.    Personal Goals Discharge: Goals and Risk Factor Review    Row Name 04/27/18 [phone]  05/18/18 1507 06/15/18 1241 07/13/18 0748 07/31/18 1304     Core Components/Risk Factors/Patient Goals Review   Personal Goals Review  Weight Management/Obesity;Improve shortness of breath with ADL's Gain strength; increase activities.   Weight Management/Obesity;Improve shortness of breath with ADL's Gain strength; increase activities.   Weight Management/Obesity;Improve shortness of breath with ADL's Gain strength; increase activities.   Weight Management/Obesity;Improve shortness of breath with ADL's Gain strength; increase activities.   Weight Management/Obesity;Improve shortness of breath with ADL's Gain strength; increase activities.    Review  Patient has completed 8 sessions gaining 8 lbs since he started the program. He admitts to needing to work on his diet and blames it on the holiday season. He is doing well in the program with progression. He says he is feeling somewhat stronger and hopes to continue to improve as the continues to attend. Will continue to monitor for progress.   Patient  has completed 10 session losing 4 lbs since his last 30 day review. He is doing well in the program with progression but has been out for the past week with URI. He says this has helped him get moving and start an exercise program he hopes to continue. Will continue to monitor for progress.   Patient has completed 16 sessions gaining 1 lb since last 30 day review. He continues to do well in the program with progression. He states he is breathing better and is getting stronger. Will continue to monitor for progress.   Patient has completed 21 sessions losing 1 lb since last 30 day review. He continues to do well in the program with progression. Patient states he has increased some of his activities but he wants to do more. He is not going to do Physical therapy now for siatic nerve problems because his pain has improved. He is pleased with his progress in the program. Will continue to monitor for progress.   Patient has completed 22 sessions losing 3 lbs since last 30 day review. He is doing well in the program with progression. He says he is able to do more around the house and that he is breathing better overall and has more stamina. He feels the program is helping him meet his goals. He has increased his activites. Outpatient pulmonary rehab has suspended services due to the COVID-19 restrictions. Will continue to monitor.    Expected Outcomes  Patient will continue to attend sessions and complete the program meeting his personal goals.   Patient will continue to attend sessions and complete the program meeting his personal goals.   Patient will continue to attend sessions and complete the program meeting his personal goals.   Patient will continue to attend sessions and complete the program meeting his personal goals.   Patient will continue to attend sessions and complete the program meeting his personal goals.       Exercise Goals and Review:   Exercise Goals Re-Evaluation: Exercise Goals Re-Evaluation     Row Name 04/27/18 0814 05/17/18 1457 06/13/18 0954 07/11/18 1526 07/31/18 1037     Exercise Goal Re-Evaluation   Exercise Goals Review  Increase Strength and Stamina;Increase Physical Activity;Able to understand and use rate of perceived exertion (RPE) scale;Knowledge and understanding of Target Heart Rate Range (THRR);Understanding of Exercise Prescription;Able to check pulse independently;Able to understand and use Dyspnea scale  Increase Strength and Stamina;Increase Physical Activity;Able to understand and use rate of perceived exertion (RPE) scale;Knowledge and understanding of Target Heart Rate Range (THRR);Understanding of Exercise Prescription;Able to check  pulse independently;Able to understand and use Dyspnea scale  Increase Strength and Stamina;Increase Physical Activity;Able to understand and use rate of perceived exertion (RPE) scale;Knowledge and understanding of Target Heart Rate Range (THRR);Understanding of Exercise Prescription;Able to check pulse independently;Able to understand and use Dyspnea scale  Increase Strength and Stamina;Increase Physical Activity;Able to understand and use rate of perceived exertion (RPE) scale;Knowledge and understanding of Target Heart Rate Range (THRR);Understanding of Exercise Prescription;Able to check pulse independently;Able to understand and use Dyspnea scale  Increase Strength and Stamina;Increase Physical Activity;Able to understand and use rate of perceived exertion (RPE) scale;Knowledge and understanding of Target Heart Rate Range (THRR);Understanding of Exercise Prescription;Able to check pulse independently;Able to understand and use Dyspnea scale   Comments  Pt. has been doing well in the program so far. He has tolerated the exercise well and has stated his breathing is already improving with ADLs.   Pt. continues to do well in PR. He has worked up to level 2 on both the NuStep and Recumbant Elliptical.   Pt. is on sessoin 16 of PR. He is doing  well but has experienced some set back. He is only able to use his arms so we have switched both of his machines to the Arm Ergometer.   Pt. has continued to do well in the program despite his set backs and some what spotty attendance. He has been released by his Dr. to use equipment that work his legs again so we reassessed him and started him back on the NuStep.   Pt. continues ot do well in PR when he is here. He has still had inconsistancy with showing up, however he does work hard when he is here. He is tolerating the NuStep well.    Expected Outcomes  Increase activity level.   Increase activity level.   Increase activity level at home.   Increase activity level at home.   Increase activity level at home.       Nutrition & Weight - Outcomes:    Nutrition: Nutrition Therapy & Goals - 07/31/18 1303      Nutrition Therapy   RD appointment deferred  Yes      Personal Nutrition Goals   Comments  Patient continues to be encouraged to attend RD meeting. He continues to feel he is meeting his nutrition goals of less carbohydrates and sweets. Will continue to monitor for progress.       Intervention Plan   Intervention  Nutrition handout(s) given to patient.       Nutrition Discharge:   Education Questionnaire Score:

## 2018-10-23 ENCOUNTER — Ambulatory Visit (INDEPENDENT_AMBULATORY_CARE_PROVIDER_SITE_OTHER): Payer: Medicare Other | Admitting: *Deleted

## 2018-10-23 DIAGNOSIS — I442 Atrioventricular block, complete: Secondary | ICD-10-CM

## 2018-10-24 LAB — CUP PACEART REMOTE DEVICE CHECK
Battery Remaining Longevity: 126 mo
Battery Remaining Percentage: 100 %
Brady Statistic RA Percent Paced: 3 %
Brady Statistic RV Percent Paced: 81 %
Date Time Interrogation Session: 20200615095200
Implantable Lead Implant Date: 20170612
Implantable Lead Implant Date: 20170612
Implantable Lead Location: 753859
Implantable Lead Location: 753860
Implantable Lead Model: 7741
Implantable Lead Model: 7742
Implantable Lead Serial Number: 733074
Implantable Lead Serial Number: 760165
Implantable Pulse Generator Implant Date: 20170612
Lead Channel Impedance Value: 646 Ohm
Lead Channel Impedance Value: 654 Ohm
Lead Channel Pacing Threshold Amplitude: 0.5 V
Lead Channel Pacing Threshold Pulse Width: 0.4 ms
Lead Channel Setting Pacing Amplitude: 2 V
Lead Channel Setting Pacing Amplitude: 2.5 V
Lead Channel Setting Pacing Pulse Width: 0.4 ms
Lead Channel Setting Sensing Sensitivity: 2.5 mV
Pulse Gen Serial Number: 724121

## 2018-10-30 ENCOUNTER — Encounter: Payer: Self-pay | Admitting: Cardiology

## 2018-10-30 NOTE — Progress Notes (Signed)
Remote pacemaker transmission.   

## 2018-11-06 ENCOUNTER — Other Ambulatory Visit: Payer: Self-pay | Admitting: Cardiovascular Disease

## 2018-11-28 ENCOUNTER — Ambulatory Visit (INDEPENDENT_AMBULATORY_CARE_PROVIDER_SITE_OTHER): Payer: Medicare Other | Admitting: *Deleted

## 2018-11-28 ENCOUNTER — Other Ambulatory Visit: Payer: Self-pay

## 2018-11-28 DIAGNOSIS — Z5181 Encounter for therapeutic drug level monitoring: Secondary | ICD-10-CM

## 2018-11-28 DIAGNOSIS — I4891 Unspecified atrial fibrillation: Secondary | ICD-10-CM | POA: Diagnosis not present

## 2018-11-28 LAB — POCT INR: INR: 2.7 (ref 2.0–3.0)

## 2018-11-28 NOTE — Patient Instructions (Signed)
Continue coumadin 1 tablet daily except 1 1/2 tablets on Tuesdays, Thursdays and Saturdays Recheck in 6 weeks 

## 2018-11-29 DIAGNOSIS — D2262 Melanocytic nevi of left upper limb, including shoulder: Secondary | ICD-10-CM | POA: Diagnosis not present

## 2018-11-29 DIAGNOSIS — L570 Actinic keratosis: Secondary | ICD-10-CM | POA: Diagnosis not present

## 2018-11-29 DIAGNOSIS — L814 Other melanin hyperpigmentation: Secondary | ICD-10-CM | POA: Diagnosis not present

## 2018-11-29 DIAGNOSIS — L821 Other seborrheic keratosis: Secondary | ICD-10-CM | POA: Diagnosis not present

## 2018-12-04 ENCOUNTER — Telehealth: Payer: Self-pay | Admitting: Cardiovascular Disease

## 2018-12-04 MED ORDER — LISINOPRIL 5 MG PO TABS: 5.0000 mg | ORAL_TABLET | Freq: Every day | ORAL | 3 refills | Status: DC

## 2018-12-04 NOTE — Telephone Encounter (Signed)
°*  STAT* If patient is at the pharmacy, call can be transferred to refill team.   1. Which medications need to be refilled?lisinopril (PRINIVIL,ZESTRIL) 5 MG tablet   2. Which pharmacy/location (including street and city if local pharmacy) is medication to be sent to? [facility] in [place]  3. Do they need a 30 day or 90 day supply?

## 2018-12-04 NOTE — Telephone Encounter (Signed)
Done

## 2018-12-11 DIAGNOSIS — Z6832 Body mass index (BMI) 32.0-32.9, adult: Secondary | ICD-10-CM | POA: Diagnosis not present

## 2018-12-11 DIAGNOSIS — E1165 Type 2 diabetes mellitus with hyperglycemia: Secondary | ICD-10-CM | POA: Diagnosis not present

## 2018-12-11 DIAGNOSIS — Z299 Encounter for prophylactic measures, unspecified: Secondary | ICD-10-CM | POA: Diagnosis not present

## 2018-12-11 DIAGNOSIS — I10 Essential (primary) hypertension: Secondary | ICD-10-CM | POA: Diagnosis not present

## 2018-12-11 DIAGNOSIS — M25571 Pain in right ankle and joints of right foot: Secondary | ICD-10-CM | POA: Diagnosis not present

## 2018-12-21 ENCOUNTER — Other Ambulatory Visit: Payer: Self-pay

## 2018-12-21 ENCOUNTER — Other Ambulatory Visit (HOSPITAL_COMMUNITY): Payer: Medicare Other

## 2018-12-21 ENCOUNTER — Inpatient Hospital Stay (HOSPITAL_COMMUNITY): Payer: Medicare Other | Attending: Hematology

## 2018-12-21 DIAGNOSIS — D509 Iron deficiency anemia, unspecified: Secondary | ICD-10-CM | POA: Insufficient documentation

## 2018-12-21 DIAGNOSIS — E119 Type 2 diabetes mellitus without complications: Secondary | ICD-10-CM | POA: Insufficient documentation

## 2018-12-21 DIAGNOSIS — Z7901 Long term (current) use of anticoagulants: Secondary | ICD-10-CM | POA: Insufficient documentation

## 2018-12-21 DIAGNOSIS — Z87891 Personal history of nicotine dependence: Secondary | ICD-10-CM | POA: Diagnosis not present

## 2018-12-21 DIAGNOSIS — E785 Hyperlipidemia, unspecified: Secondary | ICD-10-CM | POA: Diagnosis not present

## 2018-12-21 DIAGNOSIS — I10 Essential (primary) hypertension: Secondary | ICD-10-CM | POA: Insufficient documentation

## 2018-12-21 DIAGNOSIS — Z79899 Other long term (current) drug therapy: Secondary | ICD-10-CM | POA: Insufficient documentation

## 2018-12-21 DIAGNOSIS — Z7984 Long term (current) use of oral hypoglycemic drugs: Secondary | ICD-10-CM | POA: Insufficient documentation

## 2018-12-21 DIAGNOSIS — D508 Other iron deficiency anemias: Secondary | ICD-10-CM

## 2018-12-21 DIAGNOSIS — Z7982 Long term (current) use of aspirin: Secondary | ICD-10-CM | POA: Insufficient documentation

## 2018-12-21 LAB — CBC WITH DIFFERENTIAL/PLATELET
Abs Immature Granulocytes: 0.09 10*3/uL — ABNORMAL HIGH (ref 0.00–0.07)
Basophils Absolute: 0 10*3/uL (ref 0.0–0.1)
Basophils Relative: 0 %
Eosinophils Absolute: 0.2 10*3/uL (ref 0.0–0.5)
Eosinophils Relative: 2 %
HCT: 41.2 % (ref 39.0–52.0)
Hemoglobin: 12.6 g/dL — ABNORMAL LOW (ref 13.0–17.0)
Immature Granulocytes: 1 %
Lymphocytes Relative: 8 %
Lymphs Abs: 0.8 10*3/uL (ref 0.7–4.0)
MCH: 27.8 pg (ref 26.0–34.0)
MCHC: 30.6 g/dL (ref 30.0–36.0)
MCV: 90.9 fL (ref 80.0–100.0)
Monocytes Absolute: 0.8 10*3/uL (ref 0.1–1.0)
Monocytes Relative: 8 %
Neutro Abs: 8.3 10*3/uL — ABNORMAL HIGH (ref 1.7–7.7)
Neutrophils Relative %: 81 %
Platelets: 260 10*3/uL (ref 150–400)
RBC: 4.53 MIL/uL (ref 4.22–5.81)
RDW: 18.9 % — ABNORMAL HIGH (ref 11.5–15.5)
WBC: 10.2 10*3/uL (ref 4.0–10.5)
nRBC: 0 % (ref 0.0–0.2)

## 2018-12-21 LAB — FERRITIN: Ferritin: 178 ng/mL (ref 24–336)

## 2018-12-21 LAB — IRON AND TIBC
Iron: 45 ug/dL (ref 45–182)
Saturation Ratios: 12 % — ABNORMAL LOW (ref 17.9–39.5)
TIBC: 361 ug/dL (ref 250–450)
UIBC: 316 ug/dL

## 2018-12-24 ENCOUNTER — Other Ambulatory Visit: Payer: Self-pay | Admitting: Cardiovascular Disease

## 2018-12-25 ENCOUNTER — Other Ambulatory Visit: Payer: Self-pay | Admitting: *Deleted

## 2018-12-25 MED ORDER — POTASSIUM CHLORIDE ER 10 MEQ PO TBCR: 10.0000 meq | EXTENDED_RELEASE_TABLET | Freq: Two times a day (BID) | ORAL | 3 refills | Status: DC

## 2018-12-26 DIAGNOSIS — R413 Other amnesia: Secondary | ICD-10-CM | POA: Diagnosis not present

## 2018-12-26 DIAGNOSIS — I10 Essential (primary) hypertension: Secondary | ICD-10-CM | POA: Diagnosis not present

## 2018-12-26 DIAGNOSIS — E1165 Type 2 diabetes mellitus with hyperglycemia: Secondary | ICD-10-CM | POA: Diagnosis not present

## 2018-12-26 DIAGNOSIS — I509 Heart failure, unspecified: Secondary | ICD-10-CM | POA: Diagnosis not present

## 2018-12-26 DIAGNOSIS — Z6829 Body mass index (BMI) 29.0-29.9, adult: Secondary | ICD-10-CM | POA: Diagnosis not present

## 2018-12-26 DIAGNOSIS — Z299 Encounter for prophylactic measures, unspecified: Secondary | ICD-10-CM | POA: Diagnosis not present

## 2018-12-28 ENCOUNTER — Other Ambulatory Visit: Payer: Self-pay

## 2018-12-28 ENCOUNTER — Inpatient Hospital Stay (HOSPITAL_BASED_OUTPATIENT_CLINIC_OR_DEPARTMENT_OTHER): Payer: Medicare Other | Admitting: Hematology

## 2018-12-28 VITALS — BP 100/68 | HR 65 | Temp 97.1°F | Resp 18 | Wt 205.5 lb

## 2018-12-28 DIAGNOSIS — E785 Hyperlipidemia, unspecified: Secondary | ICD-10-CM | POA: Diagnosis not present

## 2018-12-28 DIAGNOSIS — R918 Other nonspecific abnormal finding of lung field: Secondary | ICD-10-CM | POA: Diagnosis not present

## 2018-12-28 DIAGNOSIS — Z7901 Long term (current) use of anticoagulants: Secondary | ICD-10-CM | POA: Diagnosis not present

## 2018-12-28 DIAGNOSIS — I10 Essential (primary) hypertension: Secondary | ICD-10-CM | POA: Diagnosis not present

## 2018-12-28 DIAGNOSIS — D508 Other iron deficiency anemias: Secondary | ICD-10-CM | POA: Diagnosis not present

## 2018-12-28 DIAGNOSIS — Z87891 Personal history of nicotine dependence: Secondary | ICD-10-CM | POA: Diagnosis not present

## 2018-12-28 DIAGNOSIS — E119 Type 2 diabetes mellitus without complications: Secondary | ICD-10-CM | POA: Diagnosis not present

## 2018-12-28 DIAGNOSIS — D509 Iron deficiency anemia, unspecified: Secondary | ICD-10-CM | POA: Diagnosis not present

## 2018-12-28 NOTE — Patient Instructions (Signed)
[facility] at [facility] Discharge Instructions Follow up in 4 months with labs    Thank you for choosing [facility] at [facility] to provide your oncology and hematology care.  To afford each patient quality time with our provider, please arrive at least 15 minutes before your scheduled appointment time.   If you have a lab appointment with the Cancer Center please come in thru the  Main Entrance and check in at the main information desk  You need to re-schedule your appointment should you arrive 10 or more minutes late.  We strive to give you quality time with our providers, and arriving late affects you and other patients whose appointments are after yours.  Also, if you no show three or more times for appointments you may be dismissed from the clinic at the providers discretion.     Again, thank you for choosing [facility].  Our hope is that these requests will decrease the amount of time that you wait before being seen by our physicians.       _____________________________________________________________  Should you have questions after your visit to [facility], please contact our office at [phone] between the hours of 8:00 a.m. and 4:30 p.m.  Voicemails left after 4:00 p.m. will not be returned until the following business day.  For prescription refill requests, have your pharmacy contact our office and allow 72 hours.    Cancer Center Support Programs:   > Cancer Support Group  2nd Tuesday of the month 1pm-2pm, Journey Room    

## 2018-12-28 NOTE — Progress Notes (Signed)
Christian Rasmussen, [place] [address]   CLINIC:  Medical Oncology/Hematology  PCP:  Monico Blitz, [place] [address] [phone]   REASON FOR VISIT: Follow-up for iron deficiency anemia.   CURRENT THERAPY: intermittent iron infusions.    INTERVAL HISTORY:  Christian Rasmussen 78 y.o. male returns for routine follow-up for iron deficiency anemia. He reports he has been doing great since his last visit. He has no complaints at this time. Denies any nausea, vomiting, or diarrhea. Denies any new pains. Had not noticed any recent bleeding such as epistaxis, hematuria or hematochezia. Denies recent chest pain on exertion, shortness of breath on minimal exertion, pre-syncopal episodes, or palpitations. Denies any numbness or tingling in hands or feet. Denies any recent fevers, infections, or recent hospitalizations. Patient reports appetite at 75% and energy level at 0%. He is eating well and maintaining his weight at this time.      REVIEW OF SYSTEMS:  Review of Systems  Constitutional: Positive for fatigue.  Respiratory: Positive for shortness of breath.   Cardiovascular: Positive for leg swelling.  All other systems reviewed and are negative.    PAST MEDICAL/SURGICAL HISTORY:  Past Medical History:  Diagnosis Date  . 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 Aortic stenosis 10/14/2015  . CAD (coronary artery disease)    status post Taxus stent patency of RCA 2004, Cardiolite negative for ischemia in 2010.  . Diabetes mellitus ([place])   . Dyslipidemia   . History of kidney stones   . Hypertension   . Iron deficiency anemia 02/02/2018  . Mitral regurgitation 10/15/2015   Moderate-severe by intra-operative TEE  . Overweight   . Right bundle branch block (RBBB) with left anterior hemiblock   . 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 Snores   . Typical atrial flutter [facility])    s/p ablation 11-23-2012 by Dr Rayann Heman   Past Surgical History:  Procedure Laterality Date  . ABLATION  11-23-2012   s/p cavotricuspid isthmus ablation by Dr Rayann Heman  . ANKLE SURGERY Left    total ankle replacement  . ATRIAL FLUTTER ABLATION N/A 11/23/2012   Procedure: ATRIAL FLUTTER ABLATION;  Surgeon: Thompson Grayer, MD;  Location: [facility] CATH LAB;  Service: Cardiovascular;  Laterality: N/A;  . CARDIAC CATHETERIZATION N/A 10/13/2015   Procedure: Left Heart Cath and Coronary Angiography;  Surgeon: Peter M Martinique, MD;  Location: [facility] CV LAB;  Service: Cardiovascular;  Laterality: N/A;  . CATARACT EXTRACTION Right   . CATARACT EXTRACTION W/PHACO Left 09/13/2013   Procedure: CATARACT EXTRACTION PHACO AND INTRAOCULAR LENS PLACEMENT (IOC);  Surgeon: Tonny Branch, MD;  Location: [facility] ORS;  Service: Ophthalmology;  Laterality: Left;  CDE 9.38  . COLONOSCOPY N/A 10/10/2014   Procedure: COLONOSCOPY;  Surgeon: Rogene Houston, MD;  Location: [facility] ENDO SUITE;  Service: Endoscopy;  Laterality: N/A;  830  . CORONARY ANGIOPLASTY     3 stents  . CORONARY ARTERY BYPASS GRAFT N/A 10/15/2015   Procedure: CORONARY ARTERY BYPASS GRAFTING (CABG) X4 LIMA-LAD; SEQ SVG-PD-PL; SVG-OM1 ENDOSCOPIC GREATER SAPHENOUS VEIN HARVEST(EVH) BILAT LOWER EXTREM;  Surgeon: Rexene Alberts, MD;  Location: [facility];  Service: Open Heart Surgery;  Laterality: N/A;  . EP IMPLANTABLE DEVICE N/A 10/20/2015   Procedure: Pacemaker Implant;  Surgeon: Evans Lance, MD;  Location: [facility] CV LAB;  Service: Cardiovascular;  Laterality: N/A;  . MITRAL VALVE REPAIR N/A 10/15/2015   Procedure: MITRAL VALVE REPAIR (MVR), # 28 MEMO 3-D RING ANNULOPLASTY AND COMPLEX VALVE REPAIR;  Surgeon: Rexene Alberts, MD;  Location: [facility];  Service: Open Heart Surgery;  Laterality: N/A;  . TEE WITHOUT CARDIOVERSION N/A 10/15/2015   Procedure: TRANSESOPHAGEAL ECHOCARDIOGRAM (TEE);  Surgeon: Rexene Alberts, MD;  Location: [facility];  Service: Open Heart Surgery;  Laterality: N/A;  . TEE WITHOUT CARDIOVERSION N/A 09/16/2017   Procedure: TRANSESOPHAGEAL ECHOCARDIOGRAM (TEE);  Surgeon: Dorothy Spark, MD;  Location: [facility] ENDOSCOPY;  Service: Cardiovascular;  Laterality: N/A;  . TEE WITHOUT CARDIOVERSION N/A 06/26/2018   Procedure: TRANSESOPHAGEAL ECHOCARDIOGRAM (TEE);  Surgeon: [name], MD;  Location: [facility];  Service: Cardiology;  Laterality: N/A;     SOCIAL HISTORY:  Social History   Socioeconomic History  . Marital status: Married    Spouse name: Not on file  . Number of children: 1  . Years of education: Not on file  . Highest education level: Not on file  Occupational History  . 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 Financial resource strain: Not on file  . Food insecurity    Worry: Not on file    Inability: Not on file  . Transportation needs    Medical: Not on file    Non-medical: Not on file  Tobacco Use  . Smoking status: Former Smoker    Packs/day: 1.00    Years: 28.00    Pack years: 28.00    Types: Cigarettes    Start date: 10/21/1956    Quit date: 05/10/1985    Years since quitting: 33.6  . Smokeless tobacco: Never Used  Substance and Sexual Activity  . Alcohol use: Not Currently    Alcohol/week: 0.0 standard drinks    Comment: occasional wine  . Drug use: No  . Sexual activity: Yes    Birth control/protection: None  Lifestyle  . Physical activity    Days per week: Not on file    Minutes per session: Not on file  . Stress: Not on file  Relationships  . Social Herbalist on phone: Not on file    Gets together: Not on file    Attends religious service: Not on file    Active member of club or organization: Not on file     Attends meetings of clubs or organizations: Not on file    Relationship status: Not on file  . Intimate partner violence    Fear of current or ex partner: Not on file    Emotionally abused: Not on file    Physically abused: Not on file    Forced sexual activity: Not on file  Other Topics Concern  . Not on file  Social History Narrative   Lives in [place] with spouse.   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 Other Father 45       Died from MI.  . Other Mother        alive & well  . Leukemia Brother 52       died    CURRENT MEDICATIONS:  Outpatient Encounter Medications as of 12/28/2018  Medication Sig  .  amLODipine (NORVASC) 5 MG tablet Take 1 tablet (5 mg total) by mouth daily.  Marland Kitchen aspirin EC 81 MG tablet Take 1 tablet (81 mg total) by mouth daily. (Patient taking differently: Take 81 mg by mouth every evening. )  . ASTAXANTHIN PO Take 10 mg by mouth daily.   Marland Kitchen atorvastatin (LIPITOR) 40 MG tablet Take 40 mg by mouth daily.   . carvedilol (COREG) 12.5 MG tablet Take 1.5 tablets (18.75 mg total) by mouth 2 (two) times daily.  . Coenzyme Q10 200 MG capsule Take 200 mg by mouth daily.   . furosemide (LASIX) 40 MG tablet Take 40 mg by mouth See admin instructions. Take 1 tablet (40 mg) by mouth scheduled in the morning, may take an additional dose in the afternoon if needed for ankle swelling.  Marland Kitchen HYDROcodone-acetaminophen (NORCO/VICODIN) 5-325 MG tablet TAKE 1 TABLET BY MOUTH THREE TIMES DAILY AS NEEDED FOR SEVERE PAIN  . lisinopril (ZESTRIL) 5 MG tablet Take 1 tablet (5 mg total) by mouth daily.  . metFORMIN (GLUCOPHAGE) 500 MG tablet Take 1,000 mg by mouth 2 (two) times daily with a meal.   . potassium chloride (KLOR-CON 10) 10 MEQ tablet Take 1 tablet (10 mEq total) by mouth 2 (two) times daily.  . predniSONE (STERAPRED UNI-PAK 21 TAB) 5 MG (21) TBPK tablet TAKE AS DIRECTED PER PACKAGE INSTRUCTIONS START 8 4  . RELION PRIME  TEST test strip USE 1 STRIP TO CHECK GLUCOSE ONCE DAILY  . SF 5000 PLUS 1.1 % CREA dental cream Apply 1 application topically See admin instructions. Use 1 application 2 times a week at bedtime.  Marland Kitchen warfarin (COUMADIN) 5 MG tablet Take 1 tablet daily except 1 1/2 tablets on Tuesdays, Thursdays and Saturdays  . [DISCONTINUED] amoxicillin (AMOXIL) 500 MG capsule Take 2,000 mg by mouth See admin instructions. Take 4 capsules (2000 mg) by for dental cleaning   No facility-administered encounter medications on file as of 12/28/2018.     ALLERGIES:  No Known Allergies   PHYSICAL EXAM:  ECOG Performance status: 1  Vitals:   12/28/18 1138  BP: 100/68  Pulse: 65  Resp: 18  Temp: (!) 97.1 F (36.2 C)  SpO2: 94%   Filed Weights   12/28/18 1138  Weight: 205 lb 8 oz (93.2 kg)    Physical Exam Constitutional:      Appearance: Normal appearance. He is normal weight.  Cardiovascular:     Rate and Rhythm: Normal rate and regular rhythm.     Heart sounds: Normal heart sounds.  Pulmonary:     Effort: Pulmonary effort is normal.     Breath sounds: Normal breath sounds.  Abdominal:     General: Bowel sounds are normal.     Palpations: Abdomen is soft.  Musculoskeletal: Normal range of motion.  Skin:    General: Skin is warm and dry.  Neurological:     Mental Status: He is alert and oriented to person, place, and time. Mental status is at baseline.  Psychiatric:        Mood and Affect: Mood normal.        Behavior: Behavior normal.        Thought Content: Thought content normal.        Judgment: Judgment normal.      LABORATORY DATA:  I have reviewed the labs as listed.  CBC    Component Value Date/Time   WBC 10.2 12/21/2018 0803   RBC 4.53 12/21/2018 0803   HGB 12.6 (L) 12/21/2018  0803   HGB 12.9 (L) 09/12/2017 0958   HCT 41.2 12/21/2018 0803   HCT 38.2 09/12/2017 0958   PLT 260 12/21/2018 0803   PLT 323 09/12/2017 0958   MCV 90.9 12/21/2018 0803   MCV 87 09/12/2017 0958    MCH 27.8 12/21/2018 0803   MCHC 30.6 12/21/2018 0803   RDW 18.9 (H) 12/21/2018 0803   RDW 16.1 (H) 09/12/2017 0958   LYMPHSABS 0.8 12/21/2018 0803   MONOABS 0.8 12/21/2018 0803   EOSABS 0.2 12/21/2018 0803   BASOSABS 0.0 12/21/2018 0803   CMP Latest Ref Rng & Units 08/22/2018 04/11/2018 03/30/2018  Glucose 70 - 99 mg/dL 126(H) 89 87  BUN 8 - 23 mg/dL 32(H) 29(H) 30(H)  Creatinine 0.61 - 1.24 mg/dL 1.17 1.12 1.19  Sodium 135 - 145 mmol/L 138 139 143  Potassium 3.5 - 5.1 mmol/L 5.3(H) 5.0 4.7  Chloride 98 - 111 mmol/L 104 107 107  CO2 22 - 32 mmol/L 24 23 24   Calcium 8.9 - 10.3 mg/dL 9.5 9.6 10.2  Total Protein 6.5 - 8.1 g/dL 7.4 7.8 -  Total Bilirubin 0.3 - 1.2 mg/dL 1.1 0.8 -  Alkaline Phos 38 - 126 U/L 91 84 -  AST 15 - 41 U/L 15 15 -  ALT 0 - 44 U/L 17 14 -       DIAGNOSTIC IMAGING:  I have independently reviewed the scans and discussed with the patient.   I have reviewed Francene Finders, NP's note and agree with the documentation.  I personally performed a face-to-face visit, made revisions and my assessment and plan is as follows.    ASSESSMENT & PLAN:   Iron deficiency anemia 1.  Iron deficiency anemia: - Last Injectafer on 02/10/2018.  He tried oral iron therapy in the past without response. -Denies any bleeding per rectum or melena.  Shortness of breath on exertion is stable. - We reviewed his labs.  Hemoglobin is 12.6.  Ferritin is 178 and percent saturation is 12. -He does not require any parenteral iron therapy at this time.  We can reevaluate in 4 months with repeat labs.  2.  Right lung nodule: - CT of the chest with contrast dated 08/22/2018 shows right middle lobe lung nodules including 4 mm nodule has been unchanged, can be preserved benign. -Posterolateral left upper lobe pleural-based soft tissue density on the order of 2 cm is favored to represent an area of rounded atelectasis or scarring.  This is also unchanged from April 2019. -We will consider  scanning again next year for the later nodule stability.      Orders placed this encounter:  Orders Placed This Encounter  Procedures  . Lactate dehydrogenase  . CBC with Differential/Platelet  . Comprehensive metabolic panel  . Ferritin  . Iron and TIBC  . Vitamin B12  . VITAMIN D 25 Hydroxy (Vit-D Deficiency, Fractures)      Derek Jack, MD [place] [phone]

## 2018-12-31 ENCOUNTER — Encounter (HOSPITAL_COMMUNITY): Payer: Self-pay | Admitting: Hematology

## 2018-12-31 NOTE — Assessment & Plan Note (Signed)
1.  Iron deficiency anemia: - Last Injectafer on 02/10/2018.  He tried oral iron therapy in the past without response. -Denies any bleeding per rectum or melena.  Shortness of breath on exertion is stable. - We reviewed his labs.  Hemoglobin is 12.6.  Ferritin is 178 and percent saturation is 12. -He does not require any parenteral iron therapy at this time.  We can reevaluate in 4 months with repeat labs.  2.  Right lung nodule: - CT of the chest with contrast dated 08/22/2018 shows right middle lobe lung nodules including 4 mm nodule has been unchanged, can be preserved benign. -Posterolateral left upper lobe pleural-based soft tissue density on the order of 2 cm is favored to represent an area of rounded atelectasis or scarring.  This is also unchanged from April 2019. -We will consider scanning again next year for the later nodule stability.

## 2019-01-10 ENCOUNTER — Ambulatory Visit (INDEPENDENT_AMBULATORY_CARE_PROVIDER_SITE_OTHER): Payer: Medicare Other | Admitting: *Deleted

## 2019-01-10 ENCOUNTER — Other Ambulatory Visit: Payer: Self-pay

## 2019-01-10 DIAGNOSIS — I4892 Unspecified atrial flutter: Secondary | ICD-10-CM

## 2019-01-10 DIAGNOSIS — Z01812 Encounter for preprocedural laboratory examination: Secondary | ICD-10-CM

## 2019-01-10 DIAGNOSIS — I350 Nonrheumatic aortic (valve) stenosis: Secondary | ICD-10-CM | POA: Diagnosis not present

## 2019-01-10 DIAGNOSIS — I059 Rheumatic mitral valve disease, unspecified: Secondary | ICD-10-CM | POA: Diagnosis not present

## 2019-01-10 DIAGNOSIS — Z5181 Encounter for therapeutic drug level monitoring: Secondary | ICD-10-CM

## 2019-01-10 DIAGNOSIS — Z9889 Other specified postprocedural states: Secondary | ICD-10-CM

## 2019-01-10 LAB — POCT INR: INR: 3 (ref 2.0–3.0)

## 2019-01-10 NOTE — Patient Instructions (Signed)
Continue coumadin 1 tablet daily except 1 1/2 tablets on Tuesdays, Thursdays and Saturdays Recheck in 6 weeks 

## 2019-01-11 DIAGNOSIS — R413 Other amnesia: Secondary | ICD-10-CM | POA: Diagnosis not present

## 2019-01-12 ENCOUNTER — Telehealth: Payer: Self-pay | Admitting: Cardiovascular Disease

## 2019-01-12 NOTE — Telephone Encounter (Signed)
Virtual Visit Pre-Appointment Phone Call  "(Name), I am calling you today to discuss your upcoming appointment. We are currently trying to limit exposure to the virus that causes COVID-19 by seeing patients at home rather than in the office."  1. "What is the BEST phone number to call the day of the visit?" - include this in appointment notes  2. Do you have or have access to (through a family member/friend) a smartphone with video capability that we can use for your visit?" a. If yes - list this number in appt notes as cell (if different from BEST phone #) and list the appointment type as a VIDEO visit in appointment notes b. If no - list the appointment type as a PHONE visit in appointment notes  3. Confirm consent - "In the setting of the current Covid19 crisis, you are scheduled for a (phone or video) visit with your provider on (date) at (time).  Just as we do with many in-office visits, in order for you to participate in this visit, we must obtain consent.  If you'd like, I can send this to your mychart (if signed up) or email for you to review.  Otherwise, I can obtain your verbal consent now.  All virtual visits are billed to your insurance company just like a normal visit would be.  By agreeing to a virtual visit, we'd like you to understand that the technology does not allow for your provider to perform an examination, and thus may limit your provider's ability to fully assess your condition. If your provider identifies any concerns that need to be evaluated in person, we will make arrangements to do so.  Finally, though the technology is pretty good, we cannot assure that it will always work on either your or our end, and in the setting of a video visit, we may have to convert it to a phone-only visit.  In either situation, we cannot ensure that we have a secure connection.  Are you willing to proceed?" STAFF: Did the patient verbally acknowledge consent to telehealth visit? Document  YES/NO here: yes  4. Advise patient to be prepared - "Two hours prior to your appointment, go ahead and check your blood pressure, pulse, oxygen saturation, and your weight (if you have the equipment to check those) and write them all down. When your visit starts, your provider will ask you for this information. If you have an Apple Watch or Kardia device, please plan to have heart rate information ready on the day of your appointment. Please have a pen and paper handy nearby the day of the visit as well."  5. Give patient instructions for MyChart download to smartphone OR Doximity/Doxy.me as below if video visit (depending on what platform provider is using)  6. Inform patient they will receive a phone call 15 minutes prior to their appointment time (may be from unknown caller ID) so they should be prepared to answer    TELEPHONE CALL NOTE  Christian Rasmussen has been deemed a candidate for a follow-up tele-health visit to limit community exposure during the Covid-19 pandemic. I spoke with the patient via phone to ensure availability of phone/video source, confirm preferred email & phone number, and discuss instructions and expectations.  I reminded Christian Rasmussen to be prepared with any vital sign and/or heart rhythm information that could potentially be obtained via home monitoring, at the time of his visit. I reminded Christian Rasmussen to expect a phone call prior to  his visit.  Christian Rasmussen 01/12/2019 10:23 AM   INSTRUCTIONS FOR DOWNLOADING THE MYCHART APP TO SMARTPHONE  - The patient must first make sure to have activated MyChart and know their login information - If Apple, go to [employer] and type in MyChart in the search bar and download the app. If Android, ask patient to go to [employer] and type in [place] in the search bar and download the app. The app is free but as with any other app downloads, their phone may require them to verify saved payment information or Apple/Android password.    - The patient will need to then log into the app with their MyChart username and password, and select [facility] as their healthcare provider to link the account. When it is time for your visit, go to the MyChart app, find appointments, and click Begin Video Visit. Be sure to Select Allow for your device to access the Microphone and Camera for your visit. You will then be connected, and your provider will be with you shortly.  **If they have any issues connecting, or need assistance please contact MyChart service desk [phone])**  **If using a computer, in order to ensure the best quality for their visit they will need to use either of the following Internet Browsers: [employer], or Google Chrome**  IF USING DOXIMITY or DOXY.ME - The patient will receive a link just prior to their visit by text.     FULL LENGTH CONSENT FOR TELE-HEALTH VISIT   I hereby voluntarily request, consent and authorize [facility] and its employed or contracted physicians, physician assistants, nurse practitioners or other licensed health care professionals (the Practitioner), to provide me with telemedicine health care services (the Services") as deemed necessary by the treating Practitioner. I acknowledge and consent to receive the Services by the Practitioner via telemedicine. I understand that the telemedicine visit will involve communicating with the Practitioner through live audiovisual communication technology and the disclosure of certain medical information by electronic transmission. I acknowledge that I have been given the opportunity to request an in-person assessment or other available alternative prior to the telemedicine visit and am voluntarily participating in the telemedicine visit.  I understand that I have the right to withhold or withdraw my consent to the use of telemedicine in the course of my care at any time, without affecting my right to future care or treatment, and that  the Practitioner or I may terminate the telemedicine visit at any time. I understand that I have the right to inspect all information obtained and/or recorded in the course of the telemedicine visit and may receive copies of available information for a reasonable fee.  I understand that some of the potential risks of receiving the Services via telemedicine include:   Delay or interruption in medical evaluation due to technological equipment failure or disruption;  Information transmitted may not be sufficient (e.g. poor resolution of images) to allow for appropriate medical decision making by the Practitioner; and/or   In rare instances, security protocols could fail, causing a breach of personal health information.  Furthermore, I acknowledge that it is my responsibility to provide information about my medical history, conditions and care that is complete and accurate to the best of my ability. I acknowledge that Practitioner's advice, recommendations, and/or decision may be based on factors not within their control, such as incomplete or inaccurate data provided by me or distortions of diagnostic images or specimens that may result from electronic transmissions.  I understand that the practice of medicine is not an exact science and that Practitioner makes no warranties or guarantees regarding treatment outcomes. I acknowledge that I will receive a copy of this consent concurrently upon execution via email to the email address I last provided but may also request a printed copy by calling the office of [facility].    I understand that my insurance will be billed for this visit.   I have read or had this consent read to me.  I understand the contents of this consent, which adequately explains the benefits and risks of the Services being provided via telemedicine.   I have been provided ample opportunity to ask questions regarding this consent and the Services and have had my questions answered to  my satisfaction.  I give my informed consent for the services to be provided through the use of telemedicine in my medical care  By participating in this telemedicine visit I agree to the above.

## 2019-01-16 ENCOUNTER — Telehealth (INDEPENDENT_AMBULATORY_CARE_PROVIDER_SITE_OTHER): Payer: Medicare Other | Admitting: Cardiovascular Disease

## 2019-01-16 ENCOUNTER — Encounter: Payer: Self-pay | Admitting: Cardiovascular Disease

## 2019-01-16 VITALS — BP 104/78 | HR 93 | Wt 216.0 lb

## 2019-01-16 DIAGNOSIS — I4892 Unspecified atrial flutter: Secondary | ICD-10-CM

## 2019-01-16 DIAGNOSIS — I059 Rheumatic mitral valve disease, unspecified: Secondary | ICD-10-CM

## 2019-01-16 DIAGNOSIS — E785 Hyperlipidemia, unspecified: Secondary | ICD-10-CM

## 2019-01-16 DIAGNOSIS — I359 Nonrheumatic aortic valve disorder, unspecified: Secondary | ICD-10-CM

## 2019-01-16 DIAGNOSIS — I5032 Chronic diastolic (congestive) heart failure: Secondary | ICD-10-CM

## 2019-01-16 DIAGNOSIS — I25708 Atherosclerosis of coronary artery bypass graft(s), unspecified, with other forms of angina pectoris: Secondary | ICD-10-CM | POA: Diagnosis not present

## 2019-01-16 DIAGNOSIS — I350 Nonrheumatic aortic (valve) stenosis: Secondary | ICD-10-CM

## 2019-01-16 DIAGNOSIS — Z9889 Other specified postprocedural states: Secondary | ICD-10-CM

## 2019-01-16 DIAGNOSIS — I442 Atrioventricular block, complete: Secondary | ICD-10-CM

## 2019-01-16 DIAGNOSIS — Z950 Presence of cardiac pacemaker: Secondary | ICD-10-CM

## 2019-01-16 NOTE — Patient Instructions (Signed)

## 2019-01-16 NOTE — Progress Notes (Signed)
Virtual Visit via Telephone Note   This visit type was conducted due to national recommendations for restrictions regarding the COVID-19 Pandemic (e.g. social distancing) in an effort to limit this patient's exposure and mitigate transmission in our community.  Due to his co-morbid illnesses, this patient is at least at moderate risk for complications without adequate follow up.  This format is felt to be most appropriate for this patient at this time.  The patient did not have access to video technology/had technical difficulties with video requiring transitioning to audio format only (telephone).  All issues noted in this document were discussed and addressed.  No physical exam could be performed with this format.  Please refer to the patient's chart for his  consent to telehealth for [facility].   Date:  01/16/2019   ID:  Christian Rasmussen, DOB [birth year], MRN [id number]  Patient Location: Home Provider Location: Office  PCP:  Monico Blitz, MD  Cardiologist:  Kate Sable, MD  Electrophysiologist:  None   Evaluation Performed:  Follow-Up Visit  Chief Complaint: Coronary artery disease, aortic stenosis  History of Present Illness:    Christian Rasmussen is a 78 y.o. male with coronary disease and history of CABG, chronic diastolic heart failure, pacemaker, and aortic stenosis.   He underwent TEE on 06/26/2018 which demonstrated mild to moderate aortic valve stenosis.  Mitral valve repair noted with moderate mitral stenosis and mild mitral regurgitation.  LVEF 50 to 55%.  Right ventricular systolic function was moderately reduced with mild right ventricular enlargement. Overall it was felt there were no significant changes since the prior study.  He said he staggers a little but denies falls. He denies lightheadedness and dizziness. He denies chest pain.   He went for a neurologic assessment recently.  He takes Lasix 80 mg daily.  The patient does not have symptoms concerning for COVID-19  infection (fever, chills, cough).   Soc Hx:Alyson Ingles daughter committed suicide on Oct 01, 2016. Her son lives withthe patientand his wife.   Past Medical History:  Diagnosis Date  . 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 Aortic stenosis 10/14/2015  . CAD (coronary artery disease)    status post Taxus stent patency of RCA 2004, Cardiolite negative for ischemia in 2010.  . Diabetes mellitus ([place])   . Dyslipidemia   . History of kidney stones   . Hypertension   . Iron deficiency anemia 02/02/2018  . Mitral regurgitation 10/15/2015   Moderate-severe by intra-operative TEE  . Overweight   . Right bundle branch block (RBBB) with left anterior hemiblock   . 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 Snores   . Typical atrial flutter [facility])    s/p ablation 11-23-2012 by Dr Rayann Heman   Past Surgical History:  Procedure Laterality Date  . ABLATION  11-23-2012   s/p cavotricuspid isthmus ablation by Dr Rayann Heman  . ANKLE SURGERY Left    total ankle replacement  . ATRIAL FLUTTER ABLATION N/A 11/23/2012   Procedure: ATRIAL FLUTTER ABLATION;  Surgeon: Thompson Grayer, MD;  Location: [facility] CATH LAB;  Service: Cardiovascular;  Laterality: N/A;  . CARDIAC CATHETERIZATION N/A 10/13/2015   Procedure: Left Heart Cath and  Coronary Angiography;  Surgeon: Peter M Martinique, MD;  Location: [place] CV LAB;  Service: Cardiovascular;  Laterality: N/A;  . CATARACT EXTRACTION Right   . CATARACT EXTRACTION W/PHACO Left 09/13/2013   Procedure: CATARACT EXTRACTION PHACO AND INTRAOCULAR LENS PLACEMENT (IOC);  Surgeon: Tonny Branch, MD;  Location: [facility] ORS;  Service: Ophthalmology;  Laterality: Left;  CDE  9.38  . COLONOSCOPY N/A 10/10/2014   Procedure: COLONOSCOPY;  Surgeon: Rogene Houston, MD;  Location: [facility] ENDO SUITE;  Service: Endoscopy;  Laterality: N/A;  830  . CORONARY ANGIOPLASTY     3 stents  . CORONARY ARTERY BYPASS GRAFT N/A 10/15/2015   Procedure: CORONARY ARTERY BYPASS GRAFTING (CABG) X4 LIMA-LAD; SEQ SVG-PD-PL; SVG-OM1 ENDOSCOPIC GREATER SAPHENOUS VEIN HARVEST(EVH) BILAT LOWER EXTREM;  Surgeon: Rexene Alberts, MD;  Location: [place];  Service: Open Heart Surgery;  Laterality: N/A;  . EP IMPLANTABLE DEVICE N/A 10/20/2015   Procedure: Pacemaker Implant;  Surgeon: Evans Lance, MD;  Location: [place] CV LAB;  Service: Cardiovascular;  Laterality: N/A;  . MITRAL VALVE REPAIR N/A 10/15/2015   Procedure: MITRAL VALVE REPAIR (MVR), # 28 MEMO 3-D RING ANNULOPLASTY AND COMPLEX VALVE REPAIR;  Surgeon: Rexene Alberts, MD;  Location: [place];  Service: Open Heart Surgery;  Laterality: N/A;  . TEE WITHOUT CARDIOVERSION N/A 10/15/2015   Procedure: TRANSESOPHAGEAL ECHOCARDIOGRAM (TEE);  Surgeon: Rexene Alberts, MD;  Location: [place];  Service: Open Heart Surgery;  Laterality: N/A;  . TEE WITHOUT CARDIOVERSION N/A 09/16/2017   Procedure: TRANSESOPHAGEAL ECHOCARDIOGRAM (TEE);  Surgeon: Dorothy Spark, MD;  Location: [facility] ENDOSCOPY;  Service: Cardiovascular;  Laterality: N/A;  . TEE WITHOUT CARDIOVERSION N/A 06/26/2018   Procedure: TRANSESOPHAGEAL ECHOCARDIOGRAM (TEE);  Surgeon: [name], MD;  Location: [place];  Service: Cardiology;  Laterality: N/A;     Current Meds  Medication Sig  . amLODipine (NORVASC) 5 MG tablet Take 1 tablet (5 mg total) by mouth daily.  Marland Kitchen aspirin EC 81 MG tablet Take 1 tablet (81 mg total) by mouth daily. (Patient taking differently: Take 81 mg by mouth every evening. )  . ASTAXANTHIN PO Take 10 mg by mouth daily.   Marland Kitchen atorvastatin (LIPITOR) 40 MG tablet Take 40 mg by mouth daily.   . carvedilol (COREG) 12.5 MG tablet Take 1.5 tablets (18.75 mg total) by mouth 2  (two) times daily.  . Coenzyme Q10 200 MG capsule Take 200 mg by mouth daily.   . furosemide (LASIX) 40 MG tablet Take 40 mg by mouth See admin instructions. Take 1 tablet (40 mg) by mouth scheduled in the morning, may take an additional dose in the afternoon if needed for ankle swelling.  Marland Kitchen lisinopril (ZESTRIL) 5 MG tablet Take 1 tablet (5 mg total) by mouth daily.  . metFORMIN (GLUCOPHAGE) 500 MG tablet Take 1,000 mg by mouth 2 (two) times daily with a meal.   . potassium chloride (KLOR-CON 10) 10 MEQ tablet Take 1 tablet (10 mEq total) by mouth 2 (two) times daily.  Marland Kitchen RELION PRIME TEST test strip USE 1 STRIP TO CHECK GLUCOSE ONCE DAILY  . SF 5000 PLUS 1.1 % CREA dental cream Apply 1 application topically See admin instructions. Use 1 application 2 times a week at bedtime.  Marland Kitchen warfarin (COUMADIN) 5 MG tablet Take 1 tablet daily except 1 1/2 tablets on Tuesdays, Thursdays and Saturdays  . [DISCONTINUED] HYDROcodone-acetaminophen (NORCO/VICODIN) 5-325 MG tablet TAKE 1 TABLET BY MOUTH THREE TIMES DAILY AS NEEDED FOR SEVERE PAIN  Allergies:   Patient has no known allergies.   Social History   Tobacco Use  . Smoking status: Former Smoker    Packs/day: 1.00    Years: 28.00    Pack years: 28.00    Types: Cigarettes    Start date: 10/21/1956    Quit date: 05/10/1985    Years since quitting: 33.7  . Smokeless tobacco: Never Used  Substance Use Topics  . Alcohol use: Not Currently    Alcohol/week: 0.0 standard drinks    Comment: occasional wine  . Drug use: No     Family Hx: The patient's family history includes Leukemia (age of onset: 76) in his brother; Other in his mother; Other (age of onset: 47) in his father.  ROS:   Please see the history of present illness.     All other systems reviewed and are negative.   Prior CV studies:   The following studies were reviewed today:  See above  Labs/Other Tests and Data Reviewed:    EKG:  No ECG reviewed.  Recent Labs:  03/30/2018: Pro B Natriuretic peptide (BNP) 513.0 08/22/2018: ALT 17; BUN 32; Creatinine, Ser 1.17; Potassium 5.3; Sodium 138 12/21/2018: Hemoglobin 12.6; Platelets 260   Recent Lipid Panel Lab Results  Component Value Date/Time   CHOL 108 10/14/2015 04:31 AM   TRIG 157 (H) 10/14/2015 04:31 AM   HDL 34 (L) 10/14/2015 04:31 AM   CHOLHDL 3.2 10/14/2015 04:31 AM   LDLCALC 43 10/14/2015 04:31 AM    Wt Readings from Last 3 Encounters:  01/16/19 216 lb (98 kg)  12/28/18 205 lb 8 oz (93.2 kg)  08/24/18 214 lb 6.4 oz (97.3 kg)     Objective:    Vital Signs:  BP 104/78   Pulse 93   Wt 216 lb (98 kg)   BMI 30.13 kg/m    VITAL SIGNS:  reviewed  ASSESSMENT & PLAN:    1. CAD s/p 4-v CABG (June 2017):Symptomatically stable.Continue aspirin, Lipitor, Coreg, and lisinopril.   2. S/p mitral valve repair: Moderate stenosis and mild regurgitation by TEE in February 2020.  I will monitor.  3. S/p PPM for CHB: Stable.Device is functioning normally. F/u with Dr. Lovena Le.  4. Essential HTN: Blood pressure is normal.  No changes to therapy.  5. Hyperlipidemia: Continue Lipitor.  LDL 65 on 05/09/2018.  6. Chronic diastolic heart failure, EF 50-55: Euvolemic. Continue Lasix 80 mg daily.Hehas been instructed to weigh himself daily. Should he develop leg swelling, shortness of breath, or weight gain of 3 pounds within 24 hours, hecan take an extra 40 mg.  Continue carvedilol and lisinopril.  7. Atrial flutter:Anticoagulated withwarfarin.  INR 3 on 01/11/2019.  Hemoglobin 12.  8. Aortic stenosis and regurgitation: Mild to moderate aortic stenosis by TEE in February 2020.  I will monitor.   COVID-19 Education: The signs and symptoms of COVID-19 were discussed with the patient and how to seek care for testing (follow up with PCP or arrange E-visit).  The importance of social distancing was discussed today.  Time:   Today, I have spent 5 minutes with the patient with telehealth  technology discussing the above problems.     Medication Adjustments/Labs and Tests Ordered: Current medicines are reviewed at length with the patient today.  Concerns regarding medicines are outlined above.   Tests Ordered: No orders of the defined types were placed in this encounter.   Medication Changes: No orders of the defined types were placed in this encounter.   Follow Up:  Virtual Visit or In Person in 6 month(s)  Signed, Kate Sable, MD  01/16/2019 11:44 AM    [place]

## 2019-01-22 ENCOUNTER — Ambulatory Visit (INDEPENDENT_AMBULATORY_CARE_PROVIDER_SITE_OTHER): Payer: Medicare Other | Admitting: *Deleted

## 2019-01-22 DIAGNOSIS — I452 Bifascicular block: Secondary | ICD-10-CM

## 2019-01-22 DIAGNOSIS — I5031 Acute diastolic (congestive) heart failure: Secondary | ICD-10-CM | POA: Diagnosis not present

## 2019-01-24 DIAGNOSIS — Z6829 Body mass index (BMI) 29.0-29.9, adult: Secondary | ICD-10-CM | POA: Diagnosis not present

## 2019-01-24 DIAGNOSIS — Z299 Encounter for prophylactic measures, unspecified: Secondary | ICD-10-CM | POA: Diagnosis not present

## 2019-01-24 DIAGNOSIS — R413 Other amnesia: Secondary | ICD-10-CM | POA: Diagnosis not present

## 2019-01-24 DIAGNOSIS — I4892 Unspecified atrial flutter: Secondary | ICD-10-CM | POA: Diagnosis not present

## 2019-01-24 DIAGNOSIS — I10 Essential (primary) hypertension: Secondary | ICD-10-CM | POA: Diagnosis not present

## 2019-01-24 DIAGNOSIS — E1151 Type 2 diabetes mellitus with diabetic peripheral angiopathy without gangrene: Secondary | ICD-10-CM | POA: Diagnosis not present

## 2019-01-24 DIAGNOSIS — E1165 Type 2 diabetes mellitus with hyperglycemia: Secondary | ICD-10-CM | POA: Diagnosis not present

## 2019-01-25 LAB — CUP PACEART REMOTE DEVICE CHECK
Date Time Interrogation Session: 20200917073323
Implantable Lead Implant Date: 20170612
Implantable Lead Implant Date: 20170612
Implantable Lead Location: 753859
Implantable Lead Location: 753860
Implantable Lead Model: 7741
Implantable Lead Model: 7742
Implantable Lead Serial Number: 733074
Implantable Lead Serial Number: 760165
Implantable Pulse Generator Implant Date: 20170612
Pulse Gen Serial Number: 724121

## 2019-02-02 NOTE — Progress Notes (Signed)
Remote pacemaker transmission.   

## 2019-02-13 DIAGNOSIS — E113293 Type 2 diabetes mellitus with mild nonproliferative diabetic retinopathy without macular edema, bilateral: Secondary | ICD-10-CM | POA: Diagnosis not present

## 2019-02-19 DIAGNOSIS — I272 Pulmonary hypertension, unspecified: Secondary | ICD-10-CM | POA: Diagnosis not present

## 2019-02-19 DIAGNOSIS — Z6829 Body mass index (BMI) 29.0-29.9, adult: Secondary | ICD-10-CM | POA: Diagnosis not present

## 2019-02-19 DIAGNOSIS — Z299 Encounter for prophylactic measures, unspecified: Secondary | ICD-10-CM | POA: Diagnosis not present

## 2019-02-19 DIAGNOSIS — I10 Essential (primary) hypertension: Secondary | ICD-10-CM | POA: Diagnosis not present

## 2019-02-19 DIAGNOSIS — R0602 Shortness of breath: Secondary | ICD-10-CM | POA: Diagnosis not present

## 2019-02-19 DIAGNOSIS — J90 Pleural effusion, not elsewhere classified: Secondary | ICD-10-CM | POA: Diagnosis not present

## 2019-02-19 DIAGNOSIS — K4090 Unilateral inguinal hernia, without obstruction or gangrene, not specified as recurrent: Secondary | ICD-10-CM | POA: Diagnosis not present

## 2019-02-19 DIAGNOSIS — R600 Localized edema: Secondary | ICD-10-CM | POA: Diagnosis not present

## 2019-02-21 ENCOUNTER — Ambulatory Visit (INDEPENDENT_AMBULATORY_CARE_PROVIDER_SITE_OTHER): Payer: Medicare Other | Admitting: *Deleted

## 2019-02-21 ENCOUNTER — Other Ambulatory Visit: Payer: Self-pay

## 2019-02-21 DIAGNOSIS — I4892 Unspecified atrial flutter: Secondary | ICD-10-CM

## 2019-02-21 DIAGNOSIS — I059 Rheumatic mitral valve disease, unspecified: Secondary | ICD-10-CM | POA: Diagnosis not present

## 2019-02-21 DIAGNOSIS — Z9889 Other specified postprocedural states: Secondary | ICD-10-CM | POA: Diagnosis not present

## 2019-02-21 DIAGNOSIS — Z5181 Encounter for therapeutic drug level monitoring: Secondary | ICD-10-CM

## 2019-02-21 DIAGNOSIS — I350 Nonrheumatic aortic (valve) stenosis: Secondary | ICD-10-CM

## 2019-02-21 DIAGNOSIS — Z01812 Encounter for preprocedural laboratory examination: Secondary | ICD-10-CM | POA: Diagnosis not present

## 2019-02-21 DIAGNOSIS — Z23 Encounter for immunization: Secondary | ICD-10-CM | POA: Diagnosis not present

## 2019-02-21 LAB — POCT INR: INR: 2.8 (ref 2.0–3.0)

## 2019-02-21 NOTE — Patient Instructions (Signed)
Continue coumadin 1 tablet daily except 1 1/2 tablets on Tuesdays, Thursdays and Saturdays Recheck in 6 weeks 

## 2019-03-02 ENCOUNTER — Encounter: Payer: Self-pay | Admitting: Cardiovascular Disease

## 2019-03-02 ENCOUNTER — Ambulatory Visit (INDEPENDENT_AMBULATORY_CARE_PROVIDER_SITE_OTHER): Payer: Medicare Other | Admitting: Cardiovascular Disease

## 2019-03-02 ENCOUNTER — Other Ambulatory Visit: Payer: Self-pay

## 2019-03-02 VITALS — BP 98/60 | HR 83 | Ht 71.0 in | Wt 208.0 lb

## 2019-03-02 DIAGNOSIS — Z9889 Other specified postprocedural states: Secondary | ICD-10-CM | POA: Diagnosis not present

## 2019-03-02 DIAGNOSIS — I25708 Atherosclerosis of coronary artery bypass graft(s), unspecified, with other forms of angina pectoris: Secondary | ICD-10-CM | POA: Diagnosis not present

## 2019-03-02 DIAGNOSIS — I442 Atrioventricular block, complete: Secondary | ICD-10-CM

## 2019-03-02 DIAGNOSIS — G4733 Obstructive sleep apnea (adult) (pediatric): Secondary | ICD-10-CM

## 2019-03-02 DIAGNOSIS — I5032 Chronic diastolic (congestive) heart failure: Secondary | ICD-10-CM | POA: Diagnosis not present

## 2019-03-02 DIAGNOSIS — Z9989 Dependence on other enabling machines and devices: Secondary | ICD-10-CM

## 2019-03-02 DIAGNOSIS — Z950 Presence of cardiac pacemaker: Secondary | ICD-10-CM

## 2019-03-02 DIAGNOSIS — I4892 Unspecified atrial flutter: Secondary | ICD-10-CM

## 2019-03-02 DIAGNOSIS — E785 Hyperlipidemia, unspecified: Secondary | ICD-10-CM | POA: Diagnosis not present

## 2019-03-02 DIAGNOSIS — I350 Nonrheumatic aortic (valve) stenosis: Secondary | ICD-10-CM | POA: Diagnosis not present

## 2019-03-02 DIAGNOSIS — I059 Rheumatic mitral valve disease, unspecified: Secondary | ICD-10-CM

## 2019-03-02 MED ORDER — FUROSEMIDE 40 MG PO TABS: 40.0000 mg | ORAL_TABLET | Freq: Two times a day (BID) | ORAL | Status: DC

## 2019-03-02 NOTE — Patient Instructions (Addendum)
Medication Instructions:   Continue the Lasix at 40mg  twice a day.  Stop Amlodipine (Norvasc).   Continue all other medications.    Labwork: none  Testing/Procedures: none  Follow-Up: 2 months   Any Other Special Instructions Will Be Listed Below (If Applicable).  If you need a refill on your cardiac medications before your next appointment, please call your pharmacy.

## 2019-03-02 NOTE — Progress Notes (Signed)
SUBJECTIVE: Christian Rasmussen is a 78 y.o. male with coronary disease and history of CABG, chronic diastolic heart failure, pacemaker, and aortic stenosis.  He underwent TEE on 06/26/2018 which demonstrated mild to moderate aortic valve stenosis. Mitral valve repair noted with moderate mitral stenosis and mild mitral regurgitation. LVEF 50 to 55%. Right ventricular systolic function was moderately reduced with mild right ventricular enlargement. Overall it was felt there were no significant changes since the prior study.  ECG performed today which I personally reviewed demonstrates a paced ventricular rhythm with PVCs.  He it appears rather than taking Lasix 40 mg twice daily he started only taking 40 mg daily.  His wife was unaware of this.  He also stopped using CPAP for a few nights.  His weight climbed to 220 pounds on 02/21/2019.  He brought in a weight and blood pressure log which I reviewed.  He then went and saw his PCP who increase Lasix to 40 mg twice daily, his original dose.  His weight came down to 208 pounds yesterday.  He is feeling much better and denies shortness of breath, leg swelling, orthopnea, paroxysmal nocturnal dyspnea.  He has been wearing compression stockings.  They have been helping to take care of their 85-month-old great granddaughter.  Soc Hx:Alyson Ingles daughter committed suicide on Oct 01, 2016. Her son lives withthe patientand his wife.  They also have a granddaughter, Museum/gallery conservator, who works at [employer].  Review of Systems: As per "subjective", otherwise negative.  No Known Allergies  Current Outpatient Medications  Medication Sig Dispense Refill  . amLODipine (NORVASC) 5 MG tablet Take 1 tablet (5 mg total) by mouth daily. 90 tablet 3  . aspirin EC 81 MG tablet Take 1 tablet (81 mg total) by mouth daily. (Patient taking differently: Take 81 mg by mouth every evening. ) 90 tablet 3  . atorvastatin (LIPITOR) 40 MG tablet Take 40 mg by mouth daily.      . carvedilol (COREG) 12.5 MG tablet Take 1.5 tablets (18.75 mg total) by mouth 2 (two) times daily. 270 tablet 3  . Coenzyme Q10 200 MG capsule Take 200 mg by mouth daily.     . furosemide (LASIX) 40 MG tablet Take 40 mg by mouth 2 (two) times daily. Take 1 tablet (40 mg) by mouth scheduled in the morning, may take an additional dose in the afternoon if needed for ankle swelling.    Marland Kitchen lisinopril (ZESTRIL) 5 MG tablet Take 1 tablet (5 mg total) by mouth daily. 90 tablet 3  . metFORMIN (GLUCOPHAGE) 500 MG tablet Take 1,000 mg by mouth 2 (two) times daily with a meal.   2  . potassium chloride (KLOR-CON 10) 10 MEQ tablet Take 1 tablet (10 mEq total) by mouth 2 (two) times daily. 180 tablet 3  . RELION PRIME TEST test strip USE 1 STRIP TO CHECK GLUCOSE ONCE DAILY    . SF 5000 PLUS 1.1 % CREA dental cream Apply 1 application topically See admin instructions. Use 1 application 2 times a week at bedtime.    Marland Kitchen warfarin (COUMADIN) 5 MG tablet Take 1 tablet daily except 1 1/2 tablets on Tuesdays, Thursdays and Saturdays 45 tablet 6  . ASTAXANTHIN PO Take 10 mg by mouth daily.      No current facility-administered medications for this visit.     Past Medical History:  Diagnosis Date  . 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 Aortic stenosis 10/14/2015  .  CAD (coronary artery disease)    status post Taxus stent patency of RCA 2004, Cardiolite negative for ischemia in 2010.  . Diabetes mellitus ([place])   . Dyslipidemia   . History of kidney stones   . Hypertension   . Iron deficiency anemia 02/02/2018  . Mitral regurgitation 10/15/2015   Moderate-severe by intra-operative TEE  . Overweight   . Right bundle branch block (RBBB) with left anterior hemiblock   . 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 Snores   . Typical atrial flutter [facility])    s/p ablation 11-23-2012 by Dr Rayann Heman    Past Surgical History:  Procedure Laterality Date  . ABLATION  11-23-2012   s/p cavotricuspid isthmus ablation by Dr Rayann Heman  . ANKLE SURGERY Left    total ankle replacement  . ATRIAL FLUTTER ABLATION N/A 11/23/2012   Procedure: ATRIAL FLUTTER ABLATION;  Surgeon: Thompson Grayer, MD;  Location: [facility] CATH LAB;  Service: Cardiovascular;  Laterality: N/A;  . CARDIAC CATHETERIZATION N/A 10/13/2015   Procedure: Left Heart Cath and Coronary Angiography;  Surgeon: Peter M Martinique, MD;  Location: [place] CV LAB;  Service: Cardiovascular;  Laterality: N/A;  . CATARACT EXTRACTION Right   . CATARACT EXTRACTION W/PHACO Left 09/13/2013   Procedure: CATARACT EXTRACTION PHACO AND INTRAOCULAR LENS PLACEMENT (IOC);  Surgeon: Tonny Branch, MD;  Location: [facility] ORS;  Service: Ophthalmology;  Laterality: Left;  CDE 9.38  . COLONOSCOPY N/A 10/10/2014   Procedure: COLONOSCOPY;  Surgeon: Rogene Houston, MD;  [facility] ENDO SUITE;  Service: Endoscopy;  Laterality: N/A;  830  . CORONARY ANGIOPLASTY     3 stents  . CORONARY ARTERY BYPASS GRAFT N/A 10/15/2015   Procedure: CORONARY ARTERY BYPASS GRAFTING (CABG) X4 LIMA-LAD; SEQ SVG-PD-PL; SVG-OM1 ENDOSCOPIC GREATER SAPHENOUS VEIN HARVEST(EVH) BILAT LOWER EXTREM;  Surgeon: Rexene Alberts, MD;  Location: [place];  Service: Open Heart Surgery;  Laterality: N/A;  . EP IMPLANTABLE DEVICE N/A 10/20/2015   Procedure: Pacemaker Implant;  Surgeon: Evans Lance, MD;  Location: [place] CV LAB;  Service: Cardiovascular;  Laterality: N/A;  . MITRAL VALVE REPAIR N/A 10/15/2015   Procedure: MITRAL VALVE REPAIR (MVR), # 28 MEMO 3-D RING ANNULOPLASTY AND COMPLEX VALVE REPAIR;  Surgeon: Rexene Alberts, MD;  Location: [place];  Service: Open Heart Surgery;  Laterality: N/A;  . TEE WITHOUT  CARDIOVERSION N/A 10/15/2015   Procedure: TRANSESOPHAGEAL ECHOCARDIOGRAM (TEE);  Surgeon: Rexene Alberts, MD;  Location: [place];  Service: Open Heart Surgery;  Laterality: N/A;  . TEE WITHOUT CARDIOVERSION N/A 09/16/2017   Procedure: TRANSESOPHAGEAL ECHOCARDIOGRAM (TEE);  Surgeon: Dorothy Spark, MD;  Location: [facility] ENDOSCOPY;  Service: Cardiovascular;  Laterality: N/A;  . TEE WITHOUT CARDIOVERSION N/A 06/26/2018   Procedure: TRANSESOPHAGEAL ECHOCARDIOGRAM (TEE);  Surgeon: [name], MD;  Location: [place];  Service: Cardiology;  Laterality: N/A;    Social History   Socioeconomic History  . Marital status: Married    Spouse name: Not on file  . Number of children: 1  . Years of education: Not on file  . Highest education level: Not on file  Occupational History  . 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 Financial resource strain:  Not on file  . Food insecurity    Worry: Not on file    Inability: Not on file  . Transportation needs    Medical: Not on file    Non-medical: Not on file  Tobacco Use  . Smoking status: Former Smoker    Packs/day: 1.00    Years: 28.00    Pack years: 28.00    Types: Cigarettes    Start date: 10/21/1956    Quit date: 05/10/1985    Years since quitting: 33.8  . Smokeless tobacco: Never Used  Substance and Sexual Activity  . Alcohol use: Not Currently    Alcohol/week: 0.0 standard drinks    Comment: occasional wine  . Drug use: No  . Sexual activity: Yes    Birth control/protection: None  Lifestyle  . Physical activity    Days per week: Not on file    Minutes per session: Not on file  . Stress: Not on file  Relationships  . Social Herbalist on phone: Not on file    Gets together: Not on file    Attends religious service: Not on file    Active member of club or organization: Not on file    Attends meetings of clubs or organizations: Not on file    Relationship status: Not on file  . Intimate  partner violence    Fear of current or ex partner: Not on file    Emotionally abused: Not on file    Physically abused: Not on file    Forced sexual activity: Not on file  Other Topics Concern  . Not on file  Social History Narrative   Lives in [place] with spouse.   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 Neck: No JVD, no thyromegaly. Lungs: Clear to auscultation bilaterally with normal respiratory effort. CV: Regular rate and rhythm, normal S1/S2, no S3/S4, no murmur. No pretibial or periankle edema.   Abdomen: Soft, nontender, no distention.  Neurologic: Alert and oriented.  Psych: Normal affect. Skin: Normal. Musculoskeletal: No gross deformities.      Labs: Lab Results  Component Value Date/Time   K 5.3 (H) 08/22/2018 09:02 AM   BUN 32 (H) 08/22/2018 09:02 AM   BUN 21 09/12/2017 09:58 AM   CREATININE 1.17 08/22/2018 09:02 AM   ALT 17 08/22/2018 09:02 AM   TSH 4.924 (H) 03/18/2016 09:18 PM   HGB 12.6 (L) 12/21/2018 08:03 AM   HGB 12.9 (L) 09/12/2017 09:58 AM     Lipids: Lab Results  Component Value Date/Time   LDLCALC 43 10/14/2015 04:31 AM   CHOL 108 10/14/2015 04:31 AM   TRIG 157 (H) 10/14/2015 04:31 AM   HDL 34 (L) 10/14/2015 04:31 AM       ASSESSMENT AND PLAN:  1. CAD s/p 4-v CABG(June 2017):Symptomatically stable.Continue aspirin, Lipitor, Coreg, and lisinopril.   2. S/p mitral valve repair: Moderatestenosis and mild regurgitation by TEE in February 2020. I will monitor.  3. S/p PPM for CHB: Stable.Device is functioning normally. F/u with Dr. Lovena Le.  4. Essential HTN: Blood pressure is low normal.  I  will discontinue amlodipine.  5.  Hyperlipidemia:ContinueLipitor.LDL 65 on 05/09/2018.  6. Chronic diastolic heart failure, 99991111: Euvolemic. Continue Lasix  40 mg twice daily.  The importance of weighing daily and taking medications as prescribed was strongly encouraged. Should he develop leg swelling, shortness of breath, or weight gain of 3 pounds within 24 hours, hecan take an extra 40 mg.Continue carvedilol and lisinopril.  7. Atrial flutter:Anticoagulated withwarfarin.  8. Aortic stenosis and regurgitation:Mild to moderate aortic stenosis by TEE in February 2020. I will monitor.  9.  Obstructive sleep apnea: Encouraged to use CPAP daily.   Disposition: Follow up 2 months   Kate Sable, M.D., F.A.C.C.

## 2019-03-02 NOTE — Addendum Note (Signed)
Addended by: [name] on: 03/02/2019 08:50 AM   Modules accepted: Orders

## 2019-03-06 ENCOUNTER — Encounter: Payer: Self-pay | Admitting: *Deleted

## 2019-03-07 DIAGNOSIS — E1151 Type 2 diabetes mellitus with diabetic peripheral angiopathy without gangrene: Secondary | ICD-10-CM | POA: Diagnosis not present

## 2019-03-07 DIAGNOSIS — I509 Heart failure, unspecified: Secondary | ICD-10-CM | POA: Diagnosis not present

## 2019-03-07 DIAGNOSIS — I10 Essential (primary) hypertension: Secondary | ICD-10-CM | POA: Diagnosis not present

## 2019-03-07 DIAGNOSIS — Z299 Encounter for prophylactic measures, unspecified: Secondary | ICD-10-CM | POA: Diagnosis not present

## 2019-03-07 DIAGNOSIS — Z6829 Body mass index (BMI) 29.0-29.9, adult: Secondary | ICD-10-CM | POA: Diagnosis not present

## 2019-03-07 DIAGNOSIS — I4892 Unspecified atrial flutter: Secondary | ICD-10-CM | POA: Diagnosis not present

## 2019-03-12 ENCOUNTER — Ambulatory Visit: Payer: Self-pay | Admitting: Surgery

## 2019-03-12 DIAGNOSIS — R1032 Left lower quadrant pain: Secondary | ICD-10-CM | POA: Diagnosis not present

## 2019-04-03 DIAGNOSIS — Z299 Encounter for prophylactic measures, unspecified: Secondary | ICD-10-CM | POA: Diagnosis not present

## 2019-04-03 DIAGNOSIS — Z6829 Body mass index (BMI) 29.0-29.9, adult: Secondary | ICD-10-CM | POA: Diagnosis not present

## 2019-04-03 DIAGNOSIS — I509 Heart failure, unspecified: Secondary | ICD-10-CM | POA: Diagnosis not present

## 2019-04-03 DIAGNOSIS — E1165 Type 2 diabetes mellitus with hyperglycemia: Secondary | ICD-10-CM | POA: Diagnosis not present

## 2019-04-03 DIAGNOSIS — E1151 Type 2 diabetes mellitus with diabetic peripheral angiopathy without gangrene: Secondary | ICD-10-CM | POA: Diagnosis not present

## 2019-04-03 DIAGNOSIS — I10 Essential (primary) hypertension: Secondary | ICD-10-CM | POA: Diagnosis not present

## 2019-04-04 ENCOUNTER — Other Ambulatory Visit: Payer: Self-pay

## 2019-04-04 ENCOUNTER — Ambulatory Visit (INDEPENDENT_AMBULATORY_CARE_PROVIDER_SITE_OTHER): Payer: Medicare Other | Admitting: *Deleted

## 2019-04-04 DIAGNOSIS — Z01812 Encounter for preprocedural laboratory examination: Secondary | ICD-10-CM | POA: Diagnosis not present

## 2019-04-04 DIAGNOSIS — Z9889 Other specified postprocedural states: Secondary | ICD-10-CM

## 2019-04-04 DIAGNOSIS — I350 Nonrheumatic aortic (valve) stenosis: Secondary | ICD-10-CM | POA: Diagnosis not present

## 2019-04-04 DIAGNOSIS — I4892 Unspecified atrial flutter: Secondary | ICD-10-CM | POA: Diagnosis not present

## 2019-04-04 DIAGNOSIS — Z5181 Encounter for therapeutic drug level monitoring: Secondary | ICD-10-CM

## 2019-04-04 DIAGNOSIS — I059 Rheumatic mitral valve disease, unspecified: Secondary | ICD-10-CM | POA: Diagnosis not present

## 2019-04-04 LAB — POCT INR: INR: 2.4 (ref 2.0–3.0)

## 2019-04-04 NOTE — Patient Instructions (Signed)
Continue coumadin 1 tablet daily except 1 1/2 tablets on Tuesdays, Thursdays and Saturdays Recheck in 6 weeks 

## 2019-04-23 ENCOUNTER — Other Ambulatory Visit: Payer: Self-pay

## 2019-04-23 ENCOUNTER — Inpatient Hospital Stay (HOSPITAL_COMMUNITY): Payer: Medicare Other | Attending: Hematology

## 2019-04-23 ENCOUNTER — Ambulatory Visit (INDEPENDENT_AMBULATORY_CARE_PROVIDER_SITE_OTHER): Payer: Medicare Other | Admitting: *Deleted

## 2019-04-23 DIAGNOSIS — E119 Type 2 diabetes mellitus without complications: Secondary | ICD-10-CM | POA: Insufficient documentation

## 2019-04-23 DIAGNOSIS — R911 Solitary pulmonary nodule: Secondary | ICD-10-CM | POA: Diagnosis not present

## 2019-04-23 DIAGNOSIS — E785 Hyperlipidemia, unspecified: Secondary | ICD-10-CM | POA: Diagnosis not present

## 2019-04-23 DIAGNOSIS — Z950 Presence of cardiac pacemaker: Secondary | ICD-10-CM

## 2019-04-23 DIAGNOSIS — Z7982 Long term (current) use of aspirin: Secondary | ICD-10-CM | POA: Insufficient documentation

## 2019-04-23 DIAGNOSIS — Z951 Presence of aortocoronary bypass graft: Secondary | ICD-10-CM | POA: Insufficient documentation

## 2019-04-23 DIAGNOSIS — J984 Other disorders of lung: Secondary | ICD-10-CM | POA: Insufficient documentation

## 2019-04-23 DIAGNOSIS — Z87891 Personal history of nicotine dependence: Secondary | ICD-10-CM | POA: Insufficient documentation

## 2019-04-23 DIAGNOSIS — R0602 Shortness of breath: Secondary | ICD-10-CM | POA: Insufficient documentation

## 2019-04-23 DIAGNOSIS — Z79899 Other long term (current) drug therapy: Secondary | ICD-10-CM | POA: Insufficient documentation

## 2019-04-23 DIAGNOSIS — D509 Iron deficiency anemia, unspecified: Secondary | ICD-10-CM | POA: Diagnosis not present

## 2019-04-23 DIAGNOSIS — I10 Essential (primary) hypertension: Secondary | ICD-10-CM | POA: Diagnosis not present

## 2019-04-23 DIAGNOSIS — I2510 Atherosclerotic heart disease of native coronary artery without angina pectoris: Secondary | ICD-10-CM | POA: Diagnosis not present

## 2019-04-23 DIAGNOSIS — D508 Other iron deficiency anemias: Secondary | ICD-10-CM

## 2019-04-23 LAB — CBC WITH DIFFERENTIAL/PLATELET
Abs Immature Granulocytes: 0.03 10*3/uL (ref 0.00–0.07)
Basophils Absolute: 0.1 10*3/uL (ref 0.0–0.1)
Basophils Relative: 1 %
Eosinophils Absolute: 0.1 10*3/uL (ref 0.0–0.5)
Eosinophils Relative: 1 %
HCT: 41.1 % (ref 39.0–52.0)
Hemoglobin: 12.8 g/dL — ABNORMAL LOW (ref 13.0–17.0)
Immature Granulocytes: 0 %
Lymphocytes Relative: 10 %
Lymphs Abs: 0.8 10*3/uL (ref 0.7–4.0)
MCH: 29.6 pg (ref 26.0–34.0)
MCHC: 31.1 g/dL (ref 30.0–36.0)
MCV: 94.9 fL (ref 80.0–100.0)
Monocytes Absolute: 0.8 10*3/uL (ref 0.1–1.0)
Monocytes Relative: 10 %
Neutro Abs: 6.3 10*3/uL (ref 1.7–7.7)
Neutrophils Relative %: 78 %
Platelets: 243 10*3/uL (ref 150–400)
RBC: 4.33 MIL/uL (ref 4.22–5.81)
RDW: 18.7 % — ABNORMAL HIGH (ref 11.5–15.5)
WBC: 8.1 10*3/uL (ref 4.0–10.5)
nRBC: 0 % (ref 0.0–0.2)

## 2019-04-23 LAB — CUP PACEART REMOTE DEVICE CHECK
Date Time Interrogation Session: 20201214132011
Implantable Lead Implant Date: 20170612
Implantable Lead Implant Date: 20170612
Implantable Lead Location: 753859
Implantable Lead Location: 753860
Implantable Lead Model: 7741
Implantable Lead Model: 7742
Implantable Lead Serial Number: 733074
Implantable Lead Serial Number: 760165
Implantable Pulse Generator Implant Date: 20170612
Pulse Gen Serial Number: 724121

## 2019-04-23 LAB — COMPREHENSIVE METABOLIC PANEL
ALT: 16 U/L (ref 0–44)
AST: 18 U/L (ref 15–41)
Albumin: 4 g/dL (ref 3.5–5.0)
Alkaline Phosphatase: 130 U/L — ABNORMAL HIGH (ref 38–126)
Anion gap: 12 (ref 5–15)
BUN: 39 mg/dL — ABNORMAL HIGH (ref 8–23)
CO2: 28 mmol/L (ref 22–32)
Calcium: 9.9 mg/dL (ref 8.9–10.3)
Chloride: 103 mmol/L (ref 98–111)
Creatinine, Ser: 1.38 mg/dL — ABNORMAL HIGH (ref 0.61–1.24)
GFR calc Af Amer: 57 mL/min — ABNORMAL LOW (ref >60)
GFR calc non Af Amer: 49 mL/min — ABNORMAL LOW (ref >60)
Glucose, Bld: 108 mg/dL — ABNORMAL HIGH (ref 70–99)
Potassium: 4.4 mmol/L (ref 3.5–5.1)
Sodium: 143 mmol/L (ref 135–145)
Total Bilirubin: 0.9 mg/dL (ref 0.3–1.2)
Total Protein: 7.8 g/dL (ref 6.5–8.1)

## 2019-04-23 LAB — IRON AND TIBC
Iron: 56 ug/dL (ref 45–182)
Saturation Ratios: 15 % — ABNORMAL LOW (ref 17.9–39.5)
TIBC: 383 ug/dL (ref 250–450)
UIBC: 327 ug/dL

## 2019-04-23 LAB — LACTATE DEHYDROGENASE: LDH: 170 U/L (ref 98–192)

## 2019-04-23 LAB — VITAMIN B12: Vitamin B-12: 1487 pg/mL — ABNORMAL HIGH (ref 180–914)

## 2019-04-23 LAB — VITAMIN D 25 HYDROXY (VIT D DEFICIENCY, FRACTURES): Vit D, 25-Hydroxy: 34.1 ng/mL (ref 30–100)

## 2019-04-23 LAB — FERRITIN: Ferritin: 99 ng/mL (ref 24–336)

## 2019-04-30 ENCOUNTER — Other Ambulatory Visit: Payer: Self-pay

## 2019-04-30 ENCOUNTER — Telehealth: Payer: Self-pay | Admitting: Cardiovascular Disease

## 2019-04-30 ENCOUNTER — Inpatient Hospital Stay (HOSPITAL_BASED_OUTPATIENT_CLINIC_OR_DEPARTMENT_OTHER): Payer: Medicare Other | Admitting: Nurse Practitioner

## 2019-04-30 DIAGNOSIS — R918 Other nonspecific abnormal finding of lung field: Secondary | ICD-10-CM | POA: Diagnosis not present

## 2019-04-30 DIAGNOSIS — D508 Other iron deficiency anemias: Secondary | ICD-10-CM | POA: Diagnosis not present

## 2019-04-30 DIAGNOSIS — R911 Solitary pulmonary nodule: Secondary | ICD-10-CM | POA: Diagnosis not present

## 2019-04-30 NOTE — Telephone Encounter (Signed)
I agree with your recommendations.  Bending over likely impeding venous return leading to symptoms.

## 2019-04-30 NOTE — Addendum Note (Signed)
Addended by: Glennie Isle on: 04/30/2019 09:08 AM   Modules accepted: Orders

## 2019-04-30 NOTE — Telephone Encounter (Signed)
Patient called requesting to speak with Dr. Bronson Ing. States that when he bends over he starts feeling dizzy.  He is wondering if it could be his BP medication.

## 2019-04-30 NOTE — Telephone Encounter (Signed)
Patient reports that for the last 3-4 months he experiences dizziness as he bends over to pick up things in driveway. His bp is good , running 108/75-121/80. Sx's only appear when he bends over. I encouraged him to squat bvs bending over.

## 2019-04-30 NOTE — Assessment & Plan Note (Signed)
1.  Iron deficiency anemia: -Last Injectafer was on 02/10/2018. -He has tried oral iron therapy in the past without response. -Denies any bright red bleeding per rectum or melena.  He does have shortness of breath on exertion which is stable. -Labs done on 04/13/2019 showed hemoglobin 12.8, ferritin 99, percent saturation 15. -He does not require any parenteral iron therapy at this time. -He will follow-up in 4 months with repeat labs.  2.  Right lung nodule: -CT of the chest with contrast dated 08/22/2018 showed right middle lobe lung nodule including 4 mm nodule has been unchanged, can be preserved benign. -Posterior lateral left upper lobe pleural-based soft tissue density on the order of 2 cm is favored to represent an area of rounded atelectasis or scarring.  This is also unchanged from April 2019. -We will set him up with a follow-up nodule scan April 2021.

## 2019-04-30 NOTE — Telephone Encounter (Signed)
Patient will avoid bending over in the future

## 2019-04-30 NOTE — Addendum Note (Signed)
Addended by: Glennie Isle on: 04/30/2019 09:11 AM   Modules accepted: Orders

## 2019-04-30 NOTE — Progress Notes (Signed)
[address], [place] [address]   CLINIC:  Medical Oncology/Hematology  PCP:  Monico Blitz, MD [place] [id number] [phone]   REASON FOR VISIT: Follow-up for iron deficiency anemia  CURRENT THERAPY: Intermittent iron infusions   INTERVAL HISTORY:  Mr. Quickle 78 y.o. male was called for a telephone interview for iron deficiency anemia.  Patient report he has been doing well since his last visit.  He denies any bright red bleeding per rectum or melena.  He denies any easy bruising or bleeding. Denies any nausea, vomiting, or diarrhea. Denies any new pains. Had not noticed any recent bleeding such as epistaxis, hematuria or hematochezia. Denies recent chest pain on exertion, shortness of breath on minimal exertion, pre-syncopal episodes, or palpitations. Denies any numbness or tingling in hands or feet. Denies any recent fevers, infections, or recent hospitalizations. Patient reports appetite at 100% and energy level at 75%.  He is eating well maintain his weight at this time.    REVIEW OF SYSTEMS:  Review of Systems  All other systems reviewed and are negative.    PAST MEDICAL/SURGICAL HISTORY:  Past Medical History:  Diagnosis Date  . 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 Aortic stenosis 10/14/2015  . CAD (coronary artery disease)    status post Taxus stent patency of RCA 2004, Cardiolite negative for ischemia in 2010.  . Diabetes mellitus ([place])   . Dyslipidemia   . History of kidney stones   . Hypertension   . Iron deficiency anemia 02/02/2018  . Mitral regurgitation 10/15/2015   Moderate-severe by intra-operative TEE  . Overweight   . Right bundle branch block (RBBB) with left anterior hemiblock   . 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 Snores   . Typical atrial flutter [facility])    s/p ablation 11-23-2012 by Dr Rayann Heman   Past Surgical History:  Procedure Laterality Date  . ABLATION  11-23-2012   s/p cavotricuspid isthmus ablation by Dr Rayann Heman  . ANKLE SURGERY Left    total ankle replacement  . ATRIAL FLUTTER ABLATION N/A 11/23/2012   Procedure: ATRIAL FLUTTER ABLATION;  Surgeon: Thompson Grayer, MD;  Location: [facility] CATH LAB;  Service: Cardiovascular;  Laterality: N/A;  . CARDIAC CATHETERIZATION N/A 10/13/2015   Procedure: Left Heart Cath and Coronary Angiography;  Surgeon: Peter M Martinique, MD;  Location: [facility] CV LAB;  Service: Cardiovascular;  Laterality: N/A;  . CATARACT EXTRACTION Right   . CATARACT EXTRACTION W/PHACO Left 09/13/2013   Procedure: CATARACT EXTRACTION PHACO AND INTRAOCULAR LENS PLACEMENT (IOC);  Surgeon: Tonny Branch, MD;  Location: [facility] ORS;  Service: Ophthalmology;  Laterality: Left;  CDE 9.38  . COLONOSCOPY N/A 10/10/2014   Procedure: COLONOSCOPY;  Surgeon: Rogene Houston, MD;  Location: [facility] ENDO SUITE;  Service: Endoscopy;  Laterality: N/A;  830  . CORONARY ANGIOPLASTY     3 stents  . CORONARY ARTERY BYPASS GRAFT N/A 10/15/2015   Procedure: CORONARY ARTERY BYPASS GRAFTING (CABG) X4 LIMA-LAD; SEQ SVG-PD-PL; SVG-OM1 ENDOSCOPIC GREATER SAPHENOUS VEIN HARVEST(EVH) BILAT LOWER EXTREM;  Surgeon: Rexene Alberts, MD;  Location: [facility];  Service: Open Heart Surgery;  Laterality: N/A;  .  EP IMPLANTABLE DEVICE N/A 10/20/2015   Procedure: Pacemaker Implant;  Surgeon: Evans Lance, MD;  Location: [facility] CV LAB;  Service: Cardiovascular;  Laterality: N/A;  . MITRAL VALVE REPAIR N/A 10/15/2015   Procedure: MITRAL VALVE REPAIR (MVR), # 28 MEMO 3-D RING ANNULOPLASTY AND COMPLEX VALVE REPAIR;  Surgeon: Rexene Alberts, MD;  Location: [facility];  Service: Open Heart Surgery;  Laterality: N/A;    . TEE WITHOUT CARDIOVERSION N/A 10/15/2015   Procedure: TRANSESOPHAGEAL ECHOCARDIOGRAM (TEE);  Surgeon: Rexene Alberts, MD;  Location: [facility];  Service: Open Heart Surgery;  Laterality: N/A;  . TEE WITHOUT CARDIOVERSION N/A 09/16/2017   Procedure: TRANSESOPHAGEAL ECHOCARDIOGRAM (TEE);  Surgeon: Dorothy Spark, MD;  Location: [facility] ENDOSCOPY;  Service: Cardiovascular;  Laterality: N/A;  . TEE WITHOUT CARDIOVERSION N/A 06/26/2018   Procedure: TRANSESOPHAGEAL ECHOCARDIOGRAM (TEE);  Surgeon: [name], MD;  Location: [facility];  Service: Cardiology;  Laterality: N/A;     SOCIAL HISTORY:  Social History   Socioeconomic History  . Marital status: Married    Spouse name: Not on file  . Number of children: 1  . Years of education: Not on file  . Highest education level: Not on file  Occupational History  . 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 Smoking status: Former Smoker    Packs/day: 1.00    Years: 28.00    Pack years: 28.00    Types: Cigarettes    Start date: 10/21/1956    Quit date: 05/10/1985    Years since quitting: 33.9  . Smokeless tobacco: Never Used  Substance and Sexual Activity  . Alcohol use: Not Currently    Alcohol/week: 0.0 standard drinks    Comment: occasional wine  . Drug use: No  . Sexual activity: Yes    Birth control/protection: None  Other Topics Concern  . Not on file  Social History Narrative   Lives in [place] with spouse.   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 Difficulty of Paying Living Expenses: Not on file  Food Insecurity:   . Worried About Charity fundraiser in the Last Year: Not on file  . Ran Out of Food in the Last Year: Not on file  Transportation Needs:   . Lack of Transportation (Medical): Not on file  . Lack of Transportation (Non-Medical): Not on file  Physical Activity:   . Days of Exercise per Week: Not on file  . Minutes of Exercise per  Session: Not on file  Stress:   . Feeling of Stress : Not on file  Social Connections:   . Frequency of Communication with Friends and Family: Not on file  . Frequency of Social Gatherings with Friends and Family: Not on file  . Attends Religious Services: Not on file  . Active Member of Clubs or Organizations: Not on file  . Attends Archivist Meetings: Not on file  . Marital Status: Not on file  Intimate Partner Violence:   . Fear of Current or Ex-Partner: Not on file  . Emotionally Abused: Not on file  . Physically Abused: Not on file  . Sexually Abused: Not on file    FAMILY HISTORY:  Family History  Problem Relation Age of Onset  . Other Father 27       Died from MI.  . Other Mother        alive & well  . Leukemia Brother 72  died    CURRENT MEDICATIONS:  Outpatient Encounter Medications as of 04/30/2019  Medication Sig  . amLODipine (NORVASC) 5 MG tablet Take 5 mg by mouth once.  Marland Kitchen aspirin EC 81 MG tablet Take 1 tablet (81 mg total) by mouth daily. (Patient taking differently: Take 81 mg by mouth every evening. )  . ASTAXANTHIN PO Take 10 mg by mouth daily.   Marland Kitchen atorvastatin (LIPITOR) 40 MG tablet Take 40 mg by mouth daily.   . carvedilol (COREG) 12.5 MG tablet Take 1.5 tablets (18.75 mg total) by mouth 2 (two) times daily.  . Coenzyme Q10 200 MG capsule Take 200 mg by mouth daily.   . furosemide (LASIX) 40 MG tablet Take 1 tablet (40 mg total) by mouth 2 (two) times daily.  Marland Kitchen lisinopril (ZESTRIL) 5 MG tablet Take 1 tablet (5 mg total) by mouth daily.  . metFORMIN (GLUCOPHAGE) 500 MG tablet Take 1,000 mg by mouth 2 (two) times daily with a meal.   . potassium chloride (KLOR-CON 10) 10 MEQ tablet Take 1 tablet (10 mEq total) by mouth 2 (two) times daily.  Marland Kitchen RELION PRIME TEST test strip USE 1 STRIP TO CHECK GLUCOSE ONCE DAILY  . SF 5000 PLUS 1.1 % CREA dental cream Apply 1 application topically See admin instructions. Use 1 application 2 times a week at  bedtime.  Marland Kitchen warfarin (COUMADIN) 5 MG tablet Take 1 tablet daily except 1 1/2 tablets on Tuesdays, Thursdays and Saturdays   No facility-administered encounter medications on file as of 04/30/2019.    ALLERGIES:  No Known Allergies   Vital signs: -Deferred due to telephone visit  Physical Exam -Deferred due to telephone visit -Patient was alert and oriented over the phone and in no acute distress.  LABORATORY DATA:  I have reviewed the labs as listed.  CBC    Component Value Date/Time   WBC 8.1 04/23/2019 1235   RBC 4.33 04/23/2019 1235   HGB 12.8 (L) 04/23/2019 1235   HGB 12.9 (L) 09/12/2017 0958   HCT 41.1 04/23/2019 1235   HCT 38.2 09/12/2017 0958   PLT 243 04/23/2019 1235   PLT 323 09/12/2017 0958   MCV 94.9 04/23/2019 1235   MCV 87 09/12/2017 0958   MCH 29.6 04/23/2019 1235   MCHC 31.1 04/23/2019 1235   RDW 18.7 (H) 04/23/2019 1235   RDW 16.1 (H) 09/12/2017 0958   LYMPHSABS 0.8 04/23/2019 1235   MONOABS 0.8 04/23/2019 1235   EOSABS 0.1 04/23/2019 1235   BASOSABS 0.1 04/23/2019 1235   CMP Latest Ref Rng & Units 04/23/2019 08/22/2018 04/11/2018  Glucose 70 - 99 mg/dL 108(H) 126(H) 89  BUN 8 - 23 mg/dL 39(H) 32(H) 29(H)  Creatinine 0.61 - 1.24 mg/dL 1.38(H) 1.17 1.12  Sodium 135 - 145 mmol/L 143 138 139  Potassium 3.5 - 5.1 mmol/L 4.4 5.3(H) 5.0  Chloride 98 - 111 mmol/L 103 104 107  CO2 22 - 32 mmol/L 28 24 23   Calcium 8.9 - 10.3 mg/dL 9.9 9.5 9.6  Total Protein 6.5 - 8.1 g/dL 7.8 7.4 7.8  Total Bilirubin 0.3 - 1.2 mg/dL 0.9 1.1 0.8  Alkaline Phos 38 - 126 U/L 130(H) 91 84  AST 15 - 41 U/L 18 15 15   ALT 0 - 44 U/L 16 17 14     All questions were answered to patient's stated satisfaction. Encouraged patient to call with any new concerns or questions before his next visit to the cancer center and we can certain see him sooner, if  needed.      ASSESSMENT & PLAN:   Iron deficiency anemia 1.  Iron deficiency anemia: -Last Injectafer was on 02/10/2018. -He  has tried oral iron therapy in the past without response. -Denies any bright red bleeding per rectum or melena.  He does have shortness of breath on exertion which is stable. -Labs done on 04/13/2019 showed hemoglobin 12.8, ferritin 99, percent saturation 15. -He does not require any parenteral iron therapy at this time. -He will follow-up in 4 months with repeat labs.  2.  Right lung nodule: -CT of the chest with contrast dated 08/22/2018 showed right middle lobe lung nodule including 4 mm nodule has been unchanged, can be preserved benign. -Posterior lateral left upper lobe pleural-based soft tissue density on the order of 2 cm is favored to represent an area of rounded atelectasis or scarring.  This is also unchanged from April 2019. -We will set him up with a follow-up nodule scan April 2021.      Orders placed this encounter:  Orders Placed This Encounter  Procedures  . CT CHEST NODULE FOLLOW UP LOW DOSE WO CM  . Lactate dehydrogenase  . CBC with Differential/Platelet  . Comprehensive metabolic panel  . Ferritin  . Iron and TIBC  . Vitamin B12  . Vitamin D 25 hydroxy  . Folate      Francene Finders, FNP-C [place] [phone]

## 2019-05-02 ENCOUNTER — Telehealth (INDEPENDENT_AMBULATORY_CARE_PROVIDER_SITE_OTHER): Payer: Medicare Other | Admitting: Family Medicine

## 2019-05-02 ENCOUNTER — Encounter: Payer: Self-pay | Admitting: Cardiovascular Disease

## 2019-05-02 VITALS — BP 117/61 | HR 62 | Ht 71.0 in | Wt 207.0 lb

## 2019-05-02 DIAGNOSIS — I350 Nonrheumatic aortic (valve) stenosis: Secondary | ICD-10-CM

## 2019-05-02 DIAGNOSIS — I2510 Atherosclerotic heart disease of native coronary artery without angina pectoris: Secondary | ICD-10-CM | POA: Diagnosis not present

## 2019-05-02 DIAGNOSIS — Z950 Presence of cardiac pacemaker: Secondary | ICD-10-CM | POA: Diagnosis not present

## 2019-05-02 DIAGNOSIS — I10 Essential (primary) hypertension: Secondary | ICD-10-CM

## 2019-05-02 DIAGNOSIS — Z9889 Other specified postprocedural states: Secondary | ICD-10-CM

## 2019-05-02 DIAGNOSIS — I5032 Chronic diastolic (congestive) heart failure: Secondary | ICD-10-CM

## 2019-05-02 DIAGNOSIS — I059 Rheumatic mitral valve disease, unspecified: Secondary | ICD-10-CM

## 2019-05-02 DIAGNOSIS — I2583 Coronary atherosclerosis due to lipid rich plaque: Secondary | ICD-10-CM

## 2019-05-02 DIAGNOSIS — E785 Hyperlipidemia, unspecified: Secondary | ICD-10-CM

## 2019-05-02 NOTE — Patient Instructions (Signed)
Medication Instructions:  Your physician recommends that you continue on your current medications as directed. Please refer to the Current Medication list given to you today.  *If you need a refill on your cardiac medications before your next appointment, please call your pharmacy*  Lab Work: None today If you have labs (blood work) drawn today and your tests are completely normal, you will receive your results only by: Marland Kitchen MyChart Message (if you have MyChart) OR . A paper copy in the mail If you have any lab test that is abnormal or we need to change your treatment, we will call you to review the results.  Testing/Procedures: None today  Follow-Up: At [facility], you and your health needs are our priority.  As part of our continuing mission to provide you with exceptional heart care, we have created designated Provider Care Teams.  These Care Teams include your primary Cardiologist (physician) and Advanced Practice Providers (APPs -  Physician Assistants and Nurse Practitioners) who all work together to provide you with the care you need, when you need it.  Your next appointment:   6 month(s)  The format for your next appointment:   Either In Person or Virtual  Provider:   You may see Kate Sable, MD or the following Advanced Practice Provider on your designated Care Team:    Katina Dung, NP   Other Instructions None      Thank you for choosing [facility] !

## 2019-05-02 NOTE — Progress Notes (Signed)
Virtual Visit via Telephone Note   This visit type was conducted due to national recommendations for restrictions regarding the COVID-19 Pandemic (e.g. social distancing) in an effort to limit this patient's exposure and mitigate transmission in our community.  Due to his co-morbid illnesses, this patient is at least at moderate risk for complications without adequate follow up.  This format is felt to be most appropriate for this patient at this time.  The patient did not have access to video technology/had technical difficulties with video requiring transitioning to audio format only (telephone).  All issues noted in this document were discussed and addressed.  No physical exam could be performed with this format.  Please refer to the patient's chart for his  consent to telehealth for [facility].   Date:  05/02/2019   ID:  Christian Rasmussen, DOB 04-08-1941, MRN [id number]  Patient Location: Home Provider Location: Office  PCP:  Monico Blitz, MD  Cardiologist:  Kate Sable, MD  Electrophysiologist:  None   Evaluation Performed:  Follow-Up Visit  Chief Complaint: Follow-up for coronary artery disease, chronic diastolic heart failure, pacemaker and aortic stenosis.  History of Present Illness:    Christian Rasmussen is a 78 y.o. male with last seen March 02, 2019 by Dr. Bronson Ing.  History of coronary artery disease and CABG.  Chronic diastolic heart failure, pacemaker and aortic stenosis.  Transesophageal echocardiogram in February 2020 demonstrated mild to moderate aortic stenosis.  Previous mitral valve repair noted with moderate mitral stenosis and regurgitation. Moderately reduced ejection fraction 50 to 55%.  Patient denies any recent progressive angina.  Blood pressure within normal limits today.  Patient is alternating Lasix 40 mg q. OD with 60 mg q. OD.  Recent creatinine 1.36, GFR 46  He does report mild to moderate dyspnea on mild to moderate activity.  He has moderate aortic  stenosis and previous mitral valve replacement.  States he has not been very active lately due to the Covid virus attempting to socially isolate and distance from potential contacts.  States he mostly stays at home.  The patient does not have symptoms concerning for COVID-19 infection (fever, chills, cough, or new shortness of breath).    Past Medical History:  Diagnosis Date  . 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 Aortic stenosis 10/14/2015  . CAD (coronary artery disease)    status post Taxus stent patency of RCA 2004, Cardiolite negative for ischemia in 2010.  . Diabetes mellitus ([place])   . Dyslipidemia   . History of kidney stones   . Hypertension   . Iron deficiency anemia 02/02/2018  . Mitral regurgitation 10/15/2015   Moderate-severe by intra-operative TEE  . Overweight   . Right bundle branch block (RBBB) with left anterior hemiblock   . 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 Snores   . Typical atrial flutter [facility])    s/p ablation 11-23-2012 by Dr Rayann Heman   Past Surgical History:  Procedure Laterality Date  . ABLATION  11-23-2012   s/p cavotricuspid isthmus ablation by Dr Rayann Heman  . ANKLE SURGERY Left    total ankle replacement  . ATRIAL FLUTTER ABLATION N/A 11/23/2012   Procedure:  ATRIAL FLUTTER ABLATION;  Surgeon: Thompson Grayer, MD;  Location: [facility] CATH LAB;  Service: Cardiovascular;  Laterality: N/A;  . CARDIAC CATHETERIZATION N/A 10/13/2015   Procedure: Left Heart Cath and Coronary Angiography;  Surgeon: Peter M Martinique, MD;  Location: [place] CV LAB;  Service: Cardiovascular;  Laterality: N/A;  . CATARACT EXTRACTION Right   . CATARACT  EXTRACTION W/PHACO Left 09/13/2013   Procedure: CATARACT EXTRACTION PHACO AND INTRAOCULAR LENS PLACEMENT (IOC);  Surgeon: Tonny Branch, MD;  [facility]: [facility] ORS;  Service: Ophthalmology;  Laterality: Left;  CDE 9.38  . COLONOSCOPY N/A 10/10/2014   Procedure: COLONOSCOPY;  Surgeon: Rogene Houston, MD;  Location: [facility] ENDO SUITE;  Service: Endoscopy;  Laterality: N/A;  830  . CORONARY ANGIOPLASTY     3 stents  . CORONARY ARTERY BYPASS GRAFT N/A 10/15/2015   Procedure: CORONARY ARTERY BYPASS GRAFTING (CABG) X4 LIMA-LAD; SEQ SVG-PD-PL; SVG-OM1 ENDOSCOPIC GREATER SAPHENOUS VEIN HARVEST(EVH) BILAT LOWER EXTREM;  Surgeon: Rexene Alberts, MD;  Location: [place];  Service: Open Heart Surgery;  Laterality: N/A;  . EP IMPLANTABLE DEVICE N/A 10/20/2015   Procedure: Pacemaker Implant;  Surgeon: Evans Lance, MD;  Location: [place] CV LAB;  Service: Cardiovascular;  Laterality: N/A;  . MITRAL VALVE REPAIR N/A 10/15/2015   Procedure: MITRAL VALVE REPAIR (MVR), # 28 MEMO 3-D RING ANNULOPLASTY AND COMPLEX VALVE REPAIR;  Surgeon: Rexene Alberts, MD;  Location: [place];  Service: Open Heart Surgery;  Laterality: N/A;  . TEE WITHOUT CARDIOVERSION N/A 10/15/2015   Procedure: TRANSESOPHAGEAL ECHOCARDIOGRAM (TEE);  Surgeon: Rexene Alberts, MD;  Location: [place];  Service: Open Heart Surgery;  Laterality: N/A;  . TEE WITHOUT CARDIOVERSION N/A 09/16/2017   Procedure: TRANSESOPHAGEAL ECHOCARDIOGRAM (TEE);  Surgeon: Dorothy Spark, MD;  Location: [facility] ENDOSCOPY;  Service: Cardiovascular;  Laterality: N/A;  . TEE WITHOUT CARDIOVERSION N/A 06/26/2018   Procedure: TRANSESOPHAGEAL ECHOCARDIOGRAM (TEE);  Surgeon: [name], MD;  Location: [place];  Service: Cardiology;  Laterality: N/A;     Current Meds  Medication Sig  . aspirin EC 81 MG tablet Take 1 tablet (81 mg total) by mouth daily. (Patient taking differently: Take 81 mg by mouth every evening. )  . ASTAXANTHIN PO Take 10 mg by mouth daily.   Marland Kitchen atorvastatin  (LIPITOR) 40 MG tablet Take 40 mg by mouth daily.   . carvedilol (COREG) 12.5 MG tablet Take 1.5 tablets (18.75 mg total) by mouth 2 (two) times daily.  . Coenzyme Q10 200 MG capsule Take 200 mg by mouth daily.   . furosemide (LASIX) 40 MG tablet Take 40 mg by mouth as directed. 40 mg one day, 60 mg the next  . lisinopril (ZESTRIL) 5 MG tablet Take 1 tablet (5 mg total) by mouth daily.  . metFORMIN (GLUCOPHAGE) 500 MG tablet Take 1,000 mg by mouth 2 (two) times daily with a meal.   . potassium chloride (KLOR-CON 10) 10 MEQ tablet Take 1 tablet (10 mEq total) by mouth 2 (two) times daily.  Marland Kitchen RELION PRIME TEST test strip USE 1 STRIP TO CHECK GLUCOSE ONCE DAILY  . SF 5000 PLUS 1.1 % CREA dental cream Apply 1 application topically See admin instructions. Use 1 application 2 times a week at bedtime.  Marland Kitchen warfarin (COUMADIN) 5 MG tablet Take 1 tablet daily except 1 1/2 tablets on Tuesdays, Thursdays and Saturdays  . [DISCONTINUED] furosemide (LASIX) 40 MG tablet Take 1 tablet (40 mg total) by mouth 2 (two) times daily.  Allergies:   Patient has no known allergies.   Social History   Tobacco Use  . Smoking status: Former Smoker    Packs/day: 1.00    Years: 28.00    Pack years: 28.00    Types: Cigarettes    Start date: 10/21/1956    Quit date: 05/10/1985    Years since quitting: 34.0  . Smokeless tobacco: Never Used  Substance Use Topics  . Alcohol use: Not Currently    Alcohol/week: 0.0 standard drinks    Comment: occasional wine  . Drug use: No     Family Hx: The patient's family history includes Leukemia (age of onset: 43) in his brother; Other in his mother; Other (age of onset: 61) in his father.  ROS:   Please see the history of present illness.    All other systems reviewed and are negative.   Prior CV studies:   The following studies were reviewed today:  Remote pacemaker check 04/23/2019 Scheduled remote reviewed. Normal device function. 2 ATR episodes, both < 1  minute 1 VHR episode   Transesophageal echocardiogram June 26, 2018 IMPRESSIONS   1. The left ventricle has low normal systolic function, with an ejection fraction of 50-55%.  2. The right ventricle has moderately reduced systolic function. The cavity was mildly enlarged.  3. A #28 Memo 3D ring valve is present in the mitral position. Procedure Date: 10/15/2015 There is no Echo evidence of rocking, perivalvular leak, thrombus, pannus or dehiscence of the mitral prosthesis.  4. MV Mean grad:9.0 mmHg HR 79 bpm.  5. AV Mean Grad:17.0 mmHg. Mild-moderate aortic valve stenosis.  6. Left atrial size was moderately dilated.  7. Right atrial size was moderately dilated.  8. There is evidence of plaque in the descending aorta and aortic arch.  9. The pulmonic valve was normal in structure. 10. The tricuspid valve was normal in structure. Tricuspid valve regurgitation is mild-moderate.    CT of chest with contrast on Sep 21, 2018 IMPRESSION: 1. Right middle lobe pulmonary nodules, including the 4 mm nodule described on the prior exam are unchanged. Given stability back to 08/18/2017, these can be presumed benign. 2. A posterolateral left upper lobe pleural-based soft tissue density on the order of 2.0 cm is favored to represent an area of (rounded) atelectasis or scarring. This is also unchanged back to 08/18/2017. Consider CT follow-up at 6 months to confirm stability. 3. Decrease in left larger than right pleural effusions. 4. 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 5. Borderline thoracic adenopathy, similar and likely reactive.   Cardiac catheterization 2017  Mid RCA to Dist RCA lesion, 100% stenosed. The lesion was previously treated with a drug-eluting stent greater than two years ago.  Mid RCA lesion, 100% stenosed. The lesion was previously treated with a drug-eluting stent greater than two years ago.  Ost RCA to Prox RCA lesion, 100% stenosed. The  lesion was previously treated with a drug-eluting stent greater than two years ago.  Ost LM lesion, 75% stenosed.  Ost LM to LM lesion, 50% stenosed.  Prox Cx to Mid Cx lesion, 50% stenosed.  Prox LAD lesion, 90% stenosed. The left ventricular systolic function is normal.  1. 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 Good LV function   Labs/Other Tests and Data Reviewed:    EKG:  An ECG dated March 02, 2019 was personally reviewed today and demonstrated:  Electronic ventricular pacemaker rate of 81  Recent Labs: 04/23/2019: ALT 16; BUN 39; Creatinine, Ser 1.38;  Hemoglobin 12.8; Platelets 243; Potassium 4.4; Sodium 143   Recent Lipid Panel Lab Results  Component Value Date/Time   CHOL 108 10/14/2015 04:31 AM   TRIG 157 (H) 10/14/2015 04:31 AM   HDL 34 (L) 10/14/2015 04:31 AM   CHOLHDL 3.2 10/14/2015 04:31 AM   LDLCALC 43 10/14/2015 04:31 AM    Wt Readings from Last 3 Encounters:  05/02/19 207 lb (93.9 kg)  03/02/19 208 lb (94.3 kg)  01/16/19 216 lb (98 kg)     Objective:    Vital Signs:  BP 117/61   Pulse 62   Ht 5\' 11"  (1.803 m)   Wt 207 lb (93.9 kg)   BMI 28.87 kg/m    VITAL SIGNS:  reviewed   Speaking in complete sentences with normal speech pattern.  No evidence of cough, wheezing, or dyspnea noted.  ASSESSMENT & PLAN:    1. Cardiac pacemaker in situ Pacemaker in place.  Recent remote check showed a normal functioning device.  2. Essential hypertension Blood pressure within normal limits today.  117/61, pulse 62.  Continue lisinopril 5 mg, 10 you Lasix 40 mg every other day and 60 mg every other day.  3. Hyperlipidemia LDL goal <70 Continues on atorvastatin 40 mg/day.  Please obtain recent lipid profile from Dr. Manuella Ghazi  4. Nonrheumatic aortic valve stenosis Mild to moderate aortic valve stenosis on recent echocardiogram.  Patient states he does have some activity intolerance and mild dyspnea on mild to moderate activity.  5.mitral valve  stenosis with MV replacement  S/p mitral valve repair with moderate mitral stenosis at rest. Mild MR No PFO.  6.CAD  History of four-vessel bypass.  June 2017; LIMA to LAD, SVG to OM, sequential SVG to PDA-RPL, EVH via bilateral thighs.  No progressive anginal symptoms.  Continue aspirin 81 mg, carvedilol 18.75 mg by mouth twice daily.   COVID-19 Education: The signs and symptoms of COVID-19 were discussed with the patient and how to seek care for testing (follow up with PCP or arrange E-visit).  The importance of social distancing was discussed today.  Patient would like all recent lab work from clinic faxed to Dr. Manuella Ghazi  Time:   Today, I have spent 15 minutes with the patient with telehealth technology discussing the above problems.     Medication Adjustments/Labs and Tests Ordered: Current medicines are reviewed at length with the patient today.  Concerns regarding medicines are outlined above.   Tests Ordered: No orders of the defined types were placed in this encounter.   Medication Changes: No orders of the defined types were placed in this encounter.   Follow Up:  Either In Person or Virtual in 6 month(s)  Signed, Verta Ellen, NP  05/02/2019 8:50 AM    [place]

## 2019-05-16 ENCOUNTER — Other Ambulatory Visit: Payer: Self-pay

## 2019-05-16 ENCOUNTER — Ambulatory Visit (INDEPENDENT_AMBULATORY_CARE_PROVIDER_SITE_OTHER): Payer: Medicare Other | Admitting: *Deleted

## 2019-05-16 DIAGNOSIS — Z5181 Encounter for therapeutic drug level monitoring: Secondary | ICD-10-CM

## 2019-05-16 DIAGNOSIS — Z01812 Encounter for preprocedural laboratory examination: Secondary | ICD-10-CM

## 2019-05-16 DIAGNOSIS — I4892 Unspecified atrial flutter: Secondary | ICD-10-CM | POA: Diagnosis not present

## 2019-05-16 DIAGNOSIS — I059 Rheumatic mitral valve disease, unspecified: Secondary | ICD-10-CM | POA: Diagnosis not present

## 2019-05-16 DIAGNOSIS — I350 Nonrheumatic aortic (valve) stenosis: Secondary | ICD-10-CM | POA: Diagnosis not present

## 2019-05-16 LAB — POCT INR: INR: 1.7 — AB (ref 2.0–3.0)

## 2019-05-16 NOTE — Patient Instructions (Signed)
Take warfarin 2 tablets tonight then resume 1 tablet daily except 1 1/2 tablets on Tuesdays, Thursdays and Saturdays Recheck in 3 weeks

## 2019-05-22 DIAGNOSIS — E1140 Type 2 diabetes mellitus with diabetic neuropathy, unspecified: Secondary | ICD-10-CM | POA: Diagnosis not present

## 2019-05-22 DIAGNOSIS — E1159 Type 2 diabetes mellitus with other circulatory complications: Secondary | ICD-10-CM | POA: Diagnosis not present

## 2019-05-26 NOTE — Progress Notes (Signed)
PPM remote 

## 2019-06-06 ENCOUNTER — Other Ambulatory Visit: Payer: Self-pay

## 2019-06-06 ENCOUNTER — Ambulatory Visit (INDEPENDENT_AMBULATORY_CARE_PROVIDER_SITE_OTHER): Payer: Medicare Other | Admitting: *Deleted

## 2019-06-06 DIAGNOSIS — Z9889 Other specified postprocedural states: Secondary | ICD-10-CM | POA: Diagnosis not present

## 2019-06-06 DIAGNOSIS — Z01812 Encounter for preprocedural laboratory examination: Secondary | ICD-10-CM | POA: Diagnosis not present

## 2019-06-06 DIAGNOSIS — I4892 Unspecified atrial flutter: Secondary | ICD-10-CM

## 2019-06-06 DIAGNOSIS — I350 Nonrheumatic aortic (valve) stenosis: Secondary | ICD-10-CM | POA: Diagnosis not present

## 2019-06-06 DIAGNOSIS — I059 Rheumatic mitral valve disease, unspecified: Secondary | ICD-10-CM | POA: Diagnosis not present

## 2019-06-06 DIAGNOSIS — Z5181 Encounter for therapeutic drug level monitoring: Secondary | ICD-10-CM

## 2019-06-06 LAB — POCT INR: INR: 1.6 — AB (ref 2.0–3.0)

## 2019-06-06 NOTE — Patient Instructions (Signed)
Increase warfarin to 1 1/2 tablets daily except 1 tablet on Tuesdays and Fridays  Recheck in 2 weeks

## 2019-06-18 DIAGNOSIS — I739 Peripheral vascular disease, unspecified: Secondary | ICD-10-CM | POA: Diagnosis not present

## 2019-06-18 DIAGNOSIS — L110 Acquired keratosis follicularis: Secondary | ICD-10-CM | POA: Diagnosis not present

## 2019-06-18 DIAGNOSIS — M79671 Pain in right foot: Secondary | ICD-10-CM | POA: Diagnosis not present

## 2019-06-18 DIAGNOSIS — E1140 Type 2 diabetes mellitus with diabetic neuropathy, unspecified: Secondary | ICD-10-CM | POA: Diagnosis not present

## 2019-06-18 DIAGNOSIS — M79672 Pain in left foot: Secondary | ICD-10-CM | POA: Diagnosis not present

## 2019-06-27 ENCOUNTER — Other Ambulatory Visit: Payer: Self-pay

## 2019-06-27 ENCOUNTER — Ambulatory Visit (INDEPENDENT_AMBULATORY_CARE_PROVIDER_SITE_OTHER): Payer: Medicare Other | Admitting: *Deleted

## 2019-06-27 DIAGNOSIS — Z9889 Other specified postprocedural states: Secondary | ICD-10-CM

## 2019-06-27 DIAGNOSIS — Z01812 Encounter for preprocedural laboratory examination: Secondary | ICD-10-CM | POA: Diagnosis not present

## 2019-06-27 DIAGNOSIS — I350 Nonrheumatic aortic (valve) stenosis: Secondary | ICD-10-CM | POA: Diagnosis not present

## 2019-06-27 DIAGNOSIS — Z5181 Encounter for therapeutic drug level monitoring: Secondary | ICD-10-CM

## 2019-06-27 DIAGNOSIS — I059 Rheumatic mitral valve disease, unspecified: Secondary | ICD-10-CM | POA: Diagnosis not present

## 2019-06-27 LAB — POCT INR: INR: 3.3 — AB (ref 2.0–3.0)

## 2019-06-27 NOTE — Patient Instructions (Signed)
Hold coumadin today then resume 1 tablet daily except 1 1/2 tablet on Tuesdays, Thursdays and Saturdays  Recheck in 3 weeks

## 2019-07-10 DIAGNOSIS — E1165 Type 2 diabetes mellitus with hyperglycemia: Secondary | ICD-10-CM | POA: Diagnosis not present

## 2019-07-10 DIAGNOSIS — E1122 Type 2 diabetes mellitus with diabetic chronic kidney disease: Secondary | ICD-10-CM | POA: Diagnosis not present

## 2019-07-10 DIAGNOSIS — I10 Essential (primary) hypertension: Secondary | ICD-10-CM | POA: Diagnosis not present

## 2019-07-10 DIAGNOSIS — E1151 Type 2 diabetes mellitus with diabetic peripheral angiopathy without gangrene: Secondary | ICD-10-CM | POA: Diagnosis not present

## 2019-07-10 DIAGNOSIS — Z87891 Personal history of nicotine dependence: Secondary | ICD-10-CM | POA: Diagnosis not present

## 2019-07-10 DIAGNOSIS — Z299 Encounter for prophylactic measures, unspecified: Secondary | ICD-10-CM | POA: Diagnosis not present

## 2019-07-10 DIAGNOSIS — Z6829 Body mass index (BMI) 29.0-29.9, adult: Secondary | ICD-10-CM | POA: Diagnosis not present

## 2019-07-16 DIAGNOSIS — R0781 Pleurodynia: Secondary | ICD-10-CM | POA: Diagnosis not present

## 2019-07-16 DIAGNOSIS — I4892 Unspecified atrial flutter: Secondary | ICD-10-CM | POA: Diagnosis not present

## 2019-07-16 DIAGNOSIS — Z87891 Personal history of nicotine dependence: Secondary | ICD-10-CM | POA: Diagnosis not present

## 2019-07-16 DIAGNOSIS — Z299 Encounter for prophylactic measures, unspecified: Secondary | ICD-10-CM | POA: Diagnosis not present

## 2019-07-16 DIAGNOSIS — S299XXA Unspecified injury of thorax, initial encounter: Secondary | ICD-10-CM | POA: Diagnosis not present

## 2019-07-16 DIAGNOSIS — I10 Essential (primary) hypertension: Secondary | ICD-10-CM | POA: Diagnosis not present

## 2019-07-16 DIAGNOSIS — J90 Pleural effusion, not elsewhere classified: Secondary | ICD-10-CM | POA: Diagnosis not present

## 2019-07-16 DIAGNOSIS — J439 Emphysema, unspecified: Secondary | ICD-10-CM | POA: Diagnosis not present

## 2019-07-19 ENCOUNTER — Other Ambulatory Visit: Payer: Self-pay

## 2019-07-19 ENCOUNTER — Ambulatory Visit (INDEPENDENT_AMBULATORY_CARE_PROVIDER_SITE_OTHER): Payer: Medicare Other | Admitting: *Deleted

## 2019-07-19 DIAGNOSIS — I350 Nonrheumatic aortic (valve) stenosis: Secondary | ICD-10-CM

## 2019-07-19 DIAGNOSIS — Z5181 Encounter for therapeutic drug level monitoring: Secondary | ICD-10-CM

## 2019-07-19 DIAGNOSIS — Z01812 Encounter for preprocedural laboratory examination: Secondary | ICD-10-CM | POA: Diagnosis not present

## 2019-07-19 DIAGNOSIS — Z9889 Other specified postprocedural states: Secondary | ICD-10-CM

## 2019-07-19 DIAGNOSIS — I059 Rheumatic mitral valve disease, unspecified: Secondary | ICD-10-CM | POA: Diagnosis not present

## 2019-07-19 LAB — POCT INR: INR: 2.2 (ref 2.0–3.0)

## 2019-07-19 NOTE — Patient Instructions (Signed)
Continue warfarin 1 tablet daily except 1 1/2 tablet on Tuesdays, Thursdays and Saturdays  Recheck in 4 weeks

## 2019-07-23 ENCOUNTER — Ambulatory Visit (INDEPENDENT_AMBULATORY_CARE_PROVIDER_SITE_OTHER): Payer: Medicare Other | Admitting: *Deleted

## 2019-07-23 DIAGNOSIS — Z950 Presence of cardiac pacemaker: Secondary | ICD-10-CM | POA: Diagnosis not present

## 2019-07-23 LAB — CUP PACEART REMOTE DEVICE CHECK
Battery Remaining Longevity: 114 mo
Battery Remaining Percentage: 100 %
Brady Statistic RA Percent Paced: 5 %
Brady Statistic RV Percent Paced: 77 %
Date Time Interrogation Session: 20210315061800
Implantable Lead Implant Date: 20170612
Implantable Lead Implant Date: 20170612
Implantable Lead Location: 753859
Implantable Lead Location: 753860
Implantable Lead Model: 7741
Implantable Lead Model: 7742
Implantable Lead Serial Number: 733074
Implantable Lead Serial Number: 760165
Implantable Pulse Generator Implant Date: 20170612
Lead Channel Impedance Value: 644 Ohm
Lead Channel Impedance Value: 653 Ohm
Lead Channel Pacing Threshold Amplitude: 0.6 V
Lead Channel Pacing Threshold Pulse Width: 0.4 ms
Lead Channel Setting Pacing Amplitude: 2 V
Lead Channel Setting Pacing Amplitude: 2.5 V
Lead Channel Setting Pacing Pulse Width: 0.4 ms
Lead Channel Setting Sensing Sensitivity: 2.5 mV
Pulse Gen Serial Number: 724121

## 2019-07-23 NOTE — Progress Notes (Signed)
PPM Remote  

## 2019-07-25 ENCOUNTER — Other Ambulatory Visit: Payer: Self-pay

## 2019-07-25 ENCOUNTER — Telehealth (INDEPENDENT_AMBULATORY_CARE_PROVIDER_SITE_OTHER): Payer: Medicare Other | Admitting: Internal Medicine

## 2019-07-25 DIAGNOSIS — I5032 Chronic diastolic (congestive) heart failure: Secondary | ICD-10-CM

## 2019-07-25 DIAGNOSIS — I480 Paroxysmal atrial fibrillation: Secondary | ICD-10-CM | POA: Diagnosis not present

## 2019-07-25 DIAGNOSIS — Z950 Presence of cardiac pacemaker: Secondary | ICD-10-CM | POA: Diagnosis not present

## 2019-07-25 DIAGNOSIS — I059 Rheumatic mitral valve disease, unspecified: Secondary | ICD-10-CM

## 2019-07-25 NOTE — Progress Notes (Signed)
Electrophysiology TeleHealth Note   Due to national recommendations of social distancing due to COVID 19, an audio/video telehealth visit is felt to be most appropriate for this patient at this time.  See MyChart message from today for the patient's consent to telehealth for [facility].   Date:  07/25/2019   ID:  Christian Rasmussen, DOB Mar 28, 1941, MRN [id number]  Location: patient's home  Provider location: [address], [place]  Evaluation Performed: Follow-up visit  PCP:  Monico Blitz, MD  Cardiologist:  Kate Sable, MD  Electrophysiologist:  Dr Lovena Le  Chief Complaint:  "I'm doing ok."  History of Present Illness:    Christian Rasmussen is a 79 y.o. male who presents via audio/video conferencing for a telehealth visit today.  He has a h/o diastolic CHF, s/p mitral valve replacement who has been bothered by worsening fatigue and dyspnea with exertion. He denies chest pain. He gets winded easily when he walks. He last had a 2D echo 4 years ago. The patient is otherwise without complaint today.  The patient denies symptoms of fevers, chills, cough, or new SOB worrisome for COVID 19.  Past Medical History:  Diagnosis Date  . 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 Aortic stenosis 10/14/2015  . CAD (coronary artery disease)    status post Taxus stent patency of RCA 2004, Cardiolite negative for ischemia in 2010.  . Diabetes mellitus ([place])   . Dyslipidemia   . History of kidney stones   . Hypertension   . Iron deficiency anemia 02/02/2018  . Mitral regurgitation 10/15/2015   Moderate-severe by intra-operative TEE  . Overweight   . Right bundle branch block (RBBB) with left anterior hemiblock   . 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 Snores   . Typical atrial flutter [facility])    s/p ablation 11-23-2012 by Dr Rayann Heman    Past Surgical History:  Procedure Laterality Date  . ABLATION  11-23-2012   s/p cavotricuspid isthmus ablation by Dr Rayann Heman  . ANKLE SURGERY Left    total ankle replacement  . ATRIAL FLUTTER ABLATION N/A 11/23/2012   Procedure: ATRIAL FLUTTER ABLATION;  Surgeon: Thompson Grayer, MD;  Location: [facility] CATH LAB;  Service: Cardiovascular;  Laterality: N/A;  . CARDIAC CATHETERIZATION N/A 10/13/2015   Procedure: Left Heart Cath and Coronary Angiography;  Surgeon: Peter M Martinique, MD;  Location: [place] CV LAB;  Service: Cardiovascular;  Laterality: N/A;  . CATARACT EXTRACTION Right   . CATARACT EXTRACTION W/PHACO Left 09/13/2013   Procedure: CATARACT EXTRACTION PHACO AND INTRAOCULAR LENS PLACEMENT (IOC);  Surgeon: Tonny Branch, MD;  Location: [facility] ORS;  Service: Ophthalmology;  Laterality: Left;  CDE 9.38  . COLONOSCOPY N/A 10/10/2014   Procedure: COLONOSCOPY;  Surgeon: Rogene Houston, MD;  [facility] ENDO [facility];  Service: Endoscopy;  Laterality: N/A;  830  . CORONARY ANGIOPLASTY     3 stents  . CORONARY ARTERY BYPASS GRAFT N/A 10/15/2015   Procedure: CORONARY ARTERY BYPASS GRAFTING (CABG) X4 LIMA-LAD; SEQ SVG-PD-PL; SVG-OM1 ENDOSCOPIC GREATER SAPHENOUS VEIN HARVEST(EVH) BILAT LOWER EXTREM;  Surgeon: Rexene Alberts, MD;  Location: [place];  Service: Open Heart Surgery;  Laterality: N/A;  . EP IMPLANTABLE DEVICE N/A 10/20/2015   Procedure: Pacemaker Implant;  Surgeon: Evans Lance, MD;  Location: [place] CV LAB;  Service: Cardiovascular;  Laterality: N/A;  . MITRAL VALVE REPAIR N/A 10/15/2015   Procedure: MITRAL VALVE REPAIR (MVR), # 28 MEMO 3-D RING ANNULOPLASTY AND COMPLEX VALVE REPAIR;  Surgeon: Rexene Alberts, MD;  Location: [place];  Service: Open Heart Surgery;  Laterality: N/A;  . TEE WITHOUT CARDIOVERSION N/A 10/15/2015    Procedure: TRANSESOPHAGEAL ECHOCARDIOGRAM (TEE);  Surgeon: Rexene Alberts, MD;  Location: [place];  Service: Open Heart Surgery;  Laterality: N/A;  . TEE WITHOUT CARDIOVERSION N/A 09/16/2017   Procedure: TRANSESOPHAGEAL ECHOCARDIOGRAM (TEE);  Surgeon: Dorothy Spark, MD;  Location: [facility] ENDOSCOPY;  Service: Cardiovascular;  Laterality: N/A;  . TEE WITHOUT CARDIOVERSION N/A 06/26/2018   Procedure: TRANSESOPHAGEAL ECHOCARDIOGRAM (TEE);  Surgeon: [name], MD;  Location: [place];  Service: Cardiology;  Laterality: N/A;    Current Outpatient Medications  Medication Sig Dispense Refill  . aspirin EC 81 MG tablet Take 1 tablet (81 mg total) by mouth daily. (Patient taking differently: Take 81 mg by mouth every evening. ) 90 tablet 3  . ASTAXANTHIN PO Take 10 mg by mouth daily.     Marland Kitchen atorvastatin (LIPITOR) 40 MG tablet Take 40 mg by mouth daily.     . carvedilol (COREG) 12.5 MG tablet Take 1.5 tablets (18.75 mg total) by mouth 2 (two) times daily. 270 tablet 3  . Coenzyme Q10 200 MG capsule Take 200 mg by mouth daily.     . furosemide (LASIX) 40 MG tablet Take 40 mg by mouth as directed. 40 mg one day, 60 mg the next    . lisinopril (ZESTRIL) 5 MG tablet Take 1 tablet (5 mg total) by mouth daily. 90 tablet 3  . metFORMIN (GLUCOPHAGE) 500 MG tablet Take 1,000 mg by mouth 2 (two) times daily with a meal.   2  . potassium chloride (KLOR-CON 10) 10 MEQ tablet Take 1 tablet (10 mEq total) by mouth 2 (two) times daily. 180 tablet 3  . RELION PRIME TEST test strip USE 1 STRIP TO CHECK GLUCOSE ONCE DAILY    . SF 5000 PLUS 1.1 % CREA dental cream Apply 1 application topically See admin instructions. Use 1 application 2 times a week at bedtime.    Marland Kitchen warfarin (COUMADIN) 5 MG tablet Take 1 tablet daily except 1 1/2 tablets on Tuesdays, Thursdays and Saturdays 45 tablet 6   No current facility-administered medications for this visit.    Allergies:   Patient has no known allergies.   Social  History:  The patient  reports that he quit smoking about 34 years ago. His smoking use included cigarettes. He started smoking about 62 years ago. He has a 28.00 pack-year smoking history. He has never used smokeless tobacco. He reports previous alcohol use. He reports that he does not use drugs.   Family History:  The patient's  family history includes Leukemia (age of onset: 90) in his brother; Other in his mother; Other (age of onset: 49) in his father.   ROS:  Please see the history of present illness.   All other systems are personally reviewed and negative.    Exam:    Vital Signs:   Labs/Other Tests and Data Reviewed:    Recent Labs: 04/23/2019: ALT 16; BUN 39; Creatinine, Ser 1.38; Hemoglobin 12.8; Platelets 243; Potassium 4.4; Sodium 143   Wt Readings from Last  3 Encounters:  05/02/19 207 lb (93.9 kg)  03/02/19 208 lb (94.3 kg)  01/16/19 216 lb (98 kg)     Other studies personally reviewed:  Last device remote is reviewed from [facility] PDF dated 3/21 which reveals normal device function, no arrhythmias    ASSESSMENT & PLAN:    1.  Chronic diastolic heart failure - his symptoms are class 3. He will undergo a repeat 2D echo. If his EF is down, consider adding Entresto. 2. PAF - he is mostly maintaining NSR. No change in him meds. 3. PPM - his b[employer] Sci DDD PM implanted for CHB is working normally. His conduction remains intact.  4. COVID 19 screen The patient denies symptoms of COVID 19 at this time.  The importance of social distancing was discussed today.  Follow-up:  6 months Next remote: 6/21  Current medicines are reviewed at length with the patient today.   The patient does not have concerns regarding his medicines.  The following changes were made today:  none  Labs/ tests ordered today include: none No orders of the defined types were placed in this encounter.    Patient Risk:  after full review of this patients clinical status, I feel that they are at  moderate risk at this time.  Today, I have spent 15 minutes with the patient with telehealth technology discussing his symptoms.  Loma Messing, MD  07/25/2019 3:54 PM     [facility] [address] [place] [address] [phone] (office) [phone] (fax)

## 2019-07-25 NOTE — Patient Instructions (Signed)
Medication Instructions:  Your physician recommends that you continue on your current medications as directed. Please refer to the Current Medication list given to you today.  Labwork: None ordered.  Testing/Procedures: Your physician has requested that you have an echocardiogram. Echocardiography is a painless test that uses sound waves to create images of your heart. It provides your doctor with information about the size and shape of your heart and how well your heart's chambers and valves are working. This procedure takes approximately one hour. There are no restrictions for this procedure.  Please schedule for an ECHO  Follow-Up: Your physician wants you to follow-up in: 6 months with Dr. Lovena Le in [place].   You will receive a reminder letter in the mail two months in advance. If you don't receive a letter, please call our office to schedule the follow-up appointment.  Remote monitoring is used to monitor your Pacemaker from home. This monitoring reduces the number of office visits required to check your device to one time per year. It allows [place] to keep an eye on the functioning of your device to ensure it is working properly. You are scheduled for a device check from home on 10/22/2019. You may send your transmission at any time that day. If you have a wireless device, the transmission will be sent automatically. After your physician reviews your transmission, you will receive a postcard with your next transmission date.  Any Other Special Instructions Will Be Listed Below (If Applicable).  If you need a refill on your cardiac medications before your next appointment, please call your pharmacy.

## 2019-08-01 ENCOUNTER — Ambulatory Visit (HOSPITAL_COMMUNITY)
Admission: RE | Admit: 2019-08-01 | Discharge: 2019-08-01 | Disposition: A | Discharge status: Home / Self Care | Payer: Medicare Other | Source: Ambulatory Visit | Attending: Internal Medicine | Admitting: Internal Medicine

## 2019-08-01 ENCOUNTER — Other Ambulatory Visit: Payer: Self-pay

## 2019-08-01 DIAGNOSIS — I059 Rheumatic mitral valve disease, unspecified: Secondary | ICD-10-CM | POA: Diagnosis not present

## 2019-08-01 DIAGNOSIS — I5032 Chronic diastolic (congestive) heart failure: Secondary | ICD-10-CM | POA: Insufficient documentation

## 2019-08-01 NOTE — Progress Notes (Signed)
*  PRELIMINARY RESULTS* Echocardiogram 2D Echocardiogram has been performed.  Leavy Cella 08/01/2019, 4:36 PM

## 2019-08-02 ENCOUNTER — Telehealth: Payer: Self-pay | Admitting: Cardiovascular Disease

## 2019-08-02 NOTE — Telephone Encounter (Signed)
Would like to know results from Echo.

## 2019-08-02 NOTE — Telephone Encounter (Signed)
Advised patient that echo result was sent to Dr. Lovena Le who has not reviewed it yet. Advised that once echo is resulted on, he will be contacted with result. Verbalized understanding.

## 2019-08-03 ENCOUNTER — Telehealth: Payer: Self-pay

## 2019-08-03 NOTE — Telephone Encounter (Signed)
Pt would like to know ECHO results ordered by Dr.Taylor.  Please call [phone]  Thanks renee

## 2019-08-03 NOTE — Telephone Encounter (Signed)
Will forward to Dr. Lovena Le to result.

## 2019-08-05 ENCOUNTER — Other Ambulatory Visit: Payer: Self-pay | Admitting: Cardiology

## 2019-08-06 ENCOUNTER — Telehealth: Payer: Self-pay | Admitting: Internal Medicine

## 2019-08-06 NOTE — Telephone Encounter (Signed)
Will forward to Dr. Taylor to advise.  

## 2019-08-06 NOTE — Telephone Encounter (Signed)
Patient called requesting to see if Dr. Lovena Le has read his recent echo.

## 2019-08-07 ENCOUNTER — Telehealth: Payer: Self-pay

## 2019-08-07 DIAGNOSIS — Z79899 Other long term (current) drug therapy: Secondary | ICD-10-CM

## 2019-08-07 MED ORDER — FUROSEMIDE 40 MG PO TABS: 40.0000 mg | ORAL_TABLET | Freq: Two times a day (BID) | ORAL | 6 refills | Status: DC

## 2019-08-07 NOTE — Telephone Encounter (Signed)
Pt notified of echo results by J.Smith RN

## 2019-08-07 NOTE — Telephone Encounter (Signed)
Patient's wife Lovey Newcomer) called stating that patient still doesn't understand the results of recent echo. She is very concerned and would like to hear  The results herself.  Please call [phone]

## 2019-08-07 NOTE — Telephone Encounter (Signed)
Christian Rasmussen agrees to take lasix 40 mg BID and I will mail him lab slip for bmet and copy of echo report.

## 2019-08-07 NOTE — Telephone Encounter (Signed)
Pt would like results from ECHO.  Please call [phone]   Thanks renee

## 2019-08-07 NOTE — Telephone Encounter (Signed)
The pressure inside his heart is increased. His heart pumping function is mildly reduced. This would explain his shortness of breath. If that does not explain enough then he will need to make an appointment to discuss. GT

## 2019-08-07 NOTE — Telephone Encounter (Signed)
-----   Message from Herminio Commons, MD sent at 08/07/2019  2:30 PM EDT ----- If he is still feeling short of breath, I would have him take Lasix 40 mg twice daily and have him weigh daily.  If he agrees to increasing his diuretic dose to help symptoms improve, I would obtain a follow-up BMET in 1 week.

## 2019-08-08 NOTE — Telephone Encounter (Signed)
Wife Lovey Newcomer notified and stated that she would like an appt with Dr. Lovena Le.

## 2019-08-14 ENCOUNTER — Other Ambulatory Visit: Payer: Self-pay

## 2019-08-14 ENCOUNTER — Ambulatory Visit (INDEPENDENT_AMBULATORY_CARE_PROVIDER_SITE_OTHER): Payer: Medicare Other | Admitting: Internal Medicine

## 2019-08-14 ENCOUNTER — Encounter: Payer: Self-pay | Admitting: Internal Medicine

## 2019-08-14 VITALS — BP 122/78 | HR 52 | Temp 96.9°F | Ht 71.0 in | Wt 214.0 lb

## 2019-08-14 DIAGNOSIS — Z950 Presence of cardiac pacemaker: Secondary | ICD-10-CM

## 2019-08-14 DIAGNOSIS — I272 Pulmonary hypertension, unspecified: Secondary | ICD-10-CM

## 2019-08-14 LAB — CUP PACEART INCLINIC DEVICE CHECK
Brady Statistic AP VS Percent: 5 %
Brady Statistic AS VP Percent: 76 %
Date Time Interrogation Session: 20210406164115
Implantable Lead Implant Date: 20170612
Implantable Lead Implant Date: 20170612
Implantable Lead Location: 753859
Implantable Lead Location: 753860
Implantable Lead Model: 7741
Implantable Lead Model: 7742
Implantable Lead Serial Number: 733074
Implantable Lead Serial Number: 760165
Implantable Pulse Generator Implant Date: 20170612
Lead Channel Pacing Threshold Amplitude: 0.7 V
Lead Channel Pacing Threshold Amplitude: 1.1 V
Lead Channel Pacing Threshold Pulse Width: 0.4 ms
Lead Channel Pacing Threshold Pulse Width: 0.4 ms
Lead Channel Sensing Intrinsic Amplitude: 3.2 mV
Lead Channel Sensing Intrinsic Amplitude: 6.9 mV
Lead Channel Setting Pacing Amplitude: 2 V
Lead Channel Setting Pacing Amplitude: 2.5 V
Lead Channel Setting Pacing Pulse Width: 0.4 ms
Lead Channel Setting Sensing Sensitivity: 2.5 mV
Pulse Gen Serial Number: 724121

## 2019-08-14 NOTE — Patient Instructions (Signed)
Medication Instructions:  Your physician recommends that you continue on your current medications as directed. Please refer to the Current Medication list given to you today.  *If you need a refill on your cardiac medications before your next appointment, please call your pharmacy*   Lab Work: NONE  If you have labs (blood work) drawn today and your tests are completely normal, you will receive your results only by: Marland Kitchen MyChart Message (if you have MyChart) OR . A paper copy in the mail If you have any lab test that is abnormal or we need to change your treatment, we will call you to review the results.   Testing/Procedures: NONE    Follow-Up: At [facility], you and your health needs are our priority.  As part of our continuing mission to provide you with exceptional heart care, we have created designated Provider Care Teams.  These Care Teams include your primary Cardiologist (physician) and Advanced Practice Providers (APPs -  Physician Assistants and Nurse Practitioners) who all work together to provide you with the care you need, when you need it.  We recommend signing up for the patient portal called "MyChart".  Sign up information is provided on this After Visit Summary.  MyChart is used to connect with patients for Virtual Visits (Telemedicine).  Patients are able to view lab/test results, encounter notes, upcoming appointments, etc.  Non-urgent messages can be sent to your provider as well.   To learn more about what you can do with MyChart, go to [email or web address].    Your next appointment:    Heart Failure [facility]   The format for your next appointment:   In Person  Provider:      Other Instructions Thank you for choosing [facility]!

## 2019-08-14 NOTE — Progress Notes (Signed)
HPI [name] returns today for followup. He is a pleasant 79 yo man with valvular heart disease, s/p MV repair, who has had progressive sob. He has mild AS, and moderate MS, and PA pressures in the 60's. He has class 3 dyspnea. He notes that he remains sob despite escalating doses of lasix and is now on 40 bid. He has peripheral edema. His weight is up about 10 lbs. He admits to some sodium indiscretion.  No Known Allergies   Current Outpatient Medications  Medication Sig Dispense Refill  . aspirin EC 81 MG tablet Take 1 tablet (81 mg total) by mouth daily. (Patient taking differently: Take 81 mg by mouth every evening. ) 90 tablet 3  . ASTAXANTHIN PO Take 10 mg by mouth daily.     Marland Kitchen atorvastatin (LIPITOR) 40 MG tablet Take 40 mg by mouth daily.     . carvedilol (COREG) 12.5 MG tablet Take 1.5 tablets (18.75 mg total) by mouth 2 (two) times daily. 270 tablet 3  . Coenzyme Q10 200 MG capsule Take 200 mg by mouth daily.     . furosemide (LASIX) 40 MG tablet Take 1 tablet (40 mg total) by mouth 2 (two) times daily. 60 tablet 6  . lisinopril (ZESTRIL) 5 MG tablet Take 1 tablet (5 mg total) by mouth daily. 90 tablet 3  . metFORMIN (GLUCOPHAGE) 500 MG tablet Take 1,000 mg by mouth 2 (two) times daily with a meal.   2  . potassium chloride (KLOR-CON 10) 10 MEQ tablet Take 1 tablet (10 mEq total) by mouth 2 (two) times daily. 180 tablet 3  . RELION PRIME TEST test strip USE 1 STRIP TO CHECK GLUCOSE ONCE DAILY    . SF 5000 PLUS 1.1 % CREA dental cream Apply 1 application topically See admin instructions. Use 1 application 2 times a week at bedtime.    Marland Kitchen warfarin (COUMADIN) 5 MG tablet TAKE ONE AND ONE-HALF TABLETS BY MOUTH ON TUESDAY, THURSDAY, SATURDAY, AND ONE TABLET ON ALL OTHER DAYS 45 tablet 0   No current facility-administered medications for this visit.     Past Medical History:  Diagnosis Date  . 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 Aortic stenosis 10/14/2015  .  CAD (coronary artery disease)    status post Taxus stent patency of RCA 2004, Cardiolite negative for ischemia in 2010.  . Diabetes mellitus ([place])   . Dyslipidemia   . History of kidney stones   . Hypertension   . Iron deficiency anemia 02/02/2018  . Mitral regurgitation 10/15/2015   Moderate-severe by intra-operative TEE  . Overweight   . Right bundle branch block (RBBB) with left anterior hemiblock   . 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 Snores   . Typical atrial flutter ([place])    s/p ablation 11-23-2012 by Dr Rayann Heman    ROS:   All systems reviewed and negative except as noted in the HPI.   Past Surgical History:  Procedure Laterality Date  . ABLATION  11-23-2012   s/p cavotricuspid isthmus ablation by Dr Rayann Heman  . ANKLE SURGERY Left    total ankle replacement  . ATRIAL FLUTTER ABLATION  N/A 11/23/2012   Procedure: ATRIAL FLUTTER ABLATION;  Surgeon: Thompson Grayer, MD;  Location: [facility] CATH LAB;  Service: Cardiovascular;  Laterality: N/A;  . CARDIAC CATHETERIZATION N/A 10/13/2015   Procedure: Left Heart Cath and Coronary Angiography;  Surgeon: Peter M Martinique, MD;  Location: [place] CV LAB;  Service: Cardiovascular;  Laterality: N/A;  . CATARACT EXTRACTION Right   . CATARACT EXTRACTION W/PHACO Left 09/13/2013   Procedure: CATARACT EXTRACTION PHACO AND INTRAOCULAR LENS PLACEMENT (IOC);  Surgeon: Tonny Branch, MD;  Location: [facility] ORS;  Service: Ophthalmology;  Laterality: Left;  CDE 9.38  . COLONOSCOPY N/A 10/10/2014   Procedure: COLONOSCOPY;  Surgeon: Rogene Houston, MD;  Location: [facility] ENDO SUITE;  Service: Endoscopy;  Laterality: N/A;  830  . CORONARY ANGIOPLASTY     3 stents    . CORONARY ARTERY BYPASS GRAFT N/A 10/15/2015   Procedure: CORONARY ARTERY BYPASS GRAFTING (CABG) X4 LIMA-LAD; SEQ SVG-PD-PL; SVG-OM1 ENDOSCOPIC GREATER SAPHENOUS VEIN HARVEST(EVH) BILAT LOWER EXTREM;  Surgeon: Rexene Alberts, MD;  Location: [place];  Service: Open Heart Surgery;  Laterality: N/A;  . EP IMPLANTABLE DEVICE N/A 10/20/2015   Procedure: Pacemaker Implant;  Surgeon: Evans Lance, MD;  Location: [place] CV LAB;  Service: Cardiovascular;  Laterality: N/A;  . MITRAL VALVE REPAIR N/A 10/15/2015   Procedure: MITRAL VALVE REPAIR (MVR), # 28 MEMO 3-D RING ANNULOPLASTY AND COMPLEX VALVE REPAIR;  Surgeon: Rexene Alberts, MD;  Location: [place];  Service: Open Heart Surgery;  Laterality: N/A;  . TEE WITHOUT CARDIOVERSION N/A 10/15/2015   Procedure: TRANSESOPHAGEAL ECHOCARDIOGRAM (TEE);  Surgeon: Rexene Alberts, MD;  Location: [place];  Service: Open Heart Surgery;  Laterality: N/A;  . TEE WITHOUT CARDIOVERSION N/A 09/16/2017   Procedure: TRANSESOPHAGEAL ECHOCARDIOGRAM (TEE);  Surgeon: Dorothy Spark, MD;  Location: [facility] ENDOSCOPY;  Service: Cardiovascular;  Laterality: N/A;  . TEE WITHOUT CARDIOVERSION N/A 06/26/2018   Procedure: TRANSESOPHAGEAL ECHOCARDIOGRAM (TEE);  Surgeon: [name], MD;  Location: [place];  Service: Cardiology;  Laterality: N/A;     Family History  Problem Relation Age of Onset  . Other Father 45       Died from MI.  . Other Mother        alive & well  . Leukemia Brother 88       died     Social History   Socioeconomic History  . Marital status: Married    Spouse name: Not on file  . Number of children: 1  . Years of education: Not on file  . Highest education level: Not on file  Occupational History  . 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 Smoking status: Former Smoker    Packs/day: 1.00    Years: 28.00    Pack years: 28.00    Types: Cigarettes    Start date: 10/21/1956    Quit date: 05/10/1985    Years since  quitting: 34.2  . Smokeless tobacco: Never Used  Substance and Sexual Activity  . Alcohol use: Not Currently    Alcohol/week: 0.0 standard drinks    Comment: occasional wine  . Drug use: No  . Sexual activity: Yes    Birth control/protection: None  Other Topics Concern  . Not on file  Social History Narrative   Lives in [place] with spouse.   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 Difficulty of Paying Living Expenses:   Food Insecurity:   .  Worried About Charity fundraiser in the Last Year:   . Arboriculturist in the Last Year:   Transportation Needs:   . Film/video editor (Medical):   Marland Kitchen Lack of Transportation (Non-Medical):   Physical Activity:   . Days of Exercise per Week:   . Minutes of Exercise per Session:   Stress:   . Feeling of Stress :   Social Connections:   . Frequency of Communication with Friends and Family:   . Frequency of Social Gatherings with Friends and Family:   . Attends Religious Services:   . Active Member of Clubs or Organizations:   . Attends Archivist Meetings:   Marland Kitchen Marital Status:   Intimate Partner Violence:   . Fear of Current or Ex-Partner:   . Emotionally Abused:   Marland Kitchen Physically Abused:   . Sexually Abused:      BP 122/78   Pulse (!) 52   Temp (!) 96.9 F (36.1 C)   Ht 5\' 11"  (1.803 m)   Wt 214 lb (97.1 kg)   SpO2 90%   BMI 29.85 kg/m   Physical Exam:  Well appearing NAD HEENT: Unremarkable Neck:  No JVD, no thyromegally Lymphatics:  No adenopathy Back:  No CVA tenderness Lungs:  Clear with no wheezes HEART:  Regular rate rhythm, no murmurs, no rubs, no clicks Abd:  soft, positive bowel sounds, no organomegally, no rebound, no guarding Ext:  2 plus pulses, no edema, no cyanosis, no clubbing Skin:  No rashes no nodules Neuro:  CN II through XII intact, motor grossly intact  DEVICE  Normal device function.  See PaceArt for details.   Assess/Plan: 1.  Heart block - he paces about 94% in the ventricle. His escape is in the 30's.  2. 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 Chronic mostly diastolic heart failure - I suspect that he has multiple etiologies for his CHF including LV dysynchrony. He remains class 3 despite lasix. We discussed switching to a different diuretic (torsemide) or referral to the CHF clinic. With his pulmonary HTN, I would like him to see Dr. DM/DB. I encouraged a low sodium diet. He will continue his diuretic.  4. AS/MS - by echo, both of these are mild/moderate. I suspect that they are contributing but not the main cause of his symptoms.  5. 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 He has a lead across the tricuspid valve.   Mikle Bosworth.D.

## 2019-08-16 ENCOUNTER — Ambulatory Visit (INDEPENDENT_AMBULATORY_CARE_PROVIDER_SITE_OTHER): Payer: Medicare Other | Admitting: *Deleted

## 2019-08-16 ENCOUNTER — Other Ambulatory Visit: Payer: Self-pay

## 2019-08-16 DIAGNOSIS — I059 Rheumatic mitral valve disease, unspecified: Secondary | ICD-10-CM | POA: Diagnosis not present

## 2019-08-16 DIAGNOSIS — I4892 Unspecified atrial flutter: Secondary | ICD-10-CM | POA: Diagnosis not present

## 2019-08-16 DIAGNOSIS — I350 Nonrheumatic aortic (valve) stenosis: Secondary | ICD-10-CM | POA: Diagnosis not present

## 2019-08-16 DIAGNOSIS — Z01812 Encounter for preprocedural laboratory examination: Secondary | ICD-10-CM | POA: Diagnosis not present

## 2019-08-16 LAB — POCT INR: INR: 1.9 — AB (ref 2.0–3.0)

## 2019-08-16 NOTE — Patient Instructions (Signed)
Take warfarin 2 tablets tonight then increase dose to 1 1/2 tablets daily except 1 tablet on Mondays, Wednesdays and Fridays  Recheck in 4 weeks

## 2019-08-21 ENCOUNTER — Ambulatory Visit: Payer: Medicare Other | Admitting: Family Medicine

## 2019-08-21 ENCOUNTER — Ambulatory Visit (INDEPENDENT_AMBULATORY_CARE_PROVIDER_SITE_OTHER): Payer: Medicare Other | Admitting: Cardiovascular Disease

## 2019-08-21 ENCOUNTER — Other Ambulatory Visit: Payer: Self-pay

## 2019-08-21 ENCOUNTER — Encounter: Payer: Self-pay | Admitting: Cardiovascular Disease

## 2019-08-21 VITALS — BP 122/62 | HR 90 | Ht 71.5 in | Wt 227.0 lb

## 2019-08-21 DIAGNOSIS — E785 Hyperlipidemia, unspecified: Secondary | ICD-10-CM | POA: Diagnosis not present

## 2019-08-21 DIAGNOSIS — I350 Nonrheumatic aortic (valve) stenosis: Secondary | ICD-10-CM

## 2019-08-21 DIAGNOSIS — I25708 Atherosclerosis of coronary artery bypass graft(s), unspecified, with other forms of angina pectoris: Secondary | ICD-10-CM | POA: Diagnosis not present

## 2019-08-21 DIAGNOSIS — Z950 Presence of cardiac pacemaker: Secondary | ICD-10-CM | POA: Diagnosis not present

## 2019-08-21 DIAGNOSIS — I4892 Unspecified atrial flutter: Secondary | ICD-10-CM

## 2019-08-21 DIAGNOSIS — G4733 Obstructive sleep apnea (adult) (pediatric): Secondary | ICD-10-CM

## 2019-08-21 DIAGNOSIS — I442 Atrioventricular block, complete: Secondary | ICD-10-CM

## 2019-08-21 DIAGNOSIS — I10 Essential (primary) hypertension: Secondary | ICD-10-CM

## 2019-08-21 DIAGNOSIS — I272 Pulmonary hypertension, unspecified: Secondary | ICD-10-CM

## 2019-08-21 DIAGNOSIS — I5043 Acute on chronic combined systolic (congestive) and diastolic (congestive) heart failure: Secondary | ICD-10-CM

## 2019-08-21 DIAGNOSIS — Z79899 Other long term (current) drug therapy: Secondary | ICD-10-CM | POA: Diagnosis not present

## 2019-08-21 DIAGNOSIS — I5082 Biventricular heart failure: Secondary | ICD-10-CM

## 2019-08-21 DIAGNOSIS — Z9989 Dependence on other enabling machines and devices: Secondary | ICD-10-CM

## 2019-08-21 DIAGNOSIS — Z9889 Other specified postprocedural states: Secondary | ICD-10-CM | POA: Diagnosis not present

## 2019-08-21 MED ORDER — POTASSIUM CHLORIDE ER 10 MEQ PO TBCR: 20.0000 meq | EXTENDED_RELEASE_TABLET | Freq: Every day | ORAL | 3 refills | Status: AC

## 2019-08-21 MED ORDER — FUROSEMIDE 40 MG PO TABS: 40.0000 mg | ORAL_TABLET | Freq: Two times a day (BID) | ORAL | 3 refills | Status: DC

## 2019-08-21 NOTE — Patient Instructions (Signed)
Medication Instructions:   Increase Lasix (Furosemide) to 80mg  every morning & 40mg  every evening x 5 days.   After above, go back to Lasix (Furosemide) 40mg  twice a day.  Increase Potassium to 43meq twice a day x 5 days.  After above, go back to Potassium 43meq daily.   Continue all other medications.    Labwork:  BMET - order given today.   Please do in 1 week.  You may do at [facility] or [facility] will contact with results via phone or letter.    Testing/Procedures: none  Follow-Up: 3 weeks   Any Other Special Instructions Will Be Listed Below (If Applicable). Weigh daily - call the office for 3 pound weight gain in 24 hours or 5 pound weight gain in 1 week.   If you need a refill on your cardiac medications before your next appointment, please call your pharmacy.

## 2019-08-21 NOTE — Progress Notes (Signed)
SUBJECTIVE: The patient presents routine follow-up.  He saw Dr. Lovena Le on 08/14/2019 at which time he complained of increasing shortness of breath and peripheral edema.  He referred him to the CHF clinic as the patient told Dr. Lovena Le that he had been taking Lasix 40 mg twice daily.  Past medical history includes coronary disease and CABG (4-vessel CABG in June 2017), chronic diastolic heart failure, mitral valve replacement, cardiac pacemaker, and aortic stenosis.  Transesophageal echocardiogram in February 2020 demonstrated mild to moderate aortic stenosis.  Previous mitral valve repair noted with moderate mitral stenosis and regurgitation. Moderately reduced ejection fraction 50 to 55%.  It turns out the patient has only been taking Lasix 40 mg daily for at least a month if not longer.  His wife was shocked to hear this and she became very upset.  He has been having increasing exertional dyspnea.  Chronic right leg swelling after CABG is mildly worse.  His abdomen has become more distended.  He denies chest pain.  The patient's wife's 65 year old mother also lives with them.  Soc Hx:Alyson Ingles daughter committed suicide on Oct 01, 2016. Her son lives withthe patientand his wife.  They also have a granddaughter, Museum/gallery conservator, who works at [employer].  Review of Systems: As per "subjective", otherwise negative.  No Known Allergies  Current Outpatient Medications  Medication Sig Dispense Refill  . aspirin EC 81 MG tablet Take 1 tablet (81 mg total) by mouth daily. (Patient taking differently: Take 81 mg by mouth every evening. ) 90 tablet 3  . ASTAXANTHIN PO Take 10 mg by mouth daily.     Marland Kitchen atorvastatin (LIPITOR) 40 MG tablet Take 40 mg by mouth daily.     . Coenzyme Q10 200 MG capsule Take 200 mg by mouth daily.     . furosemide (LASIX) 40 MG tablet Take 1 tablet (40 mg total) by mouth 2 (two) times daily. 60 tablet 6  . metFORMIN (GLUCOPHAGE) 500 MG tablet Take 1,000 mg by mouth 2  (two) times daily with a meal.   2  . potassium chloride (KLOR-CON 10) 10 MEQ tablet Take 1 tablet (10 mEq total) by mouth 2 (two) times daily. 180 tablet 3  . RELION PRIME TEST test strip USE 1 STRIP TO CHECK GLUCOSE ONCE DAILY    . SF 5000 PLUS 1.1 % CREA dental cream Apply 1 application topically See admin instructions. Use 1 application 2 times a week at bedtime.    Marland Kitchen warfarin (COUMADIN) 5 MG tablet TAKE ONE AND ONE-HALF TABLETS BY MOUTH ON TUESDAY, THURSDAY, SATURDAY, AND ONE TABLET ON ALL OTHER DAYS 45 tablet 0  . carvedilol (COREG) 12.5 MG tablet Take 1.5 tablets (18.75 mg total) by mouth 2 (two) times daily. 270 tablet 3  . lisinopril (ZESTRIL) 5 MG tablet Take 1 tablet (5 mg total) by mouth daily. 90 tablet 3   No current facility-administered medications for this visit.    Past Medical History:  Diagnosis Date  . 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 Aortic stenosis 10/14/2015  . CAD (coronary artery disease)    status post Taxus stent patency of RCA 2004, Cardiolite negative for ischemia in 2010.  . Diabetes mellitus ([place])   . Dyslipidemia   . History of kidney stones   . Hypertension   . Iron deficiency anemia 02/02/2018  . Mitral regurgitation 10/15/2015   Moderate-severe by intra-operative TEE  . Overweight   . 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 Snores   . Typical atrial flutter [facility])    s/p ablation 11-23-2012 by Dr Rayann Heman    Past Surgical History:  Procedure Laterality Date  . ABLATION  11-23-2012   s/p cavotricuspid isthmus ablation by Dr Rayann Heman  . ANKLE SURGERY Left     total ankle replacement  . ATRIAL FLUTTER ABLATION N/A 11/23/2012   Procedure: ATRIAL FLUTTER ABLATION;  Surgeon: Thompson Grayer, MD;  Location: [facility] CATH LAB;  Service: Cardiovascular;  Laterality: N/A;  . CARDIAC CATHETERIZATION N/A 10/13/2015   Procedure: Left Heart Cath and Coronary Angiography;  Surgeon: Peter M Martinique, MD;  Location: [place] CV LAB;  Service: Cardiovascular;  Laterality: N/A;  . CATARACT EXTRACTION Right   . CATARACT EXTRACTION W/PHACO Left 09/13/2013   Procedure: CATARACT EXTRACTION PHACO AND INTRAOCULAR LENS PLACEMENT (IOC);  Surgeon: Tonny Branch, MD;  Location: [facility] ORS;  Service: Ophthalmology;  Laterality: Left;  CDE 9.38  . COLONOSCOPY N/A 10/10/2014   Procedure: COLONOSCOPY;  Surgeon: Rogene Houston, MD;  [facility] ENDO SUITE;  Service: Endoscopy;  Laterality: N/A;  830  . CORONARY ANGIOPLASTY     3 stents  . CORONARY ARTERY BYPASS GRAFT N/A 10/15/2015   Procedure: CORONARY ARTERY BYPASS GRAFTING (CABG) X4 LIMA-LAD; SEQ SVG-PD-PL; SVG-OM1 ENDOSCOPIC GREATER SAPHENOUS VEIN HARVEST(EVH) BILAT LOWER EXTREM;  Surgeon: Rexene Alberts, MD;  Location: [place];  Service: Open Heart Surgery;  Laterality: N/A;  . EP IMPLANTABLE DEVICE N/A 10/20/2015   Procedure: Pacemaker Implant;  Surgeon: Evans Lance, MD;  Location: [place] CV LAB;  Service: Cardiovascular;  Laterality: N/A;  . MITRAL VALVE REPAIR N/A 10/15/2015   Procedure: MITRAL VALVE REPAIR (MVR), # 28 MEMO 3-D RING ANNULOPLASTY AND COMPLEX VALVE REPAIR;  Surgeon: Rexene Alberts, MD;  Location: [place];  Service: Open Heart Surgery;  Laterality: N/A;  . TEE WITHOUT CARDIOVERSION N/A 10/15/2015   Procedure: TRANSESOPHAGEAL ECHOCARDIOGRAM (TEE);  Surgeon: Rexene Alberts, MD;  Location: [place];  Service: Open Heart Surgery;  Laterality: N/A;  . TEE WITHOUT CARDIOVERSION N/A 09/16/2017   Procedure: TRANSESOPHAGEAL ECHOCARDIOGRAM (TEE);  Surgeon: Dorothy Spark, MD;  Location: [facility] ENDOSCOPY;  Service: Cardiovascular;   Laterality: N/A;  . TEE WITHOUT CARDIOVERSION N/A 06/26/2018   Procedure: TRANSESOPHAGEAL ECHOCARDIOGRAM (TEE);  Surgeon: [name], MD;  Location: [place];  Service: Cardiology;  Laterality: N/A;    Social History   Socioeconomic History  . Marital status: Married    Spouse name: Not on file  . Number of children: 1  . Years of education: Not on file  . Highest education level: Not on file  Occupational History  . 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 Smoking status: Former Smoker    Packs/day: 1.00    Years: 28.00    Pack years: 28.00    Types: Cigarettes    Start date: 10/21/1956    Quit date: 05/10/1985    Years since quitting: 34.3  . Smokeless tobacco: Never Used  Substance and Sexual Activity  . Alcohol use: Not Currently    Alcohol/week: 0.0 standard drinks    Comment:  occasional wine  . Drug use: No  . Sexual activity: Yes    Birth control/protection: None  Other Topics Concern  . Not on file  Social History Narrative   Lives in [place] with spouse.   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 Difficulty of Paying Living Expenses:   Food Insecurity:   . Worried About Charity fundraiser in the Last Year:   . Arboriculturist in the Last Year:   Transportation Needs:   . Film/video editor (Medical):   Marland Kitchen Lack of Transportation (Non-Medical):   Physical Activity:   . Days of Exercise per Week:   . Minutes of Exercise per Session:   Stress:   . Feeling of Stress :   Social Connections:   . Frequency of Communication with Friends and Family:   . Frequency of Social Gatherings with Friends and Family:   . Attends Religious Services:   . Active Member of Clubs or Organizations:   . Attends Archivist Meetings:   Marland Kitchen Marital Status:   Intimate Partner Violence:   . Fear of Current or Ex-Partner:   . Emotionally Abused:   Marland Kitchen Physically Abused:   . Sexually  Abused:       Vitals:   08/21/19 1532  BP: 122/62  Pulse: 90  SpO2: 92%  Weight: 227 lb (103 kg)  Height: 5' 11.5" (1.816 m)    Wt Readings from Last 3 Encounters:  08/21/19 227 lb (103 kg)  08/14/19 214 lb (97.1 kg)  05/02/19 207 lb (93.9 kg)     PHYSICAL EXAM General: NAD HEENT: Normal. Neck: No JVD, no thyromegaly. Lungs: Clear to auscultation bilaterally with normal respiratory effort. CV: Regular rate and irregular rhythm, normal S1/S2, no S3, no murmur.  Trace left lower extremity edema.  No carotid bruit.   Abdomen: Soft, nontender, mild distention.  Neurologic: Alert and oriented.  Psych: Normal affect. Skin: Normal. Musculoskeletal: No gross deformities.      Labs: Lab Results  Component Value Date/Time   K 4.4 04/23/2019 12:35 PM   BUN 39 (H) 04/23/2019 12:35 PM   BUN 21 09/12/2017 09:58 AM   CREATININE 1.38 (H) 04/23/2019 12:35 PM   ALT 16 04/23/2019 12:35 PM   TSH 4.924 (H) 03/18/2016 09:18 PM   HGB 12.8 (L) 04/23/2019 12:35 PM   HGB 12.9 (L) 09/12/2017 09:58 AM     Lipids: Lab Results  Component Value Date/Time   LDLCALC 43 10/14/2015 04:31 AM   CHOL 108 10/14/2015 04:31 AM   TRIG 157 (H) 10/14/2015 04:31 AM   HDL 34 (L) 10/14/2015 04:31 AM      Echocardiogram 08/01/2019:  1. Left ventricular ejection fraction, by estimation, is 45 to 50%. The  left ventricle has mildly decreased function. The left ventricle  demonstrates global hypokinesis. Left ventricular diastolic parameters are  indeterminate.  2. Right ventricular systolic function is moderately reduced. The right  ventricular size is moderately enlarged. There is moderately elevated  pulmonary artery systolic pressure.  3. History of MV repair with 28 mm Sorin Memo 3D ring annuloplasty . The  mitral valve has been repaired/replaced. No evidence of mitral valve  regurgitation. Mild to moderate mitral stenosis.  4. TR appears moderate by color Doppler. There is hepatic  systolic flow  reversal suggesting more severe TR. . Tricuspid valve regurgitation is  moderate.  5. The aortic valve has an indeterminant number  of cusps. Aortic valve  regurgitation is not visualized. Moderate aortic valve stenosis.  6. Moderate pulmonary HTN, PASP is 64 mmHg.  7. The inferior vena cava is dilated in size with <50% respiratory  variability, suggesting right atrial pressure of 15 mmHg.     ASSESSMENT AND PLAN:  1. CAD s/p 4-v CABG(June 2017):Symptomatically stable.Continue aspirin, Lipitor, Coreg, and lisinopril.   2. S/p mitral valve repair: Mild to moderatestenosis by echocardiogram on 08/01/2019.  3. S/p PPM for CHB: Stable.Device is functioning normally. F/u with Dr. Lovena Le.  4. Essential QC:[id number] pressure is normal.  No changes to therapy.  5. Hyperlipidemia:ContinueLipitor.  6.  Acute on chronic combined systolic and diastolic and right heart failure/biventricular heart failure: LVEF 45 to 50% by echocardiogram on 08/01/2019 with RV enlargement and moderately reduced systolic function.  Weight is up 20 pounds since late December 2020.  It appears he has only been taking Lasix 40 mg daily for over a month.  Currently on carvedilol and lisinopril. I will increase Lasix to 80 mg every morning and 40 mg every afternoon for 5 days.  I will increase potassium chloride to 20 mEq twice daily.  I will check a basic metabolic panel in 1 week.  After 5 days I will reduce Lasix to 40 mg twice daily and potassium chloride to 20 mEq daily.  I asked him to weigh himself daily.  7. Atrial flutter:Anticoagulated withwarfarin.  8. Aortic stenosis:Moderate aortic stenosis by echocardiogram on 08/01/2019.  I will monitor.  9.  Obstructive sleep apnea: Encouraged to use CPAP daily.  10.  Pulmonary hypertension: Pulmonary pressures are 64 mmHg.  There is likely moderate to severe tricuspid regurgitation with RV enlargement and dysfunction.  I will switch  Lasix to torsemide.  He has a pacemaker lead across the tricuspid valve which is contributing to valvular pathology.    Disposition: Follow up 3 weeks virtual visit  A high level of decision making was required for increased medical complexities.    Kate Sable, M.D., F.A.C.C.

## 2019-08-26 ENCOUNTER — Other Ambulatory Visit: Payer: Self-pay | Admitting: Cardiovascular Disease

## 2019-08-29 ENCOUNTER — Inpatient Hospital Stay (HOSPITAL_COMMUNITY): Payer: Medicare Other | Attending: Hematology

## 2019-08-29 ENCOUNTER — Other Ambulatory Visit: Payer: Self-pay

## 2019-08-29 ENCOUNTER — Ambulatory Visit (HOSPITAL_COMMUNITY): Admission: RE | Admit: 2019-08-29 | Payer: Medicare Other | Source: Ambulatory Visit

## 2019-08-29 DIAGNOSIS — Z79899 Other long term (current) drug therapy: Secondary | ICD-10-CM | POA: Diagnosis not present

## 2019-08-29 DIAGNOSIS — D508 Other iron deficiency anemias: Secondary | ICD-10-CM

## 2019-08-29 DIAGNOSIS — J984 Other disorders of lung: Secondary | ICD-10-CM | POA: Insufficient documentation

## 2019-08-29 DIAGNOSIS — R911 Solitary pulmonary nodule: Secondary | ICD-10-CM | POA: Diagnosis not present

## 2019-08-29 DIAGNOSIS — I129 Hypertensive chronic kidney disease with stage 1 through stage 4 chronic kidney disease, or unspecified chronic kidney disease: Secondary | ICD-10-CM | POA: Insufficient documentation

## 2019-08-29 DIAGNOSIS — N189 Chronic kidney disease, unspecified: Secondary | ICD-10-CM | POA: Diagnosis not present

## 2019-08-29 DIAGNOSIS — D509 Iron deficiency anemia, unspecified: Secondary | ICD-10-CM | POA: Insufficient documentation

## 2019-08-29 LAB — CBC WITH DIFFERENTIAL/PLATELET
Abs Immature Granulocytes: 0.03 10*3/uL (ref 0.00–0.07)
Basophils Absolute: 0 10*3/uL (ref 0.0–0.1)
Basophils Relative: 0 %
Eosinophils Absolute: 0.1 10*3/uL (ref 0.0–0.5)
Eosinophils Relative: 1 %
HCT: 40.2 % (ref 39.0–52.0)
Hemoglobin: 12.6 g/dL — ABNORMAL LOW (ref 13.0–17.0)
Immature Granulocytes: 0 %
Lymphocytes Relative: 9 %
Lymphs Abs: 0.7 10*3/uL (ref 0.7–4.0)
MCH: 30.2 pg (ref 26.0–34.0)
MCHC: 31.3 g/dL (ref 30.0–36.0)
MCV: 96.4 fL (ref 80.0–100.0)
Monocytes Absolute: 0.8 10*3/uL (ref 0.1–1.0)
Monocytes Relative: 10 %
Neutro Abs: 6.2 10*3/uL (ref 1.7–7.7)
Neutrophils Relative %: 80 %
Platelets: 191 10*3/uL (ref 150–400)
RBC: 4.17 MIL/uL — ABNORMAL LOW (ref 4.22–5.81)
RDW: 18.6 % — ABNORMAL HIGH (ref 11.5–15.5)
WBC: 7.7 10*3/uL (ref 4.0–10.5)
nRBC: 0 % (ref 0.0–0.2)

## 2019-08-29 LAB — COMPREHENSIVE METABOLIC PANEL
ALT: 15 U/L (ref 0–44)
AST: 18 U/L (ref 15–41)
Albumin: 3.7 g/dL (ref 3.5–5.0)
Alkaline Phosphatase: 141 U/L — ABNORMAL HIGH (ref 38–126)
Anion gap: 10 (ref 5–15)
BUN: 45 mg/dL — ABNORMAL HIGH (ref 8–23)
CO2: 28 mmol/L (ref 22–32)
Calcium: 9.6 mg/dL (ref 8.9–10.3)
Chloride: 104 mmol/L (ref 98–111)
Creatinine, Ser: 1.42 mg/dL — ABNORMAL HIGH (ref 0.61–1.24)
GFR calc Af Amer: 55 mL/min — ABNORMAL LOW (ref >60)
GFR calc non Af Amer: 48 mL/min — ABNORMAL LOW (ref >60)
Glucose, Bld: 102 mg/dL — ABNORMAL HIGH (ref 70–99)
Potassium: 4.2 mmol/L (ref 3.5–5.1)
Sodium: 142 mmol/L (ref 135–145)
Total Bilirubin: 1.2 mg/dL (ref 0.3–1.2)
Total Protein: 7.3 g/dL (ref 6.5–8.1)

## 2019-08-29 LAB — VITAMIN D 25 HYDROXY (VIT D DEFICIENCY, FRACTURES): Vit D, 25-Hydroxy: 46.58 ng/mL (ref 30–100)

## 2019-08-29 LAB — FERRITIN: Ferritin: 80 ng/mL (ref 24–336)

## 2019-08-29 LAB — IRON AND TIBC
Iron: 59 ug/dL (ref 45–182)
Saturation Ratios: 15 % — ABNORMAL LOW (ref 17.9–39.5)
TIBC: 405 ug/dL (ref 250–450)
UIBC: 346 ug/dL

## 2019-08-29 LAB — FOLATE: Folate: 9 ng/mL (ref >5.9)

## 2019-08-29 LAB — VITAMIN B12: Vitamin B-12: 3415 pg/mL — ABNORMAL HIGH (ref 180–914)

## 2019-08-29 LAB — LACTATE DEHYDROGENASE: LDH: 157 U/L (ref 98–192)

## 2019-08-30 ENCOUNTER — Other Ambulatory Visit: Payer: Self-pay

## 2019-08-30 ENCOUNTER — Inpatient Hospital Stay (HOSPITAL_BASED_OUTPATIENT_CLINIC_OR_DEPARTMENT_OTHER): Payer: Medicare Other | Admitting: Hematology

## 2019-08-30 ENCOUNTER — Encounter (HOSPITAL_COMMUNITY): Payer: Self-pay | Admitting: Hematology

## 2019-08-30 VITALS — BP 106/82 | HR 70 | Temp 96.9°F | Resp 18 | Wt 215.0 lb

## 2019-08-30 DIAGNOSIS — I129 Hypertensive chronic kidney disease with stage 1 through stage 4 chronic kidney disease, or unspecified chronic kidney disease: Secondary | ICD-10-CM | POA: Diagnosis not present

## 2019-08-30 DIAGNOSIS — R911 Solitary pulmonary nodule: Secondary | ICD-10-CM | POA: Diagnosis not present

## 2019-08-30 DIAGNOSIS — Z79899 Other long term (current) drug therapy: Secondary | ICD-10-CM | POA: Diagnosis not present

## 2019-08-30 DIAGNOSIS — J984 Other disorders of lung: Secondary | ICD-10-CM | POA: Diagnosis not present

## 2019-08-30 DIAGNOSIS — D508 Other iron deficiency anemias: Secondary | ICD-10-CM | POA: Diagnosis not present

## 2019-08-30 DIAGNOSIS — D509 Iron deficiency anemia, unspecified: Secondary | ICD-10-CM | POA: Diagnosis not present

## 2019-08-30 DIAGNOSIS — N189 Chronic kidney disease, unspecified: Secondary | ICD-10-CM | POA: Diagnosis not present

## 2019-08-30 NOTE — Assessment & Plan Note (Addendum)
1.  Normocytic anemia: -This is from combination of CKD and relative iron deficiency. -Last Injectafer was on 02/10/2018. -We reviewed labs from 08/29/2019.  Hemoglobin is 12.6 and MCV of 96.  Creatinine is 1.42.  White count and platelet count was normal. -Ferritin was 80, decreased from 99.  Percent saturation was 15.  Vitamin 123456 and folic acid levels were normal. -He is complaining of severe drop of energy.  We will schedule him for Feraheme weekly x2. -I will see him back in 2 months with repeat labs.  2.  Right lung nodule: -CT of the chest with contrast dated 08/22/2018 shows right middle lobe lung nodule unchanged, benign. -Posterior lateral left upper lobe pleural-based soft tissue density measuring 2 cm is favored to represent an area of round atelectasis or scarring.  There is also unchanged since April 2019. -He has not had follow-up CT scan done.  We will schedule it and see him in 2 months.

## 2019-08-30 NOTE — Progress Notes (Signed)
[place], [place] [address]   CLINIC:  Medical Oncology/Hematology  PCP:  Monico Blitz, MD [place] [address] [phone]   REASON FOR VISIT:  Iron deficiency anemia lung nodule.     INTERVAL HISTORY:  Christian Rasmussen 79 y.o. male seen for follow-up of iron deficiency anemia and lung nodule.  Appetite is".  Energy levels are severely low.  Reports shortness of breath on walking.  Denies any bleeding per rectum or melena.  No recent hospitalizations or infections noted.Marland Kitchen     REVIEW OF SYSTEMS:  Review of Systems  Constitutional: Positive for fatigue.  Respiratory: Positive for shortness of breath.      PAST MEDICAL/SURGICAL HISTORY:  Past Medical History:  Diagnosis Date  . 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 Aortic stenosis 10/14/2015  . CAD (coronary artery disease)    status post Taxus stent patency of RCA 2004, Cardiolite negative for ischemia in 2010.  . Diabetes mellitus ([place])   . Dyslipidemia   . History of kidney stones   . Hypertension   . Iron deficiency anemia 02/02/2018  . Mitral regurgitation 10/15/2015   Moderate-severe by intra-operative TEE  . Overweight   . Right bundle branch block (RBBB) with left anterior hemiblock   . 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 Snores   . Typical atrial flutter [facility])    s/p ablation 11-23-2012 by Dr Rayann Heman   Past Surgical History:  Procedure Laterality Date  . ABLATION  11-23-2012   s/p cavotricuspid isthmus ablation by Dr Rayann Heman  . ANKLE SURGERY Left    total ankle replacement  . ATRIAL FLUTTER ABLATION N/A 11/23/2012   Procedure: ATRIAL  FLUTTER ABLATION;  Surgeon: Thompson Grayer, MD;  Location: [facility] CATH LAB;  Service: Cardiovascular;  Laterality: N/A;  . CARDIAC CATHETERIZATION N/A 10/13/2015   Procedure: Left Heart Cath and Coronary Angiography;  Surgeon: Peter M Martinique, MD;  Location: [place] CV LAB;  Service: Cardiovascular;  Laterality: N/A;  . CATARACT EXTRACTION Right   . CATARACT EXTRACTION W/PHACO Left 09/13/2013   Procedure: CATARACT EXTRACTION PHACO AND INTRAOCULAR LENS PLACEMENT (IOC);  Surgeon: Tonny Branch, MD;  Location: [facility] ORS;  Service: Ophthalmology;  Laterality: Left;  CDE 9.38  . COLONOSCOPY N/A 10/10/2014   Procedure: COLONOSCOPY;  Surgeon: Rogene Houston, MD;  [facility] ENDO [facility];  Service: Endoscopy;  Laterality: N/A;  830  . CORONARY ANGIOPLASTY     3 stents  . CORONARY ARTERY BYPASS GRAFT N/A 10/15/2015   Procedure: CORONARY ARTERY BYPASS GRAFTING (CABG) X4 LIMA-LAD; SEQ SVG-PD-PL; SVG-OM1 ENDOSCOPIC GREATER SAPHENOUS VEIN HARVEST(EVH) BILAT LOWER EXTREM;  Surgeon: Rexene Alberts, MD;  Location: [place];  Service: Open Heart Surgery;  Laterality: N/A;  . EP IMPLANTABLE DEVICE N/A 10/20/2015   Procedure: Pacemaker Implant;  Surgeon: Evans Lance, MD;  Location: [place] CV LAB;  Service: Cardiovascular;  Laterality: N/A;  . MITRAL VALVE REPAIR N/A 10/15/2015   Procedure: MITRAL VALVE REPAIR (MVR), # 28 MEMO 3-D RING ANNULOPLASTY AND COMPLEX VALVE REPAIR;  Surgeon: Rexene Alberts, MD;  Location: [place];  Service: Open Heart Surgery;  Laterality: N/A;  .  TEE WITHOUT CARDIOVERSION N/A 10/15/2015   Procedure: TRANSESOPHAGEAL ECHOCARDIOGRAM (TEE);  Surgeon: Rexene Alberts, MD;  Location: [place];  Service: Open Heart Surgery;  Laterality: N/A;  . TEE WITHOUT CARDIOVERSION N/A 09/16/2017   Procedure: TRANSESOPHAGEAL ECHOCARDIOGRAM (TEE);  Surgeon: Dorothy Spark, MD;  Location: [facility] ENDOSCOPY;  Service: Cardiovascular;  Laterality: N/A;  . TEE WITHOUT CARDIOVERSION N/A 06/26/2018   Procedure: TRANSESOPHAGEAL  ECHOCARDIOGRAM (TEE);  Surgeon: [name], MD;  Location: [place];  Service: Cardiology;  Laterality: N/A;     SOCIAL HISTORY:  Social History   Socioeconomic History  . Marital status: Married    Spouse name: Not on file  . Number of children: 1  . Years of education: Not on file  . Highest education level: Not on file  Occupational History  . 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 Smoking status: Former Smoker    Packs/day: 1.00    Years: 28.00    Pack years: 28.00    Types: Cigarettes    Start date: 10/21/1956    Quit date: 05/10/1985    Years since quitting: 34.3  . Smokeless tobacco: Never Used  Substance and Sexual Activity  . Alcohol use: Not Currently    Alcohol/week: 0.0 standard drinks    Comment: occasional wine  . Drug use: No  . Sexual activity: Yes    Birth control/protection: None  Other Topics Concern  . Not on file  Social History Narrative   Lives in [place] with spouse.   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 Difficulty of Paying Living Expenses:   Food Insecurity:   . Worried About Charity fundraiser in the Last Year:   . Arboriculturist in the Last Year:   Transportation Needs:   . Film/video editor (Medical):   Marland Kitchen Lack of Transportation (Non-Medical):   Physical Activity:   . Days of Exercise per Week:   . Minutes of Exercise per Session:   Stress:   . Feeling of Stress :   Social Connections:   . Frequency of Communication with Friends and Family:   . Frequency of Social Gatherings with Friends and Family:   . Attends Religious Services:   . Active Member of Clubs or Organizations:   . Attends Archivist Meetings:   Marland Kitchen Marital Status:   Intimate Partner Violence:   . Fear of Current or Ex-Partner:   . Emotionally Abused:   Marland Kitchen Physically Abused:   . Sexually Abused:     FAMILY HISTORY:  Family History  Problem Relation Age of  Onset  . Other Father 50       Died from MI.  . Other Mother        alive & well  . Leukemia Brother 54       died    CURRENT MEDICATIONS:  Outpatient Encounter Medications as of 08/30/2019  Medication Sig  . aspirin EC 81 MG tablet Take 1 tablet (81 mg total) by mouth daily. (Patient taking differently: Take 81 mg by mouth every evening. )  . ASTAXANTHIN PO Take 10 mg by mouth daily.   Marland Kitchen atorvastatin (LIPITOR) 40 MG tablet Take 40 mg by mouth daily.   . carvedilol (COREG) 12.5 MG tablet TAKE 1 & 1/2 (ONE & ONE-HALF) TABLETS BY MOUTH TWICE DAILY  . Coenzyme Q10 200 MG capsule Take 200 mg by mouth daily.   Marland Kitchen  furosemide (LASIX) 40 MG tablet Take 1 tablet (40 mg total) by mouth 2 (two) times daily.  Marland Kitchen lisinopril (ZESTRIL) 5 MG tablet Take 1 tablet (5 mg total) by mouth daily.  . metFORMIN (GLUCOPHAGE) 500 MG tablet Take 1,000 mg by mouth 2 (two) times daily with a meal.   . potassium chloride (KLOR-CON 10) 10 MEQ tablet Take 2 tablets (20 mEq total) by mouth daily.  Marland Kitchen RELION PRIME TEST test strip USE 1 STRIP TO CHECK GLUCOSE ONCE DAILY  . SF 5000 PLUS 1.1 % CREA dental cream Apply 1 application topically See admin instructions. Use 1 application 2 times a week at bedtime.  . [DISCONTINUED] warfarin (COUMADIN) 5 MG tablet TAKE ONE AND ONE-HALF TABLETS BY MOUTH ON TUESDAY, THURSDAY, SATURDAY, AND ONE TABLET ON ALL OTHER DAYS  . [DISCONTINUED] potassium chloride (KLOR-CON) 10 MEQ tablet    No facility-administered encounter medications on file as of 08/30/2019.    ALLERGIES:  No Known Allergies   PHYSICAL EXAM:  ECOG Performance status: 1  Vitals:   08/30/19 1510  BP: 106/82  Pulse: 70  Resp: 18  Temp: (!) 96.9 F (36.1 C)  SpO2: 96%   Filed Weights   08/30/19 1510  Weight: 215 lb (97.5 kg)    Physical Exam Vitals reviewed.  Constitutional:      Appearance: Normal appearance.  Cardiovascular:     Rate and Rhythm: Normal rate and regular rhythm.     Heart sounds: Normal  heart sounds.  Pulmonary:     Effort: Pulmonary effort is normal.     Breath sounds: Normal breath sounds.  Abdominal:     General: There is no distension.     Palpations: Abdomen is soft. There is no mass.  Musculoskeletal:        General: No swelling.  Skin:    General: Skin is warm.  Neurological:     General: No focal deficit present.     Mental Status: He is alert and oriented to person, place, and time.  Psychiatric:        Mood and Affect: Mood normal.        Behavior: Behavior normal.      LABORATORY DATA:  I have reviewed the labs as listed.  CBC    Component Value Date/Time   WBC 7.7 08/29/2019 0859   RBC 4.17 (L) 08/29/2019 0859   HGB 12.6 (L) 08/29/2019 0859   HGB 12.9 (L) 09/12/2017 0958   HCT 40.2 08/29/2019 0859   HCT 38.2 09/12/2017 0958   PLT 191 08/29/2019 0859   PLT 323 09/12/2017 0958   MCV 96.4 08/29/2019 0859   MCV 87 09/12/2017 0958   MCH 30.2 08/29/2019 0859   MCHC 31.3 08/29/2019 0859   RDW 18.6 (H) 08/29/2019 0859   RDW 16.1 (H) 09/12/2017 0958   LYMPHSABS 0.7 08/29/2019 0859   MONOABS 0.8 08/29/2019 0859   EOSABS 0.1 08/29/2019 0859   BASOSABS 0.0 08/29/2019 0859   CMP Latest Ref Rng & Units 08/29/2019 04/23/2019 08/22/2018  Glucose 70 - 99 mg/dL 102(H) 108(H) 126(H)  BUN 8 - 23 mg/dL 45(H) 39(H) 32(H)  Creatinine 0.61 - 1.24 mg/dL 1.42(H) 1.38(H) 1.17  Sodium 135 - 145 mmol/L 142 143 138  Potassium 3.5 - 5.1 mmol/L 4.2 4.4 5.3(H)  Chloride 98 - 111 mmol/L 104 103 104  CO2 22 - 32 mmol/L 28 28 24   Calcium 8.9 - 10.3 mg/dL 9.6 9.9 9.5  Total Protein 6.5 - 8.1 g/dL 7.3 7.8 7.4  Total Bilirubin 0.3 - 1.2 mg/dL 1.2 0.9 1.1  Alkaline Phos 38 - 126 U/L 141(H) 130(H) 91  AST 15 - 41 U/L 18 18 15   ALT 0 - 44 U/L 15 16 17        DIAGNOSTIC IMAGING:  I have reviewed his previous scans.   I have reviewed Venita Lick LPN's note and agree with the documentation.  I personally performed a face-to-face visit, made revisions and my  assessment and plan is as follows.    ASSESSMENT & PLAN:   Iron deficiency anemia 1.  Normocytic anemia: -This is from combination of CKD and relative iron deficiency. -Last Injectafer was on 02/10/2018. -We reviewed labs from 08/29/2019.  Hemoglobin is 12.6 and MCV of 96.  Creatinine is 1.42.  White count and platelet count was normal. -Ferritin was 80, decreased from 99.  Percent saturation was 15.  Vitamin 123456 and folic acid levels were normal. -He is complaining of severe drop of energy.  We will schedule him for Feraheme weekly x2. -I will see him back in 2 months with repeat labs.  2.  Right lung nodule: -CT of the chest with contrast dated 08/22/2018 shows right middle lobe lung nodule unchanged, benign. -Posterior lateral left upper lobe pleural-based soft tissue density measuring 2 cm is favored to represent an area of round atelectasis or scarring.  There is also unchanged since April 2019. -He has not had follow-up CT scan done.  We will schedule it and see him in 2 months.      Orders placed this encounter:  Orders Placed This Encounter  Procedures  . CBC with Differential/Platelet  . Comprehensive metabolic panel  . Iron and TIBC  . Ferritin      Christian Jack, MD [place] [phone]

## 2019-08-30 NOTE — Patient Instructions (Addendum)
[facility] at [facility] Discharge Instructions  You were seen today by Dr. Katragadda. He went over your recent lab results. He will schedule you for 2 weekly doses of IV Iron. He will see you back in 3 months for labs and follow up.   Thank you for choosing [facility] at [facility] to provide your oncology and hematology care.  To afford each patient quality time with our provider, please arrive at least 15 minutes before your scheduled appointment time.   If you have a lab appointment with the Cancer Center please come in thru the  Main Entrance and check in at the main information desk  You need to re-schedule your appointment should you arrive 10 or more minutes late.  We strive to give you quality time with our providers, and arriving late affects you and other patients whose appointments are after yours.  Also, if you no show three or more times for appointments you may be dismissed from the clinic at the providers discretion.     Again, thank you for choosing [facility].  Our hope is that these requests will decrease the amount of time that you wait before being seen by our physicians.       _____________________________________________________________  Should you have questions after your visit to [facility], please contact our office at [phone] between the hours of 8:00 a.m. and 4:30 p.m.  Voicemails left after 4:00 p.m. will not be returned until the following business day.  For prescription refill requests, have your pharmacy contact our office and allow 72 hours.    Cancer Center Support Programs:   > Cancer Support Group  2nd Tuesday of the month 1pm-2pm, Journey Room    

## 2019-09-03 ENCOUNTER — Other Ambulatory Visit: Payer: Self-pay | Admitting: Cardiology

## 2019-09-06 ENCOUNTER — Encounter (HOSPITAL_COMMUNITY): Payer: Self-pay

## 2019-09-06 ENCOUNTER — Inpatient Hospital Stay (HOSPITAL_COMMUNITY): Payer: Medicare Other

## 2019-09-06 ENCOUNTER — Other Ambulatory Visit: Payer: Self-pay

## 2019-09-06 VITALS — BP 105/58 | HR 76 | Temp 97.5°F | Resp 18

## 2019-09-06 DIAGNOSIS — D508 Other iron deficiency anemias: Secondary | ICD-10-CM

## 2019-09-06 DIAGNOSIS — D509 Iron deficiency anemia, unspecified: Secondary | ICD-10-CM | POA: Diagnosis not present

## 2019-09-06 DIAGNOSIS — Z79899 Other long term (current) drug therapy: Secondary | ICD-10-CM | POA: Diagnosis not present

## 2019-09-06 DIAGNOSIS — N189 Chronic kidney disease, unspecified: Secondary | ICD-10-CM | POA: Diagnosis not present

## 2019-09-06 DIAGNOSIS — I129 Hypertensive chronic kidney disease with stage 1 through stage 4 chronic kidney disease, or unspecified chronic kidney disease: Secondary | ICD-10-CM | POA: Diagnosis not present

## 2019-09-06 DIAGNOSIS — R911 Solitary pulmonary nodule: Secondary | ICD-10-CM | POA: Diagnosis not present

## 2019-09-06 DIAGNOSIS — J984 Other disorders of lung: Secondary | ICD-10-CM | POA: Diagnosis not present

## 2019-09-06 MED ORDER — SODIUM CHLORIDE 0.9 % IV SOLN
510.0000 mg | Freq: Once | INTRAVENOUS | Status: AC
  Administered 2019-09-06: 510 mg via INTRAVENOUS
  Filled 2019-09-06: qty 510

## 2019-09-06 MED ORDER — SODIUM CHLORIDE 0.9 % IV SOLN: Freq: Once | INTRAVENOUS | Status: AC

## 2019-09-06 NOTE — Progress Notes (Signed)
Patient tolerated iron infusion with no complaints voiced.  Peripheral IV site clean and dry with good blood return noted before and after infusion.  Band aid applied.  VSS with discharge and left ambulatory with no s/s of distress noted.  

## 2019-09-13 ENCOUNTER — Telehealth (INDEPENDENT_AMBULATORY_CARE_PROVIDER_SITE_OTHER): Payer: Medicare Other | Admitting: Cardiovascular Disease

## 2019-09-13 ENCOUNTER — Encounter (HOSPITAL_COMMUNITY): Payer: Self-pay

## 2019-09-13 ENCOUNTER — Other Ambulatory Visit: Payer: Self-pay

## 2019-09-13 ENCOUNTER — Inpatient Hospital Stay (HOSPITAL_COMMUNITY): Payer: Medicare Other | Attending: Hematology

## 2019-09-13 ENCOUNTER — Other Ambulatory Visit: Payer: Self-pay | Admitting: *Deleted

## 2019-09-13 ENCOUNTER — Other Ambulatory Visit (HOSPITAL_COMMUNITY)
Admission: RE | Admit: 2019-09-13 | Discharge: 2019-09-13 | Disposition: A | Discharge status: Home / Self Care | Payer: Medicare Other | Source: Ambulatory Visit | Attending: Cardiovascular Disease | Admitting: Cardiovascular Disease

## 2019-09-13 ENCOUNTER — Telehealth: Payer: Self-pay | Admitting: Cardiovascular Disease

## 2019-09-13 ENCOUNTER — Encounter: Payer: Self-pay | Admitting: Cardiovascular Disease

## 2019-09-13 ENCOUNTER — Encounter: Payer: Self-pay | Admitting: *Deleted

## 2019-09-13 ENCOUNTER — Ambulatory Visit (INDEPENDENT_AMBULATORY_CARE_PROVIDER_SITE_OTHER): Payer: Medicare Other | Admitting: *Deleted

## 2019-09-13 VITALS — BP 110/54 | HR 66 | Ht 71.5 in | Wt 215.0 lb

## 2019-09-13 VITALS — BP 102/69 | HR 84 | Temp 97.1°F | Resp 18

## 2019-09-13 DIAGNOSIS — I050 Rheumatic mitral stenosis: Secondary | ICD-10-CM

## 2019-09-13 DIAGNOSIS — E785 Hyperlipidemia, unspecified: Secondary | ICD-10-CM

## 2019-09-13 DIAGNOSIS — R0600 Dyspnea, unspecified: Secondary | ICD-10-CM | POA: Diagnosis not present

## 2019-09-13 DIAGNOSIS — I5082 Biventricular heart failure: Secondary | ICD-10-CM

## 2019-09-13 DIAGNOSIS — I350 Nonrheumatic aortic (valve) stenosis: Secondary | ICD-10-CM | POA: Diagnosis not present

## 2019-09-13 DIAGNOSIS — D508 Other iron deficiency anemias: Secondary | ICD-10-CM

## 2019-09-13 DIAGNOSIS — I359 Nonrheumatic aortic valve disorder, unspecified: Secondary | ICD-10-CM

## 2019-09-13 DIAGNOSIS — Z951 Presence of aortocoronary bypass graft: Secondary | ICD-10-CM

## 2019-09-13 DIAGNOSIS — R0609 Other forms of dyspnea: Secondary | ICD-10-CM

## 2019-09-13 DIAGNOSIS — I5042 Chronic combined systolic (congestive) and diastolic (congestive) heart failure: Secondary | ICD-10-CM

## 2019-09-13 DIAGNOSIS — I272 Pulmonary hypertension, unspecified: Secondary | ICD-10-CM | POA: Diagnosis not present

## 2019-09-13 DIAGNOSIS — D509 Iron deficiency anemia, unspecified: Secondary | ICD-10-CM | POA: Diagnosis not present

## 2019-09-13 DIAGNOSIS — Z950 Presence of cardiac pacemaker: Secondary | ICD-10-CM | POA: Diagnosis not present

## 2019-09-13 DIAGNOSIS — Z9889 Other specified postprocedural states: Secondary | ICD-10-CM

## 2019-09-13 DIAGNOSIS — I10 Essential (primary) hypertension: Secondary | ICD-10-CM | POA: Diagnosis not present

## 2019-09-13 DIAGNOSIS — I25708 Atherosclerosis of coronary artery bypass graft(s), unspecified, with other forms of angina pectoris: Secondary | ICD-10-CM

## 2019-09-13 DIAGNOSIS — Z7901 Long term (current) use of anticoagulants: Secondary | ICD-10-CM

## 2019-09-13 DIAGNOSIS — I4892 Unspecified atrial flutter: Secondary | ICD-10-CM | POA: Diagnosis not present

## 2019-09-13 DIAGNOSIS — I059 Rheumatic mitral valve disease, unspecified: Secondary | ICD-10-CM

## 2019-09-13 DIAGNOSIS — Z01812 Encounter for preprocedural laboratory examination: Secondary | ICD-10-CM

## 2019-09-13 DIAGNOSIS — N179 Acute kidney failure, unspecified: Secondary | ICD-10-CM

## 2019-09-13 DIAGNOSIS — G4733 Obstructive sleep apnea (adult) (pediatric): Secondary | ICD-10-CM | POA: Diagnosis not present

## 2019-09-13 DIAGNOSIS — Z9989 Dependence on other enabling machines and devices: Secondary | ICD-10-CM

## 2019-09-13 LAB — BASIC METABOLIC PANEL
Anion gap: 13 (ref 5–15)
BUN: 46 mg/dL — ABNORMAL HIGH (ref 8–23)
CO2: 25 mmol/L (ref 22–32)
Calcium: 9.4 mg/dL (ref 8.9–10.3)
Chloride: 100 mmol/L (ref 98–111)
Creatinine, Ser: 1.53 mg/dL — ABNORMAL HIGH (ref 0.61–1.24)
GFR calc Af Amer: 50 mL/min — ABNORMAL LOW (ref >60)
GFR calc non Af Amer: 44 mL/min — ABNORMAL LOW (ref >60)
Glucose, Bld: 118 mg/dL — ABNORMAL HIGH (ref 70–99)
Potassium: 4.6 mmol/L (ref 3.5–5.1)
Sodium: 138 mmol/L (ref 135–145)

## 2019-09-13 LAB — POCT INR: INR: 2.3 (ref 2.0–3.0)

## 2019-09-13 MED ORDER — SODIUM CHLORIDE 0.9 % IV SOLN: Freq: Once | INTRAVENOUS | Status: AC

## 2019-09-13 MED ORDER — SODIUM CHLORIDE 0.9 % IV SOLN
510.0000 mg | Freq: Once | INTRAVENOUS | Status: AC
  Administered 2019-09-13: 510 mg via INTRAVENOUS
  Filled 2019-09-13: qty 510

## 2019-09-13 NOTE — Patient Instructions (Signed)
[facility] at [facility] Discharge Instructions  Received Feraheme infusion today. Follow-up as scheduled. Call clinic for any questions or concerns   Thank you for choosing [facility] at [facility] to provide your oncology and hematology care.  To afford each patient quality time with our provider, please arrive at least 15 minutes before your scheduled appointment time.   If you have a lab appointment with the Cancer Center please come in thru the Main Entrance and check in at the main information desk.  You need to re-schedule your appointment should you arrive 10 or more minutes late.  We strive to give you quality time with our providers, and arriving late affects you and other patients whose appointments are after yours.  Also, if you no show three or more times for appointments you may be dismissed from the clinic at the providers discretion.     Again, thank you for choosing [facility].  Our hope is that these requests will decrease the amount of time that you wait before being seen by our physicians.       _____________________________________________________________  Should you have questions after your visit to [facility], please contact our office at [phone] between the hours of 8:00 a.m. and 4:30 p.m.  Voicemails left after 4:00 p.m. will not be returned until the following business day.  For prescription refill requests, have your pharmacy contact our office and allow 72 hours.    Due to Covid, you will need to wear a mask upon entering the hospital. If you do not have a mask, a mask will be given to you at the Main Entrance upon arrival. For doctor visits, patients may have 1 support person with them. For treatment visits, patients can not have anyone with them due to social distancing guidelines and our immunocompromised population.     

## 2019-09-13 NOTE — Progress Notes (Signed)
Christian Rasmussen tolerated Feraheme infusion well without complaints or incident. Peripheral IV site checked with blood return noted prior to and after infusion. VSS upon discharge. Pt discharged via wheelchair in satisfactory condition

## 2019-09-13 NOTE — Patient Instructions (Signed)
Continue warfarin 1 1/2 tablets daily except 1 tablet on Mondays, Wednesdays and Fridays °Recheck in 4 weeks °

## 2019-09-13 NOTE — Progress Notes (Signed)
Virtual Visit via Telephone Note   This visit type was conducted due to national recommendations for restrictions regarding the COVID-19 Pandemic (e.g. social distancing) in an effort to limit this patient's exposure and mitigate transmission in our community.  Due to his co-morbid illnesses, this patient is at least at moderate risk for complications without adequate follow up.  This format is felt to be most appropriate for this patient at this time.  The patient did not have access to video technology/had technical difficulties with video requiring transitioning to audio format only (telephone).  All issues noted in this document were discussed and addressed.  No physical exam could be performed with this format.  Please refer to the patient's chart for his  consent to telehealth for [facility].   The patient was identified using 2 identifiers.  Date:  09/13/2019   ID:  Christian Rasmussen, DOB 11-01-1940, MRN [id number]  Patient Location: Home Provider Location: Office  PCP:  Monico Blitz, MD  Cardiologist:  Kate Sable, MD  Electrophysiologist:  None   Evaluation Performed:  Follow-Up Visit  Chief Complaint:  CHF  History of Present Illness:    Christian Rasmussen is a 79 y.o. male with coronary disease and CABG (4-vessel CABG in June 2017), chronic diastolic heart failure, mitral valve replacement, cardiac pacemaker, and aortic stenosis.  Transesophageal echocardiogram in February 2020 demonstrated mild to moderate aortic stenosis. Previous mitral valve repair noted with moderate mitral stenosis and regurgitation. Moderatelyreduced ejection fraction 50 to 55%.  At his last visit on 08/21/2019 it was learned he had only been taking Lasix 40 mg daily.  I increased the dosage for 5 days and then put him on 40 mg twice daily with supplemental potassium.  Weight has gone down 12 pounds since 08/21/2019.  He received an iron infusion last week and will receive another one today.  In spite  of weight loss, his wife says he still has no energy.  She said that he has improved mentally and is not nearly as irritable.  He continues to experience exertional dyspnea with walking short distances.  He denies exertional chest pain.  Sometimes he forgets to take the second dose of Lasix unless his wife reminds him.   Soc Hx:Alyson Ingles daughter committed suicide on Oct 01, 2016. Her son lives withthe patientand his wife.They also have a granddaughter, Debbrah Alar works at [employer]. The patient's wife's 49 year old mother also lives with them.  Past Medical History:  Diagnosis Date  . 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 Aortic stenosis 10/14/2015  . CAD (coronary artery disease)    status post Taxus stent patency of RCA 2004, Cardiolite negative for ischemia in 2010.  . Diabetes mellitus ([place])   . Dyslipidemia   . History of kidney stones   . Hypertension   . Iron deficiency anemia 02/02/2018  . Mitral regurgitation 10/15/2015   Moderate-severe by intra-operative TEE  . Overweight   . Right bundle branch block (RBBB) with left anterior hemiblock   . 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 Snores   . Typical atrial flutter [facility])    s/p ablation 11-23-2012 by Dr Rayann Heman   Past  Surgical History:  Procedure Laterality Date  . ABLATION  11-23-2012   s/p cavotricuspid isthmus ablation by Dr Rayann Heman  . ANKLE SURGERY Left    total ankle replacement  . ATRIAL FLUTTER ABLATION N/A 11/23/2012   Procedure: ATRIAL FLUTTER ABLATION;  Surgeon: Thompson Grayer, MD;  Location[facility] CATH LAB;  Service: Cardiovascular;  Laterality: N/A;  . CARDIAC CATHETERIZATION N/A  10/13/2015   Procedure: Left Heart Cath and Coronary Angiography;  Surgeon: Peter M Martinique, MD;  Location: [place] CV LAB;  Service: Cardiovascular;  Laterality: N/A;  . CATARACT EXTRACTION Right   . CATARACT EXTRACTION W/PHACO Left 09/13/2013   Procedure: CATARACT EXTRACTION PHACO AND INTRAOCULAR LENS PLACEMENT (IOC);  Surgeon: Tonny Branch, MD;  Location: [facility] ORS;  Service: Ophthalmology;  Laterality: Left;  CDE 9.38  . COLONOSCOPY N/A 10/10/2014   Procedure: COLONOSCOPY;  Surgeon: Rogene Houston, MD;  Location: [facility] ENDO SUITE;  Service: Endoscopy;  Laterality: N/A;  830  . CORONARY ANGIOPLASTY     3 stents  . CORONARY ARTERY BYPASS GRAFT N/A 10/15/2015   Procedure: CORONARY ARTERY BYPASS GRAFTING (CABG) X4 LIMA-LAD; SEQ SVG-PD-PL; SVG-OM1 ENDOSCOPIC GREATER SAPHENOUS VEIN HARVEST(EVH) BILAT LOWER EXTREM;  Surgeon: Rexene Alberts, MD;  Location: [place];  Service: Open Heart Surgery;  Laterality: N/A;  . EP IMPLANTABLE DEVICE N/A 10/20/2015   Procedure: Pacemaker Implant;  Surgeon: Evans Lance, MD;  Location: [place] CV LAB;  Service: Cardiovascular;  Laterality: N/A;  . MITRAL VALVE REPAIR N/A 10/15/2015   Procedure: MITRAL VALVE REPAIR (MVR), # 28 MEMO 3-D RING ANNULOPLASTY AND COMPLEX VALVE REPAIR;  Surgeon: Rexene Alberts, MD;  Location: [place];  Service: Open Heart Surgery;  Laterality: N/A;  . TEE WITHOUT CARDIOVERSION N/A 10/15/2015   Procedure: TRANSESOPHAGEAL ECHOCARDIOGRAM (TEE);  Surgeon: Rexene Alberts, MD;  Location: [place];  Service: Open Heart Surgery;  Laterality: N/A;  . TEE WITHOUT CARDIOVERSION N/A 09/16/2017   Procedure: TRANSESOPHAGEAL ECHOCARDIOGRAM (TEE);  Surgeon: Dorothy Spark, MD;  Location: [facility] ENDOSCOPY;  Service: Cardiovascular;  Laterality: N/A;  . TEE WITHOUT CARDIOVERSION N/A 06/26/2018   Procedure: TRANSESOPHAGEAL ECHOCARDIOGRAM (TEE);  Surgeon: [name], MD;  Location: [place];  Service: Cardiology;  Laterality: N/A;     Current Meds  Medication  Sig  . aspirin EC 81 MG tablet Take 1 tablet (81 mg total) by mouth daily. (Patient taking differently: Take 81 mg by mouth every evening. )  . ASTAXANTHIN PO Take 10 mg by mouth daily.   Marland Kitchen atorvastatin (LIPITOR) 40 MG tablet Take 40 mg by mouth daily.   . carvedilol (COREG) 12.5 MG tablet TAKE 1 & 1/2 (ONE & ONE-HALF) TABLETS BY MOUTH TWICE DAILY  . Coenzyme Q10 200 MG capsule Take 200 mg by mouth daily.   . furosemide (LASIX) 40 MG tablet Take 1 tablet (40 mg total) by mouth 2 (two) times daily.  Marland Kitchen lisinopril (ZESTRIL) 5 MG tablet Take 1 tablet (5 mg total) by mouth daily.  . metFORMIN (GLUCOPHAGE) 500 MG tablet Take 1,000 mg by mouth 2 (two) times daily with a meal.   . potassium chloride (KLOR-CON 10) 10 MEQ tablet Take 2 tablets (20 mEq total) by mouth daily.  Marland Kitchen RELION PRIME TEST test strip USE 1 STRIP TO CHECK GLUCOSE ONCE DAILY  . SF 5000 PLUS 1.1 % CREA dental cream Apply 1 application topically See admin instructions. Use 1 application 2 times a week at bedtime.  Marland Kitchen warfarin (COUMADIN) 5 MG tablet Take 1  1/2 tablets daily except 1 tablet on Mondays, Wednesdays and Fridays or as directed     Allergies:   Patient has no known allergies.   Social History   Tobacco Use  . Smoking status: Former Smoker    Packs/day: 1.00    Years: 28.00    Pack years: 28.00    Types: Cigarettes    Start date: 10/21/1956    Quit date: 05/10/1985    Years since quitting: 34.3  . Smokeless tobacco: Never Used  Substance Use Topics  . Alcohol use: Not Currently    Alcohol/week: 0.0 standard drinks    Comment: occasional wine  . Drug use: No     Family Hx: The patient's family history includes Leukemia (age of onset: 53) in his brother; Other in his mother; Other (age of onset: 75) in his father.  ROS:   Please see the history of present illness.     All other systems reviewed and are negative.   Prior CV studies:   The following studies were reviewed today:  Echocardiogram  08/01/2019:  1. Left ventricular ejection fraction, by estimation, is 45 to 50%. The  left ventricle has mildly decreased function. The left ventricle  demonstrates global hypokinesis. Left ventricular diastolic parameters are  indeterminate.  2. Right ventricular systolic function is moderately reduced. The right  ventricular size is moderately enlarged. There is moderately elevated  pulmonary artery systolic pressure.  3. History of MV repair with 28 mm Sorin Memo 3D ring annuloplasty . The  mitral valve has been repaired/replaced. No evidence of mitral valve  regurgitation. Mild to moderate mitral stenosis.  4. TR appears moderate by color Doppler. There is hepatic systolic flow  reversal suggesting more severe TR. . Tricuspid valve regurgitation is  moderate.  5. The aortic valve has an indeterminant number of cusps. Aortic valve  regurgitation is not visualized. Moderate aortic valve stenosis.  6. Moderate pulmonary HTN, PASP is 64 mmHg.  7. The inferior vena cava is dilated in size with <50% respiratory  variability, suggesting right atrial pressure of 15 mmHg.     Labs/Other Tests and Data Reviewed:    EKG:  No ECG reviewed.  Recent Labs: 08/29/2019: ALT 15; BUN 45; Creatinine, Ser 1.42; Hemoglobin 12.6; Platelets 191; Potassium 4.2; Sodium 142   Recent Lipid Panel Lab Results  Component Value Date/Time   CHOL 108 10/14/2015 04:31 AM   TRIG 157 (H) 10/14/2015 04:31 AM   HDL 34 (L) 10/14/2015 04:31 AM   CHOLHDL 3.2 10/14/2015 04:31 AM   LDLCALC 43 10/14/2015 04:31 AM    Wt Readings from Last 3 Encounters:  09/13/19 215 lb (97.5 kg)  08/30/19 215 lb (97.5 kg)  08/21/19 227 lb (103 kg)     Objective:    Vital Signs:  BP (!) 110/54   Pulse 66   Ht 5' 11.5" (1.816 m)   Wt 215 lb (97.5 kg)   BMI 29.57 kg/m    VITAL SIGNS:  reviewed  ASSESSMENT & PLAN:    1. CAD s/p 4-v CABG(June 2017)/exertional dyspnea: He continues to experience exertional dyspnea  which I believe is multifactorial in etiology.  I will obtain a Lexiscan Myoview to rule out an ischemic etiology.Continue aspirin, Lipitor, Coreg, and lisinopril.  I would consider the addition of Imdur.  2. S/p mitral valve repair: Mild to moderatestenosis by echocardiogram on 08/01/2019.  3. S/p PPM for CHB: Stable.Device is functioning normally. F/u with Dr. Lovena Le.  4. Essential [id number] pressure is normal.  No changes to therapy.  5. Hyperlipidemia:ContinueLipitor.  6.  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    Weight is down 12 pounds since last visit with me on 08/21/2019.    Currently on Lasix 40 mg twice daily with supplemental potassium.  I will check a basic metabolic panel.  Currently on carvedilol and lisinopril.  He has NYHA class III symptoms.  I will obtain a Lexiscan Myoview to rule out an ischemic etiology.  I would consider the addition of nitrates. He is scheduled to see Dr. Aundra Dubin in the advanced heart failure clinic later this month. I asked him to weigh himself daily.  7. Atrial flutter:Anticoagulated withwarfarin.  8. Aortic stenosis:Moderate aortic stenosis by echocardiogram on 08/01/2019.  I will monitor.  9. Obstructive sleep apnea: Encouraged to use CPAP daily.  10.  Pulmonary hypertension: Pulmonary pressures are 64 mmHg.  There is likely moderate to severe tricuspid regurgitation with RV enlargement and dysfunction.  Currently on Lasix 40 mg twice daily.  He has a pacemaker lead across the tricuspid valve which is contributing to valvular pathology.  11.  Acute kidney injury: Creatinine 1.42 on 08/29/2019 due to increased diuretic requirement.  I will repeat a basic metabolic panel.      COVID-19 Education: The signs and symptoms of COVID-19 were discussed with the patient  and how to seek care for testing (follow up with PCP or arrange E-visit).  The importance of social distancing was discussed today.  Time:   Today, I have spent 25 minutes with the patient with telehealth technology discussing the above problems.     Medication Adjustments/Labs and Tests Ordered: Current medicines are reviewed at length with the patient today.  Concerns regarding medicines are outlined above.   Tests Ordered: No orders of the defined types were placed in this encounter.   Medication Changes: No orders of the defined types were placed in this encounter.   Follow Up:  Virtual Visit  2 months.  Follow-up in advanced heart failure clinic as previously scheduled.  Signed, Kate Sable, MD  09/13/2019 8:54 AM    [facility]

## 2019-09-13 NOTE — Addendum Note (Signed)
Addended by: [name] on: 09/13/2019 02:18 PM   Modules accepted: Orders

## 2019-09-13 NOTE — Patient Instructions (Signed)
Medication Instructions:  Continue all current medications.  Labwork:  BMET - order was sent to [facility] on 09/13/2019 for you to do while already there.   Office will contact with results via phone or letter.    (This was not a stat lab, so could have been done anytime.)  Testing/Procedures: Your physician has requested that you have a lexiscan myoview. For further information please visit [email or web address]. Please follow instruction sheet, as given. Office will contact with results via phone or letter.    Follow-Up: 2 months   Any Other Special Instructions Will Be Listed Below (If Applicable).  If you need a refill on your cardiac medications before your next appointment, please call your pharmacy.

## 2019-09-13 NOTE — Telephone Encounter (Signed)
Pre-cert Verification for the following procedure    LEXISCAN   DATE:   09/26/2019  LOCATION: [facility]

## 2019-09-14 ENCOUNTER — Telehealth: Payer: Self-pay | Admitting: *Deleted

## 2019-09-14 NOTE — Telephone Encounter (Signed)
-----   Message from Herminio Commons, MD sent at 09/13/2019  6:05 PM EDT ----- Renal dysfunction indicative of increased diuretic requirement. Will need to allow to some degree in order to adequately diurese.

## 2019-09-14 NOTE — Telephone Encounter (Signed)
Christian Rasmussen, [place]  [id number] PM EDT    Patient notified. Copy to pcp.

## 2019-09-18 ENCOUNTER — Other Ambulatory Visit (HOSPITAL_COMMUNITY): Payer: Medicare Other

## 2019-09-25 ENCOUNTER — Ambulatory Visit (HOSPITAL_COMMUNITY)
Admission: RE | Admit: 2019-09-25 | Discharge: 2019-09-25 | Disposition: A | Discharge status: Home / Self Care | Payer: Medicare Other | Source: Ambulatory Visit | Attending: Nurse Practitioner | Admitting: Nurse Practitioner

## 2019-09-25 ENCOUNTER — Other Ambulatory Visit: Payer: Self-pay

## 2019-09-25 ENCOUNTER — Encounter (INDEPENDENT_AMBULATORY_CARE_PROVIDER_SITE_OTHER): Payer: Self-pay | Admitting: *Deleted

## 2019-09-25 DIAGNOSIS — R911 Solitary pulmonary nodule: Secondary | ICD-10-CM | POA: Insufficient documentation

## 2019-09-25 DIAGNOSIS — J90 Pleural effusion, not elsewhere classified: Secondary | ICD-10-CM | POA: Diagnosis not present

## 2019-09-25 DIAGNOSIS — R918 Other nonspecific abnormal finding of lung field: Secondary | ICD-10-CM | POA: Diagnosis not present

## 2019-09-25 MED ORDER — IOHEXOL 300 MG/ML  SOLN
75.0000 mL | Freq: Once | INTRAMUSCULAR | Status: AC | PRN
  Administered 2019-09-25: 60 mL via INTRAVENOUS

## 2019-09-26 ENCOUNTER — Encounter (HOSPITAL_COMMUNITY)
Admission: RE | Admit: 2019-09-26 | Discharge: 2019-09-26 | Disposition: A | Discharge status: Home / Self Care | Payer: Medicare Other | Source: Ambulatory Visit | Attending: Cardiovascular Disease | Admitting: Cardiovascular Disease

## 2019-09-26 ENCOUNTER — Encounter (HOSPITAL_COMMUNITY): Payer: Medicare Other

## 2019-09-26 DIAGNOSIS — R0609 Other forms of dyspnea: Secondary | ICD-10-CM

## 2019-09-26 DIAGNOSIS — Z951 Presence of aortocoronary bypass graft: Secondary | ICD-10-CM

## 2019-09-26 DIAGNOSIS — R0600 Dyspnea, unspecified: Secondary | ICD-10-CM | POA: Diagnosis not present

## 2019-09-26 LAB — NM MYOCAR MULTI W/SPECT W/WALL MOTION / EF
LV dias vol: 95 mL (ref 62–150)
LV sys vol: 48 mL
Peak HR: 81 {beats}/min
RATE: 0.42
Rest HR: 57 {beats}/min
SDS: 1
SRS: 7
SSS: 8
TID: 0.95

## 2019-09-26 MED ORDER — TECHNETIUM TC 99M TETROFOSMIN IV KIT
10.0000 | PACK | Freq: Once | INTRAVENOUS | Status: AC | PRN
  Administered 2019-09-26: 9.9 via INTRAVENOUS

## 2019-09-26 MED ORDER — SODIUM CHLORIDE FLUSH 0.9 % IV SOLN
INTRAVENOUS | Status: AC
  Administered 2019-09-26: 10 mL via INTRAVENOUS
  Filled 2019-09-26: qty 10

## 2019-09-26 MED ORDER — TECHNETIUM TC 99M TETROFOSMIN IV KIT
30.0000 | PACK | Freq: Once | INTRAVENOUS | Status: AC | PRN
  Administered 2019-09-26: 30 via INTRAVENOUS

## 2019-09-26 MED ORDER — REGADENOSON 0.4 MG/5ML IV SOLN
INTRAVENOUS | Status: AC
  Administered 2019-09-26: 0.4 mg via INTRAVENOUS
  Filled 2019-09-26: qty 5

## 2019-09-27 ENCOUNTER — Telehealth: Payer: Self-pay | Admitting: *Deleted

## 2019-09-27 MED ORDER — ISOSORBIDE MONONITRATE ER 30 MG PO TB24
30.0000 mg | ORAL_TABLET | Freq: Every day | ORAL | 6 refills | Status: DC
Start: 2019-09-27 — End: 2019-10-09

## 2019-09-27 NOTE — Telephone Encounter (Signed)
[name], [place]  [id number] PM EDT    Patient notified. Copy to pcp. Will send new medication to [facility] now.

## 2019-09-27 NOTE — Telephone Encounter (Signed)
-----   Message from Herminio Commons, MD sent at 09/26/2019  2:35 PM EDT ----- Low risk study.  Evidence for prior heart attack with very small blockage.  I will manage with medications.  Start Imdur 30 mg daily to see if this helps improve shortness of breath.

## 2019-09-28 ENCOUNTER — Encounter (HOSPITAL_COMMUNITY): Payer: Medicare Other | Admitting: Cardiology

## 2019-10-01 ENCOUNTER — Other Ambulatory Visit (HOSPITAL_COMMUNITY): Payer: Self-pay | Admitting: Cardiology

## 2019-10-01 ENCOUNTER — Ambulatory Visit (HOSPITAL_COMMUNITY)
Admission: RE | Admit: 2019-10-01 | Discharge: 2019-10-01 | Disposition: A | Discharge status: Home / Self Care | Payer: Medicare Other | Source: Ambulatory Visit | Attending: Cardiology | Admitting: Cardiology

## 2019-10-01 ENCOUNTER — Encounter (HOSPITAL_COMMUNITY): Payer: Self-pay | Admitting: Cardiology

## 2019-10-01 ENCOUNTER — Other Ambulatory Visit: Payer: Self-pay

## 2019-10-01 VITALS — BP 116/66 | HR 66 | Wt 222.6 lb

## 2019-10-01 DIAGNOSIS — I2510 Atherosclerotic heart disease of native coronary artery without angina pectoris: Secondary | ICD-10-CM | POA: Insufficient documentation

## 2019-10-01 DIAGNOSIS — Z9889 Other specified postprocedural states: Secondary | ICD-10-CM | POA: Diagnosis not present

## 2019-10-01 DIAGNOSIS — I442 Atrioventricular block, complete: Secondary | ICD-10-CM | POA: Insufficient documentation

## 2019-10-01 DIAGNOSIS — I252 Old myocardial infarction: Secondary | ICD-10-CM | POA: Insufficient documentation

## 2019-10-01 DIAGNOSIS — G4733 Obstructive sleep apnea (adult) (pediatric): Secondary | ICD-10-CM | POA: Insufficient documentation

## 2019-10-01 DIAGNOSIS — I5032 Chronic diastolic (congestive) heart failure: Secondary | ICD-10-CM

## 2019-10-01 DIAGNOSIS — E1122 Type 2 diabetes mellitus with diabetic chronic kidney disease: Secondary | ICD-10-CM | POA: Diagnosis not present

## 2019-10-01 DIAGNOSIS — N183 Chronic kidney disease, stage 3 unspecified: Secondary | ICD-10-CM | POA: Insufficient documentation

## 2019-10-01 DIAGNOSIS — Z7901 Long term (current) use of anticoagulants: Secondary | ICD-10-CM | POA: Diagnosis not present

## 2019-10-01 DIAGNOSIS — R188 Other ascites: Secondary | ICD-10-CM | POA: Diagnosis not present

## 2019-10-01 DIAGNOSIS — I4891 Unspecified atrial fibrillation: Secondary | ICD-10-CM | POA: Insufficient documentation

## 2019-10-01 DIAGNOSIS — Z951 Presence of aortocoronary bypass graft: Secondary | ICD-10-CM | POA: Diagnosis not present

## 2019-10-01 DIAGNOSIS — Z950 Presence of cardiac pacemaker: Secondary | ICD-10-CM | POA: Insufficient documentation

## 2019-10-01 DIAGNOSIS — Z79899 Other long term (current) drug therapy: Secondary | ICD-10-CM | POA: Diagnosis not present

## 2019-10-01 DIAGNOSIS — I350 Nonrheumatic aortic (valve) stenosis: Secondary | ICD-10-CM | POA: Insufficient documentation

## 2019-10-01 DIAGNOSIS — E785 Hyperlipidemia, unspecified: Secondary | ICD-10-CM | POA: Insufficient documentation

## 2019-10-01 DIAGNOSIS — I25708 Atherosclerosis of coronary artery bypass graft(s), unspecified, with other forms of angina pectoris: Secondary | ICD-10-CM | POA: Diagnosis not present

## 2019-10-01 LAB — BASIC METABOLIC PANEL
Anion gap: 10 (ref 5–15)
BUN: 40 mg/dL — ABNORMAL HIGH (ref 8–23)
CO2: 22 mmol/L (ref 22–32)
Calcium: 9.1 mg/dL (ref 8.9–10.3)
Chloride: 106 mmol/L (ref 98–111)
Creatinine, Ser: 1.63 mg/dL — ABNORMAL HIGH (ref 0.61–1.24)
GFR calc Af Amer: 47 mL/min — ABNORMAL LOW (ref >60)
GFR calc non Af Amer: 40 mL/min — ABNORMAL LOW (ref >60)
Glucose, Bld: 146 mg/dL — ABNORMAL HIGH (ref 70–99)
Potassium: 4.3 mmol/L (ref 3.5–5.1)
Sodium: 138 mmol/L (ref 135–145)

## 2019-10-01 MED ORDER — METOLAZONE 2.5 MG PO TABS
ORAL_TABLET | ORAL | 5 refills | Status: DC
Start: 2019-10-01 — End: 2019-10-15

## 2019-10-01 MED ORDER — TORSEMIDE 20 MG PO TABS: 40.0000 mg | ORAL_TABLET | Freq: Two times a day (BID) | ORAL | 3 refills | Status: AC

## 2019-10-01 NOTE — Patient Instructions (Addendum)
STOP Lasix  START Torsemide 40mg  (2 tabs) twice a day  TAKE Metolazone 2.5mg  (1 tab) TOMORROW with morning dose of torsemide.  We sent in extra tablets only to be taken as directed by Heart Failure. DO NOT TAKE THIS MEDICATION if not instructed by heart failure team to do so.   TAKE an extra Potassium 55meq (1 tab) TOMORROW ONLY (with Metolazone)  Labs today We will only contact you if something comes back abnormal or we need to make some changes. Otherwise no news is good news!  You will be contacted to schedule an appointment with IR for a paracentesis.  Your physician recommends that you schedule a follow-up appointment in: 1 week with Dr Aundra Dubin  Please call office at [phone] option 2 if you have any questions or concerns.   At the [facility], you and your health needs are our priority. As part of our continuing mission to provide you with exceptional heart care, we have created designated Provider Care Teams. These Care Teams include your primary Cardiologist (physician) and Advanced Practice Providers (APPs- Physician Assistants and Nurse Practitioners) who all work together to provide you with the care you need, when you need it.   You may see any of the following providers on your designated Care Team at your next follow up: Marland Kitchen Dr Glori Bickers . Dr Loralie Champagne . Darrick Grinder, NP . Lyda Jester, PA . Audry Riles, PharmD   Please be sure to bring in all your medications bottles to every appointment.

## 2019-10-01 NOTE — Progress Notes (Signed)
PCP: Monico Blitz, MD Cardiology: Dr. Jacinta Shoe EP: Dr Lovena Le HF Cardiology: Dr. Aundra Dubin  79 y.o. with history of CAD s/p CABG and MV repair in 6/17, complete heart block s/p PPM, heart failure with mid-range EF, moderate aortic stenosis, CKD stage 3 was referred by Dr. Lovena Le for evaluation of CHF.  Patient has an extensive past medical history.  He had CABG and mitral valve repair in 6/17.  He has complete heart block with [employer] PPM, and he chronically RV paces.  He has had exertional dyspnea and volume overload for months.  Currently, he is short of breath walking < 50 feet.  He is very short of breath with stairs.  He is short of breath getting dressed.  Poor appetite.  No orthopnea/PND but wears CPAP at night.  In 4/21, Lasix was increased to 40 mg bid but he is still very symptomatic and has peripheral edema and abdominal distention.  He has gained 7 lbs in the last couple of weeks.  Last echo in 3/21 showed EF 45-50%, moderately decreased RV systolic function, s/p MV repair with no MR but mild-moderate functional mitral stenosis, moderate AS, and mod-severe TR.  He had a Cardiolite in 5/21 that showed EF 50%, inferior MI, minimal ischemia. No chest pain.   ECG (personally reviewed): A-V sequential pacing  Labs (5/21): K 4.6, creatinine 1.53   PMH: 1. CAD: Taxus DES to RCA in 2004.  - CABG x 4 with LIMA-LAD, SVG-OM, sequential SVG-PDA/PLV in 6/17.  - Cardiolite (5/21) with EF 50%, old inferior MI with minimal peri-infarct ischemia.  2. Heart failure with mid-range EF: Echo in 3/21 with EF 45-50%, moderately dilated RV with moderately decreased systolic function, s/p MV repair wiith no MR and mild to moderate mitral stenosis (mean gradient 6 mmHg), moderate-severe TR, moderate AS (mean gradient 13 but AVA 1.16 cm^2), PASP 64 mmHg, IVC dilated.  3. S/p mitral valve repair in 6/17 with CABG.  4. Fe deficiency anemia.  5. Type 2 diabetes 6. Hyperlipidemia 7. Nephrolithiasis 8.  Atrial arrhythmias: Atrial fibrillation, atrial flutter.   S/p flutter ablation in 7/14.  9. OSA: Using CPAP 10. Complete heart block: [employer] PPM.  11. Tricuspid regurgitation: Moderate-severe on 3/21 echo, PPM across TV may play a role.  12. Aortic stenosis: Probably moderate on 3/21 echo.  57. 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   Family History  Problem Relation Age of Onset  . Other Father 28       Died from MI.  . Other Mother        alive & well  . Leukemia Brother 48       died   ROS: All systems reviewed and negative except as per HPI.   BP 116/66   Pulse 66   Wt 101 kg (222 lb 9.6 oz)   SpO2 94%   BMI 30.61 kg/m  General: NAD Neck: JVP 14+ cm, no thyromegaly or thyroid nodule.  Lungs: Clear to auscultation bilaterally with normal respiratory effort. CV: Nondisplaced PMI.  Heart regular S1/S2, no S3/S4, 2/6 SEM RUSB.  2+ edema to knees bilaterally.  No carotid bruit.  Normal pedal pulses.  Abdomen: Soft, nontender, no hepatosplenomegaly, moderate distention.  Skin: Intact without lesions or rashes.  Neurologic: Alert and oriented x 3.  Psych: Normal affect. Extremities: No clubbing or cyanosis.  HEENT: Normal.   Assessment/Plan: 1. HF with mid-range EF: Last echo in 3/21 showed EF 45-50%, moderately  decreased RV systolic function, s/p MV repair with no MR but mild-moderate functional mitral stenosis, moderate AS, and mod-severe TR.  He likely has multifactorial CHF with valvular dysfunction and chronic RV pacing with dyssynchrony also playing a role. I do not think that ischemia is playing a major role here.  On exam, he is markedly volume overloaded.  NYHA class IIIb symptoms.  - Stop Lasix, start torsemide 40 mg bid.   - He will take a dose of metolazone 2.5 x 1 with morning torsemide tomorrow.   - BMET today and in 10 days.  - Will discuss with Dr. Lovena Le whether there would be a role for CRT upgrade here.   - Given abdominal distention, I sent him today for ultrasound of the abdomen with plan for paracentesis if there is significant ascites => [place] did not show significant ascites.  2. CAD: s/p CABG in 6/17.  Cardiolite in 5/21 with inferior MI, minimal ischemia.  I do not think that ischemia is playing much of a role here, no chest pain.  - He can stop Imdur (thinks that it makes him feel worse).  - Continue warfarin, I think that he can stop ASA 81 given warfarin use, will discuss with him at next appt.   - Continue atorvastatin 40 mg daily, check lipids today.  3. CKD: Stage 3, follow closely with diuresis.  4. Aortic stenosis: Moderate.  Follow over time.  5. S/p MV repair: Most recent echo in 3/21 showed no regurgitation, mild-moderate functional stenosis with mean gradient 6 mmHg.  6. Atrial arrhythmias: Flutter, fibrillation.  He had flutter ablation.  He is in NSR today.  - Continue warfarin.  7. Complete heart block: S/p [employer] PPM.  He is chronically RV-paced.  I am concerned that this plays a role in his significant CHF.  - Will ask Dr. Lovena Le about CRT upgrade.  8. OSA: Continue CPAP.   Loralie Champagne 10/01/2019

## 2019-10-01 NOTE — Progress Notes (Signed)
Patient presents to Radiology today for paracentesis based on recent CT scan.   Limited US Abdomen shows a small pocket of fluid confined to the perihepatic space. After discussion with patient, opted not to proceed with procedure today. Images saved for review if needed.   Brynda Greathouse, MS RD PA-C 2:15 PM

## 2019-10-04 ENCOUNTER — Inpatient Hospital Stay (HOSPITAL_COMMUNITY): Payer: Medicare Other

## 2019-10-04 DIAGNOSIS — D508 Other iron deficiency anemias: Secondary | ICD-10-CM

## 2019-10-04 DIAGNOSIS — D509 Iron deficiency anemia, unspecified: Secondary | ICD-10-CM | POA: Diagnosis not present

## 2019-10-04 LAB — COMPREHENSIVE METABOLIC PANEL
ALT: 14 U/L (ref 0–44)
AST: 15 U/L (ref 15–41)
Albumin: 3.4 g/dL — ABNORMAL LOW (ref 3.5–5.0)
Alkaline Phosphatase: 159 U/L — ABNORMAL HIGH (ref 38–126)
Anion gap: 15 (ref 5–15)
BUN: 60 mg/dL — ABNORMAL HIGH (ref 8–23)
CO2: 27 mmol/L (ref 22–32)
Calcium: 9.4 mg/dL (ref 8.9–10.3)
Chloride: 98 mmol/L (ref 98–111)
Creatinine, Ser: 2.22 mg/dL — ABNORMAL HIGH (ref 0.61–1.24)
GFR calc Af Amer: 32 mL/min — ABNORMAL LOW (ref >60)
GFR calc non Af Amer: 28 mL/min — ABNORMAL LOW (ref >60)
Glucose, Bld: 207 mg/dL — ABNORMAL HIGH (ref 70–99)
Potassium: 4.3 mmol/L (ref 3.5–5.1)
Sodium: 140 mmol/L (ref 135–145)
Total Bilirubin: 1.5 mg/dL — ABNORMAL HIGH (ref 0.3–1.2)
Total Protein: 7 g/dL (ref 6.5–8.1)

## 2019-10-04 LAB — CBC WITH DIFFERENTIAL/PLATELET
Abs Immature Granulocytes: 0.02 10*3/uL (ref 0.00–0.07)
Basophils Absolute: 0 10*3/uL (ref 0.0–0.1)
Basophils Relative: 1 %
Eosinophils Absolute: 0.1 10*3/uL (ref 0.0–0.5)
Eosinophils Relative: 2 %
HCT: 38.1 % — ABNORMAL LOW (ref 39.0–52.0)
Hemoglobin: 11.9 g/dL — ABNORMAL LOW (ref 13.0–17.0)
Immature Granulocytes: 0 %
Lymphocytes Relative: 8 %
Lymphs Abs: 0.5 10*3/uL — ABNORMAL LOW (ref 0.7–4.0)
MCH: 30.1 pg (ref 26.0–34.0)
MCHC: 31.2 g/dL (ref 30.0–36.0)
MCV: 96.5 fL (ref 80.0–100.0)
Monocytes Absolute: 0.5 10*3/uL (ref 0.1–1.0)
Monocytes Relative: 8 %
Neutro Abs: 5.4 10*3/uL (ref 1.7–7.7)
Neutrophils Relative %: 81 %
Platelets: 221 10*3/uL (ref 150–400)
RBC: 3.95 MIL/uL — ABNORMAL LOW (ref 4.22–5.81)
RDW: 18.6 % — ABNORMAL HIGH (ref 11.5–15.5)
WBC: 6.6 10*3/uL (ref 4.0–10.5)
nRBC: 0 % (ref 0.0–0.2)

## 2019-10-04 LAB — FERRITIN: Ferritin: 279 ng/mL (ref 24–336)

## 2019-10-04 LAB — IRON AND TIBC
Iron: 51 ug/dL (ref 45–182)
Saturation Ratios: 16 % — ABNORMAL LOW (ref 17.9–39.5)
TIBC: 311 ug/dL (ref 250–450)
UIBC: 260 ug/dL

## 2019-10-05 ENCOUNTER — Other Ambulatory Visit (HOSPITAL_COMMUNITY): Payer: Medicare Other

## 2019-10-09 ENCOUNTER — Other Ambulatory Visit: Payer: Self-pay

## 2019-10-09 ENCOUNTER — Ambulatory Visit (HOSPITAL_COMMUNITY)
Admission: RE | Admit: 2019-10-09 | Discharge: 2019-10-09 | Disposition: A | Discharge status: Home / Self Care | Payer: Medicare Other | Source: Ambulatory Visit | Attending: Cardiology | Admitting: Cardiology

## 2019-10-09 ENCOUNTER — Encounter (HOSPITAL_COMMUNITY): Payer: Medicare Other | Admitting: Cardiology

## 2019-10-09 ENCOUNTER — Encounter (HOSPITAL_COMMUNITY): Payer: Self-pay | Admitting: Cardiology

## 2019-10-09 VITALS — BP 120/56 | HR 74 | Wt 211.8 lb

## 2019-10-09 DIAGNOSIS — I5032 Chronic diastolic (congestive) heart failure: Secondary | ICD-10-CM

## 2019-10-09 DIAGNOSIS — I083 Combined rheumatic disorders of mitral, aortic and tricuspid valves: Secondary | ICD-10-CM | POA: Insufficient documentation

## 2019-10-09 DIAGNOSIS — Z955 Presence of coronary angioplasty implant and graft: Secondary | ICD-10-CM | POA: Insufficient documentation

## 2019-10-09 DIAGNOSIS — Z87442 Personal history of urinary calculi: Secondary | ICD-10-CM | POA: Diagnosis not present

## 2019-10-09 DIAGNOSIS — I5020 Unspecified systolic (congestive) heart failure: Secondary | ICD-10-CM | POA: Insufficient documentation

## 2019-10-09 DIAGNOSIS — I25708 Atherosclerosis of coronary artery bypass graft(s), unspecified, with other forms of angina pectoris: Secondary | ICD-10-CM

## 2019-10-09 DIAGNOSIS — Z950 Presence of cardiac pacemaker: Secondary | ICD-10-CM | POA: Insufficient documentation

## 2019-10-09 DIAGNOSIS — N183 Chronic kidney disease, stage 3 unspecified: Secondary | ICD-10-CM | POA: Diagnosis not present

## 2019-10-09 DIAGNOSIS — I4892 Unspecified atrial flutter: Secondary | ICD-10-CM | POA: Insufficient documentation

## 2019-10-09 DIAGNOSIS — I4891 Unspecified atrial fibrillation: Secondary | ICD-10-CM | POA: Diagnosis not present

## 2019-10-09 DIAGNOSIS — I2510 Atherosclerotic heart disease of native coronary artery without angina pectoris: Secondary | ICD-10-CM | POA: Insufficient documentation

## 2019-10-09 DIAGNOSIS — E1122 Type 2 diabetes mellitus with diabetic chronic kidney disease: Secondary | ICD-10-CM | POA: Diagnosis not present

## 2019-10-09 DIAGNOSIS — Z8249 Family history of ischemic heart disease and other diseases of the circulatory system: Secondary | ICD-10-CM | POA: Diagnosis not present

## 2019-10-09 DIAGNOSIS — E785 Hyperlipidemia, unspecified: Secondary | ICD-10-CM | POA: Diagnosis not present

## 2019-10-09 DIAGNOSIS — G4733 Obstructive sleep apnea (adult) (pediatric): Secondary | ICD-10-CM | POA: Insufficient documentation

## 2019-10-09 DIAGNOSIS — I252 Old myocardial infarction: Secondary | ICD-10-CM | POA: Diagnosis not present

## 2019-10-09 DIAGNOSIS — I442 Atrioventricular block, complete: Secondary | ICD-10-CM | POA: Diagnosis not present

## 2019-10-09 DIAGNOSIS — D509 Iron deficiency anemia, unspecified: Secondary | ICD-10-CM | POA: Diagnosis not present

## 2019-10-09 DIAGNOSIS — Z951 Presence of aortocoronary bypass graft: Secondary | ICD-10-CM | POA: Diagnosis not present

## 2019-10-09 LAB — BASIC METABOLIC PANEL
Anion gap: 13 (ref 5–15)
BUN: 76 mg/dL — ABNORMAL HIGH (ref 8–23)
CO2: 27 mmol/L (ref 22–32)
Calcium: 9.7 mg/dL (ref 8.9–10.3)
Chloride: 100 mmol/L (ref 98–111)
Creatinine, Ser: 2.17 mg/dL — ABNORMAL HIGH (ref 0.61–1.24)
GFR calc Af Amer: 33 mL/min — ABNORMAL LOW (ref >60)
GFR calc non Af Amer: 29 mL/min — ABNORMAL LOW (ref >60)
Glucose, Bld: 101 mg/dL — ABNORMAL HIGH (ref 70–99)
Potassium: 4.2 mmol/L (ref 3.5–5.1)
Sodium: 140 mmol/L (ref 135–145)

## 2019-10-09 NOTE — Patient Instructions (Signed)
STOP Lisinopril  STOP Imdur  STOP Aspirin  Labs today We will only contact you if something comes back abnormal or we need to make some changes. Otherwise no news is good news!  Your physician recommends that you schedule a follow-up appointment in: 2 weeks with the Nurse Practitioner  Please call office at [phone] option 2 if you have any questions or concerns.   At the [facility], you and your health needs are our priority. As part of our continuing mission to provide you with exceptional heart care, we have created designated Provider Care Teams. These Care Teams include your primary Cardiologist (physician) and Advanced Practice Providers (APPs- Physician Assistants and Nurse Practitioners) who all work together to provide you with the care you need, when you need it.   You may see any of the following providers on your designated Care Team at your next follow up: Marland Kitchen Dr Glori Bickers . Dr Loralie Champagne . Darrick Grinder, NP . Lyda Jester, PA . Audry Riles, PharmD   Please be sure to bring in all your medications bottles to every appointment.

## 2019-10-10 DIAGNOSIS — I10 Essential (primary) hypertension: Secondary | ICD-10-CM | POA: Diagnosis not present

## 2019-10-10 DIAGNOSIS — K4090 Unilateral inguinal hernia, without obstruction or gangrene, not specified as recurrent: Secondary | ICD-10-CM | POA: Diagnosis not present

## 2019-10-10 DIAGNOSIS — E1165 Type 2 diabetes mellitus with hyperglycemia: Secondary | ICD-10-CM | POA: Diagnosis not present

## 2019-10-10 DIAGNOSIS — I509 Heart failure, unspecified: Secondary | ICD-10-CM | POA: Diagnosis not present

## 2019-10-10 DIAGNOSIS — Z299 Encounter for prophylactic measures, unspecified: Secondary | ICD-10-CM | POA: Diagnosis not present

## 2019-10-10 NOTE — Progress Notes (Signed)
PCP: Monico Blitz, MD Cardiology: Dr. Jacinta Shoe EP: Dr Lovena Le HF Cardiology: Dr. Aundra Dubin  79 y.o. with history of CAD s/p CABG and MV repair in 6/17, complete heart block s/p PPM, heart failure with mid-range EF, moderate aortic stenosis, CKD stage 3 was referred by Dr. Lovena Le for evaluation of CHF.  Patient has an extensive past medical history.  He had CABG and mitral valve repair in 6/17.  He has complete heart block with [employer] PPM, and he chronically RV paces.  He has had exertional dyspnea and volume overload for months.  In 4/21, Lasix was increased to 40 mg bid but he was still very symptomatic with peripheral edema and abdominal distention.  Last echo in 3/21 showed EF 45-50%, moderately decreased RV systolic function, s/p MV repair with no MR but mild-moderate functional mitral stenosis, moderate AS, and mod-severe TR.  He had a Cardiolite in 5/21 that showed EF 50%, inferior MI, minimal ischemia.    At last appointment, Lasix was transitioned to torsemide and he was given 1 dose of metolazone.  Abdominal US did not show enough ascites for paracentesis.   He returns today for followup of CHF.  Currently, he is short of breath walking around 50 feet but thinks that he has made some improvement on with better diuresis.  He is still short of breath with stairs.  He is short of breath getting dressed.  Poor appetite.  No orthopnea/PND but wears CPAP at night.  Weight is down 11 lbs since last appointment.  Last creatinine was up to 2.22.    Labs (5/21): K 4.6, creatinine 1.53 => 2.22  PMH: 1. CAD: Taxus DES to RCA in 2004.  - CABG x 4 with LIMA-LAD, SVG-OM, sequential SVG-PDA/PLV in 6/17.  - Cardiolite (5/21) with EF 50%, old inferior MI with minimal peri-infarct ischemia.  2. Heart failure with mid-range EF: Echo in 3/21 with EF 45-50%, moderately dilated RV with moderately decreased systolic function, s/p MV repair wiith no MR and mild to moderate mitral stenosis (mean gradient 6  mmHg), moderate-severe TR, moderate AS (mean gradient 13 but AVA 1.16 cm^2), PASP 64 mmHg, IVC dilated.  3. S/p mitral valve repair in 6/17 with CABG.  4. Fe deficiency anemia.  5. Type 2 diabetes 6. Hyperlipidemia 7. Nephrolithiasis 8. Atrial arrhythmias: Atrial fibrillation, atrial flutter.   S/p flutter ablation in 7/14.  9. OSA: Using CPAP 10. Complete heart block: [employer] PPM.  11. Tricuspid regurgitation: Moderate-severe on 3/21 echo, PPM across TV may play a role.  12. Aortic stenosis: Probably moderate on 3/21 echo.  44. 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   Family History  Problem Relation Age of Onset  . Other Father 42       Died from MI.  . Other Mother        alive & well  . Leukemia Brother 48       died   ROS: All systems reviewed and negative except as per HPI.   BP (!) 120/56   Pulse 74   Wt 96.1 kg (211 lb 12.8 oz)   SpO2 92%   BMI 29.13 kg/m  General: NAD Neck: JVP 12 cm, no thyromegaly or thyroid nodule.  Lungs: Clear to auscultation bilaterally with normal respiratory effort. CV: Nondisplaced PMI.  Heart regular S1/S2, no S3/S4, 2/6 SEM RUSB with clear S2.  Left leg no significant edema, right leg 2+ edema 1/2 to knee (chronic since  veins removed for CABG).  No carotid bruit.  Normal pedal pulses.  Abdomen: Soft, nontender, no hepatosplenomegaly, no distention.  Skin: Intact without lesions or rashes.  Neurologic: Alert and oriented x 3.  Psych: Normal affect. Extremities: No clubbing or cyanosis.  HEENT: Normal.   Assessment/Plan: 1. HF with mid-range EF: Last echo in 3/21 showed EF 45-50%, moderately decreased RV systolic function, s/p MV repair with no MR but mild-moderate functional mitral stenosis, moderate AS, and mod-severe TR.  He likely has multifactorial CHF with valvular dysfunction and chronic RV pacing with dyssynchrony also playing a role. I do not think that ischemia is playing a  major role here.  Volume status better but still volume overloaded (though TR may make JVP difficult to decipher).  Weight down 11 lbs.  Creatinine up to 2.22.  - Continue torsemide 40 mg bid for now.  - Stop lisinopril for now, may be able to start back lower dose versus Entresto when creatinine stabilizes.   - BMET today.  - Will discuss with Dr. Lovena Le whether there would be a role for CRT upgrade here => will arrange for appointment.  2. CAD: s/p CABG in 6/17.  Cardiolite in 5/21 with inferior MI, minimal ischemia.  I do not think that ischemia is playing much of a role here, no chest pain.  - Continue warfarin, I think that he can stop ASA 81 given warfarin use. - Continue atorvastatin 40 mg daily, check lipids next appt.  3. CKD: Stage 3, follow closely with diuresis.  Concerning that creatinine is rising though he still seems volume overloaded.  4. Aortic stenosis: Moderate.  Follow over time.  5. S/p MV repair: Most recent echo in 3/21 showed no regurgitation, mild-moderate functional stenosis with mean gradient 6 mmHg.  6. Atrial arrhythmias: Flutter, fibrillation.  He had flutter ablation.  He has been in NSR.  - Continue warfarin.  7. Complete heart block: S/p [employer] PPM.  He is chronically RV-paced.  I am concerned that this plays a role in his significant CHF.  - Will ask Dr. Lovena Le about CRT upgrade.  8. OSA: Continue CPAP.   Followup 2 wks with NP/PA.  Followup with Dr. Lovena Le.   Loralie Champagne 10/10/2019

## 2019-10-11 ENCOUNTER — Other Ambulatory Visit: Payer: Self-pay

## 2019-10-11 ENCOUNTER — Ambulatory Visit (INDEPENDENT_AMBULATORY_CARE_PROVIDER_SITE_OTHER): Payer: Medicare Other | Admitting: *Deleted

## 2019-10-11 DIAGNOSIS — I059 Rheumatic mitral valve disease, unspecified: Secondary | ICD-10-CM

## 2019-10-11 DIAGNOSIS — Z01812 Encounter for preprocedural laboratory examination: Secondary | ICD-10-CM | POA: Diagnosis not present

## 2019-10-11 DIAGNOSIS — I350 Nonrheumatic aortic (valve) stenosis: Secondary | ICD-10-CM | POA: Diagnosis not present

## 2019-10-11 DIAGNOSIS — I4892 Unspecified atrial flutter: Secondary | ICD-10-CM

## 2019-10-11 DIAGNOSIS — Z5181 Encounter for therapeutic drug level monitoring: Secondary | ICD-10-CM | POA: Diagnosis not present

## 2019-10-11 DIAGNOSIS — Z9889 Other specified postprocedural states: Secondary | ICD-10-CM | POA: Diagnosis not present

## 2019-10-11 LAB — POCT INR: INR: 2.9 (ref 2.0–3.0)

## 2019-10-11 NOTE — Patient Instructions (Signed)
Continue warfarin 1 1/2 tablets daily except 1 tablet on Mondays, Wednesdays and Fridays °Recheck in 5 weeks °

## 2019-10-12 ENCOUNTER — Ambulatory Visit (HOSPITAL_COMMUNITY): Payer: Medicare Other | Admitting: Hematology

## 2019-10-12 ENCOUNTER — Telehealth (HOSPITAL_COMMUNITY): Payer: Self-pay

## 2019-10-12 NOTE — Telephone Encounter (Signed)
-----   Message from Larey Dresser, MD sent at 10/10/2019 12:48 AM EDT ----- Please arrange appointment for this patient with Dr. Lovena Le to discuss CRT upgrade, can do this soon.

## 2019-10-12 NOTE — Telephone Encounter (Signed)
Per Dr. Aundra Dubin patient needs a follow up with Dr. Lovena Le for to discuss CRT upgrade soon

## 2019-10-12 NOTE — Progress Notes (Signed)
Message was sent to [facility] office to schedule appt for patient

## 2019-10-15 ENCOUNTER — Encounter: Payer: Self-pay | Admitting: Internal Medicine

## 2019-10-15 ENCOUNTER — Ambulatory Visit (INDEPENDENT_AMBULATORY_CARE_PROVIDER_SITE_OTHER): Payer: Medicare Other | Admitting: Internal Medicine

## 2019-10-15 ENCOUNTER — Other Ambulatory Visit: Payer: Self-pay

## 2019-10-15 VITALS — BP 96/52 | HR 73 | Ht 71.5 in | Wt 207.2 lb

## 2019-10-15 DIAGNOSIS — I442 Atrioventricular block, complete: Secondary | ICD-10-CM | POA: Diagnosis not present

## 2019-10-15 DIAGNOSIS — Z950 Presence of cardiac pacemaker: Secondary | ICD-10-CM | POA: Diagnosis not present

## 2019-10-15 DIAGNOSIS — I25708 Atherosclerosis of coronary artery bypass graft(s), unspecified, with other forms of angina pectoris: Secondary | ICD-10-CM

## 2019-10-15 LAB — CUP PACEART INCLINIC DEVICE CHECK
Date Time Interrogation Session: 20210607113057
Implantable Lead Implant Date: 20170612
Implantable Lead Implant Date: 20170612
Implantable Lead Location: 753859
Implantable Lead Location: 753860
Implantable Lead Model: 7741
Implantable Lead Model: 7742
Implantable Lead Serial Number: 733074
Implantable Lead Serial Number: 760165
Implantable Pulse Generator Implant Date: 20170612
Lead Channel Impedance Value: 676 Ohm
Lead Channel Impedance Value: 685 Ohm
Lead Channel Pacing Threshold Amplitude: 0.7 V
Lead Channel Pacing Threshold Amplitude: 1.2 V
Lead Channel Pacing Threshold Pulse Width: 0.4 ms
Lead Channel Pacing Threshold Pulse Width: 0.4 ms
Lead Channel Sensing Intrinsic Amplitude: 10.5 mV
Lead Channel Sensing Intrinsic Amplitude: 3.1 mV
Lead Channel Setting Pacing Amplitude: 2 V
Lead Channel Setting Pacing Amplitude: 2.5 V
Lead Channel Setting Pacing Pulse Width: 0.4 ms
Lead Channel Setting Sensing Sensitivity: 2.5 mV
Pulse Gen Serial Number: 724121

## 2019-10-15 NOTE — Patient Instructions (Signed)
Medication Instructions:  Your physician recommends that you continue on your current medications as directed. Please refer to the Current Medication list given to you today.  *If you need a refill on your cardiac medications before your next appointment, please call your pharmacy*   Lab Work: None today If you have labs (blood work) drawn today and your tests are completely normal, you will receive your results only by: Marland Kitchen MyChart Message (if you have MyChart) OR . A paper copy in the mail If you have any lab test that is abnormal or we need to change your treatment, we will call you to review the results.   Testing/Procedures: None today   Follow-Up: At [facility], you and your health needs are our priority.  As part of our continuing mission to provide you with exceptional heart care, we have created designated Provider Care Teams.  These Care Teams include your primary Cardiologist (physician) and Advanced Practice Providers (APPs -  Physician Assistants and Nurse Practitioners) who all work together to provide you with the care you need, when you need it.  We recommend signing up for the patient portal called "MyChart".  Sign up information is provided on this After Visit Summary.  MyChart is used to connect with patients for Virtual Visits (Telemedicine).  Patients are able to view lab/test results, encounter notes, upcoming appointments, etc.  Non-urgent messages can be sent to your provider as well.   To learn more about what you can do with MyChart, go to [email or web address].    Your next appointment:   4 month(s)  The format for your next appointment:   In Person  Provider:   [name], MD   Other Instructions None       Thank you for choosing [facility] !

## 2019-10-15 NOTE — Progress Notes (Signed)
HPI [name] returns today to discuss upgrade to a biv PPM. He is a pleasant 79 yo man with CHB, s/p MV repair, atrial fib, and pacing induced LBBB. His EF is 45% by echo. He has marked dysynchrony. He has stage 4 chronic renal insufficiency. He has had difficult to control volume status due to his CHF. See Dr. Claris Gladden note for additional evaluation. No Known Allergies   Current Outpatient Medications  Medication Sig Dispense Refill   ASTAXANTHIN PO Take 10 mg by mouth daily.      atorvastatin (LIPITOR) 40 MG tablet Take 40 mg by mouth daily.      carvedilol (COREG) 12.5 MG tablet TAKE 1 & 1/2 (ONE & ONE-HALF) TABLETS BY MOUTH TWICE DAILY 270 tablet 2   Coenzyme Q10 200 MG capsule Take 200 mg by mouth daily.      metFORMIN (GLUCOPHAGE) 500 MG tablet Take 1,000 mg by mouth 2 (two) times daily with a meal.   2   potassium chloride (KLOR-CON 10) 10 MEQ tablet Take 2 tablets (20 mEq total) by mouth daily. 180 tablet 3   RELION PRIME TEST test strip USE 1 STRIP TO CHECK GLUCOSE ONCE DAILY     SF 5000 PLUS 1.1 % CREA dental cream Apply 1 application topically See admin instructions. Use 1 application 2 times a week at bedtime.     torsemide (DEMADEX) 20 MG tablet Take 2 tablets (40 mg total) by mouth 2 (two) times daily. 180 tablet 3   warfarin (COUMADIN) 5 MG tablet Take 1 1/2 tablets daily except 1 tablet on Mondays, Wednesdays and Fridays or as directed 45 tablet 6   No current facility-administered medications for this visit.     Past Medical History:  Diagnosis Date   Aortic insufficiency 10/15/2015   mild (1+/2+) by TEE   Aortic stenosis 10/14/2015   CAD (coronary artery disease)    status post Taxus stent patency of RCA 2004, Cardiolite negative for ischemia in 2010.   Diabetes mellitus ([place])    Dyslipidemia    History of kidney stones    Hypertension    Iron deficiency anemia 02/02/2018   Mitral regurgitation 10/15/2015   Moderate-severe by intra-operative TEE   Overweight      Right bundle branch block (RBBB) with left anterior hemiblock    S/P CABG x 4 10/15/2015   LIMA to LAD, SVG to OM, Sequential SVG to PDA-RPL, EVH via bilateral thighs   S/P mitral valve repair 10/15/2015   Complex valvuloplasty including artificial Gore-tex neochord placement x6, decalcification of posterior annulus, autologous pericardial patch augmentation of posterior leaflet, and 28 mm Sorin Memo 3D ring annuloplasty   Snores    Typical atrial flutter ([place])    s/p ablation 11-23-2012 by Dr Rayann Heman    ROS:   All systems reviewed and negative except as noted in the HPI.   Past Surgical History:  Procedure Laterality Date   ABLATION  11-23-2012   s/p cavotricuspid isthmus ablation by Dr Rayann Heman   ANKLE SURGERY Left    total ankle replacement   ATRIAL FLUTTER ABLATION N/A 11/23/2012   Procedure: ATRIAL FLUTTER ABLATION;  Surgeon: Thompson Grayer, MD;  Location: [facility] CATH LAB;  Service: Cardiovascular;  Laterality: N/A;   CARDIAC CATHETERIZATION N/A 10/13/2015   Procedure: Left Heart Cath and Coronary Angiography;  Surgeon: Peter M Martinique, MD;  Location: [place] CV LAB;  Service: Cardiovascular;  Laterality: N/A;   CATARACT EXTRACTION Right    CATARACT  EXTRACTION W/PHACO Left 09/13/2013   Procedure: CATARACT EXTRACTION PHACO AND INTRAOCULAR LENS PLACEMENT (IOC);  Surgeon: Tonny Branch, MD;  Location: [facility] ORS;  Service: Ophthalmology;  Laterality: Left;  CDE 9.38   COLONOSCOPY N/A 10/10/2014   Procedure: COLONOSCOPY;  Surgeon: Rogene [place], MD;  Location: [facility] ENDO SUITE;  Service: Endoscopy;  Laterality: N/A;  [place]     3 stents   CORONARY ARTERY BYPASS GRAFT N/A 10/15/2015   Procedure: CORONARY ARTERY BYPASS GRAFTING (CABG) X4 LIMA-LAD; SEQ SVG-PD-PL; SVG-OM1 ENDOSCOPIC GREATER SAPHENOUS VEIN HARVEST(EVH) BILAT LOWER EXTREM;  Surgeon: Rexene Alberts, MD;  Location: [place];  Service: Open Heart Surgery;  Laterality: N/A;   EP IMPLANTABLE DEVICE N/A 10/20/2015   Procedure:  Pacemaker Implant;  Surgeon: Evans Lance, MD;  Location: [place] CV LAB;  Service: Cardiovascular;  Laterality: N/A;   MITRAL VALVE REPAIR N/A 10/15/2015   Procedure: MITRAL VALVE REPAIR (MVR), # 10 GYIR 3-D RING ANNULOPLASTY AND COMPLEX VALVE REPAIR;  Surgeon: Rexene Alberts, MD;  Location: [place];  Service: Open Heart Surgery;  Laterality: N/A;   TEE WITHOUT CARDIOVERSION N/A 10/15/2015   Procedure: TRANSESOPHAGEAL ECHOCARDIOGRAM (TEE);  Surgeon: Rexene Alberts, MD;  Location: [place];  Service: Open Heart Surgery;  Laterality: N/A;   TEE WITHOUT CARDIOVERSION N/A 09/16/2017   Procedure: TRANSESOPHAGEAL ECHOCARDIOGRAM (TEE);  Surgeon: Dorothy Spark, MD;  Location: [facility] ENDOSCOPY;  Service: Cardiovascular;  Laterality: N/A;   TEE WITHOUT CARDIOVERSION N/A 06/26/2018   Procedure: TRANSESOPHAGEAL ECHOCARDIOGRAM (TEE);  Surgeon: [name], MD;  Location: [place];  Service: Cardiology;  Laterality: N/A;     Family History  Problem Relation Age of Onset   Other Father 71       Died from MI.   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:    Difficulty of Paying Living Expenses:   Food Insecurity:    Worried About Charity fundraiser in the Last Year:    Arboriculturist in the Last Year:   Transportation Needs:    Film/video editor (Medical):    Lack of Transportation (Non-Medical):   Physical Activity:    Days of Exercise per Week:    Minutes of Exercise per Session:   Stress:    Feeling  of Stress :   Social Connections:    Frequency of Communication with Friends and Family:    Frequency of Social Gatherings with Friends and Family:    Attends Religious Services:    Active Member of Clubs or Organizations:    Attends Music therapist:    Marital Status:   Intimate Partner Violence:    Fear of Current or Ex-Partner:    Emotionally Abused:    Physically Abused:    Sexually Abused:      BP (!) 96/52   Pulse 73   Ht 5' 11.5" (1.816 m)   Wt 207 lb 3.2 oz (94 kg)   SpO2 95%   BMI 28.50 kg/m   Physical Exam:  Well appearing NAD HEENT: Unremarkable Neck:  No JVD, no thyromegally Lymphatics:  No adenopathy Back:  No CVA tenderness Lungs:  Clear with no wheezes HEART:  Regular rate rhythm, 2/6 systolic murmurs, no rubs, no clicks Abd:  soft, positive bowel sounds, no organomegally, no rebound, no guarding Ext:  2 plus pulses, no edema, no cyanosis, no clubbing Skin:  No rashes no nodules Neuro:  CN II through XII intact, motor grossly intact  EKG - reviewed, ventricular pacing with QRS of 170  DEVICE  Normal device function.  See PaceArt for details.   Assess/Plan: 1. Chronic systolic and diastolic heart failure - he has marked dysynchrony. He has class 3 CHF. We have discussed potential upgrade to a biv PPM. I have reviewed the risks and possible benefits of the procedure. We will hold off on this for now. See below  2. PVC's - The patient has over 35% and is pacing only 65%. Regardless of resynchronization,  he will need his PVC's suppressed. We will try amiodarone 200 mg daily.  3. PPM - his medtronic DDD PM is working normally. We will recheck in several months.  4. Chronic renal insufficiency - he has stage 3 renal insufficiency. I am concerned about contrast nephropathy if we proceed with biv upgrade.   [email or web address] [email or web address].

## 2019-10-16 ENCOUNTER — Ambulatory Visit (HOSPITAL_COMMUNITY): Payer: Medicare Other | Admitting: Hematology

## 2019-10-17 DIAGNOSIS — E1165 Type 2 diabetes mellitus with hyperglycemia: Secondary | ICD-10-CM | POA: Diagnosis not present

## 2019-10-17 DIAGNOSIS — M109 Gout, unspecified: Secondary | ICD-10-CM | POA: Diagnosis not present

## 2019-10-17 DIAGNOSIS — Z299 Encounter for prophylactic measures, unspecified: Secondary | ICD-10-CM | POA: Diagnosis not present

## 2019-10-17 DIAGNOSIS — I10 Essential (primary) hypertension: Secondary | ICD-10-CM | POA: Diagnosis not present

## 2019-10-17 DIAGNOSIS — I509 Heart failure, unspecified: Secondary | ICD-10-CM | POA: Diagnosis not present

## 2019-10-20 ENCOUNTER — Emergency Department (HOSPITAL_COMMUNITY)
Admission: EM | Admit: 2019-10-20 | Discharge: 2019-11-08 | Disposition: E | Payer: Medicare Other | Attending: Emergency Medicine | Admitting: Emergency Medicine

## 2019-10-20 DIAGNOSIS — I5031 Acute diastolic (congestive) heart failure: Secondary | ICD-10-CM | POA: Insufficient documentation

## 2019-10-20 DIAGNOSIS — I2510 Atherosclerotic heart disease of native coronary artery without angina pectoris: Secondary | ICD-10-CM | POA: Diagnosis not present

## 2019-10-20 DIAGNOSIS — I469 Cardiac arrest, cause unspecified: Secondary | ICD-10-CM

## 2019-10-20 DIAGNOSIS — Z955 Presence of coronary angioplasty implant and graft: Secondary | ICD-10-CM | POA: Insufficient documentation

## 2019-10-20 DIAGNOSIS — Z87891 Personal history of nicotine dependence: Secondary | ICD-10-CM | POA: Insufficient documentation

## 2019-10-20 DIAGNOSIS — Z79899 Other long term (current) drug therapy: Secondary | ICD-10-CM | POA: Diagnosis not present

## 2019-10-20 DIAGNOSIS — E119 Type 2 diabetes mellitus without complications: Secondary | ICD-10-CM | POA: Diagnosis not present

## 2019-10-20 DIAGNOSIS — I110 Hypertensive heart disease with heart failure: Secondary | ICD-10-CM | POA: Diagnosis not present

## 2019-10-20 DIAGNOSIS — Z7901 Long term (current) use of anticoagulants: Secondary | ICD-10-CM | POA: Diagnosis not present

## 2019-10-20 DIAGNOSIS — I483 Typical atrial flutter: Secondary | ICD-10-CM | POA: Diagnosis not present

## 2019-10-20 DIAGNOSIS — Z7984 Long term (current) use of oral hypoglycemic drugs: Secondary | ICD-10-CM | POA: Insufficient documentation

## 2019-10-20 DIAGNOSIS — Z950 Presence of cardiac pacemaker: Secondary | ICD-10-CM | POA: Diagnosis not present

## 2019-10-22 ENCOUNTER — Telehealth: Payer: Self-pay | Admitting: Internal Medicine

## 2019-10-22 NOTE — Telephone Encounter (Signed)
[facility]ew Message:   Wife called, wanted Dr Lovena Le to know pt died on Saturday(11/04/2019).

## 2019-10-23 ENCOUNTER — Encounter (HOSPITAL_COMMUNITY): Payer: Medicare Other

## 2019-11-06 ENCOUNTER — Other Ambulatory Visit (HOSPITAL_COMMUNITY): Payer: Medicare Other

## 2019-11-07 ENCOUNTER — Encounter: Payer: Medicare Other | Admitting: Internal Medicine
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
## 2019-11-08 NOTE — ED Notes (Signed)
Family came to pick up patient belongings.

## 2019-11-08 NOTE — ED Notes (Signed)
Shirt, jeans and underwear were cut, removed here and trashed.  Cell phone and wallet were picked up by family earlier.  Shoes, socks, suspenders and key sets bagged and labeled for family to pick up.
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
## 2019-11-08 DEATH — deceased

## 2019-11-13 ENCOUNTER — Ambulatory Visit (HOSPITAL_COMMUNITY): Payer: Medicare Other | Admitting: Hematology

## 2019-12-20 ENCOUNTER — Ambulatory Visit: Payer: Medicare Other | Admitting: Cardiovascular Disease

## 2020-02-27 ENCOUNTER — Ambulatory Visit: Payer: Medicare Other | Admitting: Internal Medicine
